# Patient Record
Sex: Male | Born: 1940 | Race: Black or African American | Hispanic: No | Marital: Married | Smoking: Former smoker
Health system: Southern US, Community
[De-identification: ages and names within clinical notes are randomized; demographics above are authoritative.]

## PROBLEM LIST (undated history)

## (undated) DIAGNOSIS — H269 Unspecified cataract: Secondary | ICD-10-CM

## (undated) DIAGNOSIS — D649 Anemia, unspecified: Secondary | ICD-10-CM

## (undated) DIAGNOSIS — R569 Unspecified convulsions: Secondary | ICD-10-CM

## (undated) DIAGNOSIS — D125 Benign neoplasm of sigmoid colon: Secondary | ICD-10-CM

## (undated) DIAGNOSIS — T7840XA Allergy, unspecified, initial encounter: Secondary | ICD-10-CM

## (undated) DIAGNOSIS — M1990 Unspecified osteoarthritis, unspecified site: Secondary | ICD-10-CM

## (undated) DIAGNOSIS — I10 Essential (primary) hypertension: Secondary | ICD-10-CM

## (undated) HISTORY — DX: Allergy, unspecified, initial encounter: T78.40XA

## (undated) HISTORY — PX: EYE SURGERY: SHX253

## (undated) HISTORY — DX: Unspecified convulsions: R56.9

## (undated) HISTORY — PX: GLAUCOMA REPAIR: SHX214

## (undated) HISTORY — DX: Benign neoplasm of sigmoid colon: D12.5

## (undated) HISTORY — DX: Unspecified cataract: H26.9

## (undated) HISTORY — DX: Anemia, unspecified: D64.9

## (undated) HISTORY — PX: CATARACT EXTRACTION, BILATERAL: SHX1313

## (undated) HISTORY — DX: Essential (primary) hypertension: I10

---

## 1997-08-07 ENCOUNTER — Ambulatory Visit (HOSPITAL_COMMUNITY): Admission: RE | Admit: 1997-08-07 | Discharge: 1997-08-07 | Payer: Self-pay | Admitting: *Deleted

## 2000-04-14 ENCOUNTER — Emergency Department (HOSPITAL_COMMUNITY): Admission: EM | Admit: 2000-04-14 | Discharge: 2000-04-14 | Payer: Self-pay | Admitting: Emergency Medicine

## 2003-07-06 ENCOUNTER — Emergency Department (HOSPITAL_COMMUNITY): Admission: EM | Admit: 2003-07-06 | Discharge: 2003-07-06 | Payer: Self-pay | Admitting: Family Medicine

## 2005-09-22 ENCOUNTER — Emergency Department (HOSPITAL_COMMUNITY): Admission: EM | Admit: 2005-09-22 | Discharge: 2005-09-22 | Payer: Self-pay | Admitting: Emergency Medicine

## 2006-05-03 ENCOUNTER — Emergency Department (HOSPITAL_COMMUNITY): Admission: EM | Admit: 2006-05-03 | Discharge: 2006-05-03 | Payer: Self-pay | Admitting: Emergency Medicine

## 2006-07-10 ENCOUNTER — Ambulatory Visit: Payer: Self-pay | Admitting: Gastroenterology

## 2006-07-24 ENCOUNTER — Encounter: Payer: Self-pay | Admitting: Gastroenterology

## 2006-07-24 ENCOUNTER — Ambulatory Visit: Payer: Self-pay | Admitting: Gastroenterology

## 2007-08-02 ENCOUNTER — Emergency Department (HOSPITAL_COMMUNITY): Admission: EM | Admit: 2007-08-02 | Discharge: 2007-08-02 | Payer: Self-pay | Admitting: Emergency Medicine

## 2009-01-08 ENCOUNTER — Encounter: Admission: RE | Admit: 2009-01-08 | Discharge: 2009-01-08 | Payer: Self-pay | Admitting: Internal Medicine

## 2010-01-24 ENCOUNTER — Observation Stay (HOSPITAL_COMMUNITY): Admission: EM | Admit: 2010-01-24 | Discharge: 2009-05-06 | Payer: Self-pay | Admitting: Emergency Medicine

## 2010-05-13 LAB — URINALYSIS, ROUTINE W REFLEX MICROSCOPIC
Bilirubin Urine: NEGATIVE
Glucose, UA: NEGATIVE mg/dL
Hgb urine dipstick: NEGATIVE
Ketones, ur: NEGATIVE mg/dL
Nitrite: NEGATIVE
Protein, ur: NEGATIVE mg/dL
Specific Gravity, Urine: 1.021 (ref 1.005–1.030)
Urobilinogen, UA: 0.2 mg/dL (ref 0.0–1.0)
pH: 5 (ref 5.0–8.0)

## 2010-05-13 LAB — COMPREHENSIVE METABOLIC PANEL
ALT: 12 U/L (ref 0–53)
AST: 19 U/L (ref 0–37)
Albumin: 2.9 g/dL — ABNORMAL LOW (ref 3.5–5.2)
Alkaline Phosphatase: 32 U/L — ABNORMAL LOW (ref 39–117)
BUN: 15 mg/dL (ref 6–23)
CO2: 27 mEq/L (ref 19–32)
Calcium: 8.4 mg/dL (ref 8.4–10.5)
Chloride: 104 mEq/L (ref 96–112)
Creatinine, Ser: 1.24 mg/dL (ref 0.4–1.5)
GFR calc Af Amer: >60 mL/min (ref >60)
GFR calc non Af Amer: 58 mL/min — ABNORMAL LOW (ref >60)
Glucose, Bld: 102 mg/dL — ABNORMAL HIGH (ref 70–99)
Potassium: 3.9 mEq/L (ref 3.5–5.1)
Sodium: 137 mEq/L (ref 135–145)
Total Bilirubin: 0.5 mg/dL (ref 0.3–1.2)
Total Protein: 6.5 g/dL (ref 6.0–8.3)

## 2010-05-13 LAB — BASIC METABOLIC PANEL
BUN: 22 mg/dL (ref 6–23)
CO2: 27 mEq/L (ref 19–32)
Calcium: 8.8 mg/dL (ref 8.4–10.5)
Chloride: 99 mEq/L (ref 96–112)
Creatinine, Ser: 1.4 mg/dL (ref 0.4–1.5)
GFR calc Af Amer: >60 mL/min (ref >60)
GFR calc non Af Amer: 51 mL/min — ABNORMAL LOW (ref >60)
Glucose, Bld: 96 mg/dL (ref 70–99)
Potassium: 3.4 mEq/L — ABNORMAL LOW (ref 3.5–5.1)
Sodium: 133 mEq/L — ABNORMAL LOW (ref 135–145)

## 2010-05-13 LAB — CBC
HCT: 35.1 % — ABNORMAL LOW (ref 39.0–52.0)
HCT: 35.2 % — ABNORMAL LOW (ref 39.0–52.0)
Hemoglobin: 11.3 g/dL — ABNORMAL LOW (ref 13.0–17.0)
Hemoglobin: 11.4 g/dL — ABNORMAL LOW (ref 13.0–17.0)
MCHC: 32.1 g/dL (ref 30.0–36.0)
MCHC: 32.4 g/dL (ref 30.0–36.0)
MCV: 83.1 fL (ref 78.0–100.0)
MCV: 83.5 fL (ref 78.0–100.0)
Platelets: 143 10*3/uL — ABNORMAL LOW (ref 150–400)
Platelets: 157 10*3/uL (ref 150–400)
RBC: 4.21 MIL/uL — ABNORMAL LOW (ref 4.22–5.81)
RBC: 4.23 MIL/uL (ref 4.22–5.81)
RDW: 14.4 % (ref 11.5–15.5)
RDW: 14.8 % (ref 11.5–15.5)
WBC: 11.7 10*3/uL — ABNORMAL HIGH (ref 4.0–10.5)
WBC: 6.1 10*3/uL (ref 4.0–10.5)

## 2010-05-13 LAB — URINE CULTURE: Colony Count: >=100000

## 2010-05-13 LAB — DIFFERENTIAL
Basophils Absolute: 0 10*3/uL (ref 0.0–0.1)
Basophils Relative: 0 % (ref 0–1)
Eosinophils Absolute: 0 10*3/uL (ref 0.0–0.7)
Eosinophils Relative: 0 % (ref 0–5)
Lymphocytes Relative: 4 % — ABNORMAL LOW (ref 12–46)
Lymphs Abs: 0.5 10*3/uL — ABNORMAL LOW (ref 0.7–4.0)
Monocytes Absolute: 1.1 10*3/uL — ABNORMAL HIGH (ref 0.1–1.0)
Monocytes Relative: 10 % (ref 3–12)
Neutro Abs: 10 10*3/uL — ABNORMAL HIGH (ref 1.7–7.7)
Neutrophils Relative %: 86 % — ABNORMAL HIGH (ref 43–77)

## 2010-05-13 LAB — URINE MICROSCOPIC-ADD ON

## 2010-05-13 LAB — IRON AND TIBC
Iron: 18 ug/dL — ABNORMAL LOW (ref 42–135)
Saturation Ratios: 9 % — ABNORMAL LOW (ref 20–55)
TIBC: 190 ug/dL — ABNORMAL LOW (ref 215–435)
UIBC: 172 ug/dL

## 2010-05-13 LAB — FERRITIN: Ferritin: 180 ng/mL (ref 22–322)

## 2010-05-13 LAB — VITAMIN B12: Vitamin B-12: 332 pg/mL (ref 211–911)

## 2010-05-13 LAB — RETICULOCYTES
RBC.: 4.34 MIL/uL (ref 4.22–5.81)
Retic Count, Absolute: 56.4 10*3/uL (ref 19.0–186.0)
Retic Ct Pct: 1.3 % (ref 0.4–3.1)

## 2010-05-13 LAB — TSH: TSH: 1.446 u[IU]/mL (ref 0.350–4.500)

## 2010-05-13 LAB — FOLATE: Folate: 12.5 ng/mL

## 2011-09-23 ENCOUNTER — Ambulatory Visit: Payer: Self-pay | Admitting: Internal Medicine

## 2011-10-15 ENCOUNTER — Encounter: Payer: Self-pay | Admitting: Gastroenterology

## 2011-10-22 ENCOUNTER — Ambulatory Visit (AMBULATORY_SURGERY_CENTER): Payer: No Typology Code available for payment source | Admitting: *Deleted

## 2011-10-22 VITALS — Ht 67.5 in | Wt 157.5 lb

## 2011-10-22 DIAGNOSIS — Z86010 Personal history of colonic polyps: Secondary | ICD-10-CM

## 2011-10-22 DIAGNOSIS — Z1211 Encounter for screening for malignant neoplasm of colon: Secondary | ICD-10-CM

## 2011-10-22 MED ORDER — MOVIPREP 100 G PO SOLR: ORAL | Status: DC

## 2011-11-05 ENCOUNTER — Ambulatory Visit (AMBULATORY_SURGERY_CENTER): Payer: No Typology Code available for payment source | Admitting: Gastroenterology

## 2011-11-05 ENCOUNTER — Encounter: Payer: Self-pay | Admitting: Gastroenterology

## 2011-11-05 VITALS — BP 118/78 | HR 54 | Temp 96.8°F | Resp 18 | Ht 67.5 in | Wt 157.0 lb

## 2011-11-05 DIAGNOSIS — Z1211 Encounter for screening for malignant neoplasm of colon: Secondary | ICD-10-CM

## 2011-11-05 DIAGNOSIS — Z86010 Personal history of colonic polyps: Secondary | ICD-10-CM

## 2011-11-05 DIAGNOSIS — D126 Benign neoplasm of colon, unspecified: Secondary | ICD-10-CM

## 2011-11-05 DIAGNOSIS — K635 Polyp of colon: Secondary | ICD-10-CM

## 2011-11-05 HISTORY — DX: Polyp of colon: K63.5

## 2011-11-05 MED ORDER — SODIUM CHLORIDE 0.9 % IV SOLN: 500.0000 mL | INTRAVENOUS | Status: DC

## 2011-11-05 NOTE — Op Note (Signed)
[facility] [address] [place], [address]   COLONOSCOPY PROCEDURE REPORT  PATIENT: Evan, Escobar  MR#: [id number] BIRTHDATE: Apr 04, 1940 , 71  yrs. old GENDER: Male ENDOSCOPIST: Mardella Layman, MD, Clementeen Graham REFERRED BY:  Della Goo, M.D. PROCEDURE DATE:  11/05/2011 PROCEDURE:   Colonoscopy with snare polypectomy ASA CLASS:   Class II INDICATIONS:patient's personal history of adenomatous colon polyps.  MEDICATIONS: propofol (Diprivan) 100mg  IV  DESCRIPTION OF PROCEDURE:   After the risks and benefits and of the procedure were explained, informed consent was obtained.  A digital rectal exam revealed no abnormalities of the rectum.    The LB [id number]  endoscope was introduced through the anus and advanced to the cecum, which was identified by both the appendix and ileocecal valve .  The quality of the prep was excellent, using MoviPrep .  The instrument was then slowly withdrawn as the colon was fully examined.     COLON FINDINGS: A flat polyp ranging between 3-23mm in size was found in the sigmoid colon.  A polypectomy was performed using snare cautery.  The resection was complete and the polyp tissue was completely retrieved.   The colon mucosa was otherwise normal. Retroflexed views revealed no abnormalities.     The scope was then withdrawn from the patient and the procedure completed.  COMPLICATIONS: There were no complications. ENDOSCOPIC IMPRESSION: 1.   Flat polyp ranging between 3-23mm in size was found in the sigmoid colon; polypectomy was performed using snare cautery 2.   The colon mucosa was otherwise normal  RECOMMENDATIONS: 1.  Repeat colonoscopy in 5 years if polyp adenomatous; otherwise 10 years 2.  continue current medications   REPEAT EXAM:  cc:  _______________________________ eSignedMardella Layman, MD, [facility] 11/05/2011 10:53 AM

## 2011-11-05 NOTE — Progress Notes (Signed)
Pt was given a urinal in the recovery room at his request. Mae  No complaints noted in the recovery room. Maw   Patient did not have preoperative order for IV antibiotic SSI prophylaxis. [phone]) Patient did not experience any of the following events: a burn prior to discharge; a fall within the facility; wrong site/side/patient/procedure/implant event; or a hospital transfer or hospital admission upon discharge from the facility. [phone])

## 2011-11-05 NOTE — Patient Instructions (Addendum)
Handout was given to your care partner on polyps.  You may resume your current medications today.  Please call if any questions or concerns.    YOU HAD AN ENDOSCOPIC PROCEDURE TODAY AT THE [facility]: Refer to the procedure report that was given to you for any specific questions about what was found during the examination.  If the procedure report does not answer your questions, please call your gastroenterologist to clarify.  If you requested that your care partner not be given the details of your procedure findings, then the procedure report has been included in a sealed envelope for you to review at your convenience later.  YOU SHOULD EXPECT: Some feelings of bloating in the abdomen. Passage of more gas than usual.  Walking can help get rid of the air that was put into your GI tract during the procedure and reduce the bloating. If you had a lower endoscopy (such as a colonoscopy or flexible sigmoidoscopy) you may notice spotting of blood in your stool or on the toilet paper. If you underwent a bowel prep for your procedure, then you may not have a normal bowel movement for a few days.  DIET: Your first meal following the procedure should be a light meal and then it is ok to progress to your normal diet.  A half-sandwich or bowl of soup is an example of a good first meal.  Heavy or fried foods are harder to digest and may make you feel nauseous or bloated.  Likewise meals heavy in dairy and vegetables can cause extra gas to form and this can also increase the bloating.  Drink plenty of fluids but you should avoid alcoholic beverages for 24 hours.  ACTIVITY: Your care partner should take you home directly after the procedure.  You should plan to take it easy, moving slowly for the rest of the day.  You can resume normal activity the day after the procedure however you should NOT DRIVE or use heavy machinery for 24 hours (because of the sedation medicines used during the test).    SYMPTOMS  TO REPORT IMMEDIATELY: A gastroenterologist can be reached at any hour.  During normal business hours, 8:30 AM to 5:00 PM Monday through Friday, call [phone].  After hours and on weekends, please call the GI answering service at [phone] who will take a message and have the physician on call contact you.   Following lower endoscopy (colonoscopy or flexible sigmoidoscopy):  Excessive amounts of blood in the stool  Significant tenderness or worsening of abdominal pains  Swelling of the abdomen that is new, acute  Fever of 100F or higher    FOLLOW UP: If any biopsies were taken you will be contacted by phone or by letter within the next 1-3 weeks.  Call your gastroenterologist if you have not heard about the biopsies in 3 weeks.  Our staff will call the home number listed on your records the next business day following your procedure to check on you and address any questions or concerns that you may have at that time regarding the information given to you following your procedure. This is a courtesy call and so if there is no answer at the home number and we have not heard from you through the emergency physician on call, we will assume that you have returned to your regular daily activities without incident.  SIGNATURES/CONFIDENTIALITY: You and/or your care partner have signed paperwork which will be entered into your electronic medical record.    These signatures attest to the fact that that the information above on your After Visit Summary has been reviewed and is understood.  Full responsibility of the confidentiality of this discharge information lies with you and/or your care-partner. 

## 2011-11-06 ENCOUNTER — Telehealth: Payer: Self-pay

## 2011-11-06 NOTE — Telephone Encounter (Signed)
No answer

## 2011-11-11 ENCOUNTER — Encounter: Payer: Self-pay | Admitting: Gastroenterology

## 2012-09-07 ENCOUNTER — Emergency Department (HOSPITAL_COMMUNITY)
Admission: EM | Admit: 2012-09-07 | Discharge: 2012-09-08 | Disposition: A | Discharge status: Home / Self Care | Payer: PRIVATE HEALTH INSURANCE | Attending: Emergency Medicine | Admitting: Emergency Medicine

## 2012-09-07 DIAGNOSIS — Z79899 Other long term (current) drug therapy: Secondary | ICD-10-CM | POA: Insufficient documentation

## 2012-09-07 DIAGNOSIS — Z87891 Personal history of nicotine dependence: Secondary | ICD-10-CM | POA: Insufficient documentation

## 2012-09-07 DIAGNOSIS — Z7982 Long term (current) use of aspirin: Secondary | ICD-10-CM | POA: Insufficient documentation

## 2012-09-07 DIAGNOSIS — D649 Anemia, unspecified: Secondary | ICD-10-CM | POA: Insufficient documentation

## 2012-09-07 DIAGNOSIS — I10 Essential (primary) hypertension: Secondary | ICD-10-CM | POA: Insufficient documentation

## 2012-09-07 DIAGNOSIS — R569 Unspecified convulsions: Secondary | ICD-10-CM | POA: Insufficient documentation

## 2012-09-07 DIAGNOSIS — Z8669 Personal history of other diseases of the nervous system and sense organs: Secondary | ICD-10-CM | POA: Insufficient documentation

## 2012-09-08 ENCOUNTER — Emergency Department (HOSPITAL_COMMUNITY): Payer: PRIVATE HEALTH INSURANCE

## 2012-09-08 ENCOUNTER — Encounter (HOSPITAL_COMMUNITY): Payer: Self-pay | Admitting: Emergency Medicine

## 2012-09-08 LAB — URINALYSIS, ROUTINE W REFLEX MICROSCOPIC
Bilirubin Urine: NEGATIVE
Glucose, UA: NEGATIVE mg/dL
Hgb urine dipstick: NEGATIVE
Ketones, ur: NEGATIVE mg/dL
Nitrite: NEGATIVE
Protein, ur: NEGATIVE mg/dL
Specific Gravity, Urine: 1.011 (ref 1.005–1.030)
Urobilinogen, UA: 0.2 mg/dL (ref 0.0–1.0)
pH: 6.5 (ref 5.0–8.0)

## 2012-09-08 LAB — COMPREHENSIVE METABOLIC PANEL WITH GFR
ALT: 9 U/L (ref 0–53)
AST: 16 U/L (ref 0–37)
Albumin: 3.3 g/dL — ABNORMAL LOW (ref 3.5–5.2)
Alkaline Phosphatase: 32 U/L — ABNORMAL LOW (ref 39–117)
BUN: 14 mg/dL (ref 6–23)
CO2: 25 meq/L (ref 19–32)
Calcium: 8.9 mg/dL (ref 8.4–10.5)
Chloride: 101 meq/L (ref 96–112)
Creatinine, Ser: 1.24 mg/dL (ref 0.50–1.35)
GFR calc Af Amer: 66 mL/min — ABNORMAL LOW
GFR calc non Af Amer: 57 mL/min — ABNORMAL LOW
Glucose, Bld: 132 mg/dL — ABNORMAL HIGH (ref 70–99)
Potassium: 3.8 meq/L (ref 3.5–5.1)
Sodium: 137 meq/L (ref 135–145)
Total Bilirubin: 0.2 mg/dL — ABNORMAL LOW (ref 0.3–1.2)
Total Protein: 6.8 g/dL (ref 6.0–8.3)

## 2012-09-08 LAB — CBC WITH DIFFERENTIAL/PLATELET
Basophils Absolute: 0 10*3/uL (ref 0.0–0.1)
Basophils Relative: 0 % (ref 0–1)
Eosinophils Absolute: 0.2 10*3/uL (ref 0.0–0.7)
Eosinophils Relative: 3 % (ref 0–5)
HCT: 33.2 % — ABNORMAL LOW (ref 39.0–52.0)
Hemoglobin: 11.1 g/dL — ABNORMAL LOW (ref 13.0–17.0)
Lymphocytes Relative: 30 % (ref 12–46)
Lymphs Abs: 1.7 10*3/uL (ref 0.7–4.0)
MCH: 27.3 pg (ref 26.0–34.0)
MCHC: 33.4 g/dL (ref 30.0–36.0)
MCV: 81.8 fL (ref 78.0–100.0)
Monocytes Absolute: 0.7 10*3/uL (ref 0.1–1.0)
Monocytes Relative: 12 % (ref 3–12)
Neutro Abs: 3.2 10*3/uL (ref 1.7–7.7)
Neutrophils Relative %: 56 % (ref 43–77)
Platelets: 157 10*3/uL (ref 150–400)
RBC: 4.06 MIL/uL — ABNORMAL LOW (ref 4.22–5.81)
RDW: 13.5 % (ref 11.5–15.5)
WBC: 5.8 10*3/uL (ref 4.0–10.5)

## 2012-09-08 LAB — URINE MICROSCOPIC-ADD ON

## 2012-09-08 LAB — CG4 I-STAT (LACTIC ACID): Lactic Acid, Venous: 4.82 mmol/L — ABNORMAL HIGH (ref 0.5–2.2)

## 2012-09-08 LAB — CK: Total CK: 100 U/L (ref 7–232)

## 2012-09-08 NOTE — ED Notes (Signed)
[facility] presents with a 72 yo male from home with seizure like activity reported by wife.  Wife woke up to husband twitching and drool with seizure like movements in bed.  Wife called [facility].  No prior hx of seizure. Pt being treated currently for kidney infections
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
## 2012-09-08 NOTE — ED Notes (Signed)
I stat lactic acid results given to Dr. Glick by B. Vernis Eid, EMT 

## 2012-09-10 LAB — URINE CULTURE: Colony Count: 75000

## 2012-09-11 ENCOUNTER — Telehealth (HOSPITAL_COMMUNITY): Payer: Self-pay | Admitting: Emergency Medicine

## 2012-09-11 NOTE — Progress Notes (Signed)
ED Antimicrobial Stewardship Positive Culture Follow Up   Elbie Statzer is an 72 y.o. male who presented to [facility] on 09/07/2012 with a chief complaint of  Chief Complaint  Patient presents with  . Seizures    Recent Results (from the past 720 hour(s))  URINE CULTURE     Status: None   Collection Time    09/08/12 12:30 AM      Result Value Range Status   Specimen Description URINE, CLEAN CATCH   Final   Special Requests NONE   Final   Culture  Setup Time 09/08/2012 01:40   Final   Colony Count 75,000 COLONIES/ML   Final   Culture ESCHERICHIA COLI   Final   Report Status 09/10/2012 FINAL   Final   Organism ID, Bacteria ESCHERICHIA COLI   Final    [x]  Treated with ciprofloxacin, organism resistant to prescribed antimicrobial []  Patient discharged originally without antimicrobial agent and treatment is now indicated  New antibiotic prescription: Keflex 500mg  BID x 7 days  ED Provider: R[facility]   Mickeal Skinner 09/11/2012, 4:47 PM Infectious Diseases Pharmacist Phone# [phone]

## 2012-09-11 NOTE — ED Notes (Signed)
Post ED Visit - Positive Culture Follow-up: Successful Patient Follow-Up  Culture assessed and recommendations reviewed by: []  Wes Dulaney, Pharm.D., BCPS [x]  Celedonio Miyamoto, Pharm.D., BCPS []  Georgina Pillion, Pharm.D., BCPS []  [place], [address].D., BCPS, AAHIVP []  Estella Husk, Pharm.D., BCPS, AAHIVP  Positive urine culture  []  Patient discharged without antimicrobial prescription and treatment is now indicated [x]  Organism is resistant to prescribed ED discharge antimicrobial []  Patient with positive blood cultures  Changes discussed with ED provider: Roxy Horseman PA-C New antibiotic prescription: Cephalexin 500 mg BID x 7 days    Evan Escobar 09/11/2012, 5:15 PM

## 2012-09-12 ENCOUNTER — Telehealth (HOSPITAL_COMMUNITY): Payer: Self-pay | Admitting: Emergency Medicine

## 2012-09-23 ENCOUNTER — Telehealth: Payer: Self-pay | Admitting: Gastroenterology

## 2012-09-24 ENCOUNTER — Encounter: Payer: Self-pay | Admitting: *Deleted

## 2012-09-24 ENCOUNTER — Ambulatory Visit (INDEPENDENT_AMBULATORY_CARE_PROVIDER_SITE_OTHER): Payer: No Typology Code available for payment source | Admitting: Nurse Practitioner

## 2012-09-24 ENCOUNTER — Other Ambulatory Visit (INDEPENDENT_AMBULATORY_CARE_PROVIDER_SITE_OTHER): Payer: No Typology Code available for payment source

## 2012-09-24 VITALS — BP 130/70 | HR 70 | Ht 66.25 in | Wt 154.4 lb

## 2012-09-24 DIAGNOSIS — D649 Anemia, unspecified: Secondary | ICD-10-CM

## 2012-09-24 LAB — FERRITIN: Ferritin: 81.3 ng/mL (ref 22.0–322.0)

## 2012-09-24 LAB — IBC PANEL
Iron: 64 ug/dL (ref 42–165)
Saturation Ratios: 24.3 % (ref 20.0–50.0)
Transferrin: 187.8 mg/dL — ABNORMAL LOW (ref 212.0–360.0)

## 2012-09-24 NOTE — Telephone Encounter (Signed)
Pt reports Dr Lovell Sheehan want him seen for anemia; he ofc is closed today. I have no record of latest labs, but July labs show HGB consistently around 11. Pt will see Willette Cluster, NP today.

## 2012-09-24 NOTE — Patient Instructions (Addendum)
Please go to the basement level to have your labs drawn.  We will call you with the results and discuss possible further treatment.

## 2012-09-24 NOTE — Progress Notes (Signed)
HPI :  Patient is a 72 year old male known to Dr. Jarold Motto. He has history of adenomatous colon polyps. Last surveillance colonoscopy was September 2013. The prep was excellent, extent of the exam was to the cecum. A flat sigmoid polyp was removed and pathology compatible with adenoma.  Patient is referred for evaluation of anemia. Patient had a seizure and was evaluated in emergency department late July. Labs at that time revealed a hemoglobin of 11.1. Patient's hemoglobin has ranged between 11.1 and 11.4 since March 2011 so hgb stable. No overt bleeding. No significant weight loss. No bowel changes. He takes a baby aspirin, no other NSAIDs.  Past Medical History  Diagnosis Date  . Hypertension   . Glaucoma   . Polyp, sigmoid colon 11/05/2011    Family History  Problem Relation Age of Onset  . Colon cancer Neg Hx    History  Substance Use Topics  . Smoking status: Former Smoker    Quit date: 02/18/2007  . Smokeless tobacco: Never Used  . Alcohol Use: No     Comment: quit in 2009   Current Outpatient Prescriptions  Medication Sig Dispense Refill  . aspirin 81 MG tablet Take 81 mg by mouth daily.      Marland Kitchen lisinopril (PRINIVIL,ZESTRIL) 20 MG tablet Take 1 tablet by mouth Daily.      . metoprolol succinate (TOPROL-XL) 25 MG 24 hr tablet Take 1 tablet by mouth At bedtime.      . Multiple Vitamin (MULTIVITAMIN) tablet Take 1 tablet by mouth daily.      . tamsulosin (FLOMAX) 0.4 MG CAPS Take 0.4 mg by mouth daily.       No current facility-administered medications for this visit.   No Known Allergies  Physical Exam: BP 130/70  Pulse 70  Ht 5' 6.25" (1.683 m)  Wt 154 lb 6.4 oz (70.035 kg)  BMI 24.73 kg/m2 Constitutional: Pleasant,well-developed, black male in no acute distress. HEENT: Normocephalic and atraumatic. Conjunctivae are normal. No scleral icterus. Neck supple.  Cardiovascular: Normal rate, regular rhythm.  Pulmonary/chest: Effort normal and breath sounds normal. No  wheezing, rales or rhonchi. Abdominal: Soft, nondistended, nontender. Bowel sounds active throughout. There are no masses palpable. No hepatomegaly. Rectal: light brown, heme negative stool on exam Extremities: no edema Lymphadenopathy: No cervical adenopathy noted. Neurological: Alert and oriented to person place and time. Skin: Skin is warm and dry. No rashes noted. Psychiatric: Normal mood and affect. Behavior is normal.  ASSESSMENT AND PLAN:  72 year old male here for evaluation of anemia. Unfortunately I do not have records from PCP but recent emergency department visit labs (visit for seizure) revealed hemoglobin of 11.1. His hemoglobin has ranged from 11.1-11.4 since March 2011. By records available to me, patient's hgb is stable. He hasn't had any overt bleeding. Patient is heme-negative. No GI complaints. Will check iron studies today. Will await records from PCP and contact patient should there be any further recommendations regarding anemia workup. Marland Kitchen

## 2012-09-25 DIAGNOSIS — D649 Anemia, unspecified: Secondary | ICD-10-CM | POA: Insufficient documentation

## 2012-09-25 HISTORY — DX: Anemia, unspecified: D64.9

## 2012-10-12 ENCOUNTER — Encounter: Payer: Self-pay | Admitting: Neurology

## 2012-10-13 ENCOUNTER — Ambulatory Visit (INDEPENDENT_AMBULATORY_CARE_PROVIDER_SITE_OTHER): Payer: No Typology Code available for payment source | Admitting: Neurology

## 2012-10-13 ENCOUNTER — Encounter: Payer: Self-pay | Admitting: Neurology

## 2012-10-13 VITALS — BP 131/71 | HR 73 | Ht 66.0 in | Wt 155.0 lb

## 2012-10-13 DIAGNOSIS — R569 Unspecified convulsions: Secondary | ICD-10-CM

## 2012-10-13 MED ORDER — LEVETIRACETAM 500 MG PO TABS: 500.0000 mg | ORAL_TABLET | Freq: Two times a day (BID) | ORAL | Status: DC

## 2012-10-13 NOTE — Patient Instructions (Addendum)
Overall you are doing fairly well but I do want to suggest a few things today:   As far as your medications are concerned, I would like to suggest starting a medication called Keppra 500mg . Start by taking 1 tablet twice a day.   As far as diagnostic testing: I would like to order a MRI of the brain and a EEG  I would like to see you back in 4 to 6 months, sooner if we need to. Please call us with any interim questions, concerns, problems, updates or refill requests.   No driving for 6 months. Please follow up with the DMV  Please also call us for any test results so we can go over those with you on the phone.  My clinical assistant and will answer any of your questions and relay your messages to me and also relay most of my messages to you.   Our phone number is [phone]. We also have an after hours call service for urgent matters and there is a physician on-call for urgent questions. For any emergencies you know to call 911 or go to the nearest emergency room

## 2012-10-13 NOTE — Progress Notes (Addendum)
[facility]  Provider:  Dr Hosie Poisson Referring Provider: Ron Parker, MD Primary Care Physician:  Ron Parker, MD  CC:  Seizure   HPI:  Evan Escobar is a 72 y.o. male here as a referral from Dr. Lovell Sheehan for question of possible seizure  Are one month ago he remembers being in bed the next thing he remembers is waking up in the ER. He has no other recollection of the event. Reports the day of the normal day. Denies any aura or warning prior to the event. Per his wife who presents with a history he was having generalized tonic-clonic movements of all extremities, eyes rolled back, foaming at the mouth with a groaning moaning sound. She feels it lasted around 5-10 minutes of shaking, he was confused after for around one hour before returning to baseline. Denies any tongue biting wife notes some urinary incontinence. Has had no prior episodes similar to this. Both patient and his wife to describe a few prior episodes of the past year where he would get worse sensation in his left arm and then would have staring episodes and zone out. There is episodes he would not talk or of nonsensical speech. His last a few minutes and then he returned to baseline. Otherwise unremarkable past medical history. Denies any head,, history of cancer. No prior history of seizure no family history of seizure no febrile seizures. Saw a cardiologist for workup and told everything was fine from a cardiac standpoint.  Quit smoking years ago, was a light smoker. History of EtOH use but doesn't drink currently.  Head CT from 08/2012 images reviewed and found to be unremarkable.  Review of Systems: Out of a complete 14 system review, the patient complains of only the following symptoms, and all other reviewed systems are negative.  other for eye pain and none of sleep  History   Social History  . Marital Status: Married    Spouse Name: Kara Mead    Number of Children: 1  . Years of Education: 11    Occupational History  . retired    Social History Main Topics  . Smoking status: Former Smoker    Types: Cigarettes    Quit date: 02/18/2007  . Smokeless tobacco: Never Used  . Alcohol Use: No     Comment: quit in 2009  . Drug Use: No  . Sexual Activity: Not on file   Other Topics Concern  . Not on file   Social History Narrative   Patient lives at home with his wife Kara Mead). Patient is retired. Patient has 11 th grade education.    Caffeine- one cup of coffee daily and one soda.   Right handed.    Family History  Problem Relation Age of Onset  . Colon cancer Neg Hx     Past Medical History  Diagnosis Date  . Hypertension   . Glaucoma   . Polyp, sigmoid colon 11/05/2011  . Seizure     Past Surgical History  Procedure Laterality Date  . Glaucoma repair  10 yrs ago  . Cataract extraction, bilateral      Current Outpatient Prescriptions  Medication Sig Dispense Refill  . aspirin 81 MG tablet Take 81 mg by mouth daily.      Marland Kitchen lisinopril (PRINIVIL,ZESTRIL) 20 MG tablet Take 1 tablet by mouth Daily.      . metoprolol succinate (TOPROL-XL) 25 MG 24 hr tablet Take 1 tablet by mouth At bedtime.      . Multiple Vitamin (  MULTIVITAMIN) tablet Take 1 tablet by mouth daily.      . tamsulosin (FLOMAX) 0.4 MG CAPS Take 0.4 mg by mouth daily.       No current facility-administered medications for this visit.    Allergies as of 10/13/2012  . (No Known Allergies)    Vitals: BP 131/71  Pulse 73  Ht 5\' 6"  (1.676 m)  Wt 155 lb (70.308 kg)  BMI 25.03 kg/m2 Last Weight:  Wt Readings from Last 1 Encounters:  10/13/12 155 lb (70.308 kg)   Last Height:   Ht Readings from Last 1 Encounters:  10/13/12 5\' 6"  (1.676 m)     Physical exam: Exam: Gen: NAD, conversant Eyes: anicteric sclerae, moist conjunctivae HENT: Atraumatic Neck: Trachea midline; supple,  Lungs: CTA, no wheezing, rales, rhonic                          CV: RRR, no MRG Abdomen: Soft, non-tender;   Extremities: No peripheral edema  Skin: Normal temperature, no rash,  Psych: Appropriate affect, pleasant  Neuro: MS: AA&Ox3, appropriately interactive, normal affect   Speech: fluent w/o paraphasic error  Memory: good recent and remote recall  CN: PERRL, EOMI no nystagmus, no ptosis, sensation intact to LT V1-V3 bilat, face symmetric, no weakness, hearing grossly intact, palate elevates symmetrically, shoulder shrug 5/5 bilat,  tongue protrudes midline, no fasiculations noted.  Motor: normal bulk and tone Strength: 5/5  In all extremities  Coord: rapid alternating and point-to-point (FNF, HTS) movements intact.  Reflexes: symmetrical, bilat downgoing toes  Sens: LT intact in all extremities  Gait: posture, stance, stride and arm-swing normal. Tandem gait intact. Able to walk on heels and toes. Romberg absent.   Assessment:  After physical and neurologic examination, review of laboratory studies, imaging, neurophysiology testing and pre-existing records, assessment will be reviewed on the problem list.  Plan:  Treatment plan and additional workup will be reviewed under Problem List.  Mr. doty is a pleasant 72 year old gentleman who presents for initial evaluation of possible seizure. Episode described appears consistent with a generalized tonic-clonic seizure. He also notes some episodes that appear concerning for partial complex seizure. Denies any history of head trauma, any cancer history. His physical exam is unremarkable and neurological exam is nonfocal. Says seizures and a 72 year old raises concern for possible underlying structural lesion. Therefore will plan for MRI of the brain with and without contrast, and EEG. Based on what sounds like multiple partial seizures and one generalized tonic-clonic seizure will start patient on medication. Discussed different options with patient and wife and we'll start Keppra 500 mg twice a day. Can titrate up in the future as  needed.  1) Seizures -MRI of the brain with and without contrast -EEG -Start Keppra 500 mg twice a day -Patient counseled on not driving for minimum of 6 months, avoiding situations where he could be a danger such as swimming alone. -Followup in 4-6 months or earlier as needed  I have read the note, and I agree with the clinical assessment and plan.  Lesly Dukes

## 2012-10-25 ENCOUNTER — Ambulatory Visit
Admission: RE | Admit: 2012-10-25 | Discharge: 2012-10-25 | Disposition: A | Discharge status: Home / Self Care | Payer: PRIVATE HEALTH INSURANCE | Source: Ambulatory Visit | Attending: Neurology | Admitting: Neurology

## 2012-10-25 DIAGNOSIS — R569 Unspecified convulsions: Secondary | ICD-10-CM

## 2012-10-25 MED ORDER — GADOBENATE DIMEGLUMINE 529 MG/ML IV SOLN
14.0000 mL | Freq: Once | INTRAVENOUS | Status: AC | PRN
  Administered 2012-10-25: 14 mL via INTRAVENOUS

## 2012-10-27 ENCOUNTER — Ambulatory Visit (INDEPENDENT_AMBULATORY_CARE_PROVIDER_SITE_OTHER): Payer: No Typology Code available for payment source | Admitting: Radiology

## 2012-10-27 DIAGNOSIS — R569 Unspecified convulsions: Secondary | ICD-10-CM

## 2012-11-04 NOTE — Procedures (Signed)
   [facility]  EEG (ELECTROENCEPHALOGRAM) REPORT   STUDY DATE: 10/27/12 PATIENT NAME: Evan Escobar DOB: [birth year] MRN: [id number]  ORDERING CLINICIAN: Elspeth Cho, DO   TECHNOLOGIST: Kaylyn Lim TECHNIQUE: Electroencephalogram was recorded utilizing standard 10-20 system of lead placement and reformatted into average and bipolar montages.  RECORDING TIME: 25 minutes ACTIVATION: photic stimulation  CLINICAL INFORMATION: 72 year old male with episodes of seizure.  FINDINGS: Intermixed muscle artifact noted in this study. Background rhythms of 8-9 hertz and 15-20 microvolts. No focal, lateralizing, epileptiform activity or seizures are seen. Patient recorded in the awake and drowsy states.   IMPRESSION:  Normal EEG in the awake and drowsy states.    INTERPRETING PHYSICIAN:  Suanne Marker, MD Certified in Neurology, Neurophysiology and Neuroimaging  [facility] [address] [place], [place] [address] [phone]

## 2013-02-18 ENCOUNTER — Other Ambulatory Visit: Payer: Self-pay | Admitting: Neurology

## 2013-03-16 ENCOUNTER — Ambulatory Visit: Payer: No Typology Code available for payment source | Admitting: Neurology

## 2013-03-17 ENCOUNTER — Ambulatory Visit (INDEPENDENT_AMBULATORY_CARE_PROVIDER_SITE_OTHER): Payer: Medicare HMO | Admitting: Neurology

## 2013-03-17 ENCOUNTER — Encounter: Payer: Self-pay | Admitting: Neurology

## 2013-03-17 ENCOUNTER — Encounter (INDEPENDENT_AMBULATORY_CARE_PROVIDER_SITE_OTHER): Payer: Self-pay

## 2013-03-17 VITALS — BP 136/75 | HR 71 | Ht 67.5 in | Wt 160.0 lb

## 2013-03-17 DIAGNOSIS — R569 Unspecified convulsions: Secondary | ICD-10-CM

## 2013-03-17 HISTORY — DX: Unspecified convulsions: R56.9

## 2013-03-17 MED ORDER — LEVETIRACETAM 500 MG PO TABS: 500.0000 mg | ORAL_TABLET | Freq: Two times a day (BID) | ORAL | Status: DC

## 2013-03-17 NOTE — Patient Instructions (Addendum)
Overall you are doing fairly well but I do want to suggest a few things today:   Remember to drink plenty of fluid, eat healthy meals and do not skip any meals. Try to eat protein with a every meal and eat a healthy snack such as fruit or nuts in between meals. Try to keep a regular sleep-wake schedule and try to exercise daily, particularly in the form of walking, 20-30 minutes a day, if you can.   As far as your medications are concerned, I would like to suggest you continue on the keppra 500mg  twice a day.   I would like to see you back in 6 months, sooner if we need to. Please call us with any interim questions, concerns, problems, updates or refill requests.   My clinical assistant and will answer any of your questions and relay your messages to me and also relay most of my messages to you.   Our phone number is [phone]. We also have an after hours call service for urgent matters and there is a physician on-call for urgent questions. For any emergencies you know to call 911 or go to the nearest emergency room

## 2013-03-17 NOTE — Progress Notes (Signed)
[facility]  Provider:  Dr Janann Colonel Referring Provider: Theressa Millard, MD Primary Care Physician:  Theressa Millard, MD  CC:  Seizure   HPI:  Drevion Pinales is a 73 y.o. male here as a follow up from Dr. Arnoldo Morale for question of possible seizure. Based on history and concern for seizure he was started on Keppra 500mg  BID at his last visit.Marland Kitchen Has been tolerating the keppra well, no adverse effects. Patient denies any further seizure events. Per patient, wife has not noted any staring off episodes. No gait instability, no dizziness. He notes that he walks/exercises on a regular basis. He has been driving. No other acute concerns at this point.    Initial visit 09/2012: Are one month ago he remembers being in bed the next thing he remembers is waking up in the ER. He has no other recollection of the event. Reports the day of the normal day. Denies any aura or warning prior to the event. Per his wife who presents with a history he was having generalized tonic-clonic movements of all extremities, eyes rolled back, foaming at the mouth with a groaning moaning sound. She feels it lasted around 5-10 minutes of shaking, he was confused after for around one hour before returning to baseline. Denies any tongue biting wife notes some urinary incontinence. Has had no prior episodes similar to this. Both patient and his wife to describe a few prior episodes of the past year where he would get worse sensation in his left arm and then would have staring episodes and zone out. There is episodes he would not talk or of nonsensical speech. His last a few minutes and then he returned to baseline. Otherwise unremarkable past medical history. Denies any head, history of cancer. No prior history of seizure no family history of seizure no febrile seizures. Saw a cardiologist for workup and told everything was fine from a cardiac standpoint.  Quit smoking years ago, was a light smoker. History of EtOH use  but doesn't drink currently.  Head CT from 08/2012 images reviewed and found to be unremarkable.  Review of Systems: Out of a complete 14 system review, the patient complains of only the following symptoms, and all other reviewed systems are negative.  Positive for light sensitivity  History   Social History  . Marital Status: Married    Spouse Name: Terrence Dupont    Number of Children: 1  . Years of Education: 11   Occupational History  . retired    Social History Main Topics  . Smoking status: Former Smoker -- 1.00 packs/day    Types: Cigarettes    Quit date: 02/18/2007  . Smokeless tobacco: Never Used  . Alcohol Use: No     Comment: quit in 2009  . Drug Use: No  . Sexual Activity: Not on file   Other Topics Concern  . Not on file   Social History Narrative   Patient lives at home with his wife Terrence Dupont). Patient is retired. Patient has 11 th grade education.    Caffeine- one cup of coffee daily and one soda.   Right handed.    Family History  Problem Relation Age of Onset  . Colon cancer Neg Hx   . Stroke Mother   . Stroke Father   . High blood pressure Mother   . High blood pressure Father     Past Medical History  Diagnosis Date  . Hypertension   . Glaucoma   . Polyp, sigmoid colon 11/05/2011  .  Seizure     Past Surgical History  Procedure Laterality Date  . Glaucoma repair  10 yrs ago  . Cataract extraction, bilateral      Current Outpatient Prescriptions  Medication Sig Dispense Refill  . aspirin 81 MG tablet Take 81 mg by mouth daily.      . ciprofloxacin (CIPRO) 500 MG tablet Take 500 mg by mouth daily.      Marland Kitchen latanoprost (XALATAN) 0.005 % ophthalmic solution       . levETIRAcetam (KEPPRA) 500 MG tablet Take 1 tablet (500 mg total) by mouth 2 (two) times daily.  60 tablet  3  . lisinopril (PRINIVIL,ZESTRIL) 20 MG tablet Take 1 tablet by mouth Daily.      . metoprolol succinate (TOPROL-XL) 25 MG 24 hr tablet Take 1 tablet by mouth At bedtime.      .  Multiple Vitamin (MULTIVITAMIN) tablet Take 1 tablet by mouth daily.      . tamsulosin (FLOMAX) 0.4 MG CAPS Take 0.4 mg by mouth daily.       No current facility-administered medications for this visit.    Allergies as of 03/17/2013  . (No Known Allergies)    Vitals: BP 136/75  Pulse 71  Ht 5' 7.5" (1.715 m)  Wt 160 lb (72.576 kg)  BMI 24.68 kg/m2 Last Weight:  Wt Readings from Last 1 Encounters:  03/17/13 160 lb (72.576 kg)   Last Height:   Ht Readings from Last 1 Encounters:  03/17/13 5' 7.5" (1.715 m)     Physical exam: Exam: Gen: NAD, conversant Eyes: anicteric sclerae, moist conjunctivae HENT: Atraumatic Neck: Trachea midline; supple,  Lungs: CTA, no wheezing, rales, rhonic                          CV: RRR, no MRG Abdomen: Soft, non-tender;  Extremities: No peripheral edema  Skin: Normal temperature, no rash,  Psych: Appropriate affect, pleasant  Neuro: MS: AA&Ox3, appropriately interactive, normal affect   Speech: fluent w/o paraphasic error  Memory: good recent and remote recall  CN: PERRL, EOMI no nystagmus, no ptosis, sensation intact to LT V1-V3 bilat, face symmetric, no weakness, hearing grossly intact, palate elevates symmetrically, shoulder shrug 5/5 bilat,  tongue protrudes midline, no fasiculations noted.  Motor: normal bulk and tone Strength: 5/5  In all extremities  Coord: rapid alternating and point-to-point (FNF, HTS) movements intact.  Reflexes: symmetrical, bilat downgoing toes  Sens: LT intact in all extremities  Gait: posture, stance, stride and arm-swing normal. Tandem gait intact. Able to walk on heels and toes. Romberg absent.   Assessment:  After physical and neurologic examination, review of laboratory studies, imaging, neurophysiology testing and pre-existing records, assessment will be reviewed on the problem list.  Plan:  Treatment plan and additional workup will be reviewed under Problem List.  1)Seizures  Mr.  Nocera is a pleasant 73 year old gentleman who presents for follow up evaluation of possible seizure disorder. Episode described appears consistent with a generalized tonic-clonic seizure. He also notes some episodes that appear concerning for partial complex seizure. At last visit was started on Keppra 500mg  BID. Tolerating medication well, no further seizure episodes noted. Will continue on keppra 500mg  BID at this point, follow up in 6 months. If he continues to be seizure free at that point will consider trying to taper off Keppra. Patient counseled to avoid driving for minimum of 6 months (it has been 5 months since last event).  Khani Paino, JUSTIN

## 2013-05-06 ENCOUNTER — Encounter: Payer: Self-pay | Admitting: Physician Assistant

## 2013-05-06 ENCOUNTER — Ambulatory Visit (INDEPENDENT_AMBULATORY_CARE_PROVIDER_SITE_OTHER): Payer: Managed Care, Other (non HMO) | Admitting: Physician Assistant

## 2013-05-06 VITALS — BP 122/82 | HR 56 | Temp 97.4°F | Ht 67.0 in | Wt 158.8 lb

## 2013-05-06 DIAGNOSIS — I10 Essential (primary) hypertension: Secondary | ICD-10-CM

## 2013-05-06 DIAGNOSIS — H6120 Impacted cerumen, unspecified ear: Secondary | ICD-10-CM

## 2013-05-06 DIAGNOSIS — Z0000 Encounter for general adult medical examination without abnormal findings: Secondary | ICD-10-CM

## 2013-05-06 DIAGNOSIS — R569 Unspecified convulsions: Secondary | ICD-10-CM

## 2013-05-06 DIAGNOSIS — H409 Unspecified glaucoma: Secondary | ICD-10-CM

## 2013-05-06 NOTE — Progress Notes (Signed)
Patient ID: Evan Escobar is a 73 y.o. male DOB: [phone] MRN: [id number]     HPI:  Patient is a 73 year old male who presents to the office to establish care. He is accompanied by his wife. Hs not been seen by a PCP in 6 months. Reports a health history of HTN treated with Lisinopril and Metoprolol. Has a history of recent seizure and follows with Neurology, Dr. Janann Colonel. Last saw neuro in 1/15 is to return in 6 months. Seizures controlled with Levetiracetam. Was treated for BPH and placed on Flomax 0.4 mg once daily. Also treated for Glaucoma with Xalatan. Has no other complaints. Denies chest pain/palpitations, SOB, cough, N/V/F, change in bowel/bladder habits, nocturia, difficulty urinating, hematuria, lightheaded dizzy or weakness.    Influenza: 10/14 Pneumonia: 4097 Tetanus: uncertain Eye Dr. 3/53 Dentist: dentures Colonoscopy: 9/13  ROS: As stated in HPI. All other systems negative  Past Medical History  Diagnosis Date  . Hypertension   . Glaucoma   . Polyp, sigmoid colon 11/05/2011  . Seizure   . Normocytic anemia 09/25/2012  . Seizures 03/17/2013   Family History  Problem Relation Age of Onset  . Colon cancer Neg Hx   . Stroke Mother   . Stroke Father   . High blood pressure Mother   . High blood pressure Father    History   Social History  . Marital Status: Married    Spouse Name: Terrence Dupont    Number of Children: 1  . Years of Education: 11   Occupational History  . retired    Social History Main Topics  . Smoking status: Former Smoker -- 1.00 packs/day    Types: Cigarettes    Quit date: 02/18/2007  . Smokeless tobacco: Never Used  . Alcohol Use: No     Comment: quit in 2009  . Drug Use: No  . Sexual Activity: None   Other Topics Concern  . None   Social History Narrative   Patient lives at home with his wife Terrence Dupont). Patient is retired. Patient has 11 th grade education.    Caffeine- one cup of coffee daily and one soda.   Right handed.   Past  Surgical History  Procedure Laterality Date  . Glaucoma repair  10 yrs ago  . Cataract extraction, bilateral     Current Outpatient Prescriptions on File Prior to Visit  Medication Sig Dispense Refill  . aspirin 81 MG tablet Take 81 mg by mouth daily.      Marland Kitchen latanoprost (XALATAN) 0.005 % ophthalmic solution       . levETIRAcetam (KEPPRA) 500 MG tablet Take 1 tablet (500 mg total) by mouth 2 (two) times daily.  60 tablet  3  . lisinopril (PRINIVIL,ZESTRIL) 20 MG tablet Take 1 tablet by mouth Daily.      . metoprolol succinate (TOPROL-XL) 25 MG 24 hr tablet Take 1 tablet by mouth At bedtime.      . Multiple Vitamin (MULTIVITAMIN) tablet Take 1 tablet by mouth daily.      . tamsulosin (FLOMAX) 0.4 MG CAPS Take 0.4 mg by mouth daily.       No current facility-administered medications on file prior to visit.   No Known Allergies  PE:  Filed Vitals:   05/06/13 1029  BP: 122/82  Pulse: 56  Temp: 97.4 F (36.3 C)    CONSTITUTIONAL: Well developed, well nourished, pleasant, appears stated age, in NAD HEENT: normocephalic, atraumatic, cerumen present in bilateral canals, removal attempted with loop, right  ear successful, left ear requires lavage. S/p cerumen removal bilateral ext/int canals normal. Bilateral TM's without injections, bulging, erythema. Nose normal, uvula midline, oropharynx clear and moist. EYES: PERRLA, bilateral EOM and conjunctiva normal NECK: FROM, supple, without thyromegaly or mass, no carotid bruits CARDIO: RRR, normal S1 and S2, distal pulses intact. PULM/CHEST CTA bilateral, no wheezes, rales or rhonchi. Non tender. ABD: appearance normal, soft, nontender. Normal bowel sounds x 4 quadrants, no HSM GU: Prostate symmetrical, mildly enlarged, non tender, without palpable nodules. Exam chaperoned by Tomma Lightning, CMA. Guaiac negative. MUSC: FROM U/LE bilateral LYMPH: no cervical, supraclavicular adenopathy NEURO: alert and oriented x 3, no cranial nerve deficit, motor  strength and coordination NL. DTR's intact. Negative romberg. Gait normal. SKIN: warm, dry, no rash or lesions noted. PSYCH: Mood and affect normal, speech normal.   Lab Results  Component Value Date   WBC 5.8 09/08/2012   HGB 11.1* 09/08/2012   HCT 33.2* 09/08/2012   PLT 157 09/08/2012   GLUCOSE 132* 09/08/2012   ALT 9 09/08/2012   AST 16 09/08/2012   NA 137 09/08/2012   K 3.8 09/08/2012   CL 101 09/08/2012   CREATININE 1.24 09/08/2012   BUN 14 09/08/2012   CO2 25 09/08/2012   TSH  Value: 1.446 (NOTE)  Please note change in reference ranges for ages 69W to 57Y. Test methodology is 3rd generation TSH 05/06/2009   Reviewed with patient no risk of falls, is able to ambulate independently, able to dress and bath self without assistance. There are no weapons in the house. Does not eat out often, has good eating habits, wife does all the cooking.   ASSESSMENT and PLAN   CPX/v70.0 - Patient has been counseled on age-appropriate routine health concerns for screening and prevention. These are reviewed and up-to-date. Immunizations are up-to-date or declined. Labs ordered and will be reviewed.  HTN Treated with Lisinopril and Metoprolol.   Seizure, history of Keep appointments with Neurology, Dr. Janann Colonel. Seizures controlled with Levetiracetam.  BPH treated Flomax 0.4 mg once daily.   Glaucoma treated with Xalatan.

## 2013-05-06 NOTE — Progress Notes (Signed)
Pre visit review using our clinic review tool, if applicable. No additional management support is needed unless otherwise documented below in the visit note. 

## 2013-05-06 NOTE — Patient Instructions (Signed)
It was great meeting you today Evan Escobar!   Labs have been ordered for you, when you report to lab please be fasting.    Hypertension Hypertension is another name for high blood pressure. High blood pressure may mean that your heart needs to work harder to pump blood. Blood pressure consists of two numbers, which includes a higher number over a lower number (example: 110/72). HOME CARE   Make lifestyle changes as told by your doctor. This may include weight loss and exercise.  Take your blood pressure medicine every day.  Limit how much salt you use.  Stop smoking if you smoke.  Do not use drugs.  Talk to your doctor if you are using decongestants or birth control pills. These medicines might make blood pressure higher.  Females should not drink more than 1 alcoholic drink per day. Males should not drink more than 2 alcoholic drinks per day.  See your doctor as told. GET HELP RIGHT AWAY IF:   You have a blood pressure reading with a top number of 180 or higher.  You get a very bad headache.  You get blurred or changing vision.  You feel confused.  You feel weak, numb, or faint.  You get chest or belly (abdominal) pain.  You throw up (vomit).  You cannot breathe very well. MAKE SURE YOU:   Understand these instructions.  Will watch your condition.  Will get help right away if you are not doing well or get worse. Document Released: 07/23/2007 Document Revised: 04/28/2011 Document Reviewed: 07/23/2007 [facility] Patient Information 2014 [place], [place].     Health Maintenance, Males A healthy lifestyle and preventative care can promote health and wellness.  Maintain regular health, dental, and eye exams.  Eat a healthy diet. Foods like vegetables, fruits, whole grains, low-fat dairy products, and lean protein foods contain the nutrients you need and are low in calories. Decrease your intake of foods high in solid fats, added sugars, and salt. Get information  about a proper diet from your health care provider, if necessary.  Regular physical exercise is one of the most important things you can do for your health. Most adults should get at least 150 minutes of moderate-intensity exercise (any activity that increases your heart rate and causes you to sweat) each week. In addition, most adults need muscle-strengthening exercises on 2 or more days a week.   Maintain a healthy weight. The body mass index (BMI) is a screening tool to identify possible weight problems. It provides an estimate of body fat based on height and weight. Your health care provider can find your BMI and can help you achieve or maintain a healthy weight. For males 20 years and older:  A BMI below 18.5 is considered underweight.  A BMI of 18.5 to 24.9 is normal.  A BMI of 25 to 29.9 is considered overweight.  A BMI of 30 and above is considered obese.  Maintain normal blood lipids and cholesterol by exercising and minimizing your intake of saturated fat. Eat a balanced diet with plenty of fruits and vegetables. Blood tests for lipids and cholesterol should begin at age 30 and be repeated every 5 years. If your lipid or cholesterol levels are high, you are over 50, or you are at high risk for heart disease, you may need your cholesterol levels checked more frequently.Ongoing high lipid and cholesterol levels should be treated with medicines, if diet and exercise are not working.  If you smoke, find out from your health  care provider how to quit. If you do not use tobacco, do not start.  Lung cancer screening is recommended for adults aged 28 80 years who are at high risk for developing lung cancer because of a history of smoking. A yearly low-dose CT scan of the lungs is recommended for people who have at least a 30-pack-year history of smoking and are a current smoker or have quit within the past 15 years. A pack year of smoking is smoking an average of 1 pack of cigarettes a day for  1 year (for example, a 30-pack-year history of smoking could mean smoking 1 pack a day for 30 years or 2 packs a day for 15 years). Yearly screening should continue until the smoker has stopped smoking for at least 15 years. Yearly screening should be stopped for people who develop a health problem that would prevent them from having lung cancer treatment.  If you choose to drink alcohol, do not have more than 2 drinks per day. One drink is considered to be 12 oz (360 mL) of beer, 5 oz (150 mL) of wine, or 1.5 oz (45 mL) of liquor.  Avoid use of street drugs. Do not share needles with anyone. Ask for help if you need support or instructions about stopping the use of drugs.  High blood pressure causes heart disease and increases the risk of stroke. Blood pressure should be checked at least every 1 2 years. Ongoing high blood pressure should be treated with medicines if weight loss and exercise are not effective.  If you are 63 72 years old, ask your health care provider if you should take aspirin to prevent heart disease.  Diabetes screening involves taking a blood sample to check your fasting blood sugar level. This should be done once every 3 years after age 20, if you are at a normal weight and without risk factors for diabetes. Testing should be considered at a younger age or be carried out more frequently if you are overweight and have at least 1 risk factor for diabetes.  Colorectal cancer can be detected and often prevented. Most routine colorectal cancer screening begins at the age of 33 and continues through age 44. However, your health care provider may recommend screening at an earlier age if you have risk factors for colon cancer. On a yearly basis, your health care provider may provide home test kits to check for hidden blood in the stool. A small camera at the end of a tube may be used to directly examine the colon (sigmoidoscopy or colonoscopy) to detect the earliest forms of colorectal  cancer. Talk to your health care provider about this at age 72, when routine screening begins. A direct exam of the colon should be repeated every 5 10 years through age 99, unless early forms of pre-cancerous polyps or small growths are found.  People who are at an increased risk for hepatitis B should be screened for this virus. You are considered at high risk for hepatitis B if:  You were born in a country where hepatitis B occurs often. Talk with your health care provider about which countries are considered high-risk.  Your parents were born in a high-risk country and you have not received a shot to protect against hepatitis B (hepatitis B vaccine).  You have HIV or AIDS.  You use needles to inject street drugs.  You live with, or have sex with, someone who has hepatitis B.  You are a man who has sex  with other men (MSM).  You get hemodialysis treatment.  You take certain medicines for conditions like cancer, organ transplantation, and autoimmune conditions.  Hepatitis C blood testing is recommended for all people born from [birth year] through 1964 and any individual with known risk factors for hepatitis C.  Healthy men should no longer receive prostate-specific antigen (PSA) blood tests as part of routine cancer screening. Talk to your health care provider about prostate cancer screening.  Testicular cancer screening is not recommended for adolescents or adult males who have no symptoms. Screening includes self-exam, a health care provider exam, and other screening tests. Consult with your health care provider about any symptoms you have or any concerns you have about testicular cancer.  Practice safe sex. Use condoms and avoid high-risk sexual practices to reduce the spread of sexually transmitted infections (STIs).  Use sunscreen. Apply sunscreen liberally and repeatedly throughout the day. You should seek shade when your shadow is shorter than you. Protect yourself by wearing long  sleeves, pants, a wide-brimmed hat, and sunglasses year round, whenever you are outdoors.  Tell your health care provider of new moles or changes in moles, especially if there is a change in shape or color. Also tell your provider if a mole is larger than the size of a pencil eraser.  A one-time screening for abdominal aortic aneurysm (AAA) and surgical repair of large AAAs by ultrasound is recommended for men aged 51 75 years who are current or former smokers.  Stay current with your vaccines (immunizations). Document Released: 08/02/2007 Document Revised: 11/24/2012 Document Reviewed: 07/01/2010 [facility] Patient Information 2014 [place], [place].

## 2013-05-07 DIAGNOSIS — H409 Unspecified glaucoma: Secondary | ICD-10-CM | POA: Insufficient documentation

## 2013-05-07 DIAGNOSIS — I10 Essential (primary) hypertension: Secondary | ICD-10-CM | POA: Insufficient documentation

## 2013-05-07 NOTE — Assessment & Plan Note (Signed)
treated with Xalatan.

## 2013-05-07 NOTE — Assessment & Plan Note (Addendum)
Keep appointments with Neurology, Dr. Janann Colonel. Seizures controlled with Levetiracetam 500 mg twice daily

## 2013-05-07 NOTE — Assessment & Plan Note (Addendum)
Treated with Lisinopril 20 mg once daily Metoprolol 25 mg once daily  BP Readings from Last 3 Encounters:  05/06/13 122/82  03/17/13 136/75  10/13/12 131/71

## 2013-05-13 ENCOUNTER — Other Ambulatory Visit (INDEPENDENT_AMBULATORY_CARE_PROVIDER_SITE_OTHER): Payer: Managed Care, Other (non HMO)

## 2013-05-13 DIAGNOSIS — H6120 Impacted cerumen, unspecified ear: Secondary | ICD-10-CM

## 2013-05-13 DIAGNOSIS — Z0000 Encounter for general adult medical examination without abnormal findings: Secondary | ICD-10-CM

## 2013-05-13 DIAGNOSIS — I10 Essential (primary) hypertension: Secondary | ICD-10-CM

## 2013-05-13 DIAGNOSIS — H409 Unspecified glaucoma: Secondary | ICD-10-CM

## 2013-05-13 DIAGNOSIS — R569 Unspecified convulsions: Secondary | ICD-10-CM

## 2013-05-13 DIAGNOSIS — N400 Enlarged prostate without lower urinary tract symptoms: Secondary | ICD-10-CM

## 2013-05-13 LAB — BASIC METABOLIC PANEL
BUN: 17 mg/dL (ref 6–23)
CO2: 29 mEq/L (ref 19–32)
Calcium: 9.5 mg/dL (ref 8.4–10.5)
Chloride: 104 mEq/L (ref 96–112)
Creatinine, Ser: 1.2 mg/dL (ref 0.4–1.5)
GFR: 75.14 mL/min (ref >60.00)
Glucose, Bld: 86 mg/dL (ref 70–99)
Potassium: 4.4 mEq/L (ref 3.5–5.1)
Sodium: 140 mEq/L (ref 135–145)

## 2013-05-13 LAB — CBC WITH DIFFERENTIAL/PLATELET
Basophils Absolute: 0 10*3/uL (ref 0.0–0.1)
Basophils Relative: 0.5 % (ref 0.0–3.0)
Eosinophils Absolute: 0.1 10*3/uL (ref 0.0–0.7)
Eosinophils Relative: 2.9 % (ref 0.0–5.0)
HCT: 38.6 % — ABNORMAL LOW (ref 39.0–52.0)
Hemoglobin: 12.8 g/dL — ABNORMAL LOW (ref 13.0–17.0)
Lymphocytes Relative: 34.4 % (ref 12.0–46.0)
Lymphs Abs: 1.5 10*3/uL (ref 0.7–4.0)
MCHC: 33.1 g/dL (ref 30.0–36.0)
MCV: 83.3 fl (ref 78.0–100.0)
Monocytes Absolute: 0.5 10*3/uL (ref 0.1–1.0)
Monocytes Relative: 12.2 % — ABNORMAL HIGH (ref 3.0–12.0)
Neutro Abs: 2.2 10*3/uL (ref 1.4–7.7)
Neutrophils Relative %: 50 % (ref 43.0–77.0)
Platelets: 197 10*3/uL (ref 150.0–400.0)
RBC: 4.63 Mil/uL (ref 4.22–5.81)
RDW: 14.1 % (ref 11.5–14.6)
WBC: 4.4 10*3/uL — ABNORMAL LOW (ref 4.5–10.5)

## 2013-05-13 LAB — LIPID PANEL
Cholesterol: 190 mg/dL (ref 0–200)
HDL: 58.7 mg/dL (ref >39.00)
LDL Cholesterol: 124 mg/dL — ABNORMAL HIGH (ref 0–99)
Total CHOL/HDL Ratio: 3
Triglycerides: 38 mg/dL (ref 0.0–149.0)
VLDL: 7.6 mg/dL (ref 0.0–40.0)

## 2013-05-13 LAB — HEPATIC FUNCTION PANEL
ALT: 12 U/L (ref 0–53)
AST: 19 U/L (ref 0–37)
Albumin: 4 g/dL (ref 3.5–5.2)
Alkaline Phosphatase: 32 U/L — ABNORMAL LOW (ref 39–117)
Bilirubin, Direct: 0.1 mg/dL (ref 0.0–0.3)
Total Bilirubin: 0.7 mg/dL (ref 0.3–1.2)
Total Protein: 7.1 g/dL (ref 6.0–8.3)

## 2013-05-13 LAB — URINALYSIS, ROUTINE W REFLEX MICROSCOPIC
Bilirubin Urine: NEGATIVE
Hgb urine dipstick: NEGATIVE
Ketones, ur: NEGATIVE
Nitrite: POSITIVE — AB
Specific Gravity, Urine: 1.015 (ref 1.000–1.030)
Total Protein, Urine: NEGATIVE
Urine Glucose: NEGATIVE
Urobilinogen, UA: 0.2 (ref 0.0–1.0)
pH: 6 (ref 5.0–8.0)

## 2013-05-13 LAB — PSA: PSA: 2.86 ng/mL (ref 0.10–4.00)

## 2013-05-13 LAB — TSH: TSH: 1.31 u[IU]/mL (ref 0.35–5.50)

## 2013-06-11 ENCOUNTER — Emergency Department (INDEPENDENT_AMBULATORY_CARE_PROVIDER_SITE_OTHER)
Admission: EM | Admit: 2013-06-11 | Discharge: 2013-06-11 | Disposition: A | Discharge status: Home / Self Care | Payer: Managed Care, Other (non HMO) | Source: Home / Self Care | Attending: Family Medicine | Admitting: Family Medicine

## 2013-06-11 ENCOUNTER — Encounter (HOSPITAL_COMMUNITY): Payer: Self-pay | Admitting: Emergency Medicine

## 2013-06-11 ENCOUNTER — Emergency Department (INDEPENDENT_AMBULATORY_CARE_PROVIDER_SITE_OTHER): Payer: Managed Care, Other (non HMO)

## 2013-06-11 DIAGNOSIS — S161XXA Strain of muscle, fascia and tendon at neck level, initial encounter: Secondary | ICD-10-CM

## 2013-06-11 DIAGNOSIS — S139XXA Sprain of joints and ligaments of unspecified parts of neck, initial encounter: Secondary | ICD-10-CM

## 2013-06-11 MED ORDER — DICLOFENAC POTASSIUM 50 MG PO TABS: 50.0000 mg | ORAL_TABLET | Freq: Three times a day (TID) | ORAL | Status: DC

## 2013-06-11 MED ORDER — CYCLOBENZAPRINE HCL 5 MG PO TABS: 5.0000 mg | ORAL_TABLET | Freq: Three times a day (TID) | ORAL | Status: DC | PRN

## 2013-06-11 NOTE — Discharge Instructions (Signed)
Use heat, collar and medicine as needed, see your doctor if further problems

## 2013-06-11 NOTE — ED Provider Notes (Signed)
CSN: [id number]     Arrival date & time 06/11/13  1003 History   First MD Initiated Contact with Patient 06/11/13 1103     Chief Complaint  Patient presents with  . Neck Pain   (Consider location/radiation/quality/duration/timing/severity/associated sxs/prior Treatment) Patient is a 73 y.o. male presenting with neck pain. The history is provided by the patient.  Neck Pain Pain location:  L side Quality:  Stabbing and stiffness Pain radiates to:  Does not radiate Pain severity:  Moderate Onset quality:  Sudden Duration:  3 days Progression:  Unchanged Chronicity:  New Context: not recent injury   Context comment:  Thinks related to working on car, sx next am., no neuro sx.   Past Medical History  Diagnosis Date  . Hypertension   . Glaucoma   . Polyp, sigmoid colon 11/05/2011  . Seizure   . Normocytic anemia 09/25/2012  . Seizures 03/17/2013   Past Surgical History  Procedure Laterality Date  . Glaucoma repair  10 yrs ago  . Cataract extraction, bilateral     Family History  Problem Relation Age of Onset  . Colon cancer Neg Hx   . Stroke Mother   . Stroke Father   . High blood pressure Mother   . High blood pressure Father    History  Substance Use Topics  . Smoking status: Former Smoker -- 1.00 packs/day    Types: Cigarettes    Quit date: 02/18/2007  . Smokeless tobacco: Never Used  . Alcohol Use: No     Comment: quit in 2009    Review of Systems  Constitutional: Negative.   Musculoskeletal: Positive for neck pain and neck stiffness.  Skin: Negative.     Allergies  Review of patient's allergies indicates no known allergies.  Home Medications   Prior to Admission medications   Medication Sig Start Date End Date Taking? Authorizing Provider  aspirin 81 MG tablet Take 81 mg by mouth daily.    Historical Provider, MD  cycloSPORINE (RESTASIS) 0.05 % ophthalmic emulsion Place 1 drop into both eyes 2 (two) times daily.    Historical Provider, MD  latanoprost  (XALATAN) 0.005 % ophthalmic solution  09/20/12   Historical Provider, MD  levETIRAcetam (KEPPRA) 500 MG tablet Take 1 tablet (500 mg total) by mouth 2 (two) times daily. 03/17/13   Hulen Luster, DO  lisinopril (PRINIVIL,ZESTRIL) 20 MG tablet Take 1 tablet by mouth Daily. 10/07/11   Historical Provider, MD  metoprolol succinate (TOPROL-XL) 25 MG 24 hr tablet Take 1 tablet by mouth At bedtime. 10/17/11   Historical Provider, MD  Multiple Vitamin (MULTIVITAMIN) tablet Take 1 tablet by mouth daily.    Historical Provider, MD  tamsulosin (FLOMAX) 0.4 MG CAPS Take 0.4 mg by mouth daily.    Historical Provider, MD   BP 163/79  Pulse 67  Temp(Src) 99.1 F (37.3 C) (Oral)  Resp 16  SpO2 98% Physical Exam  Nursing note and vitals reviewed. Constitutional: He is oriented to person, place, and time. He appears well-developed and well-nourished.  Neck: Muscular tenderness present.    Musculoskeletal: Normal range of motion.  Lymphadenopathy:    He has no cervical adenopathy.  Neurological: He is alert and oriented to person, place, and time.  Skin: Skin is warm and dry.    ED Course  Procedures (including critical care time) Labs Review Labs Reviewed - No data to display  Imaging Review Dg Cervical Spine Complete  06/11/2013   CLINICAL DATA:  Three days of left neck  pain after working on call are  EXAM: CERVICAL SPINE  4+ VIEWS  COMPARISON:  Prior brain MRI 10/25/2012  FINDINGS: No acute fracture, malalignment or prevertebral soft tissue swelling. Lower cervical spondylosis is present. 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 There is mild anterolisthesis of C5 on C6. No disruption of the spinal laminar line. At least moderate left foraminal stenosis at C6-C7. Visualized upper lungs are unremarkable.  IMPRESSION: 1. No acute fracture, malalignment or prevertebral soft tissue swelling. 2. 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   Electronically Signed   By: Jacqulynn Cadet M.D.   On: 06/11/2013 11:56   X-rays reviewed and report per radiologist.   MDM   1. Cervical myofascial strain        Billy Fischer, MD 06/11/13 [phone]

## 2013-06-11 NOTE — ED Notes (Signed)
Pt c/o neck pain/stifness onset 3 days Denies inj/trauma Recalls that he possibly strained his neck while working on car Pain increases when turning to the left Alert w/no signs of acute distress.

## 2013-08-22 ENCOUNTER — Telehealth: Payer: Self-pay | Admitting: Neurology

## 2013-08-22 MED ORDER — LEVETIRACETAM 500 MG PO TABS: 500.0000 mg | ORAL_TABLET | Freq: Two times a day (BID) | ORAL | Status: DC

## 2013-08-22 NOTE — Telephone Encounter (Signed)
Patient requesting refill of his seizure medication - he only has 2 pills left and needs enough to get him to his next visit. Pharmacy is [facility] [address] Please call to advise.

## 2013-08-22 NOTE — Telephone Encounter (Signed)
Rx has been sent.  Called back, got no answer.

## 2013-09-05 ENCOUNTER — Encounter: Payer: Self-pay | Admitting: Internal Medicine

## 2013-09-05 ENCOUNTER — Ambulatory Visit (INDEPENDENT_AMBULATORY_CARE_PROVIDER_SITE_OTHER): Payer: Managed Care, Other (non HMO) | Admitting: Internal Medicine

## 2013-09-05 VITALS — BP 150/80 | HR 69 | Temp 98.2°F | Wt 152.0 lb

## 2013-09-05 DIAGNOSIS — I10 Essential (primary) hypertension: Secondary | ICD-10-CM

## 2013-09-05 MED ORDER — METOPROLOL SUCCINATE ER 25 MG PO TB24: 25.0000 mg | ORAL_TABLET | Freq: Every day | ORAL | Status: DC

## 2013-09-05 MED ORDER — LISINOPRIL 20 MG PO TABS: 20.0000 mg | ORAL_TABLET | Freq: Every day | ORAL | Status: DC

## 2013-09-05 NOTE — Patient Instructions (Signed)

## 2013-09-05 NOTE — Progress Notes (Signed)
Pre visit review using our clinic review tool, if applicable. No additional management support is needed unless otherwise documented below in the visit note. 

## 2013-09-05 NOTE — Progress Notes (Signed)
   Subjective:    Patient ID: Evan Escobar, male    DOB: [birth year], 73 y.o.   MRN: [id number]  HPI Blood pressure range / average : 119/70-130/80@ pharmacy    Compliant with anti hypertemsive medication. No lightheadedness or other adverse medication effect described.  A heart healthy /low salt diet is not followed. Exercise encompasses 60 minutes 2  times per week as  Senior exercise without symptoms.      Review of Systems  Significant headaches, epistaxis, chest pain, palpitations, exertional dyspnea, claudication, paroxysmal nocturnal dyspnea, or edema absent.       Objective:   Physical Exam    Significant or distinguishing  findings on physical exam are documented first.  Below that are other systems examined & findings.  Thin but healthy appearing. Dense arcus senilis. The second heart sound is increased in intensity. Breath sounds are decreased without increased work of breathing.    No carotid bruits are present.No neck pain distention present at 10 - 15 degrees. Thyroid normal to palpation  Heart rhythm and rate are normal with no gallop or murmur  There is no evidence of aortic aneurysm or renal artery bruits  Abdomen soft with no organomegaly or masses. No HJR  No clubbing, cyanosis or edema present.  Pedal pulses are intact   No ischemic skin changes are present . Fingernails healthy   Alert and oriented. Strength, tone, DTRs reflexes normal          Assessment & Plan:  #1 HTN; pharmacy records indicate adequate control. BMET current No med change

## 2013-09-14 ENCOUNTER — Ambulatory Visit (INDEPENDENT_AMBULATORY_CARE_PROVIDER_SITE_OTHER): Payer: Medicare HMO | Admitting: Neurology

## 2013-09-14 ENCOUNTER — Encounter: Payer: Self-pay | Admitting: Neurology

## 2013-09-14 VITALS — BP 129/73 | HR 73 | Ht 67.0 in | Wt 150.0 lb

## 2013-09-14 DIAGNOSIS — R569 Unspecified convulsions: Secondary | ICD-10-CM

## 2013-09-14 MED ORDER — LEVETIRACETAM 500 MG PO TABS: 500.0000 mg | ORAL_TABLET | Freq: Two times a day (BID) | ORAL | Status: DC

## 2013-09-14 NOTE — Patient Instructions (Signed)
Overall you are doing fairly well but I do want to suggest a few things today:   Remember to drink plenty of fluid, eat healthy meals and do not skip any meals. Try to eat protein with a every meal and eat a healthy snack such as fruit or nuts in between meals. Try to keep a regular sleep-wake schedule and try to exercise daily, particularly in the form of walking, 20-30 minutes a day, if you can.   As far as your medications are concerned, I would like to suggest the following: 1)Please continue taking Keppra 500mg  twice a day  I would like to see you back in 6 months, sooner if we need to. Please call us with any interim questions, concerns, problems, updates or refill requests.   My clinical assistant and will answer any of your questions and relay your messages to me and also relay most of my messages to you.   Our phone number is [phone]. We also have an after hours call service for urgent matters and there is a physician on-call for urgent questions. For any emergencies you know to call 911 or go to the nearest emergency room

## 2013-09-14 NOTE — Progress Notes (Signed)
[facility]  Provider:  Dr Janann Colonel Referring Provider: Theressa Millard, MD Primary Care Physician:  Stacy Gardner, PA-C  CC:  Seizure   HPI:  Evan Escobar is a 73 y.o. male here as a follow up from Dr. Arnoldo Morale for question of possible seizure. Last visit was 02/2013 at which time he was doing well overall. He is currently taking Keppra 500mg  twice a day and tolerating it well. No further seizure episodes, no LOC. No aura or abnormal sensations. No dizziness, no light headed, no confusion, no gait instability. Currently driving, no issues with this.   Initial visit 09/2012: Are one month ago he remembers being in bed the next thing he remembers is waking up in the ER. He has no other recollection of the event. Reports the day of the normal day. Denies any aura or warning prior to the event. Per his wife who presents with a history he was having generalized tonic-clonic movements of all extremities, eyes rolled back, foaming at the mouth with a groaning moaning sound. She feels it lasted around 5-10 minutes of shaking, he was confused after for around one hour before returning to baseline. Denies any tongue biting wife notes some urinary incontinence. Has had no prior episodes similar to this. Both patient and his wife to describe a few prior episodes of the past year where he would get worse sensation in his left arm and then would have staring episodes and zone out. There is episodes he would not talk or of nonsensical speech. His last a few minutes and then he returned to baseline. Otherwise unremarkable past medical history. Denies any head, history of cancer. No prior history of seizure no family history of seizure no febrile seizures. Saw a cardiologist for workup and told everything was fine from a cardiac standpoint.  Quit smoking years ago, was a light smoker. History of EtOH use but doesn't drink currently.  Head CT from 08/2012 images reviewed and found to be  unremarkable.  Review of Systems: Out of a complete 14 system review, the patient complains of only the following symptoms, and all other reviewed systems are negative.  Positive for light sensitivity  History   Social History  . Marital Status: Married    Spouse Name: Terrence Dupont    Number of Children: 1  . Years of Education: 11   Occupational History  . retired    Social History Main Topics  . Smoking status: Former Smoker -- 1.00 packs/day    Types: Cigarettes    Quit date: 02/18/2007  . Smokeless tobacco: Never Used  . Alcohol Use: No     Comment: quit in 2009  . Drug Use: No  . Sexual Activity: Not on file   Other Topics Concern  . Not on file   Social History Narrative   Patient lives at home with his wife Terrence Dupont). Patient is retired. Patient has 11 th grade education.    Caffeine- one cup of coffee daily and one soda.   Right handed.    Family History  Problem Relation Age of Onset  . Colon cancer Neg Hx   . Stroke Mother   . Stroke Father   . High blood pressure Mother   . High blood pressure Father     Past Medical History  Diagnosis Date  . Hypertension   . Glaucoma   . Polyp, sigmoid colon 11/05/2011  . Seizure   . Normocytic anemia 09/25/2012  . Seizures 03/17/2013    Past Surgical  History  Procedure Laterality Date  . Glaucoma repair  10 yrs ago  . Cataract extraction, bilateral      Current Outpatient Prescriptions  Medication Sig Dispense Refill  . aspirin 81 MG tablet Take 81 mg by mouth daily.      . cyclobenzaprine (FLEXERIL) 5 MG tablet Take 1 tablet (5 mg total) by mouth 3 (three) times daily as needed for muscle spasms.  30 tablet  0  . cycloSPORINE (RESTASIS) 0.05 % ophthalmic emulsion Place 1 drop into both eyes 2 (two) times daily.      . diclofenac (CATAFLAM) 50 MG tablet Take 1 tablet (50 mg total) by mouth 3 (three) times daily. For neck pain  15 tablet  1  . latanoprost (XALATAN) 0.005 % ophthalmic solution       . levETIRAcetam  (KEPPRA) 500 MG tablet Take 1 tablet (500 mg total) by mouth 2 (two) times daily.  60 tablet  0  . lisinopril (PRINIVIL,ZESTRIL) 20 MG tablet Take 1 tablet (20 mg total) by mouth daily.  30 tablet  5  . metoprolol succinate (TOPROL-XL) 25 MG 24 hr tablet Take 1 tablet (25 mg total) by mouth daily.  30 tablet  5  . Multiple Vitamin (MULTIVITAMIN) tablet Take 1 tablet by mouth daily.      . tamsulosin (FLOMAX) 0.4 MG CAPS Take 0.4 mg by mouth daily.       No current facility-administered medications for this visit.    Allergies as of 09/14/2013  . (No Known Allergies)    Vitals: BP 129/73  Pulse 73  Ht 5\' 7"  (1.702 m)  Wt 150 lb (68.04 kg)  BMI 23.49 kg/m2 Last Weight:  Wt Readings from Last 1 Encounters:  09/14/13 150 lb (68.04 kg)   Last Height:   Ht Readings from Last 1 Encounters:  09/14/13 5\' 7"  (1.702 m)     Physical exam: Exam: Gen: NAD, conversant Eyes: anicteric sclerae, moist conjunctivae HENT: Atraumatic Neck: Trachea midline; supple,  Lungs: CTA, no wheezing, rales, rhonic                          CV: RRR, no MRG Abdomen: Soft, non-tender;  Extremities: No peripheral edema  Skin: Normal temperature, no rash,  Psych: Appropriate affect, pleasant  Neuro: MS: AA&Ox3, appropriately interactive, normal affect   Speech: fluent w/o paraphasic error  Memory: good recent and remote recall  CN: PERRL, EOMI no nystagmus, no ptosis, sensation intact to LT V1-V3 bilat, face symmetric, no weakness, hearing grossly intact, palate elevates symmetrically, shoulder shrug 5/5 bilat,  tongue protrudes midline, no fasiculations noted.  Motor: normal bulk and tone Strength: 5/5  In all extremities  Coord: rapid alternating and point-to-point (FNF, HTS) movements intact.  Reflexes: symmetrical, bilat downgoing toes  Sens: LT intact in all extremities  Gait: posture, stance, stride and arm-swing normal. Tandem gait intact. Able to walk on heels and toes. Romberg  absent.   Assessment:  After physical and neurologic examination, review of laboratory studies, imaging, neurophysiology testing and pre-existing records, assessment will be reviewed on the problem list.  Plan:  Treatment plan and additional workup will be reviewed under Problem List.  1)Seizures  Evan Escobar is a pleasant 73 year old gentleman who presents for follow up evaluation of possible seizure disorder. Episode described appears consistent with a generalized tonic-clonic seizure. He also notes some episodes that appear concerning for partial complex seizure. Currently taking Keppra 500mg  twice a day, tolerating it  well. No adverse effects. No further seizure episodes. Will continue on Keppra for now. Follow up with NP Cecille Rubin in 6 months.    Maansi Wike, JUSTIN

## 2013-11-07 ENCOUNTER — Telehealth: Payer: Self-pay | Admitting: Neurology

## 2013-11-07 ENCOUNTER — Ambulatory Visit: Payer: Managed Care, Other (non HMO) | Admitting: Physician Assistant

## 2013-11-08 ENCOUNTER — Other Ambulatory Visit: Payer: Self-pay | Admitting: Neurology

## 2013-11-08 MED ORDER — LEVETIRACETAM 500 MG PO TABS: 500.0000 mg | ORAL_TABLET | Freq: Two times a day (BID) | ORAL | Status: DC

## 2013-11-08 NOTE — Telephone Encounter (Signed)
Pt's Rx was sent to [facility], confirmation received.

## 2013-11-14 ENCOUNTER — Encounter: Payer: Self-pay | Admitting: Internal Medicine

## 2013-11-14 ENCOUNTER — Ambulatory Visit (INDEPENDENT_AMBULATORY_CARE_PROVIDER_SITE_OTHER): Payer: Managed Care, Other (non HMO) | Admitting: Internal Medicine

## 2013-11-14 VITALS — BP 152/64 | HR 57 | Temp 97.4°F | Resp 18 | Ht 67.0 in | Wt 152.4 lb

## 2013-11-14 DIAGNOSIS — I10 Essential (primary) hypertension: Secondary | ICD-10-CM

## 2013-11-14 DIAGNOSIS — H409 Unspecified glaucoma: Secondary | ICD-10-CM

## 2013-11-14 DIAGNOSIS — D649 Anemia, unspecified: Secondary | ICD-10-CM

## 2013-11-14 DIAGNOSIS — Z23 Encounter for immunization: Secondary | ICD-10-CM

## 2013-11-14 DIAGNOSIS — R569 Unspecified convulsions: Secondary | ICD-10-CM

## 2013-11-14 MED ORDER — DOXAZOSIN MESYLATE 4 MG PO TABS: 4.0000 mg | ORAL_TABLET | Freq: Every day | ORAL | Status: DC

## 2013-11-14 NOTE — Assessment & Plan Note (Addendum)
Most recent hemoglobin is 12.8. He denies any blood in his stools. Had colonoscopy in 2013 and is due for repeat in 2018.

## 2013-11-14 NOTE — Assessment & Plan Note (Signed)
BP slightly elevated at today's visit. We'll stop Flomax, will start Cardura 4 mg nightly. Continue lisinopril 20 mg daily, continue metoprolol-xl 25 mg daily. If BP elevated at next visit we'll make change medical regimen.

## 2013-11-14 NOTE — Patient Instructions (Signed)
We will have you stop taking tamsulosin (also called flomax) as it is not helping with you urinating too much. We will try a different medicine called doxazosin (also called cardura). Take 1 pill a day to help decrease how often you are having to urinate. The other thing you can do is to stop drinking liquids after about 7PM at night.   We will see you back in about 6 months to check on your blood pressure.   Work on trying to do some exercise about 4 times per week to help with your blood pressure.  Exercise to Stay Healthy Exercise helps you become and stay healthy. EXERCISE IDEAS AND TIPS Choose exercises that:  You enjoy.  Fit into your day. You do not need to exercise really hard to be healthy. You can do exercises at a slow or medium level and stay healthy. You can:  Stretch before and after working out.  Try yoga, Pilates, or tai chi.  Lift weights.  Walk fast, swim, jog, run, climb stairs, bicycle, dance, or rollerskate.  Take aerobic classes. Exercises that burn about 150 calories:  Running 1  miles in 15 minutes.  Playing volleyball for 45 to 60 minutes.  Washing and waxing a car for 45 to 60 minutes.  Playing touch football for 45 minutes.  Walking 1  miles in 35 minutes.  Pushing a stroller 1  miles in 30 minutes.  Playing basketball for 30 minutes.  Raking leaves for 30 minutes.  Bicycling 5 miles in 30 minutes.  Walking 2 miles in 30 minutes.  Dancing for 30 minutes.  Shoveling snow for 15 minutes.  Swimming laps for 20 minutes.  Walking up stairs for 15 minutes.  Bicycling 4 miles in 15 minutes.  Gardening for 30 to 45 minutes.  Jumping rope for 15 minutes.  Washing windows or floors for 45 to 60 minutes. Document Released: 03/08/2010 Document Revised: 04/28/2011 Document Reviewed: 03/08/2010 [facility] Patient Information 2015 [place], [place]. This information is not intended to replace advice given to you by your health care provider.  Make sure you discuss any questions you have with your health care provider.

## 2013-11-14 NOTE — Assessment & Plan Note (Signed)
He is currently well controlled, however his new eye doctor (his old eye doctor retired) is not covered by his insurance so he needs to find a new provider and is currently scheduled to see someone.

## 2013-11-14 NOTE — Progress Notes (Signed)
Pre visit review using our clinic review tool, if applicable. No additional management support is needed unless otherwise documented below in the visit note. 

## 2013-11-14 NOTE — Assessment & Plan Note (Signed)
History of 1 seizure, now on Keppra 500 mg twice a day. He does followup with neurology and we'll see them again in February. Hopefully if he continues to be seizure free he may be able to stop Keppra. Will look into the records to see what the circumstances were surrounding his seizure.

## 2013-11-14 NOTE — Progress Notes (Signed)
   Subjective:    Patient ID: Evan Escobar, male    DOB: 25-Sep-1940, 73 y.o.   MRN: [id number]  HPI The patient is a 73 year old man who comes in today to establish care. He has past medical history of glaucoma, hypertension, anemia, seizures. He states that he did have one seizure back earlier this year and was told by his neurologist that he could come off seizure medication if he was seizure free for another 6 months. He does followup with them in February. He has been taking his blood pressure medication without any problems. He does continue to see his eye doctor and his glaucoma, is well-controlled at this time. He has been having some constipation off and on. He talked to pharmacist about it and is now taking a stool softener over-the-counter. This has helped him to be regular and not have hard stools. He denies seeing any blood in any of his stools. He denies chest pain, shortness breath, abdominal pain, nausea, vomiting, diarrhea. He does need hearing aids however he is unable to afford them at this time. His vision is fairly decent at this time. He does have BPH and feels that his current Flomax is not helping him much. He would like to know if there is any way we could change this.  Review of Systems  Constitutional: Negative for fever, activity change, appetite change and fatigue.  HENT: Positive for hearing loss.   Eyes: Negative.   Respiratory: Negative for cough, chest tightness, shortness of breath and wheezing.   Cardiovascular: Negative for chest pain, palpitations and leg swelling.  Gastrointestinal: Positive for constipation. Negative for nausea, vomiting, abdominal pain, diarrhea, blood in stool and abdominal distention.  Endocrine: Negative.   Genitourinary: Positive for frequency and enuresis. Negative for dysuria, urgency, flank pain and difficulty urinating.  Musculoskeletal: Negative for arthralgias, back pain and myalgias.  Skin: Negative.   Allergic/Immunologic:  Negative.   Neurological: Negative for dizziness, seizures, syncope, weakness, light-headedness and headaches.      Objective:   Physical Exam  Constitutional: He is oriented to person, place, and time. He appears well-developed and well-nourished. No distress.  HENT:  Head: Normocephalic and atraumatic.  Right Ear: External ear normal.  Left Ear: External ear normal.  Minimal wax in the left ear however ear canal is nonobstructed.  Eyes: EOM are normal.  Neck: Normal range of motion.  Cardiovascular: Normal rate and regular rhythm.   Pulmonary/Chest: Effort normal and breath sounds normal. No respiratory distress. He has no wheezes. He has no rales.  Abdominal: Soft. Bowel sounds are normal. He exhibits no distension. There is no tenderness. There is no rebound.  Neurological: He is alert and oriented to person, place, and time. Coordination normal.  Skin: Skin is warm and dry.   Filed Vitals:   11/14/13 0948  BP: 152/64  Pulse: 57  Temp: 97.4 F (36.3 C)  TempSrc: Oral  Resp: 18  Height: 5\' 7"  (1.702 m)  Weight: 152 lb 6.4 oz (69.128 kg)  SpO2: 98%      Assessment & Plan:  Flu shot given today.

## 2014-02-08 ENCOUNTER — Encounter (HOSPITAL_COMMUNITY): Payer: Self-pay

## 2014-02-08 ENCOUNTER — Emergency Department (HOSPITAL_COMMUNITY)
Admission: EM | Admit: 2014-02-08 | Discharge: 2014-02-08 | Disposition: A | Discharge status: Home / Self Care | Payer: No Typology Code available for payment source | Attending: Emergency Medicine | Admitting: Emergency Medicine

## 2014-02-08 DIAGNOSIS — Z87891 Personal history of nicotine dependence: Secondary | ICD-10-CM | POA: Insufficient documentation

## 2014-02-08 DIAGNOSIS — Y9389 Activity, other specified: Secondary | ICD-10-CM | POA: Diagnosis not present

## 2014-02-08 DIAGNOSIS — D649 Anemia, unspecified: Secondary | ICD-10-CM | POA: Diagnosis not present

## 2014-02-08 DIAGNOSIS — I10 Essential (primary) hypertension: Secondary | ICD-10-CM | POA: Diagnosis not present

## 2014-02-08 DIAGNOSIS — R42 Dizziness and giddiness: Secondary | ICD-10-CM | POA: Insufficient documentation

## 2014-02-08 DIAGNOSIS — Y92410 Unspecified street and highway as the place of occurrence of the external cause: Secondary | ICD-10-CM | POA: Diagnosis not present

## 2014-02-08 DIAGNOSIS — Y998 Other external cause status: Secondary | ICD-10-CM | POA: Diagnosis not present

## 2014-02-08 DIAGNOSIS — Z79899 Other long term (current) drug therapy: Secondary | ICD-10-CM | POA: Diagnosis not present

## 2014-02-08 DIAGNOSIS — G40909 Epilepsy, unspecified, not intractable, without status epilepticus: Secondary | ICD-10-CM | POA: Diagnosis not present

## 2014-02-08 DIAGNOSIS — Z86010 Personal history of colonic polyps: Secondary | ICD-10-CM | POA: Insufficient documentation

## 2014-02-08 DIAGNOSIS — S199XXA Unspecified injury of neck, initial encounter: Secondary | ICD-10-CM | POA: Insufficient documentation

## 2014-02-08 DIAGNOSIS — Z7982 Long term (current) use of aspirin: Secondary | ICD-10-CM | POA: Insufficient documentation

## 2014-02-08 LAB — CBC WITH DIFFERENTIAL/PLATELET
Basophils Absolute: 0 10*3/uL (ref 0.0–0.1)
Basophils Relative: 1 % (ref 0–1)
Eosinophils Absolute: 0.2 10*3/uL (ref 0.0–0.7)
Eosinophils Relative: 4 % (ref 0–5)
HCT: 35.8 % — ABNORMAL LOW (ref 39.0–52.0)
Hemoglobin: 11.6 g/dL — ABNORMAL LOW (ref 13.0–17.0)
Lymphocytes Relative: 28 % (ref 12–46)
Lymphs Abs: 1.2 10*3/uL (ref 0.7–4.0)
MCH: 26.7 pg (ref 26.0–34.0)
MCHC: 32.4 g/dL (ref 30.0–36.0)
MCV: 82.5 fL (ref 78.0–100.0)
Monocytes Absolute: 0.5 10*3/uL (ref 0.1–1.0)
Monocytes Relative: 11 % (ref 3–12)
Neutro Abs: 2.5 10*3/uL (ref 1.7–7.7)
Neutrophils Relative %: 56 % (ref 43–77)
Platelets: 165 10*3/uL (ref 150–400)
RBC: 4.34 MIL/uL (ref 4.22–5.81)
RDW: 13.8 % (ref 11.5–15.5)
WBC: 4.3 10*3/uL (ref 4.0–10.5)

## 2014-02-08 LAB — COMPREHENSIVE METABOLIC PANEL
ALT: 11 U/L (ref 0–53)
AST: 22 U/L (ref 0–37)
Albumin: 3.6 g/dL (ref 3.5–5.2)
Alkaline Phosphatase: 36 U/L — ABNORMAL LOW (ref 39–117)
Anion gap: 6 (ref 5–15)
BUN: 13 mg/dL (ref 6–23)
CO2: 28 mmol/L (ref 19–32)
Calcium: 9.1 mg/dL (ref 8.4–10.5)
Chloride: 105 mEq/L (ref 96–112)
Creatinine, Ser: 1.4 mg/dL — ABNORMAL HIGH (ref 0.50–1.35)
GFR calc Af Amer: 56 mL/min — ABNORMAL LOW (ref >90)
GFR calc non Af Amer: 49 mL/min — ABNORMAL LOW (ref >90)
Glucose, Bld: 120 mg/dL — ABNORMAL HIGH (ref 70–99)
Potassium: 3.5 mmol/L (ref 3.5–5.1)
Sodium: 139 mmol/L (ref 135–145)
Total Bilirubin: 0.2 mg/dL — ABNORMAL LOW (ref 0.3–1.2)
Total Protein: 6.8 g/dL (ref 6.0–8.3)
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
## 2014-02-08 NOTE — Discharge Instructions (Signed)
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
## 2014-02-14 ENCOUNTER — Encounter: Payer: Self-pay | Admitting: Internal Medicine

## 2014-02-14 ENCOUNTER — Ambulatory Visit (INDEPENDENT_AMBULATORY_CARE_PROVIDER_SITE_OTHER): Payer: Managed Care, Other (non HMO) | Admitting: Internal Medicine

## 2014-02-14 ENCOUNTER — Other Ambulatory Visit (INDEPENDENT_AMBULATORY_CARE_PROVIDER_SITE_OTHER): Payer: Managed Care, Other (non HMO)

## 2014-02-14 VITALS — BP 150/74 | HR 61 | Resp 12 | Ht 67.0 in | Wt 156.6 lb

## 2014-02-14 DIAGNOSIS — I10 Essential (primary) hypertension: Secondary | ICD-10-CM

## 2014-02-14 DIAGNOSIS — M25579 Pain in unspecified ankle and joints of unspecified foot: Secondary | ICD-10-CM | POA: Insufficient documentation

## 2014-02-14 LAB — BASIC METABOLIC PANEL
BUN: 17 mg/dL (ref 6–23)
CO2: 30 mEq/L (ref 19–32)
Calcium: 9.1 mg/dL (ref 8.4–10.5)
Chloride: 104 mEq/L (ref 96–112)
Creatinine, Ser: 1.2 mg/dL (ref 0.4–1.5)
GFR: 74.98 mL/min (ref >60.00)
Glucose, Bld: 91 mg/dL (ref 70–99)
Potassium: 4.2 mEq/L (ref 3.5–5.1)
Sodium: 140 mEq/L (ref 135–145)

## 2014-02-14 NOTE — Assessment & Plan Note (Signed)
Slight difference in kidney function at recent ER visit and will recent. BP mildly elevated today but acceptable. Given ACE-I therapy will chcek BMP today.

## 2014-02-14 NOTE — Assessment & Plan Note (Signed)
Will refer to podiatry for removal of ingrown toenail.

## 2014-02-14 NOTE — Patient Instructions (Signed)
We will check your blood work for your kidneys to make sure they are back to your normal.   We will send you to the foot doctor. If you have any pain in your neck you can use heating pads on it.

## 2014-02-14 NOTE — Progress Notes (Signed)
Pre visit review using our clinic review tool, if applicable. No additional management support is needed unless otherwise documented below in the visit note. 
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
## 2014-03-13 ENCOUNTER — Encounter: Payer: Self-pay | Admitting: Podiatry

## 2014-03-13 ENCOUNTER — Ambulatory Visit (INDEPENDENT_AMBULATORY_CARE_PROVIDER_SITE_OTHER): Payer: Medicare Other | Admitting: Podiatry

## 2014-03-13 DIAGNOSIS — B351 Tinea unguium: Secondary | ICD-10-CM

## 2014-03-13 DIAGNOSIS — M79675 Pain in left toe(s): Secondary | ICD-10-CM

## 2014-03-13 DIAGNOSIS — M79674 Pain in right toe(s): Secondary | ICD-10-CM

## 2014-03-13 DIAGNOSIS — M79673 Pain in unspecified foot: Secondary | ICD-10-CM

## 2014-03-15 NOTE — Progress Notes (Signed)
Subjective:     Patient ID: Evan Escobar, male   DOB: 1940-03-11, 74 y.o.   MRN: [id number]  HPI patient is noted to have thick yellow nailbeds 1-5 both feet that he's been having trouble cutting and they're becoming increasingly tender with shoe gear   Review of Systems     Objective:   Physical Exam Neurovascular status unchanged with thick yellow brittle nailbeds 1-5 both feet that I noted to be dystrophic and painful when pressed from a dorsal direction with incurvated nail corners    Assessment:     Mycotic nail infection with pain 1-5 both feet    Plan:     Debride painful nailbeds 1-5 both feet with no iatrogenic bleeding and discussed continued conservative care

## 2014-03-21 ENCOUNTER — Other Ambulatory Visit: Payer: Self-pay | Admitting: Internal Medicine

## 2014-03-21 ENCOUNTER — Ambulatory Visit: Payer: Medicare HMO | Admitting: Nurse Practitioner

## 2014-03-25 ENCOUNTER — Other Ambulatory Visit: Payer: Self-pay | Admitting: Neurology

## 2014-04-21 ENCOUNTER — Other Ambulatory Visit: Payer: Self-pay | Admitting: Internal Medicine

## 2014-04-22 ENCOUNTER — Other Ambulatory Visit: Payer: Self-pay | Admitting: Internal Medicine

## 2014-04-26 ENCOUNTER — Ambulatory Visit (INDEPENDENT_AMBULATORY_CARE_PROVIDER_SITE_OTHER): Payer: Medicare Other | Admitting: Internal Medicine

## 2014-04-26 ENCOUNTER — Encounter: Payer: Self-pay | Admitting: Internal Medicine

## 2014-04-26 VITALS — BP 130/86 | HR 67 | Temp 98.3°F | Wt 159.0 lb

## 2014-04-26 DIAGNOSIS — K5900 Constipation, unspecified: Secondary | ICD-10-CM

## 2014-04-26 NOTE — Progress Notes (Signed)
   Subjective:    Patient ID: Evan Escobar, male    DOB: [birth year], 74 y.o.   MRN: [id number]  HPI The patient is a 74 YO man who is coming in today to ask some questions about his lab results for his high blood pressure. He is also having some intermittent constipation. This is a new problem he has had off and on for years. He has been using milk of magnesia when he needs it. He does not eat much fiber in his diet and has never tried it over the counter. Denies any blood in his stool and recently had a home screening which was negative for blood. Due for next colonoscopy in 2018 (last in 2013).  Review of Systems  Constitutional: Negative for fever, chills, activity change, appetite change and fatigue.  Respiratory: Negative for cough, chest tightness, shortness of breath and wheezing.   Cardiovascular: Negative for chest pain, palpitations and leg swelling.  Gastrointestinal: Positive for constipation. Negative for abdominal pain, diarrhea, blood in stool and abdominal distention.  Musculoskeletal: Positive for arthralgias. Negative for myalgias, back pain and neck pain.  Skin: Negative.   Neurological: Negative.       Objective:   Physical Exam  Constitutional: He is oriented to person, place, and time. He appears well-developed and well-nourished. No distress.  HENT:  Head: Normocephalic and atraumatic.  Eyes: EOM are normal.  Neck: Normal range of motion. Neck supple.  Cardiovascular: Normal rate and regular rhythm.   Pulmonary/Chest: Effort normal and breath sounds normal.  Abdominal: Soft.  Musculoskeletal: He exhibits no edema or tenderness.  Lymphadenopathy:    He has no cervical adenopathy.  Neurological: He is alert and oriented to person, place, and time. No cranial nerve deficit. Coordination normal.   Filed Vitals:   04/26/14 0758  BP: 130/86  Pulse: 67  Temp: 98.3 F (36.8 C)  TempSrc: Oral  Weight: 159 lb (72.122 kg)  SpO2: 96%      Assessment & Plan:

## 2014-04-26 NOTE — Progress Notes (Signed)
Pre visit review using our clinic review tool, if applicable. No additional management support is needed unless otherwise documented below in the visit note. 

## 2014-04-26 NOTE — Assessment & Plan Note (Signed)
New problem today, recommended he add fiber to his diet first. Then try OTC fiber. Then miralax if still having problems.

## 2014-04-26 NOTE — Patient Instructions (Signed)
Your lab results are normal and you are not due for another colon cancer screening until 2018.  I would recommend adding some fiber to your diet with the foods you eat or with some fiber over the counter such as benefiber or metamucil (or the generic of these).   If you are still having problems with constipation you can try miralax (also over the counter, ask the pharmacist if you have trouble finding it) which helps to move the bowels faster.   High-Fiber Diet Fiber is found in fruits, vegetables, and grains. A high-fiber diet encourages the addition of more whole grains, legumes, fruits, and vegetables in your diet. The recommended amount of fiber for adult males is 38 g per day. For adult females, it is 25 g per day. Pregnant and lactating women should get 28 g of fiber per day. If you have a digestive or bowel problem, ask your caregiver for advice before adding high-fiber foods to your diet. Eat a variety of high-fiber foods instead of only a select few type of foods.  PURPOSE  To increase stool bulk.  To make bowel movements more regular to prevent constipation.  To lower cholesterol.  To prevent overeating. WHEN IS THIS DIET USED?  It may be used if you have constipation and hemorrhoids.  It may be used if you have uncomplicated diverticulosis (intestine condition) and irritable bowel syndrome.  It may be used if you need help with weight management.  It may be used if you want to add it to your diet as a protective measure against atherosclerosis, diabetes, and cancer. SOURCES OF FIBER  Whole-grain breads and cereals.  Fruits, such as apples, oranges, bananas, berries, prunes, and pears.  Vegetables, such as green peas, carrots, sweet potatoes, beets, broccoli, cabbage, spinach, and artichokes.  Legumes, such split peas, soy, lentils.  Almonds. FIBER CONTENT IN FOODS Starches and Grains / Dietary Fiber (g)  Cheerios, 1 cup / 3 g  Corn Flakes cereal, 1 cup / 0.7  g  Rice crispy treat cereal, 1 cup / 0.3 g  Instant oatmeal (cooked),  cup / 2 g  Frosted wheat cereal, 1 cup / 5.1 g  Brown, long-grain rice (cooked), 1 cup / 3.5 g  White, long-grain rice (cooked), 1 cup / 0.6 g  Enriched macaroni (cooked), 1 cup / 2.5 g Legumes / Dietary Fiber (g)  Baked beans (canned, plain, or vegetarian),  cup / 5.2 g  Kidney beans (canned),  cup / 6.8 g  Pinto beans (cooked),  cup / 5.5 g Breads and Crackers / Dietary Fiber (g)  Plain or honey graham crackers, 2 squares / 0.7 g  Saltine crackers, 3 squares / 0.3 g  Plain, salted pretzels, 10 pieces / 1.8 g  Whole-wheat bread, 1 slice / 1.9 g  White bread, 1 slice / 0.7 g  Raisin bread, 1 slice / 1.2 g  Plain bagel, 3 oz / 2 g  Flour tortilla, 1 oz / 0.9 g  Corn tortilla, 1 small / 1.5 g  Hamburger or hotdog bun, 1 small / 0.9 g Fruits / Dietary Fiber (g)  Apple with skin, 1 medium / 4.4 g  Sweetened applesauce,  cup / 1.5 g  Banana,  medium / 1.5 g  Grapes, 10 grapes / 0.4 g  Orange, 1 small / 2.3 g  Raisin, 1.5 oz / 1.6 g  Melon, 1 cup / 1.4 g Vegetables / Dietary Fiber (g)  Green beans (canned),  cup / 1.3 g  Carrots (cooked),  cup / 2.3 g  Broccoli (cooked),  cup / 2.8 g  Peas (cooked),  cup / 4.4 g  Mashed potatoes,  cup / 1.6 g  Lettuce, 1 cup / 0.5 g  Corn (canned),  cup / 1.6 g  Tomato,  cup / 1.1 g Document Released: 02/03/2005 Document Revised: 08/05/2011 Document Reviewed: 05/08/2011 [employer] Patient Information 2015 [place], [place]. This information is not intended to replace advice given to you by your health care provider. Make sure you discuss any questions you have with your health care provider.

## 2014-05-15 ENCOUNTER — Ambulatory Visit: Payer: Managed Care, Other (non HMO) | Admitting: Internal Medicine

## 2014-05-23 ENCOUNTER — Other Ambulatory Visit: Payer: Self-pay | Admitting: Internal Medicine

## 2014-06-06 ENCOUNTER — Ambulatory Visit (INDEPENDENT_AMBULATORY_CARE_PROVIDER_SITE_OTHER): Payer: Medicare Other | Admitting: Nurse Practitioner

## 2014-06-06 ENCOUNTER — Encounter: Payer: Self-pay | Admitting: Nurse Practitioner

## 2014-06-06 VITALS — BP 150/61 | HR 61 | Ht 67.0 in | Wt 158.2 lb

## 2014-06-06 DIAGNOSIS — R569 Unspecified convulsions: Secondary | ICD-10-CM

## 2014-06-06 MED ORDER — LEVETIRACETAM 500 MG PO TABS: 500.0000 mg | ORAL_TABLET | Freq: Two times a day (BID) | ORAL | Status: DC

## 2014-06-06 NOTE — Patient Instructions (Signed)
Continue Keppra at the current dose will refill Do not miss doses of medication Given information on seizure Please remember, common seizure triggers are: Sleep deprivation, dehydration, overheating, stress, hypoglycemia or skipping meals, certain medications or excessive alcohol use, especially stopping alcohol abruptly if you have had heavy alcohol use before (aka alcohol withdrawal seizure). If you have a prolonged seizure over 2-5 minutes or back to back seizures, call or have someone call 911 or take you to the nearest emergency room. You cannot drive a car or operate any other machinery or vehicle within 6 months of a seizure. Please do not swim alone or take a tub bath for safety. Do not cook with large quantities of boiling water or oil for safety. Take your medicine for seizure prevention regularly and do not skip doses or stop medication abruptly and tone are told to do so by your healthcare provider. Try to get a refill on your antiepileptic medication ahead of time, so you are not at risk of running out. If you run out of the seizure medication and do not have a refill at hand she may run into medication withdrawal seizures. Avoid taking Wellbutrin, narcotic pain medications and tramadol, as they can lower seizure threshold.  Call for any seizure activity Follow-up yearly and when necessary Will assign to Dr. Krista Blue

## 2014-06-06 NOTE — Progress Notes (Signed)
[facility]  PATIENT: Evan Escobar DOB: 06/05/1940   REASON FOR VISIT: Follow-up for generalized seizure disorder  HISTORY FROM: Patient    HISTORY: Evan Escobar is a 74 y.o. male here as a follow up. He was last seen in this office 09/14/2013 by Dr. Janann Colonel who is no longer with the practice. He has a history of generalized seizure event without warning which occurred in July 2014. He is currently taking Keppra 500mg  twice a day and tolerating it well. He does claim to missing doses at times. He denies further seizure episodes  no LOC. No aura or abnormal sensations. No dizziness, no light headed, no confusion, no gait instability. Currently driving, no issues with this. He is sleeping well at night. He has questions about seizure triggers. He returns for reevaluation. He needs refills  Initial visit 09/2012: Are one month ago he remembers being in bed the next thing he remembers is waking up in the ER. He has no other recollection of the event. Reports the day of the normal day. Denies any aura or warning prior to the event. Per his wife who presents with a history he was having generalized tonic-clonic movements of all extremities, eyes rolled back, foaming at the mouth with a groaning moaning sound. She feels it lasted around 5-10 minutes of shaking, he was confused after for around one hour before returning to baseline. Denies any tongue biting wife notes some urinary incontinence. Has had no prior episodes similar to this. Both patient and his wife to describe a few prior episodes of the past year where he would get worse sensation in his left arm and then would have staring episodes and zone out. There is episodes he would not talk or of nonsensical speech. His last a few minutes and then he returned to baseline. Otherwise unremarkable past medical history. Denies any head, history of cancer. No prior history of seizure no family history of seizure no febrile seizures. Saw  a cardiologist for workup and told everything was fine from a cardiac standpoint. Quit smoking years ago, was a light smoker. History of EtOH use but doesn't drink currently. Head CT from 08/2012 images reviewed and found to be unremarkable.   REVIEW OF SYSTEMS: Full 14 system review of systems performed and notable only for those listed, all others are neg:  Constitutional: neg  Cardiovascular: neg Ear/Nose/Throat: Allergies  Skin: neg Eyes: neg Respiratory: neg Gastroitestinal: neg  Hematology/Lymphatic: neg  Endocrine: neg Musculoskeletal:neg Allergy/Immunology: neg Neurological: neg Psychiatric: neg Sleep : neg   ALLERGIES: No Known Allergies  HOME MEDICATIONS: Outpatient Prescriptions Prior to Visit  Medication Sig Dispense Refill  . aspirin 81 MG tablet Take 81 mg by mouth daily.    . cycloSPORINE (RESTASIS) 0.05 % ophthalmic emulsion Place 1 drop into both eyes 2 (two) times daily.    Marland Kitchen doxazosin (CARDURA) 4 MG tablet TAKE 1 TABLET BY MOUTH DAILY 30 tablet 0  . latanoprost (XALATAN) 0.005 % ophthalmic solution     . levETIRAcetam (KEPPRA) 500 MG tablet TAKE 1 TABLET BY MOUTH TWICE DAILY 60 tablet 1  . lisinopril (PRINIVIL,ZESTRIL) 20 MG tablet TAKE 1 TABLET EVERY DAY 30 tablet 0  . metoprolol succinate (TOPROL-XL) 25 MG 24 hr tablet TAKE 1 TABLET BY MOUTH EVERY DAY 30 tablet 0  . Multiple Vitamin (MULTIVITAMIN) tablet Take 1 tablet by mouth daily.     No facility-administered medications prior to visit.    PAST MEDICAL HISTORY: Past Medical History  Diagnosis Date  .  Hypertension   . Glaucoma   . Polyp, sigmoid colon 11/05/2011  . Seizure   . Normocytic anemia 09/25/2012  . Seizures 03/17/2013    PAST SURGICAL HISTORY: Past Surgical History  Procedure Laterality Date  . Glaucoma repair  10 yrs ago  . Cataract extraction, bilateral      FAMILY HISTORY: Family History  Problem Relation Age of Onset  . Colon cancer Neg Hx   . Stroke Mother   . Stroke  Father   . High blood pressure Mother   . High blood pressure Father     SOCIAL HISTORY: History   Social History  . Marital Status: Married    Spouse Name: Terrence Dupont  . Number of Children: 1  . Years of Education: 11   Occupational History  . retired    Social History Main Topics  . Smoking status: Former Smoker -- 1.00 packs/day    Types: Cigarettes    Quit date: 02/18/2007  . Smokeless tobacco: Never Used  . Alcohol Use: No     Comment: quit in 2009  . Drug Use: No  . Sexual Activity: Not on file   Other Topics Concern  . Not on file   Social History Narrative   Patient lives at home with his wife Terrence Dupont). Patient is retired. Patient has 11 th grade education.    Caffeine- one cup of coffee daily and one soda.   Right handed.     PHYSICAL EXAM  Filed Vitals:   06/06/14 0815  BP: 150/61  Pulse: 61  Height: 5\' 7"  (1.702 m)  Weight: 158 lb 3.2 oz (71.759 kg)   Body mass index is 24.77 kg/(m^2).  Generalized: Well developed, in no acute distress  Head: normocephalic and atraumatic,. Oropharynx benign  Neck: Supple, no carotid bruits  Cardiac: Regular rate rhythm, no murmur  Musculoskeletal: No deformity   Neurological examination   Mentation: Alert oriented to time, place, history taking. Attention span and concentration appropriate. Recent and remote memory intact.  Follows all commands speech and language fluent.   Cranial nerve II-XII: Pupils were equal round reactive to light extraocular movements were full, visual field were full on confrontational test. Facial sensation and strength were normal. hearing was intact to finger rubbing bilaterally. Uvula tongue midline. head turning and shoulder shrug were normal and symmetric.Tongue protrusion into cheek strength was normal. Motor: normal bulk and tone, full strength in the BUE, BLE, fine finger movements normal, no pronator drift. No focal weakness Sensory: normal and symmetric to light touch, pinprick, and   Vibration, proprioception  Coordination: finger-nose-finger, heel-to-shin bilaterally, no dysmetria Reflexes: Brachioradialis 2/2, biceps 2/2, triceps 2/2, patellar 2/2, Achilles 2/2, plantar responses were flexor bilaterally. Gait and Station: Rising up from seated position without assistance, normal stance,  moderate stride, good arm swing, smooth turning, able to perform tiptoe, and heel walking without difficulty. Tandem gait is steady. No assistive device  DIAGNOSTIC DATA (LABS, IMAGING, TESTING) - I reviewed patient records, labs, notes, testing and imaging myself where available.  Lab Results  Component Value Date   WBC 4.3 02/08/2014   HGB 11.6* 02/08/2014   HCT 35.8* 02/08/2014   MCV 82.5 02/08/2014   PLT 165 02/08/2014      Component Value Date/Time   NA 140 02/14/2014 0842   K 4.2 02/14/2014 0842   CL 104 02/14/2014 0842   CO2 30 02/14/2014 0842   GLUCOSE 91 02/14/2014 0842   BUN 17 02/14/2014 0842   CREATININE 1.2 02/14/2014 5465  CALCIUM 9.1 02/14/2014 0842   PROT 6.8 02/08/2014 2125   ALBUMIN 3.6 02/08/2014 2125   AST 22 02/08/2014 2125   ALT 11 02/08/2014 2125   ALKPHOS 36* 02/08/2014 2125   BILITOT 0.2* 02/08/2014 2125   GFRNONAA 9* 02/08/2014 2125   GFRAA 75* 02/08/2014 2125       ASSESSMENT AND PLAN  74 y.o. year old male  has a past medical history of Hypertension; generalized seizure; Normocytic anemia (09/25/2012); here to follow-up. He has knowledge deficit regarding seizure triggers  Continue Keppra at the current dose will refill Do not miss doses of medication take every 12 hours Given information on seizure precautions at home Please remember, common seizure triggers are: Sleep deprivation, dehydration, overheating, stress, hypoglycemia or skipping meals, certain medications or excessive alcohol use, especially stopping alcohol abruptly if you have had heavy alcohol use before (aka alcohol withdrawal seizure). If you have a prolonged seizure  over 2-5 minutes or back to back seizures, call or have someone call 911 or take you to the nearest emergency room. You cannot drive a car or operate any other machinery or vehicle within 6 months of a seizure. Please do not swim alone or take a tub bath for safety. Do not cook with large quantities of boiling water or oil for safety. Take your medicine for seizure prevention regularly and do not skip doses or stop medication abruptly and tone are told to do so by your healthcare provider. Try to get a refill on your antiepileptic medication ahead of time, so you are not at risk of running out. If you run out of the seizure medication and do not have a refill at hand she may run into medication withdrawal seizures. Avoid taking Wellbutrin, narcotic pain medications and tramadol, as they can lower seizure threshold.  Call for any seizure activity Follow-up yearly and when necessary Will assign to Dr. Luan Pulling, [facility], [facility], APRN  [facility] [address], [place], [place] [address] [phone]

## 2014-06-08 NOTE — Progress Notes (Signed)
I have reviewed and agreed above plan. 

## 2014-06-12 ENCOUNTER — Ambulatory Visit: Payer: Medicare Other

## 2014-06-13 ENCOUNTER — Ambulatory Visit (INDEPENDENT_AMBULATORY_CARE_PROVIDER_SITE_OTHER): Payer: Medicare Other

## 2014-06-13 DIAGNOSIS — M79673 Pain in unspecified foot: Secondary | ICD-10-CM

## 2014-06-13 DIAGNOSIS — B351 Tinea unguium: Secondary | ICD-10-CM

## 2014-06-16 NOTE — Progress Notes (Signed)
Subjective:     Patient ID: Evan Escobar, male   DOB: 1941-01-18, 74 y.o.   MRN: [id number]  HPI patient is noted to have thick yellow nailbeds 1-5 both feet that he's been having trouble cutting and they're becoming increasingly tender with shoe gear   Review of Systems     Objective:   Physical Exam Neurovascular status unchanged with thick yellow brittle nailbeds 1-5 both feet that I noted to be dystrophic and painful when pressed from a dorsal direction with incurvated nail corners    Assessment:     Mycotic nail infection with pain 1-5 both feet    Plan:     Debride painful nailbeds 1-5 both feet with no iatrogenic bleeding and discussed continued conservative care

## 2014-06-23 ENCOUNTER — Other Ambulatory Visit: Payer: Self-pay | Admitting: Internal Medicine

## 2014-06-25 ENCOUNTER — Other Ambulatory Visit: Payer: Self-pay | Admitting: Internal Medicine

## 2014-07-20 DIAGNOSIS — H34833 Tributary (branch) retinal vein occlusion, bilateral: Secondary | ICD-10-CM | POA: Diagnosis not present

## 2014-07-20 DIAGNOSIS — H16223 Keratoconjunctivitis sicca, not specified as Sjogren's, bilateral: Secondary | ICD-10-CM | POA: Diagnosis not present

## 2014-07-20 DIAGNOSIS — H35033 Hypertensive retinopathy, bilateral: Secondary | ICD-10-CM | POA: Diagnosis not present

## 2014-07-22 ENCOUNTER — Other Ambulatory Visit: Payer: Self-pay | Admitting: Internal Medicine

## 2014-07-27 ENCOUNTER — Other Ambulatory Visit: Payer: Self-pay | Admitting: Internal Medicine

## 2014-07-27 ENCOUNTER — Encounter: Payer: Self-pay | Admitting: Gastroenterology

## 2014-08-11 ENCOUNTER — Telehealth: Payer: Self-pay

## 2014-08-11 NOTE — Telephone Encounter (Signed)
Call to introduce AWV and the patient agreed. LIkes 8am apt, scheduled for Thursday June 30th at 8am.

## 2014-08-17 ENCOUNTER — Ambulatory Visit (INDEPENDENT_AMBULATORY_CARE_PROVIDER_SITE_OTHER): Payer: Medicare Other

## 2014-08-17 VITALS — BP 146/80 | HR 62 | Ht 67.0 in | Wt 152.5 lb

## 2014-08-17 DIAGNOSIS — Z23 Encounter for immunization: Secondary | ICD-10-CM

## 2014-08-17 DIAGNOSIS — Z0000 Encounter for general adult medical examination without abnormal findings: Secondary | ICD-10-CM | POA: Diagnosis not present

## 2014-08-17 NOTE — Patient Instructions (Addendum)
Evan Escobar , Thank you for taking time to come for your Medicare Wellness Visit. I appreciate your ongoing commitment to your health goals. Please review the following plan we discussed and let me know if I can assist you in the future.   Will take Tetanus today (TDAP) Will review Advanced Directives information Will check with insurer regarding assistance for hearing loss for hearing check  Can't afford hearing aids or audiology visit at the present time; Given resources to see if he can get assistance [facility] [address] [place], [place] [address] Phone: [phone] / [phone] Video Phone: [phone] TTY: [phone] /  Fax: [phone]: [place], [place], [place], [place], [place], [place], [place], [place], Adline Potter and [facility] Staff  These are the goals we discussed: Goals    . maintain your health     Will continue walking at mall x 2 days a week x 60 minutes; Enjoys working on cars; Plays the guitar for relaxation       This is a list of the screening recommended for you and due dates:  Health Maintenance  Topic Date Due  . Tetanus Vaccine  07/25/1960  . Shingles Vaccine  07/25/2001  . Pneumonia vaccines (1 of 2 - PCV13) 07/26/2006  . Flu Shot  09/18/2014  . Colon Cancer Screening  11/04/2016     Fall Prevention and Home Safety Falls cause injuries and can affect all age groups. It is possible to prevent falls.  HOW TO PREVENT FALLS  Wear shoes with rubber soles that do not have an opening for your toes.  Keep the inside and outside of your house well lit.  Use night lights throughout your home.  Remove clutter from floors.  Clean up floor spills.  Remove throw rugs or fasten them to the floor with carpet tape.  Do not place electrical cords across pathways.  Put grab bars by your tub, shower, and toilet. Do not use towel bars as grab bars.  Put handrails on  both sides of the stairway. Fix loose handrails.  Do not climb on stools or stepladders, if possible.  Do not wax your floors.  Repair uneven or unsafe sidewalks, walkways, or stairs.  Keep items you use a lot within reach.  Be aware of pets.  Keep emergency numbers next to the telephone.  Put smoke detectors in your home and near bedrooms. Ask your doctor what other things you can do to prevent falls. Document Released: 11/30/2008 Document Revised: 08/05/2011 Document Reviewed: 05/06/2011 [facility] Patient Information 2015 [place], [place]. This information is not intended to replace advice given to you by your health care provider. Make sure you discuss any questions you have with your health care provider.  Glaucoma Glaucoma happens when the fluid pressure in the eyeball is too high. The pressure cannot stay high for too long, or the eyeball may become damaged. Signs of glaucoma include:  Having a hard time seeing in a dark room after being in a bright one.  Having trouble seeing things out to the sides of you.  Blurry sight.  Seeing bright white lights or colors in front of your eyes.  Headaches.  Feeling sick to your stomach (nauseous) or throwing up (vomiting).  Sudden vision loss. Glaucoma testing is an important part of taking care of your eyesight. HOME CARE  Always use your eyedrops or pills as told by your doctor. Do not run out.  Do not go  away from home without your eyedrops or pills.  Keep your appointments.  Always tell a new doctor that you have glaucoma and how long you have had it. Tell the doctor about the eyedrops and pills you take.  Do not use eyedrops or pills that have not been prescribed by your doctor. GET HELP RIGHT AWAY IF:  You develop severe pain in the affected eye.  You develop vision problems.  You develop a bad headache in the area around the eye.  You feel sick to your stomach or throw up.  You start to have problems with the  other eye. MAKE SURE YOU:   Understand these instructions.  Will watch your condition.  Will get help right away if you are not doing well or get worse. Document Released: 11/13/2007 Document Revised: 04/28/2011 Document Reviewed: 11/13/2007 [facility] Patient Information 2015 [place], [place]. This information is not intended to replace advice given to you by your health care provider. Make sure you discuss any questions you have with your health care provider.  Health Maintenance A healthy lifestyle and preventative care can promote health and wellness.  Maintain regular health, dental, and eye exams.  Eat a healthy diet. Foods like vegetables, fruits, whole grains, low-fat dairy products, and lean protein foods contain the nutrients you need and are low in calories. Decrease your intake of foods high in solid fats, added sugars, and salt. Get information about a proper diet from your health care provider, if necessary.  Regular physical exercise is one of the most important things you can do for your health. Most adults should get at least 150 minutes of moderate-intensity exercise (any activity that increases your heart rate and causes you to sweat) each week. In addition, most adults need muscle-strengthening exercises on 2 or more days a week.   Maintain a healthy weight. The body mass index (BMI) is a screening tool to identify possible weight problems. It provides an estimate of body fat based on height and weight. Your health care provider can find your BMI and can help you achieve or maintain a healthy weight. For males 20 years and older:  A BMI below 18.5 is considered underweight.  A BMI of 18.5 to 24.9 is normal.  A BMI of 25 to 29.9 is considered overweight.  A BMI of 30 and above is considered obese.  Maintain normal blood lipids and cholesterol by exercising and minimizing your intake of saturated fat. Eat a balanced diet with plenty of fruits and vegetables. Blood tests  for lipids and cholesterol should begin at age 24 and be repeated every 5 years. If your lipid or cholesterol levels are high, you are over age 70, or you are at high risk for heart disease, you may need your cholesterol levels checked more frequently.Ongoing high lipid and cholesterol levels should be treated with medicines if diet and exercise are not working.  If you smoke, find out from your health care provider how to quit. If you do not use tobacco, do not start.  Lung cancer screening is recommended for adults aged 84-80 years who are at high risk for developing lung cancer because of a history of smoking. A yearly low-dose CT scan of the lungs is recommended for people who have at least a 30-pack-year history of smoking and are current smokers or have quit within the past 15 years. A pack year of smoking is smoking an average of 1 pack of cigarettes a day for 1 year (for example, a 30-pack-year  history of smoking could mean smoking 1 pack a day for 30 years or 2 packs a day for 15 years). Yearly screening should continue until the smoker has stopped smoking for at least 15 years. Yearly screening should be stopped for people who develop a health problem that would prevent them from having lung cancer treatment.  If you choose to drink alcohol, do not have more than 2 drinks per day. One drink is considered to be 12 oz (360 mL) of beer, 5 oz (150 mL) of wine, or 1.5 oz (45 mL) of liquor.  Avoid the use of street drugs. Do not share needles with anyone. Ask for help if you need support or instructions about stopping the use of drugs.  High blood pressure causes heart disease and increases the risk of stroke. Blood pressure should be checked at least every 1-2 years. Ongoing high blood pressure should be treated with medicines if weight loss and exercise are not effective.  If you are 69-24 years old, ask your health care provider if you should take aspirin to prevent heart disease.  Diabetes  screening involves taking a blood sample to check your fasting blood sugar level. This should be done once every 3 years after age 12 if you are at a normal weight and without risk factors for diabetes. Testing should be considered at a younger age or be carried out more frequently if you are overweight and have at least 1 risk factor for diabetes.  Colorectal cancer can be detected and often prevented. Most routine colorectal cancer screening begins at the age of 53 and continues through age 78. However, your health care provider may recommend screening at an earlier age if you have risk factors for colon cancer. On a yearly basis, your health care provider may provide home test kits to check for hidden blood in the stool. A small camera at the end of a tube may be used to directly examine the colon (sigmoidoscopy or colonoscopy) to detect the earliest forms of colorectal cancer. Talk to your health care provider about this at age 54 when routine screening begins. A direct exam of the colon should be repeated every 5-10 years through age 54, unless early forms of precancerous polyps or small growths are found.  People who are at an increased risk for hepatitis B should be screened for this virus. You are considered at high risk for hepatitis B if:  You were born in a country where hepatitis B occurs often. Talk with your health care provider about which countries are considered high risk.  Your parents were born in a high-risk country and you have not received a shot to protect against hepatitis B (hepatitis B vaccine).  You have HIV or AIDS.  You use needles to inject street drugs.  You live with, or have sex with, someone who has hepatitis B.  You are a man who has sex with other men (MSM).  You get hemodialysis treatment.  You take certain medicines for conditions like cancer, organ transplantation, and autoimmune conditions.  Hepatitis C blood testing is recommended for all people born  from [birth year] through 1964 and any individual with known risk factors for hepatitis C.  Healthy men should no longer receive prostate-specific antigen (PSA) blood tests as part of routine cancer screening. Talk to your health care provider about prostate cancer screening.  Testicular cancer screening is not recommended for adolescents or adult males who have no symptoms. Screening includes self-exam, a health care provider  exam, and other screening tests. Consult with your health care provider about any symptoms you have or any concerns you have about testicular cancer.  Practice safe sex. Use condoms and avoid high-risk sexual practices to reduce the spread of sexually transmitted infections (STIs).  You should be screened for STIs, including gonorrhea and chlamydia if:  You are sexually active and are younger than 24 years.  You are older than 24 years, and your health care provider tells you that you are at risk for this type of infection.  Your sexual activity has changed since you were last screened, and you are at an increased risk for chlamydia or gonorrhea. Ask your health care provider if you are at risk.  If you are at risk of being infected with HIV, it is recommended that you take a prescription medicine daily to prevent HIV infection. This is called pre-exposure prophylaxis (PrEP). You are considered at risk if:  You are a man who has sex with other men (MSM).  You are a heterosexual man who is sexually active with multiple partners.  You take drugs by injection.  You are sexually active with a partner who has HIV.  Talk with your health care provider about whether you are at high risk of being infected with HIV. If you choose to begin PrEP, you should first be tested for HIV. You should then be tested every 3 months for as long as you are taking PrEP.  Use sunscreen. Apply sunscreen liberally and repeatedly throughout the day. You should seek shade when your shadow is shorter  than you. Protect yourself by wearing long sleeves, pants, a wide-brimmed hat, and sunglasses year round whenever you are outdoors.  Tell your health care provider of new moles or changes in moles, especially if there is a change in shape or color. Also, tell your health care provider if a mole is larger than the size of a pencil eraser.  A one-time screening for abdominal aortic aneurysm (AAA) and surgical repair of large AAAs by ultrasound is recommended for men aged 42-75 years who are current or former smokers.  Stay current with your vaccines (immunizations). Document Released: 08/02/2007 Document Revised: 02/08/2013 Document Reviewed: 07/01/2010 [facility] Patient Information 2015 [place], [place]. This information is not intended to replace advice given to you by your health care provider. Make sure you discuss any questions you have with your health care provider.  Hearing Loss A hearing loss is sometimes called deafness. Hearing loss may be partial or total. CAUSES Hearing loss may be caused by:  Wax in the ear canal.  Infection of the ear canal.  Infection of the middle ear.  Trauma to the ear or surrounding area.  Fluid in the middle ear.  A hole in the eardrum (perforated eardrum).  Exposure to loud sounds or music.  Problems with the hearing nerve.  Certain medications. Hearing loss without wax, infection, or a history of injury may mean that the nerve is involved. Hearing loss with severe dizziness, nausea and vomiting or ringing in the ear may suggest a hearing nerve irritation or problems in the middle or inner ear. If hearing loss is untreated, there is a greater likelihood for residual or permanent hearing loss. DIAGNOSIS A hearing test (audiometry) assesses hearing loss. The audiometry test needs to be performed by a hearing specialist (audiologist). TREATMENT Treatment for recent onset of hearing loss may include:  Ear wax removal.  Medications that kill germs  (antibiotics).  Cortisone medications.  Prompt follow  up with the appropriate specialist. Return of hearing depends on the cause of your hearing loss, so proper medical follow-up is important. Some hearing loss may not be reversible, and a caregiver should discuss care and treatment options with you. SEEK MEDICAL CARE IF:   You have a severe headache, dizziness, or changes in vision.  You have new or increased weakness.  You develop repeated vomiting or other serious medical problems.  You have a fever. Document Released: 02/03/2005 Document Revised: 04/28/2011 Document Reviewed: 05/31/2009 [facility] Patient Information 2015 [place], [place]. This information is not intended to replace advice given to you by your health care provider. Make sure you discuss any questions you have with your health care provider.

## 2014-08-17 NOTE — Progress Notes (Signed)
Subjective:   Evan Escobar is a 74 y.o. male who presents for Medicare Annual/Subsequent preventive examination.  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;  Hx of seizure July 2014; on Keppra; no more seizures since that time per the patient He does drive;  Health  Lipids: Chol 190; Trig 38; HDL 58; LDL 124; ratio 3 Glucose 120; hospital encounter BMI: normal;  Diet; meals meat and vegetables; have breakfast;  Exercise; Go walk at mall; [place] with wife; walks an hour   Family Hx; sister and brother w diabetes; Mother had stroke; Father stroke;  Both had HTN Social history: lives with wife Has one child One level; Educated on making home safe as they age in place Would like copy of AD    Personalized education given regarding risk Patient wants to maintain; no stress; Tries not to worry about "nothing"  Psychosocial support; safe community; firearm safety; smoke alarms;   Discussed Goal to improve health based on risk  Gave information on safety to take home;   Current Care Team reviewed and updated  Dr. Hassell Done Dr. weaver       Objective:    Vitals: BP 146/80 mmHg  Pulse 62  Ht 5\' 7"  (1.702 m)  Wt 152 lb 8 oz (69.174 kg)  BMI 23.88 kg/m2  SpO2 97%  Tobacco History  Smoking status  . Former Smoker -- 1.00 packs/day  . Types: Cigarettes  . Quit date: 02/18/2007  Smokeless tobacco  . Never Used    Comment: not sure but a while     Counseling given: Not Answered   Past Medical History  Diagnosis Date  . Hypertension   . Glaucoma   . Polyp, sigmoid colon 11/05/2011  . Seizure   . Normocytic anemia 09/25/2012  . Seizures 03/17/2013   Past Surgical History  Procedure Laterality Date  . Glaucoma repair  10 yrs ago  . Cataract extraction, bilateral     Family History  Problem Relation Age of Onset  . Colon  cancer Neg Hx   . Stroke Mother   . Stroke Father   . High blood pressure Mother   . High blood pressure Father    History  Sexual Activity  . Sexual Activity: Not on file    Outpatient Encounter Prescriptions as of 08/17/2014  Medication Sig  . aspirin 81 MG tablet Take 81 mg by mouth daily.  . cycloSPORINE (RESTASIS) 0.05 % ophthalmic emulsion Place 1 drop into both eyes 2 (two) times daily.  Marland Kitchen doxazosin (CARDURA) 4 MG tablet TAKE 1 TABLET BY MOUTH DAILY  . latanoprost (XALATAN) 0.005 % ophthalmic solution   . levETIRAcetam (KEPPRA) 500 MG tablet Take 1 tablet (500 mg total) by mouth 2 (two) times daily.  Marland Kitchen lisinopril (PRINIVIL,ZESTRIL) 20 MG tablet TAKE 1 TABLET BY MOUTH EVERY DAY  . metoprolol succinate (TOPROL-XL) 25 MG 24 hr tablet TAKE 1 TABLET BY MOUTH EVERY DAY  . Multiple Vitamin (MULTIVITAMIN) tablet Take 1 tablet by mouth daily.  Marland Kitchen doxazosin (CARDURA) 4 MG tablet TAKE 1 TABLET BY MOUTH DAILY   No facility-administered encounter medications on file as of 08/17/2014.    Activities of Daily Living In your present state of health, do you have any difficulty performing the following activities: 08/17/2014  Hearing? N  Vision? N  Difficulty concentrating or making decisions? N  Walking  or climbing stairs? N  Dressing or bathing? N  Doing errands, shopping? N  Preparing Food and eating ? N  Using the Toilet? N  In the past six months, have you accidently leaked urine? N  Do you have problems with loss of bowel control? N  Managing your Medications? N  Managing your Finances? N  Housekeeping or managing your Housekeeping? N    Patient Care Team: Olga Millers, MD as PCP - General (Internal Medicine)   Assessment:    Objective:   The goal of the wellness visit is to assist the patient how to close the gaps in care and create a preventative care plan for the patient.  Personalized Education was given regarding:   Pt determined a personalized goal; see patient  goals; to continue to walk and enjoy his hobbies  Assessment included: Taking meds without issues; no barriers identified Labs were and fup visit noted with MD if labs are due to be re-drawn. Stress: Recommendations for managing stress if assessed as a factor;  No Risk for hepatitis or high risk social behavior identified via hepatitis screen Educated on shingles and follow up with insurance company for co-pays or charges applied to Part D benefit. Educated on Vaccines; will take tdap today; Will not give pneumonia as no information on which one the patient had x 74 yo  Safety issues reviewed; Cognition assessed by AD8; Score 0 MMSE deferred as the patient stated they had no memory issues; No identified risk were noted; The patient was oriented x 3; appropriate in dress and manner and no objective failures at ADL's or IADL's.  Bowel and bladder issues assessed  Screenings overdue Ophthalmology exam; [facility]; Dr. Kathlen Mody; seen them x 2 months ago; Eyes checked q 4 months Systane for dry eyes/ has laser exam to treat glaucoma (left) and then right.  Using the drops now/ Vison is better;  Immunizations; Pneumonia was taken at [facility] aid x 3 to 74 yo; Shingles/not taken/ does not think he had chicken pox; does not remember; Tetanus due; Agrees to take tetanus this visit Colonoscopy; 2013; 2018 unless otherwise directed by [place]  EKG 02/09/14 Hearing: 1000hz  in right; not determined in left  Dental: no; has upper plate and bottom teeth;  Plan   The patient agrees to:  Advanced directive:  Discuss with Doctor on next fup:  Commit to Goals set;       Exercise Activities and Dietary recommendations    Goals    . maintain your health     Will continue walking at mall x 2 days a week x 60 minutes; Enjoys working on cars; Plays the guitar for relaxation      Fall Risk Fall Risk  08/17/2014  Falls in the past year? No   Depression Screen PHQ 2/9 Scores 08/17/2014  PHQ  - 2 Score 0    Cognitive Testing MMSE - Mini Mental State Exam 08/17/2014  Not completed: Unable to complete    Immunization History  Administered Date(s) Administered  . Influenza, High Dose Seasonal PF 11/17/2012  . Influenza,inj,Quad PF,36+ Mos 11/14/2013   Screening Tests Health Maintenance  Topic Date Due  . TETANUS/TDAP  07/25/1960  . ZOSTAVAX  07/25/2001  . PNA vac Low Risk Adult (1 of 2 - PCV13) 07/26/2006  . INFLUENZA VACCINE  09/18/2014  . COLONOSCOPY  11/04/2016      Plan:     Will take Tetanus today (TDAP) Will review Advanced Directives information Will check with insurer  regarding assistance for hearing loss for hearing check  Can't afford hearing aids or audiology visit at the present time; Given resources to see if he can get assistance [facility] [place], [place] [address] Phone: [phone] / [phone] Video Phone: [phone] TTY: [phone] /  Fax: [phone]: [place], [place], [place], [place], [place], [place], [place], Adline Potter and [facility] Staff    During the course of the visit the patient was educated and counseled about the following appropriate screening and preventive services:   Vaccines to include Pneumoccal, Influenza, Hepatitis B, Td, Zostavax, HCV/ tdap today  Electrocardiogram/   Cardiovascular Disease  Colorectal cancer screening  Diabetes screening  Prostate Cancer Screening  Glaucoma screening  Nutrition counseling   Smoking cessation counseling;remains smoke free  Patient Instructions (the written plan) was given to the patient.    Wynetta Fines, RN  08/17/2014

## 2014-08-18 NOTE — Progress Notes (Signed)
Medical screening examination/treatment/procedure(s) were performed by non-physician practitioner and as supervising physician I was immediately available for consultation/collaboration. I agree with above. Kollar, Rosario Kushner A, MD   

## 2014-08-20 ENCOUNTER — Other Ambulatory Visit: Payer: Self-pay | Admitting: Internal Medicine

## 2014-08-22 ENCOUNTER — Other Ambulatory Visit: Payer: Self-pay | Admitting: Internal Medicine

## 2014-09-12 ENCOUNTER — Ambulatory Visit: Payer: Medicare Other | Admitting: Podiatry

## 2014-09-21 ENCOUNTER — Other Ambulatory Visit: Payer: Self-pay | Admitting: Internal Medicine

## 2014-09-22 ENCOUNTER — Other Ambulatory Visit: Payer: Self-pay

## 2014-09-22 ENCOUNTER — Other Ambulatory Visit: Payer: Self-pay | Admitting: Internal Medicine

## 2014-09-22 MED ORDER — DOXAZOSIN MESYLATE 4 MG PO TABS: 4.0000 mg | ORAL_TABLET | Freq: Every day | ORAL | Status: DC

## 2014-10-16 ENCOUNTER — Encounter: Payer: Self-pay | Admitting: Internal Medicine

## 2014-10-16 ENCOUNTER — Other Ambulatory Visit (INDEPENDENT_AMBULATORY_CARE_PROVIDER_SITE_OTHER): Payer: Medicare Other

## 2014-10-16 ENCOUNTER — Ambulatory Visit (INDEPENDENT_AMBULATORY_CARE_PROVIDER_SITE_OTHER): Payer: Medicare Other | Admitting: Internal Medicine

## 2014-10-16 VITALS — BP 130/68 | HR 54 | Temp 97.7°F | Resp 14 | Ht 67.0 in | Wt 150.4 lb

## 2014-10-16 DIAGNOSIS — M791 Myalgia, unspecified site: Secondary | ICD-10-CM

## 2014-10-16 DIAGNOSIS — I10 Essential (primary) hypertension: Secondary | ICD-10-CM

## 2014-10-16 DIAGNOSIS — Z0000 Encounter for general adult medical examination without abnormal findings: Secondary | ICD-10-CM

## 2014-10-16 DIAGNOSIS — D649 Anemia, unspecified: Secondary | ICD-10-CM | POA: Diagnosis not present

## 2014-10-16 DIAGNOSIS — K5900 Constipation, unspecified: Secondary | ICD-10-CM

## 2014-10-16 LAB — COMPREHENSIVE METABOLIC PANEL
ALT: 10 U/L (ref 0–53)
AST: 18 U/L (ref 0–37)
Albumin: 3.9 g/dL (ref 3.5–5.2)
Alkaline Phosphatase: 26 U/L — ABNORMAL LOW (ref 39–117)
BUN: 14 mg/dL (ref 6–23)
CO2: 29 mEq/L (ref 19–32)
Calcium: 9.1 mg/dL (ref 8.4–10.5)
Chloride: 102 mEq/L (ref 96–112)
Creatinine, Ser: 1.17 mg/dL (ref 0.40–1.50)
GFR: 78.54 mL/min (ref >60.00)
Glucose, Bld: 102 mg/dL — ABNORMAL HIGH (ref 70–99)
Potassium: 3.8 mEq/L (ref 3.5–5.1)
Sodium: 139 mEq/L (ref 135–145)
Total Bilirubin: 0.7 mg/dL (ref 0.2–1.2)
Total Protein: 7.1 g/dL (ref 6.0–8.3)

## 2014-10-16 LAB — LIPID PANEL
Cholesterol: 160 mg/dL (ref 0–200)
HDL: 55.1 mg/dL (ref >39.00)
LDL Cholesterol: 95 mg/dL (ref 0–99)
NonHDL: 104.99
Total CHOL/HDL Ratio: 3
Triglycerides: 51 mg/dL (ref 0.0–149.0)
VLDL: 10.2 mg/dL (ref 0.0–40.0)

## 2014-10-16 LAB — CBC
HCT: 35.1 % — ABNORMAL LOW (ref 39.0–52.0)
Hemoglobin: 11.5 g/dL — ABNORMAL LOW (ref 13.0–17.0)
MCHC: 32.7 g/dL (ref 30.0–36.0)
MCV: 83.7 fl (ref 78.0–100.0)
Platelets: 146 10*3/uL — ABNORMAL LOW (ref 150.0–400.0)
RBC: 4.2 Mil/uL — ABNORMAL LOW (ref 4.22–5.81)
RDW: 14.1 % (ref 11.5–15.5)
WBC: 4.2 10*3/uL (ref 4.0–10.5)

## 2014-10-16 LAB — MAGNESIUM: Magnesium: 1.8 mg/dL (ref 1.5–2.5)

## 2014-10-16 LAB — CK: Total CK: 142 U/L (ref 7–232)

## 2014-10-16 NOTE — Assessment & Plan Note (Signed)
New problem, check CMP, CK and magnesium. Advised he can use tylenol or heating pads on the area.

## 2014-10-16 NOTE — Assessment & Plan Note (Signed)
Still having the same problem. Talked again to him about fiber and water which he seems to be doing. He is able to use milk of magnesia successfully but does not want to have to take something. Recommended to try ducosate (colace) daily for the problem to avoid having to use milk of magnesia. No blood or worrisome features.

## 2014-10-16 NOTE — Progress Notes (Signed)
   Subjective:    Patient ID: Evan Escobar, male    DOB: February 28, 1940, 74 y.o. y.o.   MRN: [id number]  HPI The patient is a 74 YO man coming in for follow up of his constipation. He has worked on increasing his fiber in his diet and is drinking 5-6 glasses of water per day. He still has intermittent constipation and is using milk of magnesia successfully for the constipation. He does not use it except a couple times per week.  He is also having a new problem which is muscle pain in his shoulder. It has been present for about 2 months, worse in the left. 2/10 pain. He has not really taken anything for it because he does not like to try anything for pain. Has not tried heat or ice. Denies any injury recently or old injury to either shoulder. No new activities that are extra strenuous. No fevers or chills. No weakness in the arms. No numbness or tingling.  Review of Systems  Constitutional: Negative for fever, chills, activity change, appetite change and fatigue.  Respiratory: Negative for cough, chest tightness, shortness of breath and wheezing.   Cardiovascular: Negative for chest pain, palpitations and leg swelling.  Gastrointestinal: Positive for constipation. Negative for abdominal pain, diarrhea, blood in stool and abdominal distention.  Musculoskeletal: Positive for myalgias and arthralgias. Negative for back pain and neck pain.  Skin: Negative.   Neurological: Negative.   Psychiatric/Behavioral: Negative.       Objective:   Physical Exam  Constitutional: He is oriented to person, place, and time. He appears well-developed and well-nourished. No distress.  HENT:  Head: Normocephalic and atraumatic.  Eyes: EOM are normal.  Neck: Normal range of motion. Neck supple.  Cardiovascular: Normal rate and regular rhythm.   Pulmonary/Chest: Effort normal and breath sounds normal.  Abdominal: Soft.  Musculoskeletal: He exhibits no edema.  Tenderness in the biceps region left greater than right. No  tenderness in the shoulder or back or forearm.   Lymphadenopathy:    He has no cervical adenopathy.  Neurological: He is alert and oriented to person, place, and time. No cranial nerve deficit. Coordination normal.   Filed Vitals:   10/16/14 0801  BP: 130/68  Pulse: 54  Temp: 97.7 F (36.5 C)  TempSrc: Oral  Resp: 14  Height: 5\' 7"  (1.702 m)  Weight: 150 lb 6.4 oz (68.221 kg)  SpO2: 97%      Assessment & Plan:

## 2014-10-16 NOTE — Assessment & Plan Note (Signed)
Checking CBC today. No blood in stools or other loss identified today. Not taking multivitamin currently.

## 2014-10-16 NOTE — Progress Notes (Signed)
Pre visit review using our clinic review tool, if applicable. No additional management support is needed unless otherwise documented below in the visit note. 

## 2014-10-16 NOTE — Patient Instructions (Signed)
We are going to check the blood tests today and call you back with the results. We are going to check a couple of muscle tests for the pain in your shoulder.   Good job with eating more fiber, sounds like you are doing well on the amount of water you are drinking.   If you still need help with the bowels a safe medicine you can take to help the stomach move better is called colace (docusate) and is over the counter. You can take it once a day to make it easier to go to the bathroom. It is safe for daily use.   Come back in about 6 months and if you are having any problems please call us sooner.

## 2014-10-16 NOTE — Assessment & Plan Note (Signed)
BP at goal on cardura and lisinopril and metoprolol xl. Checking CMP today and adjust as needed. No side effects from medications.

## 2014-10-21 ENCOUNTER — Other Ambulatory Visit: Payer: Self-pay | Admitting: Internal Medicine

## 2014-10-26 ENCOUNTER — Encounter (HOSPITAL_COMMUNITY): Payer: Self-pay | Admitting: Emergency Medicine

## 2014-10-26 ENCOUNTER — Emergency Department (INDEPENDENT_AMBULATORY_CARE_PROVIDER_SITE_OTHER)
Admission: EM | Admit: 2014-10-26 | Discharge: 2014-10-26 | Disposition: A | Discharge status: Home / Self Care | Payer: Medicare Other | Source: Home / Self Care | Attending: Family Medicine | Admitting: Family Medicine

## 2014-10-26 DIAGNOSIS — J302 Other seasonal allergic rhinitis: Secondary | ICD-10-CM | POA: Diagnosis not present

## 2014-10-26 MED ORDER — IPRATROPIUM BROMIDE 0.06 % NA SOLN: 2.0000 | Freq: Four times a day (QID) | NASAL | Status: DC

## 2014-10-26 NOTE — ED Provider Notes (Signed)
CSN: [id number]     Arrival date & time 10/26/14  1304 History   First MD Initiated Contact with Patient 10/26/14 1324     No chief complaint on file.  (Consider location/radiation/quality/duration/timing/severity/associated sxs/prior Treatment) Patient is a 74 y.o. male presenting with URI.  URI Presenting symptoms: congestion, cough, rhinorrhea and sore throat   Presenting symptoms: no ear pain, no facial pain, no fatigue and no fever   Severity:  Mild Onset quality:  Gradual Duration:  2 days Timing:  Intermittent Progression:  Waxing and waning Chronicity:  New Relieved by:  None tried Worsened by:  Certain positions Ineffective treatments:  None tried Associated symptoms: headaches and sneezing   Associated symptoms: no arthralgias, no myalgias, no neck pain, no sinus pain, no swollen glands and no wheezing   Risk factors: being elderly     Past Medical History  Diagnosis Date  . Hypertension   . Glaucoma   . Polyp, sigmoid colon 11/05/2011  . Seizure   . Normocytic anemia 09/25/2012  . Seizures 03/17/2013   Past Surgical History  Procedure Laterality Date  . Glaucoma repair  10 yrs ago  . Cataract extraction, bilateral     Family History  Problem Relation Age of Onset  . Colon cancer Neg Hx   . Stroke Mother   . Stroke Father   . High blood pressure Mother   . High blood pressure Father    Social History  Substance Use Topics  . Smoking status: Former Smoker -- 1.00 packs/day    Types: Cigarettes    Quit date: 02/18/2007  . Smokeless tobacco: Never Used     Comment: not sure but a while  . Alcohol Use: No     Comment: quit in 2009    Review of Systems  Constitutional: Negative for fever and fatigue.  HENT: Positive for congestion, rhinorrhea, sneezing and sore throat. Negative for ear pain.   Respiratory: Positive for cough. Negative for choking, shortness of breath and wheezing.   Cardiovascular:       Anterior chest pain associated with cough only. No  pain or discomfort today. Cough worse at night, better during the day Nonproductive   Gastrointestinal: Negative.   Musculoskeletal: Negative for myalgias, arthralgias and neck pain.  Neurological: Positive for headaches.  Psychiatric/Behavioral: Negative.   All other systems reviewed and are negative.   Allergies  Review of patient's allergies indicates no known allergies.  Home Medications   Prior to Admission medications   Medication Sig Start Date End Date Taking? Authorizing Provider  aspirin 81 MG tablet Take 81 mg by mouth daily.    Historical Provider, MD  cycloSPORINE (RESTASIS) 0.05 % ophthalmic emulsion Place 1 drop into both eyes 2 (two) times daily.    Historical Provider, MD  doxazosin (CARDURA) 4 MG tablet TAKE 1 TABLET(4 MG) BY MOUTH DAILY 09/22/14   Olga Millers, MD  ipratropium (ATROVENT) 0.06 % nasal spray Place 2 sprays into both nostrils 4 (four) times daily. 10/26/14   Janne Napoleon, NP  latanoprost (XALATAN) 0.005 % ophthalmic solution  09/20/12   Historical Provider, MD  levETIRAcetam (KEPPRA) 500 MG tablet Take 1 tablet (500 mg total) by mouth 2 (two) times daily. 06/06/14   [name], NP  lisinopril (PRINIVIL,ZESTRIL) 20 MG tablet TAKE 1 TABLET BY MOUTH EVERY DAY 07/27/14   Olga Millers, MD  metoprolol succinate (TOPROL-XL) 25 MG 24 hr tablet TAKE 1 TABLET BY MOUTH EVERY DAY 07/24/14   Olga Millers,  MD  metoprolol succinate (TOPROL-XL) 25 MG 24 hr tablet TAKE 1 TABLET BY MOUTH EVERY DAY 10/24/14   Olga Millers, MD  Multiple Vitamin (MULTIVITAMIN) tablet Take 1 tablet by mouth daily.    Historical Provider, MD   Meds Ordered and Administered this Visit  Medications - No data to display  BP 120/71 mmHg  Pulse 66  Temp(Src) 97.7 F (36.5 C) (Oral)  Resp 18  SpO2 98% No data found.   Physical Exam  Constitutional: He is oriented to person, place, and time. He appears well-developed and well-nourished. No distress.  HENT:   Mouth/Throat: No oropharyngeal exudate.  Bilateral TMs are normal Oropharynx with minor erythema much cobblestoning and clear PND.  Eyes: Conjunctivae and EOM are normal.  Neck: Normal range of motion. Neck supple.  Cardiovascular: Normal rate, regular rhythm and normal heart sounds.   Pulmonary/Chest: Effort normal and breath sounds normal. No respiratory distress. He has no wheezes. He has no rales. He exhibits no tenderness.  Musculoskeletal: Normal range of motion. He exhibits no edema.  Lymphadenopathy:    He has no cervical adenopathy.  Neurological: He is alert and oriented to person, place, and time.  Skin: Skin is warm and dry. No rash noted.  Psychiatric: He has a normal mood and affect.  Nursing note and vitals reviewed.   ED Course  Procedures (including critical care time)  Labs Review Labs Reviewed - No data to display  Imaging Review No results found.   Visual Acuity Review  Right Eye Distance:   Left Eye Distance:   Bilateral Distance:    Right Eye Near:   Left Eye Near:    Bilateral Near:         MDM   1. Other seasonal allergic rhinitis    Allergic Rhinitis Use Claritin or Allegra daily his longer having symptoms. Flonase nasal spray daily Use lots of saline nasal spray as needed for congestion Atrovent nasal spray for runny nose and sniffles Drink plenty of fluids stay well-hydrated Tylenol for any discomfort.    Janne Napoleon, NP 10/26/14 [phone]

## 2014-10-26 NOTE — Discharge Instructions (Signed)
Allergic Rhinitis Use Claritin or Allegra daily his longer having symptoms. Flonase nasal spray daily Use lots of saline nasal spray as needed for congestion Atrovent nasal spray for runny nose and sniffles Drink plenty of fluids stay well-hydrated Tylenol for any discomfort. Allergic rhinitis is when the mucous membranes in the nose respond to allergens. Allergens are particles in the air that cause your body to have an allergic reaction. This causes you to release allergic antibodies. Through a chain of events, these eventually cause you to release histamine into the blood stream. Although meant to protect the body, it is this release of histamine that causes your discomfort, such as frequent sneezing, congestion, and an itchy, runny nose.  CAUSES  Seasonal allergic rhinitis (hay fever) is caused by pollen allergens that may come from grasses, trees, and weeds. Year-round allergic rhinitis (perennial allergic rhinitis) is caused by allergens such as house dust mites, pet dander, and mold spores.  SYMPTOMS   Nasal stuffiness (congestion).  Itchy, runny nose with sneezing and tearing of the eyes. DIAGNOSIS  Your health care provider can help you determine the allergen or allergens that trigger your symptoms. If you and your health care provider are unable to determine the allergen, skin or blood testing may be used. TREATMENT  Allergic rhinitis does not have a cure, but it can be controlled by:  Medicines and allergy shots (immunotherapy).  Avoiding the allergen. Hay fever may often be treated with antihistamines in pill or nasal spray forms. Antihistamines block the effects of histamine. There are over-the-counter medicines that may help with nasal congestion and swelling around the eyes. Check with your health care provider before taking or giving this medicine.  If avoiding the allergen or the medicine prescribed do not work, there are many new medicines your health care provider can  prescribe. Stronger medicine may be used if initial measures are ineffective. Desensitizing injections can be used if medicine and avoidance does not work. Desensitization is when a patient is given ongoing shots until the body becomes less sensitive to the allergen. Make sure you follow up with your health care provider if problems continue. HOME CARE INSTRUCTIONS It is not possible to completely avoid allergens, but you can reduce your symptoms by taking steps to limit your exposure to them. It helps to know exactly what you are allergic to so that you can avoid your specific triggers. SEEK MEDICAL CARE IF:   You have a fever.  You develop a cough that does not stop easily (persistent).  You have shortness of breath.  You start wheezing.  Symptoms interfere with normal daily activities. Document Released: 10/29/2000 Document Revised: 02/08/2013 Document Reviewed: 10/11/2012 [facility] Patient Information 2015 [place], [place]. This information is not intended to replace advice given to you by your health care provider. Make sure you discuss any questions you have with your health care provider.

## 2014-10-26 NOTE — ED Notes (Signed)
Patient has cough cold, chest congestion, sinus stuffiness, runny nose.

## 2014-11-21 DIAGNOSIS — H401132 Primary open-angle glaucoma, bilateral, moderate stage: Secondary | ICD-10-CM | POA: Diagnosis not present

## 2014-11-21 DIAGNOSIS — H16223 Keratoconjunctivitis sicca, not specified as Sjogren's, bilateral: Secondary | ICD-10-CM | POA: Diagnosis not present

## 2014-11-21 DIAGNOSIS — H35033 Hypertensive retinopathy, bilateral: Secondary | ICD-10-CM | POA: Diagnosis not present

## 2014-11-23 ENCOUNTER — Other Ambulatory Visit: Payer: Self-pay | Admitting: Internal Medicine

## 2014-12-25 ENCOUNTER — Other Ambulatory Visit: Payer: Self-pay | Admitting: Internal Medicine

## 2015-02-07 ENCOUNTER — Encounter (HOSPITAL_COMMUNITY): Payer: Self-pay | Admitting: Emergency Medicine

## 2015-02-07 ENCOUNTER — Emergency Department (INDEPENDENT_AMBULATORY_CARE_PROVIDER_SITE_OTHER)
Admission: EM | Admit: 2015-02-07 | Discharge: 2015-02-07 | Disposition: A | Discharge status: Home / Self Care | Payer: Medicare Other | Source: Home / Self Care | Attending: Family Medicine | Admitting: Family Medicine

## 2015-02-07 DIAGNOSIS — M545 Low back pain, unspecified: Secondary | ICD-10-CM

## 2015-02-07 DIAGNOSIS — J069 Acute upper respiratory infection, unspecified: Secondary | ICD-10-CM | POA: Diagnosis not present

## 2015-02-07 DIAGNOSIS — J9801 Acute bronchospasm: Secondary | ICD-10-CM

## 2015-02-07 MED ORDER — PREDNISONE 20 MG PO TABS: ORAL_TABLET | ORAL | Status: DC

## 2015-02-07 MED ORDER — ALBUTEROL SULFATE HFA 108 (90 BASE) MCG/ACT IN AERS: 2.0000 | INHALATION_SPRAY | RESPIRATORY_TRACT | Status: DC | PRN

## 2015-02-07 NOTE — ED Provider Notes (Signed)
CSN: [id number]     Arrival date & time 02/07/15  1300 History   First MD Initiated Contact with Patient 02/07/15 1320     Chief Complaint  Patient presents with  . URI  . Back Pain   (Consider location/radiation/quality/duration/timing/severity/associated sxs/prior Treatment) HPI Comments: 74 year old male presents with 2 complaints. First complaint is that of head and chest congestion. He has a cough, runny nose, nasal stuffiness, right earache questionable sore throat sometimes, shortness of breath at night. Denies fever.  Second complaint is that of pain in the right mid to lower back that is worse with movement. This began approximately one month ago. No associated trauma, fall or known injury. The pain comes and goes, waxes and wanes. The pain will radiate across to the left side of the back and down the back of the legs. He states the leg muscles in the back of his legs will get sore. Denies paresthesias or focal weakness.  The history is provided by the patient. No language interpreter was used.    Past Medical History  Diagnosis Date  . Hypertension   . Glaucoma   . Polyp, sigmoid colon 11/05/2011  . Seizure ([place])   . Normocytic anemia 09/25/2012  . Seizures ([place]) 03/17/2013   Past Surgical History  Procedure Laterality Date  . Glaucoma repair  10 yrs ago  . Cataract extraction, bilateral     Family History  Problem Relation Age of Onset  . Colon cancer Neg Hx   . Stroke Mother   . Stroke Father   . High blood pressure Mother   . High blood pressure Father    Social History  Substance Use Topics  . Smoking status: Former Smoker -- 1.00 packs/day    Types: Cigarettes    Quit date: 02/18/2007  . Smokeless tobacco: Never Used     Comment: not sure but a while  . Alcohol Use: No     Comment: quit in 2009    Review of Systems  Constitutional: Positive for activity change. Negative for fever, diaphoresis and fatigue.  HENT: Positive for congestion, rhinorrhea and  sore throat. Negative for ear pain, facial swelling, postnasal drip and trouble swallowing.   Eyes: Negative for pain, discharge and redness.  Respiratory: Positive for cough. Negative for chest tightness and shortness of breath.   Cardiovascular: Negative.   Gastrointestinal: Negative.   Genitourinary: Negative.   Musculoskeletal: Positive for back pain. Negative for joint swelling, neck pain and neck stiffness.  Skin: Negative.   Neurological: Negative.   Psychiatric/Behavioral: Negative.   All other systems reviewed and are negative.   Allergies  Review of patient's allergies indicates no known allergies.  Home Medications   Prior to Admission medications   Medication Sig Start Date End Date Taking? Authorizing Provider  aspirin 81 MG tablet Take 81 mg by mouth daily.   Yes Historical Provider, MD  doxazosin (CARDURA) 4 MG tablet TAKE 1 TABLET BY MOUTH DAILY 12/25/14  Yes [name], MD  lisinopril (PRINIVIL,ZESTRIL) 20 MG tablet TAKE 1 TABLET BY MOUTH EVERY DAY 07/27/14  Yes [name], MD  metoprolol succinate (TOPROL-XL) 25 MG 24 hr tablet TAKE 1 TABLET BY MOUTH EVERY DAY 07/24/14  Yes [name], MD  albuterol (PROVENTIL HFA;VENTOLIN HFA) 108 (90 BASE) MCG/ACT inhaler Inhale 2 puffs into the lungs every 4 (four) hours as needed for wheezing or shortness of breath. 02/07/15   Janne Napoleon, NP  cycloSPORINE (RESTASIS) 0.05 % ophthalmic emulsion Place 1 drop  into both eyes 2 (two) times daily.    Historical Provider, MD  ipratropium (ATROVENT) 0.06 % nasal spray Place 2 sprays into both nostrils 4 (four) times daily. 10/26/14   Janne Napoleon, NP  latanoprost (XALATAN) 0.005 % ophthalmic solution  09/20/12   Historical Provider, MD  levETIRAcetam (KEPPRA) 500 MG tablet Take 1 tablet (500 mg total) by mouth 2 (two) times daily. 06/06/14   [name], NP  metoprolol succinate (TOPROL-XL) 25 MG 24 hr tablet TAKE 1 TABLET BY MOUTH EVERY DAY 11/23/14   [name], MD  Multiple Vitamin (MULTIVITAMIN) tablet Take 1 tablet by mouth daily.    Historical Provider, MD  predniSONE (DELTASONE) 20 MG tablet Take 3 tabs po on first day, 2 tabs second day, 2 tabs third day, 1 tab fourth day, 1 tab 5th day. Take with food. 02/07/15   Janne Napoleon, NP   Meds Ordered and Administered this Visit  Medications - No data to display  BP 167/65 mmHg  Pulse 78  Temp(Src) 98.4 F (36.9 C) (Oral)  Resp 16  SpO2 99% No data found.   Physical Exam  Constitutional: He is oriented to person, place, and time. He appears well-developed and well-nourished. No distress.  HENT:  Mouth/Throat: No oropharyngeal exudate.  Bilateral TMs are normal. Oropharynx with minor erythema and clear PND.  Eyes: Conjunctivae and EOM are normal.  Neck: Normal range of motion. Neck supple.  Cardiovascular: Normal rate, regular rhythm, normal heart sounds and intact distal pulses.   Pulmonary/Chest: Effort normal. No respiratory distress. He has no rales.  Respirations even and nonlabored. Relaxed posturing. Forced expiration and cough reveals diffuse bilateral wheezing, coarseness and prolonged expiratory phase.  Abdominal: Soft.  Musculoskeletal: He exhibits no edema.  Minor tenderness to the right para lumbar and thoracic musculature. Having the patient stand and leaned forward reproduces the pain. When the patient bends his knees and leans forward this also produces pain radiating across the low back and into the posterior thighs. Normal gait. Lower extremity strength is 4 over 5 and symmetric. No cervical, thoracic or lumbar spinal tenderness. No spinal swelling, deformity or discoloration.  Lymphadenopathy:    He has no cervical adenopathy.  Neurological: He is alert and oriented to person, place, and time.  Skin: Skin is warm and dry. No rash noted.  Psychiatric: He has a normal mood and affect.  Nursing note and vitals reviewed.   ED Course  Procedures (including  critical care time)  Labs Review Labs Reviewed - No data to display  Imaging Review No results found.   Visual Acuity Review  Right Eye Distance:   Left Eye Distance:   Bilateral Distance:    Right Eye Near:   Left Eye Near:    Bilateral Near:         MDM   1. URI (upper respiratory infection)   2. Cough due to bronchospasm   3. Right-sided low back pain without sciatica    For drainage use either Allegra or Zyrtec or Claritin for drainage. Use saline nasal spray frequently Drink clear fluids and stay well-hydrated Continue using your Atrovent nasal spray as needed Tylenol for pain. Do not use Aleve or ibuprofen while taking the prednisone. Heat to your back. Limit lifting, bending or pulling. Follow-up with your primary care doctor as soon as possible for for additional evaluation of back pain. For fever, shortness of breath, cough or other problems sick medical attention promptly.    Janne Napoleon, NP  02/07/15 1344 

## 2015-02-07 NOTE — ED Notes (Signed)
C/o cold sx onset 1 week associated w/congestion, runny nose, SOB, cough Denies fevers Also c/o lower back pain onset x1 month... Pain radiates down to bilateral legs Steady gait... A&O x4... No acute distress.

## 2015-02-07 NOTE — Discharge Instructions (Signed)
Bronchospasm, Adult A bronchospasm is when the tubes that carry air in and out of your lungs (airways) spasm or tighten. During a bronchospasm it is hard to breathe. This is because the airways get smaller. A bronchospasm can be triggered by:  Allergies. These may be to animals, pollen, food, or mold.  Infection. This is a common cause of bronchospasm.  Exercise.  Irritants. These include pollution, cigarette smoke, strong odors, aerosol sprays, and paint fumes.  Weather changes.  Stress.  Being emotional. HOME CARE   Always have a plan for getting help. Know when to call your doctor and local emergency services (911 in the [place] Know where you can get emergency care.  Only take medicines as told by your doctor.  If you were prescribed an inhaler or nebulizer machine, ask your doctor how to use it correctly. Always use a spacer with your inhaler if you were given one.  Stay calm during an attack. Try to relax and breathe more slowly.  Control your home environment:  Change your heating and air conditioning filter at least once a month.  Limit your use of fireplaces and wood stoves.  Do not  smoke. Do not  allow smoking in your home.  Avoid perfumes and fragrances.  Get rid of pests (such as roaches and mice) and their droppings.  Throw away plants if you see mold on them.  Keep your house clean and dust free.  Replace carpet with wood, tile, or vinyl flooring. Carpet can trap dander and dust.  Use allergy-proof pillows, mattress covers, and box spring covers.  Wash bed sheets and blankets every week in hot water. Dry them in a dryer.  Use blankets that are made of polyester or cotton.  Wash hands frequently. GET HELP IF:  You have muscle aches.  You have chest pain.  The thick spit you spit or cough up (sputum) changes from clear or white to yellow, green, gray, or bloody.  The thick spit you spit or cough up gets thicker.  There are problems that may be  related to the medicine you are given such as:  A rash.  Itching.  Swelling.  Trouble breathing. GET HELP RIGHT AWAY IF:  You feel you cannot breathe or catch your breath.  You cannot stop coughing.  Your treatment is not helping you breathe better.  You have very bad chest pain. MAKE SURE YOU:   Understand these instructions.  Will watch your condition.  Will get help right away if you are not doing well or get worse.   This information is not intended to replace advice given to you by your health care provider. Make sure you discuss any questions you have with your health care provider.   Document Released: 12/01/2008 Document Revised: 02/24/2014 Document Reviewed: 07/27/2012 Elsevier Interactive Patient Education 2016 [employer].  How to Use an Inhaler Using your inhaler correctly is very important. Good technique will make sure that the medicine reaches your lungs.  HOW TO USE AN INHALER:  Take the cap off the inhaler.  If this is the first time using your inhaler, you need to prime it. Shake the inhaler for 5 seconds. Release four puffs into the air, away from your face. Ask your doctor for help if you have questions.  Shake the inhaler for 5 seconds.  Turn the inhaler so the bottle is above the mouthpiece.  Put your pointer finger on top of the bottle. Your thumb holds the bottom of the inhaler.  Open your mouth.  Either hold the inhaler away from your mouth (the width of 2 fingers) or place your lips tightly around the mouthpiece. Ask your doctor which way to use your inhaler.  Breathe out as much air as possible.  Breathe in and push down on the bottle 1 time to release the medicine. You will feel the medicine go in your mouth and throat.  Continue to take a deep breath in very slowly. Try to fill your lungs.  After you have breathed in completely, hold your breath for 10 seconds. This will help the medicine to settle in your lungs. If you cannot hold  your breath for 10 seconds, hold it for as long as you can before you breathe out.  Breathe out slowly, through pursed lips. Whistling is an example of pursed lips.  If your doctor has told you to take more than 1 puff, wait at least 15-30 seconds between puffs. This will help you get the best results from your medicine. Do not use the inhaler more than your doctor tells you to.  Put the cap back on the inhaler.  Follow the directions from your doctor or from the inhaler package about cleaning the inhaler. If you use more than one inhaler, ask your doctor which inhalers to use and what order to use them in. Ask your doctor to help you figure out when you will need to refill your inhaler.  If you use a steroid inhaler, always rinse your mouth with water after your last puff, gargle and spit out the water. Do not swallow the water. GET HELP IF:  The inhaler medicine only partially helps to stop wheezing or shortness of breath.  You are having trouble using your inhaler.  You have some increase in thick spit (phlegm). GET HELP RIGHT AWAY IF:  The inhaler medicine does not help your wheezing or shortness of breath or you have tightness in your chest.  You have dizziness, headaches, or fast heart rate.  You have chills, fever, or night sweats.  You have a large increase of thick spit, or your thick spit is bloody. MAKE SURE YOU:   Understand these instructions.  Will watch your condition.  Will get help right away if you are not doing well or get worse.   This information is not intended to replace advice given to you by your health care provider. Make sure you discuss any questions you have with your health care provider.   Document Released: 11/13/2007 Document Revised: 11/24/2012 Document Reviewed: 09/02/2012 Elsevier Interactive Patient Education 2016 [employer].  Upper Respiratory Infection, Adult For drainage use either Allegra or Zyrtec or Claritin for drainage. Use  saline nasal spray frequently Drink clear fluids and stay well-hydrated Continue using your Atrovent nasal spray as needed Tylenol for pain. Do not use Aleve or ibuprofen while taking the prednisone. Most upper respiratory infections (URIs) are caused by a virus. A URI affects the nose, throat, and upper air passages. The most common type of URI is often called "the common cold." HOME CARE   Take medicines only as told by your doctor.  Gargle warm saltwater or take cough drops to comfort your throat as told by your doctor.  Use a warm mist humidifier or inhale steam from a shower to increase air moisture. This may make it easier to breathe.  Drink enough fluid to keep your pee (urine) clear or pale yellow.  Eat soups and other clear broths.  Have a healthy diet.  Rest as needed.  Go back to work when your fever is gone or your doctor says it is okay.  You may need to stay home longer to avoid giving your URI to others.  You can also wear a face mask and wash your hands often to prevent spread of the virus.  Use your inhaler more if you have asthma.  Do not use any tobacco products, including cigarettes, chewing tobacco, or electronic cigarettes. If you need help quitting, ask your doctor. GET HELP IF:  You are getting worse, not better.  Your symptoms are not helped by medicine.  You have chills.  You are getting more short of breath.  You have brown or red mucus.  You have yellow or brown discharge from your nose.  You have pain in your face, especially when you bend forward.  You have a fever.  You have puffy (swollen) neck glands.  You have pain while swallowing.  You have white areas in the back of your throat. GET HELP RIGHT AWAY IF:   You have very bad or constant:  Headache.  Ear pain.  Pain in your forehead, behind your eyes, and over your cheekbones (sinus pain).  Chest pain.  You have long-lasting (chronic) lung disease and any of the  following:  Wheezing.  Long-lasting cough.  Coughing up blood.  A change in your usual mucus.  You have a stiff neck.  You have changes in your:  Vision.  Hearing.  Thinking.  Mood. MAKE SURE YOU:   Understand these instructions.  Will watch your condition.  Will get help right away if you are not doing well or get worse.   This information is not intended to replace advice given to you by your health care provider. Make sure you discuss any questions you have with your health care provider.   Document Released: 07/23/2007 Document Revised: 06/20/2014 Document Reviewed: 05/11/2013 Elsevier Interactive Patient Education [employer].

## 2015-02-15 ENCOUNTER — Ambulatory Visit: Payer: Medicare Other | Admitting: Internal Medicine

## 2015-02-26 ENCOUNTER — Encounter: Payer: Self-pay | Admitting: Internal Medicine

## 2015-02-26 ENCOUNTER — Ambulatory Visit (INDEPENDENT_AMBULATORY_CARE_PROVIDER_SITE_OTHER): Payer: Medicare Other | Admitting: Internal Medicine

## 2015-02-26 VITALS — BP 128/70 | HR 56 | Temp 97.7°F | Resp 14 | Ht 67.0 in | Wt 158.0 lb

## 2015-02-26 DIAGNOSIS — J309 Allergic rhinitis, unspecified: Secondary | ICD-10-CM | POA: Insufficient documentation

## 2015-02-26 DIAGNOSIS — J3089 Other allergic rhinitis: Secondary | ICD-10-CM

## 2015-02-26 MED ORDER — FLUTICASONE PROPIONATE 50 MCG/ACT NA SUSP: 2.0000 | Freq: Every day | NASAL | Status: DC

## 2015-02-26 NOTE — Assessment & Plan Note (Signed)
Not taking anything for congestion at the time. Does not need steroids or antibiotics today. Breathing is normal on exam. Rx for flonase given at today's visit and encouraged to use zyrtec as well.

## 2015-02-26 NOTE — Progress Notes (Signed)
   Subjective:    Patient ID: Evan Escobar, male    DOB: [birth year], 75 y.o.   MRN: [id number]  HPI The patient is a 75 YO man coming in for cough and cold symptoms. Seen at an urgent care several weeks ago and they gave him steroids and albuterol inhaler. He is not feeling much better at this time. He is still having nose congestion and drainage. He denies sore throat, fevers or chills. He denies SOB or chest pain. He is having ear congestion and hearing loss.   Review of Systems  Constitutional: Negative for fever, chills, activity change, appetite change, fatigue and unexpected weight change.  HENT: Positive for congestion, ear discharge, ear pain, postnasal drip and rhinorrhea. Negative for dental problem, sinus pressure, sore throat and trouble swallowing.   Eyes: Negative.   Respiratory: Positive for cough. Negative for chest tightness, shortness of breath and wheezing.   Cardiovascular: Negative for chest pain, palpitations and leg swelling.  Gastrointestinal: Negative.   Musculoskeletal: Negative.       Objective:   Physical Exam  Constitutional: He is oriented to person, place, and time. He appears well-developed and well-nourished.  HENT:  Head: Normocephalic and atraumatic.  Right Ear: External ear normal.  Left ear obscured with wax, after disimpaction TM normal bilaterally, oropharynx with mild erythema and clear crusting of the nares.  Eyes: EOM are normal.  Neck: Normal range of motion.  Cardiovascular: Normal rate and regular rhythm.   Pulmonary/Chest: Effort normal and breath sounds normal. No respiratory distress. He has no wheezes. He has no rales.  Abdominal: Soft. He exhibits no distension. There is no tenderness.  Lymphadenopathy:    He has no cervical adenopathy.  Neurological: He is alert and oriented to person, place, and time.  Skin: Skin is warm and dry.   Filed Vitals:   02/26/15 1200  BP: 128/70  Pulse: 56  Temp: 97.7 F (36.5 C)  TempSrc: Oral    Resp: 14  Height: 5\' 7"  (1.702 m)  Weight: 158 lb (71.668 kg)  SpO2: 98%      Assessment & Plan:

## 2015-02-26 NOTE — Progress Notes (Signed)
Pre visit review using our clinic review tool, if applicable. No additional management support is needed unless otherwise documented below in the visit note. 

## 2015-02-26 NOTE — Patient Instructions (Addendum)
We have sent in a nose spray to help with the congestion. Use 2 sprays in each nostril daily for the next 1-2 weeks. This will help dry up the congestion.   Your lungs are clear and you do not need antibiotics today.   You can also use zyrtec over the counter for the next week to help with the congestion. As long as you have nose drainage you will have mild coughing. This helps your lungs to stay clear.   Upper Respiratory Infection, Adult Most upper respiratory infections (URIs) are a viral infection of the air passages leading to the lungs. A URI affects the nose, throat, and upper air passages. The most common type of URI is nasopharyngitis and is typically referred to as "the common cold." URIs run their course and usually go away on their own. Most of the time, a URI does not require medical attention, but sometimes a bacterial infection in the upper airways can follow a viral infection. This is called a secondary infection. Sinus and middle ear infections are common types of secondary upper respiratory infections. Bacterial pneumonia can also complicate a URI. A URI can worsen asthma and chronic obstructive pulmonary disease (COPD). Sometimes, these complications can require emergency medical care and may be life threatening.  CAUSES Almost all URIs are caused by viruses. A virus is a type of germ and can spread from one person to another.  RISKS FACTORS You may be at risk for a URI if:   You smoke.   You have chronic heart or lung disease.  You have a weakened defense (immune) system.   You are very young or very old.   You have nasal allergies or asthma.  You work in crowded or poorly ventilated areas.  You work in health care facilities or schools. SIGNS AND SYMPTOMS  Symptoms typically develop 2-3 days after you come in contact with a cold virus. Most viral URIs last 7-10 days. However, viral URIs from the influenza virus (flu virus) can last 14-18 days and are typically  more severe. Symptoms may include:   Runny or stuffy (congested) nose.   Sneezing.   Cough.   Sore throat.   Headache.   Fatigue.   Fever.   Loss of appetite.   Pain in your forehead, behind your eyes, and over your cheekbones (sinus pain).  Muscle aches.  DIAGNOSIS  Your health care provider may diagnose a URI by:  Physical exam.  Tests to check that your symptoms are not due to another condition such as:  Strep throat.  Sinusitis.  Pneumonia.  Asthma. TREATMENT  A URI goes away on its own with time. It cannot be cured with medicines, but medicines may be prescribed or recommended to relieve symptoms. Medicines may help:  Reduce your fever.  Reduce your cough.  Relieve nasal congestion. HOME CARE INSTRUCTIONS   Take medicines only as directed by your health care provider.   Gargle warm saltwater or take cough drops to comfort your throat as directed by your health care provider.  Use a warm mist humidifier or inhale steam from a shower to increase air moisture. This may make it easier to breathe.  Drink enough fluid to keep your urine clear or pale yellow.   Eat soups and other clear broths and maintain good nutrition.   Rest as needed.   Return to work when your temperature has returned to normal or as your health care provider advises. You may need to stay home longer to  avoid infecting others. You can also use a face mask and careful hand washing to prevent spread of the virus.  Increase the usage of your inhaler if you have asthma.   Do not use any tobacco products, including cigarettes, chewing tobacco, or electronic cigarettes. If you need help quitting, ask your health care provider. PREVENTION  The best way to protect yourself from getting a cold is to practice good hygiene.   Avoid oral or hand contact with people with cold symptoms.   Wash your hands often if contact occurs.  There is no clear evidence that vitamin C,  vitamin E, echinacea, or exercise reduces the chance of developing a cold. However, it is always recommended to get plenty of rest, exercise, and practice good nutrition.  SEEK MEDICAL CARE IF:   You are getting worse rather than better.   Your symptoms are not controlled by medicine.   You have chills.  You have worsening shortness of breath.  You have brown or red mucus.  You have yellow or brown nasal discharge.  You have pain in your face, especially when you bend forward.  You have a fever.  You have swollen neck glands.  You have pain while swallowing.  You have white areas in the back of your throat. SEEK IMMEDIATE MEDICAL CARE IF:   You have severe or persistent:  Headache.  Ear pain.  Sinus pain.  Chest pain.  You have chronic lung disease and any of the following:  Wheezing.  Prolonged cough.  Coughing up blood.  A change in your usual mucus.  You have a stiff neck.  You have changes in your:  Vision.  Hearing.  Thinking.  Mood. MAKE SURE YOU:   Understand these instructions.  Will watch your condition.  Will get help right away if you are not doing well or get worse.   This information is not intended to replace advice given to you by your health care provider. Make sure you discuss any questions you have with your health care provider.   Document Released: 07/30/2000 Document Revised: 06/20/2014 Document Reviewed: 05/11/2013 Elsevier Interactive Patient Education [employer].

## 2015-03-03 ENCOUNTER — Other Ambulatory Visit: Payer: Self-pay | Admitting: Internal Medicine

## 2015-03-27 ENCOUNTER — Other Ambulatory Visit: Payer: Self-pay | Admitting: Internal Medicine

## 2015-03-27 DIAGNOSIS — H348322 Tributary (branch) retinal vein occlusion, left eye, stable: Secondary | ICD-10-CM | POA: Diagnosis not present

## 2015-03-27 DIAGNOSIS — H35033 Hypertensive retinopathy, bilateral: Secondary | ICD-10-CM | POA: Diagnosis not present

## 2015-03-27 DIAGNOSIS — H401132 Primary open-angle glaucoma, bilateral, moderate stage: Secondary | ICD-10-CM | POA: Diagnosis not present

## 2015-03-27 DIAGNOSIS — H26492 Other secondary cataract, left eye: Secondary | ICD-10-CM | POA: Diagnosis not present

## 2015-04-13 ENCOUNTER — Emergency Department (HOSPITAL_COMMUNITY): Payer: No Typology Code available for payment source

## 2015-04-13 ENCOUNTER — Emergency Department (HOSPITAL_COMMUNITY)
Admission: EM | Admit: 2015-04-13 | Discharge: 2015-04-13 | Disposition: A | Discharge status: Home / Self Care | Payer: No Typology Code available for payment source | Attending: Emergency Medicine | Admitting: Emergency Medicine

## 2015-04-13 ENCOUNTER — Encounter (HOSPITAL_COMMUNITY): Payer: Self-pay | Admitting: Emergency Medicine

## 2015-04-13 DIAGNOSIS — Z79899 Other long term (current) drug therapy: Secondary | ICD-10-CM | POA: Insufficient documentation

## 2015-04-13 DIAGNOSIS — S0990XA Unspecified injury of head, initial encounter: Secondary | ICD-10-CM | POA: Insufficient documentation

## 2015-04-13 DIAGNOSIS — Z7951 Long term (current) use of inhaled steroids: Secondary | ICD-10-CM | POA: Diagnosis not present

## 2015-04-13 DIAGNOSIS — Z87891 Personal history of nicotine dependence: Secondary | ICD-10-CM | POA: Diagnosis not present

## 2015-04-13 DIAGNOSIS — H409 Unspecified glaucoma: Secondary | ICD-10-CM | POA: Diagnosis not present

## 2015-04-13 DIAGNOSIS — S29001A Unspecified injury of muscle and tendon of front wall of thorax, initial encounter: Secondary | ICD-10-CM | POA: Diagnosis not present

## 2015-04-13 DIAGNOSIS — M549 Dorsalgia, unspecified: Secondary | ICD-10-CM | POA: Diagnosis not present

## 2015-04-13 DIAGNOSIS — Y998 Other external cause status: Secondary | ICD-10-CM | POA: Insufficient documentation

## 2015-04-13 DIAGNOSIS — R0789 Other chest pain: Secondary | ICD-10-CM | POA: Diagnosis not present

## 2015-04-13 DIAGNOSIS — Z862 Personal history of diseases of the blood and blood-forming organs and certain disorders involving the immune mechanism: Secondary | ICD-10-CM | POA: Insufficient documentation

## 2015-04-13 DIAGNOSIS — S3992XA Unspecified injury of lower back, initial encounter: Secondary | ICD-10-CM | POA: Diagnosis not present

## 2015-04-13 DIAGNOSIS — I10 Essential (primary) hypertension: Secondary | ICD-10-CM | POA: Insufficient documentation

## 2015-04-13 DIAGNOSIS — Z7952 Long term (current) use of systemic steroids: Secondary | ICD-10-CM | POA: Diagnosis not present

## 2015-04-13 DIAGNOSIS — T148 Other injury of unspecified body region: Secondary | ICD-10-CM | POA: Diagnosis not present

## 2015-04-13 DIAGNOSIS — M542 Cervicalgia: Secondary | ICD-10-CM | POA: Diagnosis not present

## 2015-04-13 DIAGNOSIS — Y92410 Unspecified street and highway as the place of occurrence of the external cause: Secondary | ICD-10-CM | POA: Insufficient documentation

## 2015-04-13 DIAGNOSIS — Z86010 Personal history of colonic polyps: Secondary | ICD-10-CM | POA: Insufficient documentation

## 2015-04-13 DIAGNOSIS — S199XXA Unspecified injury of neck, initial encounter: Secondary | ICD-10-CM | POA: Diagnosis not present

## 2015-04-13 DIAGNOSIS — Y9389 Activity, other specified: Secondary | ICD-10-CM | POA: Diagnosis not present

## 2015-04-13 DIAGNOSIS — Z7982 Long term (current) use of aspirin: Secondary | ICD-10-CM | POA: Diagnosis not present

## 2015-04-13 DIAGNOSIS — M545 Low back pain: Secondary | ICD-10-CM | POA: Diagnosis not present

## 2015-04-13 MED ORDER — IBUPROFEN 600 MG PO TABS: 600.0000 mg | ORAL_TABLET | Freq: Four times a day (QID) | ORAL | Status: DC | PRN

## 2015-04-13 NOTE — ED Notes (Signed)
No seatbelt marks noted on patients chest
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
## 2015-04-13 NOTE — ED Notes (Signed)
Patient transported to X-ray 
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
## 2015-04-16 ENCOUNTER — Ambulatory Visit (INDEPENDENT_AMBULATORY_CARE_PROVIDER_SITE_OTHER): Payer: Medicare Other | Admitting: Internal Medicine

## 2015-04-16 ENCOUNTER — Encounter: Payer: Self-pay | Admitting: Internal Medicine

## 2015-04-16 VITALS — BP 140/90 | HR 59 | Temp 98.1°F | Resp 16 | Ht 67.0 in | Wt 159.6 lb

## 2015-04-16 DIAGNOSIS — I10 Essential (primary) hypertension: Secondary | ICD-10-CM

## 2015-04-16 DIAGNOSIS — Z23 Encounter for immunization: Secondary | ICD-10-CM

## 2015-04-16 DIAGNOSIS — M791 Myalgia, unspecified site: Secondary | ICD-10-CM
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
## 2015-04-16 NOTE — Assessment & Plan Note (Signed)
BP at goal on doxazosin and lisinopril and metoprolol. Labs from last visit without indication for change.

## 2015-04-16 NOTE — Patient Instructions (Signed)
You can schedule a wellness visit with Manuela Schwartz, our health coach then come back sometime this summer for a physical with me.   We do not need blood work today, we have given you the pneumonia booster shot today.
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
## 2015-04-16 NOTE — Progress Notes (Signed)
Pre visit review using our clinic review tool, if applicable. No additional management support is needed unless otherwise documented below in the visit note. 

## 2015-04-30 ENCOUNTER — Telehealth: Payer: Self-pay

## 2015-04-30 ENCOUNTER — Ambulatory Visit (INDEPENDENT_AMBULATORY_CARE_PROVIDER_SITE_OTHER): Payer: Medicare Other

## 2015-04-30 VITALS — BP 160/80 | Ht 67.0 in | Wt 160.2 lb

## 2015-04-30 DIAGNOSIS — Z0000 Encounter for general adult medical examination without abnormal findings: Secondary | ICD-10-CM
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
## 2015-04-30 NOTE — Telephone Encounter (Signed)
Dr. Sharlet Salina; this patient stated he was concerned about his left eye; Felt his vision is worse; Dr. Venetia Maxon noted some "darkening" and told the patient to come back in 4 months and may do laser surgery; States he can barely see when he gets up and then vision clears. Wonders why he doesn't go ahead with the laser.  Should I request vision note or I have recommended he call Dr. Venetia Maxon and get an apt for clarification;  Please advise?

## 2015-04-30 NOTE — Patient Instructions (Addendum)
Mr. Evan Escobar , Thank you for taking time to come for your Medicare Wellness Visit. I appreciate your ongoing commitment to your health goals. Please review the following plan we discussed and let me know if I can assist you in the future.  Diabetes and weight loss; [email or web address]  [employer]; [phone] [address] [place]; [phone]  [facility] [place]  No reviews  [facility]  [address]  [phone]  To call Lashe Oliveira at [facility] for issues getting hearing aid; [phone]  Nurse will check on the eye concern with Dr. Sharlet Salina;   [facility] offers free advance directive forms, as well as assistance in completing the forms themselves.  For assistance, contact the Spiritual Care Department at [phone], or the Clinical Social Work Department at [phone].   To check BP and record 2 to 3 times a week     These are the goals we discussed: Goals    . Exercise 150 minutes per week (moderate activity)     Keep walking 2 days; would like for you to walk 3 days. Or try the class with music     . maintain your health     Will continue walking at mall x 2 days a week x 60 minutes; Enjoys working on cars; Plays the guitar for relaxation       This is a list of the screening recommended for you and due dates:  Health Maintenance  Topic Date Due  . Shingles Vaccine  07/25/2001  . Flu Shot  09/18/2015  . Colon Cancer Screening  11/04/2016  . Tetanus Vaccine  08/16/2024  . Pneumonia vaccines  Completed     Fall Prevention in the Home  Falls can cause injuries. They can happen to people of all ages. There are many things you can do to make your home safe and to help prevent falls.  WHAT CAN I DO ON THE OUTSIDE OF MY HOME?  Regularly fix the edges of walkways and driveways and fix any cracks.  Remove anything that might make you trip as you walk  through a door, such as a raised step or threshold.  Trim any bushes or trees on the path to your home.  Use bright outdoor lighting.  Clear any walking paths of anything that might make someone trip, such as rocks or tools.  Regularly check to see if handrails are loose or broken. Make sure that both sides of any steps have handrails.  Any raised decks and porches should have guardrails on the edges.  Have any leaves, snow, or ice cleared regularly.  Use sand or salt on walking paths during winter.  Clean up any spills in your garage right away. This includes oil or grease spills. WHAT CAN I DO IN THE BATHROOM?   Use night lights.  Install grab bars by the toilet and in the tub and shower. Do not use towel bars as grab bars.  Use non-skid mats or decals in the tub or shower.  If you need to sit down in the shower, use a plastic, non-slip stool.  Keep the floor dry. Clean up any water that spills on the floor as soon as it happens.  Remove soap buildup in the tub or shower regularly.  Attach bath mats securely with double-sided non-slip rug tape.  Do not have throw rugs and other things on the floor that can make you trip. WHAT CAN I DO IN THE BEDROOM?  Use  night lights.  Make sure that you have a light by your bed that is easy to reach.  Do not use any sheets or blankets that are too big for your bed. They should not hang down onto the floor.  Have a firm chair that has side arms. You can use this for support while you get dressed.  Do not have throw rugs and other things on the floor that can make you trip. WHAT CAN I DO IN THE KITCHEN?  Clean up any spills right away.  Avoid walking on wet floors.  Keep items that you use a lot in easy-to-reach places.  If you need to reach something above you, use a strong step stool that has a grab bar.  Keep electrical cords out of the way.  Do not use floor polish or wax that makes floors slippery. If you must use wax,  use non-skid floor wax.  Do not have throw rugs and other things on the floor that can make you trip. WHAT CAN I DO WITH MY STAIRS?  Do not leave any items on the stairs.  Make sure that there are handrails on both sides of the stairs and use them. Fix handrails that are broken or loose. Make sure that handrails are as long as the stairways.  Check any carpeting to make sure that it is firmly attached to the stairs. Fix any carpet that is loose or worn.  Avoid having throw rugs at the top or bottom of the stairs. If you do have throw rugs, attach them to the floor with carpet tape.  Make sure that you have a light switch at the top of the stairs and the bottom of the stairs. If you do not have them, ask someone to add them for you. WHAT ELSE CAN I DO TO HELP PREVENT FALLS?  Wear shoes that:  Do not have high heels.  Have rubber bottoms.  Are comfortable and fit you well.  Are closed at the toe. Do not wear sandals.  If you use a stepladder:  Make sure that it is fully opened. Do not climb a closed stepladder.  Make sure that both sides of the stepladder are locked into place.  Ask someone to hold it for you, if possible.  Clearly mark and make sure that you can see:  Any grab bars or handrails.  First and last steps.  Where the edge of each step is.  Use tools that help you move around (mobility aids) if they are needed. These include:  Canes.  Walkers.  Scooters.  Crutches.  Turn on the lights when you go into a dark area. Replace any light bulbs as soon as they burn out.  Set up your furniture so you have a clear path. Avoid moving your furniture around.  If any of your floors are uneven, fix them.  If there are any pets around you, be aware of where they are.  Review your medicines with your doctor. Some medicines can make you feel dizzy. This can increase your chance of falling. Ask your doctor what other things that you can do to help prevent falls.    This information is not intended to replace advice given to you by your health care provider. Make sure you discuss any questions you have with your health care provider.   Document Released: 11/30/2008 Document Revised: 06/20/2014 Document Reviewed: 03/10/2014 Elsevier Interactive Patient Education 2016 [place] Maintenance, Male A healthy lifestyle and preventative care  can promote health and wellness.  Maintain regular health, dental, and eye exams.  Eat a healthy diet. Foods like vegetables, fruits, whole grains, low-fat dairy products, and lean protein foods contain the nutrients you need and are low in calories. Decrease your intake of foods high in solid fats, added sugars, and salt. Get information about a proper diet from your health care provider, if necessary.  Regular physical exercise is one of the most important things you can do for your health. Most adults should get at least 150 minutes of moderate-intensity exercise (any activity that increases your heart rate and causes you to sweat) each week. In addition, most adults need muscle-strengthening exercises on 2 or more days a week.   Maintain a healthy weight. The body mass index (BMI) is a screening tool to identify possible weight problems. It provides an estimate of body fat based on height and weight. Your health care provider can find your BMI and can help you achieve or maintain a healthy weight. For males 20 years and older:  A BMI below 18.5 is considered underweight.  A BMI of 18.5 to 24.9 is normal.  A BMI of 25 to 29.9 is considered overweight.  A BMI of 30 and above is considered obese.  Maintain normal blood lipids and cholesterol by exercising and minimizing your intake of saturated fat. Eat a balanced diet with plenty of fruits and vegetables. Blood tests for lipids and cholesterol should begin at age 54 and be repeated every 5 years. If your lipid or cholesterol levels are high, you are over  age 70, or you are at high risk for heart disease, you may need your cholesterol levels checked more frequently.Ongoing high lipid and cholesterol levels should be treated with medicines if diet and exercise are not working.  If you smoke, find out from your health care provider how to quit. If you do not use tobacco, do not start.  Lung cancer screening is recommended for adults aged 62-80 years who are at high risk for developing lung cancer because of a history of smoking. A yearly low-dose CT scan of the lungs is recommended for people who have at least a 30-pack-year history of smoking and are current smokers or have quit within the past 15 years. A pack year of smoking is smoking an average of 1 pack of cigarettes a day for 1 year (for example, a 30-pack-year history of smoking could mean smoking 1 pack a day for 30 years or 2 packs a day for 15 years). Yearly screening should continue until the smoker has stopped smoking for at least 15 years. Yearly screening should be stopped for people who develop a health problem that would prevent them from having lung cancer treatment.  If you choose to drink alcohol, do not have more than 2 drinks per day. One drink is considered to be 12 oz (360 mL) of beer, 5 oz (150 mL) of wine, or 1.5 oz (45 mL) of liquor.  Avoid the use of street drugs. Do not share needles with anyone. Ask for help if you need support or instructions about stopping the use of drugs.  High blood pressure causes heart disease and increases the risk of stroke. High blood pressure is more likely to develop in:  People who have blood pressure in the end of the normal range (100-139/85-89 mm Hg).  People who are overweight or obese.  People who are African American.  If you are 48-105 years of age, have your  blood pressure checked every 3-5 years. If you are 32 years of age or older, have your blood pressure checked every year. You should have your blood pressure measured twice--once  when you are at a hospital or clinic, and once when you are not at a hospital or clinic. Record the average of the two measurements. To check your blood pressure when you are not at a hospital or clinic, you can use:  An automated blood pressure machine at a pharmacy.  A home blood pressure monitor.  If you are 99-4 years old, ask your health care provider if you should take aspirin to prevent heart disease.  Diabetes screening involves taking a blood sample to check your fasting blood sugar level. This should be done once every 3 years after age 71 if you are at a normal weight and without risk factors for diabetes. Testing should be considered at a younger age or be carried out more frequently if you are overweight and have at least 1 risk factor for diabetes.  Colorectal cancer can be detected and often prevented. Most routine colorectal cancer screening begins at the age of 78 and continues through age 87. However, your health care provider may recommend screening at an earlier age if you have risk factors for colon cancer. On a yearly basis, your health care provider may provide home test kits to check for hidden blood in the stool. A small camera at the end of a tube may be used to directly examine the colon (sigmoidoscopy or colonoscopy) to detect the earliest forms of colorectal cancer. Talk to your health care provider about this at age 41 when routine screening begins. A direct exam of the colon should be repeated every 5-10 years through age 72, unless early forms of precancerous polyps or small growths are found.  People who are at an increased risk for hepatitis B should be screened for this virus. You are considered at high risk for hepatitis B if:  You were born in a country where hepatitis B occurs often. Talk with your health care provider about which countries are considered high risk.  Your parents were born in a high-risk country and you have not received a shot to protect  against hepatitis B (hepatitis B vaccine).  You have HIV or AIDS.  You use needles to inject street drugs.  You live with, or have sex with, someone who has hepatitis B.  You are a man who has sex with other men (MSM).  You get hemodialysis treatment.  You take certain medicines for conditions like cancer, organ transplantation, and autoimmune conditions.  Hepatitis C blood testing is recommended for all people born from [birth year] through 1964 and any individual with known risk factors for hepatitis C.  Healthy men should no longer receive prostate-specific antigen (PSA) blood tests as part of routine cancer screening. Talk to your health care provider about prostate cancer screening.  Testicular cancer screening is not recommended for adolescents or adult males who have no symptoms. Screening includes self-exam, a health care provider exam, and other screening tests. Consult with your health care provider about any symptoms you have or any concerns you have about testicular cancer.  Practice safe sex. Use condoms and avoid high-risk sexual practices to reduce the spread of sexually transmitted infections (STIs).  You should be screened for STIs, including gonorrhea and chlamydia if:  You are sexually active and are younger than 24 years.  You are older than 24 years, and your health care  provider tells you that you are at risk for this type of infection.  Your sexual activity has changed since you were last screened, and you are at an increased risk for chlamydia or gonorrhea. Ask your health care provider if you are at risk.  If you are at risk of being infected with HIV, it is recommended that you take a prescription medicine daily to prevent HIV infection. This is called pre-exposure prophylaxis (PrEP). You are considered at risk if:  You are a man who has sex with other men (MSM).  You are a heterosexual man who is sexually active with multiple partners.  You take drugs by  injection.  You are sexually active with a partner who has HIV.  Talk with your health care provider about whether you are at high risk of being infected with HIV. If you choose to begin PrEP, you should first be tested for HIV. You should then be tested every 3 months for as long as you are taking PrEP.  Use sunscreen. Apply sunscreen liberally and repeatedly throughout the day. You should seek shade when your shadow is shorter than you. Protect yourself by wearing long sleeves, pants, a wide-brimmed hat, and sunglasses year round whenever you are outdoors.  Tell your health care provider of new moles or changes in moles, especially if there is a change in shape or color. Also, tell your health care provider if a mole is larger than the size of a pencil eraser.  A one-time screening for abdominal aortic aneurysm (AAA) and surgical repair of large AAAs by ultrasound is recommended for men aged 74-75 years who are current or former smokers.  Stay current with your vaccines (immunizations).   This information is not intended to replace advice given to you by your health care provider. Make sure you discuss any questions you have with your health care provider.   Document Released: 08/02/2007 Document Revised: 02/24/2014 Document Reviewed: 07/01/2010 Elsevier Interactive Patient Education 2016 [employer].  Hearing Loss Hearing loss is a partial or total loss of the ability to hear. This can be temporary or permanent, and it can happen in one or both ears. Hearing loss may be referred to as deafness. Medical care is necessary to treat hearing loss properly and to prevent the condition from getting worse. Your hearing may partially or completely come back, depending on what caused your hearing loss and how severe it is. In some cases, hearing loss is permanent. CAUSES Common causes of hearing loss include:   Too much wax in the ear canal.   Infection of the ear canal or middle ear.    Fluid in the middle ear.   Injury to the ear or surrounding area.   An object stuck in the ear.   Prolonged exposure to loud sounds, such as music.  Less common causes of hearing loss include:   Tumors in the ear.   Viral or bacterial infections, such as meningitis.   A hole in the eardrum (perforated eardrum).  Problems with the hearing nerve that sends signals between the brain and the ear.  Certain medicines.  SYMPTOMS  Symptoms of this condition may include:  Difficulty telling the difference between sounds.  Difficulty following a conversation when there is background noise.  Lack of response to sounds in your environment. This may be most noticeable when you do not respond to startling sounds.  Needing to turn up the volume on the television, radio, etc.  Ringing in the ears.  Dizziness.  Pain in the ears. DIAGNOSIS This condition is diagnosed based on a physical exam and a hearing test (audiometry). The audiometry test will be performed by a hearing specialist (audiologist). You may also be referred to an ear, nose, and throat (ENT) specialist (otolaryngologist).  TREATMENT Treatment for recent onset of hearing loss may include:   Ear wax removal.   Being prescribed medicines to prevent infection (antibiotics).   Being prescribed medicines to reduce inflammation (corticosteroids).  HOME CARE INSTRUCTIONS  If you were prescribed an antibiotic medicine, take it as told by your health care provider. Do not stop taking the antibiotic even if you start to feel better.  Take over-the-counter and prescription medicines only as told by your health care provider.  Avoid loud noises.   Return to your normal activities as told by your health care provider. Ask your health care provider what activities are safe for you.  Keep all follow-up visits as told by your health care provider. This is important. SEEK MEDICAL CARE IF:   You feel dizzy.    You develop new symptoms.   You vomit or feel nauseous.   You have a fever.  SEEK IMMEDIATE MEDICAL CARE IF:  You develop sudden changes in your vision.   You have severe ear pain.   You have new or increased weakness.  You have a severe headache.   This information is not intended to replace advice given to you by your health care provider. Make sure you discuss any questions you have with your health care provider.   Document Released: 02/03/2005 Document Revised: 10/25/2014 Document Reviewed: 06/21/2014 Elsevier Interactive Patient Education [employer].

## 2015-05-01 NOTE — Progress Notes (Signed)
Medical screening examination/treatment/procedure(s) were performed by non-physician practitioner and as supervising physician I was immediately available for consultation/collaboration. I agree with above. Elizabeth A Crawford, MD 

## 2015-05-01 NOTE — Telephone Encounter (Signed)
If his vision is worsening he should call and get an appointment sooner with Dr. Venetia Maxon.

## 2015-05-03 ENCOUNTER — Telehealth: Payer: Self-pay

## 2015-05-03 NOTE — Telephone Encounter (Signed)
Call to Evan Escobar regarding his concern related to his vision. Dr. Sharlet Salina advised him to [facility] for [facility] to discuss and review his concerns. If he is not satisfied; please call back and let us know. The patient stated he would fup

## 2015-06-07 ENCOUNTER — Telehealth: Payer: Self-pay | Admitting: Internal Medicine

## 2015-06-07 MED ORDER — METOPROLOL SUCCINATE ER 25 MG PO TB24: 25.0000 mg | ORAL_TABLET | Freq: Every day | ORAL | Status: DC

## 2015-06-07 NOTE — Telephone Encounter (Signed)
Left msg on triage stating fax over request last week to have metoprolol refill. Pt is now out of medication. Requesting refill today.Pt also called triage inform him refill has been sent to [facility]...Evan Escobar

## 2015-06-12 ENCOUNTER — Ambulatory Visit (INDEPENDENT_AMBULATORY_CARE_PROVIDER_SITE_OTHER): Payer: Medicare Other | Admitting: Neurology

## 2015-06-12 ENCOUNTER — Encounter: Payer: Self-pay | Admitting: Neurology

## 2015-06-12 VITALS — BP 141/73 | HR 62 | Ht 67.0 in | Wt 154.5 lb

## 2015-06-12 DIAGNOSIS — R569 Unspecified convulsions: Secondary | ICD-10-CM

## 2015-06-12 MED ORDER — LEVETIRACETAM 500 MG PO TABS: 500.0000 mg | ORAL_TABLET | Freq: Two times a day (BID) | ORAL | Status: DC
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
## 2015-06-25 DIAGNOSIS — H348322 Tributary (branch) retinal vein occlusion, left eye, stable: Secondary | ICD-10-CM | POA: Diagnosis not present

## 2015-06-25 DIAGNOSIS — H26492 Other secondary cataract, left eye: Secondary | ICD-10-CM | POA: Diagnosis not present

## 2015-06-25 DIAGNOSIS — H401132 Primary open-angle glaucoma, bilateral, moderate stage: Secondary | ICD-10-CM | POA: Diagnosis not present

## 2015-06-25 DIAGNOSIS — H35033 Hypertensive retinopathy, bilateral: Secondary | ICD-10-CM | POA: Diagnosis not present

## 2015-06-26 ENCOUNTER — Other Ambulatory Visit: Payer: Self-pay | Admitting: Nurse Practitioner

## 2015-07-19 ENCOUNTER — Other Ambulatory Visit: Payer: Self-pay | Admitting: Internal Medicine

## 2015-08-28 ENCOUNTER — Encounter: Payer: Self-pay | Admitting: Internal Medicine

## 2015-08-28 ENCOUNTER — Ambulatory Visit (INDEPENDENT_AMBULATORY_CARE_PROVIDER_SITE_OTHER): Payer: Medicare Other | Admitting: Internal Medicine

## 2015-08-28 ENCOUNTER — Other Ambulatory Visit (INDEPENDENT_AMBULATORY_CARE_PROVIDER_SITE_OTHER): Payer: Medicare Other

## 2015-08-28 VITALS — BP 127/76 | HR 68 | Temp 98.2°F | Resp 12 | Ht 67.0 in | Wt 148.4 lb

## 2015-08-28 DIAGNOSIS — N138 Other obstructive and reflux uropathy: Secondary | ICD-10-CM | POA: Insufficient documentation

## 2015-08-28 DIAGNOSIS — Z0001 Encounter for general adult medical examination with abnormal findings: Secondary | ICD-10-CM | POA: Insufficient documentation

## 2015-08-28 DIAGNOSIS — N400 Enlarged prostate without lower urinary tract symptoms: Secondary | ICD-10-CM | POA: Diagnosis not present

## 2015-08-28 DIAGNOSIS — D649 Anemia, unspecified: Secondary | ICD-10-CM

## 2015-08-28 DIAGNOSIS — Z0000 Encounter for general adult medical examination without abnormal findings: Secondary | ICD-10-CM | POA: Diagnosis not present

## 2015-08-28 DIAGNOSIS — I10 Essential (primary) hypertension: Secondary | ICD-10-CM

## 2015-08-28 LAB — COMPREHENSIVE METABOLIC PANEL
ALT: 8 U/L (ref 0–53)
AST: 17 U/L (ref 0–37)
Albumin: 4.2 g/dL (ref 3.5–5.2)
Alkaline Phosphatase: 30 U/L — ABNORMAL LOW (ref 39–117)
BUN: 19 mg/dL (ref 6–23)
CO2: 28 mEq/L (ref 19–32)
Calcium: 9.6 mg/dL (ref 8.4–10.5)
Chloride: 103 mEq/L (ref 96–112)
Creatinine, Ser: 1.34 mg/dL (ref 0.40–1.50)
GFR: 67 mL/min (ref >60.00)
Glucose, Bld: 97 mg/dL (ref 70–99)
Potassium: 4 mEq/L (ref 3.5–5.1)
Sodium: 138 mEq/L (ref 135–145)
Total Bilirubin: 0.6 mg/dL (ref 0.2–1.2)
Total Protein: 7.4 g/dL (ref 6.0–8.3)

## 2015-08-28 LAB — CBC
HCT: 33.9 % — ABNORMAL LOW (ref 39.0–52.0)
Hemoglobin: 11.3 g/dL — ABNORMAL LOW (ref 13.0–17.0)
MCHC: 33.3 g/dL (ref 30.0–36.0)
MCV: 82.1 fl (ref 78.0–100.0)
Platelets: 157 10*3/uL (ref 150.0–400.0)
RBC: 4.13 Mil/uL — ABNORMAL LOW (ref 4.22–5.81)
RDW: 14.2 % (ref 11.5–15.5)
WBC: 4.2 10*3/uL (ref 4.0–10.5)

## 2015-08-28 LAB — LIPID PANEL
Cholesterol: 187 mg/dL (ref 0–200)
HDL: 54.4 mg/dL (ref >39.00)
LDL Cholesterol: 120 mg/dL — ABNORMAL HIGH (ref 0–99)
NonHDL: 132.96
Total CHOL/HDL Ratio: 3
Triglycerides: 63 mg/dL (ref 0.0–149.0)
VLDL: 12.6 mg/dL (ref 0.0–40.0)

## 2015-08-28 MED ORDER — TAMSULOSIN HCL 0.4 MG PO CAPS: 0.4000 mg | ORAL_CAPSULE | Freq: Every day | ORAL | Status: DC

## 2015-08-28 NOTE — Assessment & Plan Note (Signed)
Rx for flomax to help with night time symptoms for safety.

## 2015-08-28 NOTE — Assessment & Plan Note (Signed)
Checking CBC for stability. No bleeding or dark stools reported.

## 2015-08-28 NOTE — Patient Instructions (Signed)
We will check the blood work today and call you back with the results.   STOP taking metoprolol at night time.   We will send in flomax for the prostate to help with how often you are having to go to the bathroom. Take 1 pill at night time.   Health Maintenance, Male A healthy lifestyle and preventative care can promote health and wellness.  Maintain regular health, dental, and eye exams.  Eat a healthy diet. Foods like vegetables, fruits, whole grains, low-fat dairy products, and lean protein foods contain the nutrients you need and are low in calories. Decrease your intake of foods high in solid fats, added sugars, and salt. Get information about a proper diet from your health care provider, if necessary.  Regular physical exercise is one of the most important things you can do for your health. Most adults should get at least 150 minutes of moderate-intensity exercise (any activity that increases your heart rate and causes you to sweat) each week. In addition, most adults need muscle-strengthening exercises on 2 or more days a week.   Maintain a healthy weight. The body mass index (BMI) is a screening tool to identify possible weight problems. It provides an estimate of body fat based on height and weight. Your health care provider can find your BMI and can help you achieve or maintain a healthy weight. For males 20 years and older:  A BMI below 18.5 is considered underweight.  A BMI of 18.5 to 24.9 is normal.  A BMI of 25 to 29.9 is considered overweight.  A BMI of 30 and above is considered obese.  Maintain normal blood lipids and cholesterol by exercising and minimizing your intake of saturated fat. Eat a balanced diet with plenty of fruits and vegetables. Blood tests for lipids and cholesterol should begin at age 76 and be repeated every 5 years. If your lipid or cholesterol levels are high, you are over age 5, or you are at high risk for heart disease, you may need your cholesterol  levels checked more frequently.Ongoing high lipid and cholesterol levels should be treated with medicines if diet and exercise are not working.  If you smoke, find out from your health care provider how to quit. If you do not use tobacco, do not start.  Lung cancer screening is recommended for adults aged 97-80 years who are at high risk for developing lung cancer because of a history of smoking. A yearly low-dose CT scan of the lungs is recommended for people who have at least a 30-pack-year history of smoking and are current smokers or have quit within the past 15 years. A pack year of smoking is smoking an average of 1 pack of cigarettes a day for 1 year (for example, a 30-pack-year history of smoking could mean smoking 1 pack a day for 30 years or 2 packs a day for 15 years). Yearly screening should continue until the smoker has stopped smoking for at least 15 years. Yearly screening should be stopped for people who develop a health problem that would prevent them from having lung cancer treatment.  If you choose to drink alcohol, do not have more than 2 drinks per day. One drink is considered to be 12 oz (360 mL) of beer, 5 oz (150 mL) of wine, or 1.5 oz (45 mL) of liquor.  Avoid the use of street drugs. Do not share needles with anyone. Ask for help if you need support or instructions about stopping the use of  drugs.  High blood pressure causes heart disease and increases the risk of stroke. High blood pressure is more likely to develop in:  People who have blood pressure in the end of the normal range (100-139/85-89 mm Hg).  People who are overweight or obese.  People who are African American.  If you are 29-20 years of age, have your blood pressure checked every 3-5 years. If you are 80 years of age or older, have your blood pressure checked every year. You should have your blood pressure measured twice--once when you are at a hospital or clinic, and once when you are not at a hospital or  clinic. Record the average of the two measurements. To check your blood pressure when you are not at a hospital or clinic, you can use:  An automated blood pressure machine at a pharmacy.  A home blood pressure monitor.  If you are 29-4 years old, ask your health care provider if you should take aspirin to prevent heart disease.  Diabetes screening involves taking a blood sample to check your fasting blood sugar level. This should be done once every 3 years after age 44 if you are at a normal weight and without risk factors for diabetes. Testing should be considered at a younger age or be carried out more frequently if you are overweight and have at least 1 risk factor for diabetes.  Colorectal cancer can be detected and often prevented. Most routine colorectal cancer screening begins at the age of 60 and continues through age 87. However, your health care provider may recommend screening at an earlier age if you have risk factors for colon cancer. On a yearly basis, your health care provider may provide home test kits to check for hidden blood in the stool. A small camera at the end of a tube may be used to directly examine the colon (sigmoidoscopy or colonoscopy) to detect the earliest forms of colorectal cancer. Talk to your health care provider about this at age 4 when routine screening begins. A direct exam of the colon should be repeated every 5-10 years through age 21, unless early forms of precancerous polyps or small growths are found.  People who are at an increased risk for hepatitis B should be screened for this virus. You are considered at high risk for hepatitis B if:  You were born in a country where hepatitis B occurs often. Talk with your health care provider about which countries are considered high risk.  Your parents were born in a high-risk country and you have not received a shot to protect against hepatitis B (hepatitis B vaccine).  You have HIV or AIDS.  You use needles  to inject street drugs.  You live with, or have sex with, someone who has hepatitis B.  You are a man who has sex with other men (MSM).  You get hemodialysis treatment.  You take certain medicines for conditions like cancer, organ transplantation, and autoimmune conditions.  Hepatitis C blood testing is recommended for all people born from [birth year] through 1965 and any individual with known risk factors for hepatitis C.  Healthy men should no longer receive prostate-specific antigen (PSA) blood tests as part of routine cancer screening. Talk to your health care provider about prostate cancer screening.  Testicular cancer screening is not recommended for adolescents or adult males who have no symptoms. Screening includes self-exam, a health care provider exam, and other screening tests. Consult with your health care provider about any symptoms you have  or any concerns you have about testicular cancer.  Practice safe sex. Use condoms and avoid high-risk sexual practices to reduce the spread of sexually transmitted infections (STIs).  You should be screened for STIs, including gonorrhea and chlamydia if:  You are sexually active and are younger than 24 years.  You are older than 24 years, and your health care provider tells you that you are at risk for this type of infection.  Your sexual activity has changed since you were last screened, and you are at an increased risk for chlamydia or gonorrhea. Ask your health care provider if you are at risk.  If you are at risk of being infected with HIV, it is recommended that you take a prescription medicine daily to prevent HIV infection. This is called pre-exposure prophylaxis (PrEP). You are considered at risk if:  You are a man who has sex with other men (MSM).  You are a heterosexual man who is sexually active with multiple partners.  You take drugs by injection.  You are sexually active with a partner who has HIV.  Talk with your health  care provider about whether you are at high risk of being infected with HIV. If you choose to begin PrEP, you should first be tested for HIV. You should then be tested every 3 months for as long as you are taking PrEP.  Use sunscreen. Apply sunscreen liberally and repeatedly throughout the day. You should seek shade when your shadow is shorter than you. Protect yourself by wearing long sleeves, pants, a wide-brimmed hat, and sunglasses year round whenever you are outdoors.  Tell your health care provider of new moles or changes in moles, especially if there is a change in shape or color. Also, tell your health care provider if a mole is larger than the size of a pencil eraser.  A one-time screening for abdominal aortic aneurysm (AAA) and surgical repair of large AAAs by ultrasound is recommended for men aged 44-75 years who are current or former smokers.  Stay current with your vaccines (immunizations).   This information is not intended to replace advice given to you by your health care provider. Make sure you discuss any questions you have with your health care provider.   Document Released: 08/02/2007 Document Revised: 02/24/2014 Document Reviewed: 07/01/2010 Elsevier Interactive Patient Education [employer].

## 2015-08-28 NOTE — Progress Notes (Signed)
   Subjective:    Patient ID: Evan Escobar, male    DOB: February 22, 1940, 75 y.o.   MRN: [id number]  HPI The patient is a 75 YO man coming in for wellness. No new concerns today but his excessive urination is worsening and he is getting up 4-5 times per night now. This is dangerous for him as he has almost fallen trying to get up at night several times.   PMH, [place], social history reviewed and updated  Review of Systems  Constitutional: Negative for fever, chills, activity change, appetite change, fatigue and unexpected weight change.  HENT: Negative for sinus pressure, sore throat and trouble swallowing.   Eyes: Negative.   Respiratory: Negative for cough, chest tightness, shortness of breath and wheezing.   Cardiovascular: Negative for chest pain, palpitations and leg swelling.  Gastrointestinal: Negative.   Musculoskeletal: Positive for arthralgias. Negative for myalgias, back pain and gait problem.       Mild  Skin: Negative.   Neurological: Negative for dizziness, weakness, light-headedness and headaches.  Psychiatric/Behavioral: Negative.       Objective:   Physical Exam  Constitutional: He is oriented to person, place, and time. He appears well-developed and well-nourished.  HENT:  Head: Normocephalic and atraumatic.  Right Ear: External ear normal.  Left Ear: External ear normal.  Eyes: EOM are normal.  Neck: Normal range of motion.  Cardiovascular: Normal rate and regular rhythm.   Pulmonary/Chest: Effort normal and breath sounds normal. No respiratory distress. He has no wheezes. He has no rales.  Abdominal: Soft. Bowel sounds are normal. He exhibits no distension. There is no tenderness.  Musculoskeletal: He exhibits no edema.  Lymphadenopathy:    He has no cervical adenopathy.  Neurological: He is alert and oriented to person, place, and time.  Skin: Skin is warm and dry.  Psychiatric: He has a normal mood and affect.   Filed Vitals:   08/28/15 1000  BP: 127/76    Pulse: 68  Temp: 98.2 F (36.8 C)  TempSrc: Oral  Resp: 12  Height: 5\' 7"  (1.702 m)  Weight: 148 lb 6.4 oz (67.314 kg)  SpO2: 98%      Assessment & Plan:

## 2015-08-28 NOTE — Assessment & Plan Note (Signed)
BP is running low at home. Stop metoprolol and continue lisinopril. Checking CMP and adjust as needed.

## 2015-08-28 NOTE — Assessment & Plan Note (Signed)
Declines shingles shot today. Colonoscopy up to date. Getting hearing aids this week which will help QOL greatly. Reminded about the need to avoid distracted driving. Meds reviewed and adjusted as described for safety. Is exercising several times weekly. Given screening recommendations.

## 2015-08-28 NOTE — Progress Notes (Signed)
Pre visit review using our clinic review tool, if applicable. No additional management support is needed unless otherwise documented below in the visit note. 

## 2015-09-14 ENCOUNTER — Telehealth: Payer: Self-pay | Admitting: Internal Medicine

## 2015-09-14 NOTE — Telephone Encounter (Signed)
Patient Name: Evan Escobar  DOB: 10-12-1940    Initial Comment Caller states c/o blood pressure 97/53.   Nurse Assessment  Nurse: Mallie Mussel, RN, Alveta Heimlich Date/Time Eilene Ghazi Time): 09/14/2015 2:33:02 PM  Confirm and document reason for call. If symptomatic, describe symptoms. You must click the next button to save text entered. ---Caller states that his blood pressure was 97/53 about an hour ago. Current BP is 116/67. He has had some dizziness today, but not at present. He was taking Lisinopril and Metoprolol. The doctor discontinued the Metoprolol on the 11th. He is still getting lower readings.  Has the patient traveled out of the country within the last 30 days? ---No  Does the patient have any new or worsening symptoms? ---Yes  Will a triage be completed? ---Yes  Related visit to physician within the last 2 weeks? ---No  Does the PT have any chronic conditions? (i.e. diabetes, asthma, etc.) ---Yes  List chronic conditions. ---HTN, Glaucoma  Is this a behavioral health or substance abuse call? ---No     Guidelines    Guideline Title Affirmed Question Affirmed Notes  Low Blood Pressure [id number] Fall in systolic BP > 20 mm Hg from normal AND [2] NOT dizzy, lightheaded, or weak (all triage questions negative)    Final Disposition User   See PCP When Office is Open (within 3 days) Mallie Mussel, RN, Alveta Heimlich    Comments  Appointment scheduled for Monday, 09/17/15 at 1:45jpm with Dr. Pricilla Holm.   Referrals  REFERRED TO PCP OFFICE   Disagree/Comply: Comply

## 2015-09-17 ENCOUNTER — Encounter: Payer: Self-pay | Admitting: Internal Medicine

## 2015-09-17 ENCOUNTER — Ambulatory Visit (INDEPENDENT_AMBULATORY_CARE_PROVIDER_SITE_OTHER): Payer: Medicare Other | Admitting: Internal Medicine

## 2015-09-17 DIAGNOSIS — I10 Essential (primary) hypertension: Secondary | ICD-10-CM

## 2015-09-17 MED ORDER — LISINOPRIL 10 MG PO TABS: 10.0000 mg | ORAL_TABLET | Freq: Every day | ORAL | 3 refills | Status: DC

## 2015-09-17 NOTE — Assessment & Plan Note (Signed)
Will decrease lisinopril to 10 mg daily. He is also on flomax.

## 2015-09-17 NOTE — Patient Instructions (Signed)
We have sent in a prescription for the lisinopril 10 mg pills.   Currently you will keep taking the 20 mg lisinopril pills until they are gone.   Then pick up the 10 mg pills to switch to. These will keep your blood pressure from going down as much.   If you get another episode of the low blood pressure eat some salt and drink some water. Then pick up the 10 mg pills even if you have some of the 20 mg pills left.

## 2015-09-17 NOTE — Progress Notes (Signed)
   Subjective:    Patient ID: Evan Escobar, male    DOB: Feb 14, 1941, 75 y.o.   MRN: [id number]  HPI The patient is a 75 YO man coming in for follow up of his blood pressure. We stopped metoprolol and changed his lisinopril medicine. He has had an episode of low BP with 90/50 BP. He was feeling lightheaded and dizzy and blurry vision. Denies feeling sick that day or any difference in eating or drinking.   Review of Systems  Constitutional: Negative for activity change, appetite change, chills, fatigue, fever and unexpected weight change.  HENT: Negative for sinus pressure, sore throat and trouble swallowing.   Respiratory: Negative for cough, chest tightness, shortness of breath and wheezing.   Cardiovascular: Negative for chest pain, palpitations and leg swelling.  Gastrointestinal: Negative.   Musculoskeletal: Positive for arthralgias. Negative for back pain, gait problem and myalgias.       Mild  Skin: Negative.   Neurological: Positive for light-headedness. Negative for dizziness, weakness and headaches.  Psychiatric/Behavioral: Negative.       Objective:   Physical Exam  Constitutional: He is oriented to person, place, and time. He appears well-developed and well-nourished.  HENT:  Head: Normocephalic and atraumatic.  Right Ear: External ear normal.  Left Ear: External ear normal.  Eyes: EOM are normal.  Neck: Normal range of motion.  Cardiovascular: Normal rate and regular rhythm.   Pulmonary/Chest: Effort normal and breath sounds normal. No respiratory distress. He has no wheezes. He has no rales.  Abdominal: Soft. Bowel sounds are normal. He exhibits no distension. There is no tenderness.  Musculoskeletal: He exhibits no edema.  Neurological: He is alert and oriented to person, place, and time.  Skin: Skin is warm and dry.   Vitals:   09/17/15 1352  BP: 128/70  Pulse: 64  Temp: 97.6 F (36.4 C)  SpO2: 98%  Weight: 146 lb (66.2 kg)      Assessment & Plan:

## 2015-09-17 NOTE — Progress Notes (Signed)
Pre visit review using our clinic review tool, if applicable. No additional management support is needed unless otherwise documented below in the visit note. 

## 2015-09-28 ENCOUNTER — Other Ambulatory Visit: Payer: Self-pay | Admitting: Internal Medicine

## 2015-10-17 ENCOUNTER — Ambulatory Visit (HOSPITAL_COMMUNITY)
Admission: EM | Admit: 2015-10-17 | Discharge: 2015-10-17 | Disposition: A | Discharge status: Home / Self Care | Payer: Medicare Other | Attending: Family Medicine | Admitting: Family Medicine

## 2015-10-17 ENCOUNTER — Encounter (HOSPITAL_COMMUNITY): Payer: Self-pay | Admitting: Emergency Medicine

## 2015-10-17 DIAGNOSIS — J40 Bronchitis, not specified as acute or chronic: Secondary | ICD-10-CM

## 2015-10-17 MED ORDER — BENZONATATE 100 MG PO CAPS: 100.0000 mg | ORAL_CAPSULE | Freq: Three times a day (TID) | ORAL | 0 refills | Status: DC

## 2015-10-17 MED ORDER — AZITHROMYCIN 250 MG PO TABS: 250.0000 mg | ORAL_TABLET | Freq: Every day | ORAL | 0 refills | Status: DC

## 2015-10-17 NOTE — ED Triage Notes (Signed)
Cough, cold , runny, stuffy nose

## 2015-10-17 NOTE — ED Provider Notes (Signed)
CSN: [id number]     Arrival date & time 10/17/15  1053 History   None    Chief Complaint  Patient presents with  . URI   (Consider location/radiation/quality/duration/timing/severity/associated sxs/prior Treatment) Patient c/o cough and uri sx's for over 2 weeks.   The history is provided by the patient.  URI  Presenting symptoms: congestion and cough   Severity:  Moderate Onset quality:  Sudden Duration:  2 weeks Timing:  Constant Progression:  Unchanged Chronicity:  New Relieved by:  Nothing Worsened by:  Nothing Ineffective treatments:  None tried   Past Medical History:  Diagnosis Date  . Glaucoma   . Hypertension   . Normocytic anemia 09/25/2012  . Polyp, sigmoid colon 11/05/2011  . Seizure ([place])   . Seizures ([place]) 03/17/2013   Past Surgical History:  Procedure Laterality Date  . CATARACT EXTRACTION, BILATERAL    . GLAUCOMA REPAIR  10 yrs ago   Family History  Problem Relation Age of Onset  . Stroke Mother   . High blood pressure Mother   . Stroke Father   . High blood pressure Father   . Colon cancer Neg Hx    Social History  Substance Use Topics  . Smoking status: Former Smoker    Packs/day: 1.00    Types: Cigarettes    Quit date: 02/18/2007  . Smokeless tobacco: Never Used     Comment: not sure but a while ago; quit smoking and ETOH  . Alcohol use No     Comment: quit in 2009    Review of Systems  Constitutional: Negative.   HENT: Positive for congestion.   Eyes: Negative.   Respiratory: Positive for cough.   Cardiovascular: Negative.   Gastrointestinal: Negative.   Endocrine: Negative.   Genitourinary: Negative.   Musculoskeletal: Negative.   Skin: Negative.   Allergic/Immunologic: Negative.   Neurological: Negative.   Hematological: Negative.   Psychiatric/Behavioral: Negative.     Allergies  Review of patient's allergies indicates no known allergies.  Home Medications   Prior to Admission medications   Medication Sig Start Date  End Date Taking? Authorizing Provider  aspirin 81 MG tablet Take 81 mg by mouth daily.    Historical Provider, MD  azithromycin (ZITHROMAX) 250 MG tablet Take 1 tablet (250 mg total) by mouth daily. Take first 2 tablets together, then 1 every day until finished. 10/17/15   Lysbeth Penner, FNP  benzonatate (TESSALON) 100 MG capsule Take 1 capsule (100 mg total) by mouth every 8 (eight) hours. 10/17/15   Lysbeth Penner, FNP  calcium-vitamin D (OSCAL WITH D) 500-200 MG-UNIT tablet Take 1 tablet by mouth.    Historical Provider, MD  cycloSPORINE (RESTASIS) 0.05 % ophthalmic emulsion Place 1 drop into both eyes 2 (two) times daily.    Historical Provider, MD  doxazosin (CARDURA) 4 MG tablet TAKE 1 TABLET BY MOUTH DAILY 09/28/15   [name], MD  latanoprost (XALATAN) 0.005 % ophthalmic solution  09/20/12   Historical Provider, MD  levETIRAcetam (KEPPRA) 500 MG tablet Take 1 tablet (500 mg total) by mouth 2 (two) times daily. 06/12/15   Marcial Pacas, MD  lisinopril (PRINIVIL,ZESTRIL) 10 MG tablet Take 1 tablet (10 mg total) by mouth daily. 09/17/15   [name], MD  Multiple Vitamin (MULTIVITAMIN) tablet Take 1 tablet by mouth daily.    Historical Provider, MD  tamsulosin (FLOMAX) 0.4 MG CAPS capsule Take 1 capsule (0.4 mg total) by mouth daily. 08/28/15   [name], MD  Meds Ordered and Administered this Visit  Medications - No data to display  BP 146/76   Pulse 82   Temp 98.5 F (36.9 C) (Oral)   Resp 20   SpO2 100%  No data found.   Physical Exam  Constitutional: He appears well-developed and well-nourished.  HENT:  Head: Normocephalic and atraumatic.  Right Ear: External ear normal.  Left Ear: External ear normal.  Nose: Nose normal.  OPX injected Uvula edematous  Eyes: Conjunctivae and EOM are normal. Pupils are equal, round, and reactive to light.  Neck: Normal range of motion. Neck supple.  Cardiovascular: Normal rate and regular rhythm.    Pulmonary/Chest: Effort normal and breath sounds normal.  Abdominal: Soft. Bowel sounds are normal.  Nursing note and vitals reviewed.   Urgent Care Course   Clinical Course    Procedures (including critical care time)  Labs Review Labs Reviewed - No data to display  Imaging Review No results found.   Visual Acuity Review  Right Eye Distance:   Left Eye Distance:   Bilateral Distance:    Right Eye Near:   Left Eye Near:    Bilateral Near:         MDM   1. Bronchitis    Zpak as directed Tessalon perles 100mg  one po tid prn #21    Lysbeth Penner, FNP 10/17/15 1220

## 2015-10-29 DIAGNOSIS — H35033 Hypertensive retinopathy, bilateral: Secondary | ICD-10-CM | POA: Diagnosis not present

## 2015-10-29 DIAGNOSIS — H348322 Tributary (branch) retinal vein occlusion, left eye, stable: Secondary | ICD-10-CM | POA: Diagnosis not present

## 2015-10-29 DIAGNOSIS — H401132 Primary open-angle glaucoma, bilateral, moderate stage: Secondary | ICD-10-CM | POA: Diagnosis not present

## 2015-10-29 DIAGNOSIS — H26492 Other secondary cataract, left eye: Secondary | ICD-10-CM | POA: Diagnosis not present

## 2015-12-28 ENCOUNTER — Other Ambulatory Visit: Payer: Self-pay | Admitting: Internal Medicine

## 2016-01-24 ENCOUNTER — Ambulatory Visit (INDEPENDENT_AMBULATORY_CARE_PROVIDER_SITE_OTHER): Payer: Medicare Other | Admitting: Podiatry

## 2016-01-24 ENCOUNTER — Encounter: Payer: Self-pay | Admitting: Podiatry

## 2016-01-24 VITALS — BP 163/77 | HR 54 | Resp 16

## 2016-01-24 DIAGNOSIS — M79676 Pain in unspecified toe(s): Secondary | ICD-10-CM | POA: Diagnosis not present

## 2016-01-24 DIAGNOSIS — L600 Ingrowing nail: Secondary | ICD-10-CM

## 2016-01-24 DIAGNOSIS — B351 Tinea unguium: Secondary | ICD-10-CM

## 2016-01-24 NOTE — Patient Instructions (Signed)

## 2016-01-27 NOTE — Progress Notes (Signed)
Subjective:     Patient ID: Evan Escobar, male   DOB: 02-Apr-1940, 75 y.o.   MRN: [id number]  HPI patient presents stating I have had a very painful ingrown toenail my right big toe that I cannot get out myself and my nails are thick and I cannot cut and they become bothersome   Review of Systems     Objective:   Physical Exam Neurovascular status intact muscle strength adequate with patient noted to have incurvated lateral border right hallux that's painful when pressed with distal redness but no active drainage and is noted to have thick nailbeds 1-5 both feet that are brittle and moderately painful when pressed    Assessment:     Ingrown toenail deformity right hallux medial border and mycotic nail infection with pain 1-5 both feet    Plan:     H&P and conditions reviewed. At this point I recommended removal of the nail border and I explained procedure and risk and patient is willing to accept risk of surgery. I infiltrated the right hallux 60 mg Xylocaine Marcaine mixture remove the border exposed matrix and applied phenol 3 applications 30 seconds followed by alcohol lavage and sterile dressing. I then gave instructions on soaks and I also debrided nailbeds 1-5 both feet with no iatrogenic bleeding noted

## 2016-02-13 ENCOUNTER — Encounter (HOSPITAL_COMMUNITY): Payer: Self-pay | Admitting: Emergency Medicine

## 2016-02-13 ENCOUNTER — Ambulatory Visit (HOSPITAL_COMMUNITY)
Admission: EM | Admit: 2016-02-13 | Discharge: 2016-02-13 | Disposition: A | Discharge status: Home / Self Care | Payer: Medicare Other | Attending: Nurse Practitioner | Admitting: Nurse Practitioner

## 2016-02-13 DIAGNOSIS — J0100 Acute maxillary sinusitis, unspecified: Secondary | ICD-10-CM | POA: Diagnosis not present

## 2016-02-13 DIAGNOSIS — H6501 Acute serous otitis media, right ear: Secondary | ICD-10-CM

## 2016-02-13 MED ORDER — AMOXICILLIN-POT CLAVULANATE 875-125 MG PO TABS: 1.0000 | ORAL_TABLET | Freq: Two times a day (BID) | ORAL | 0 refills | Status: AC

## 2016-02-13 MED ORDER — FLUTICASONE PROPIONATE 50 MCG/ACT NA SUSP: 2.0000 | Freq: Every day | NASAL | 1 refills | Status: DC

## 2016-02-13 NOTE — ED Triage Notes (Signed)
Pt reports productive cough, cold symptoms for 1 week. PT also reports right ear pain.

## 2016-02-13 NOTE — ED Provider Notes (Signed)
CSN: [id number]     Arrival date & time 02/13/16  1040 History   None    Chief Complaint  Patient presents with  . URI   (Consider location/radiation/quality/duration/timing/severity/associated sxs/prior Treatment) Patient is a well-appearing 75 year old male, presents today for right ear pain and cold symptoms for over a week. Patient reports his symptoms are not improving. Patient is concern for possible ear infection. Besides the ear pain, other symptoms are nasal congestion, runny nose, sinus pain/pressure, sneezing, sore throat, and coughing. Patient denies fever at home. He also denies hearing loss or tinnitus.       Past Medical History:  Diagnosis Date  . Glaucoma   . Hypertension   . Normocytic anemia 09/25/2012  . Polyp, sigmoid colon 11/05/2011  . Seizure ([place])   . Seizures ([place]) 03/17/2013   Past Surgical History:  Procedure Laterality Date  . CATARACT EXTRACTION, BILATERAL    . GLAUCOMA REPAIR  10 yrs ago   Family History  Problem Relation Age of Onset  . Stroke Mother   . High blood pressure Mother   . Stroke Father   . High blood pressure Father   . Colon cancer Neg Hx    Social History  Substance Use Topics  . Smoking status: Former Smoker    Packs/day: 1.00    Types: Cigarettes    Quit date: 02/18/2007  . Smokeless tobacco: Never Used     Comment: not sure but a while ago; quit smoking and ETOH  . Alcohol use No     Comment: quit in 2009    Review of Systems  Constitutional: Negative for chills and fever.  HENT: Positive for congestion, ear pain, rhinorrhea, sinus pain, sinus pressure, sneezing and sore throat.   Respiratory: Positive for cough. Negative for shortness of breath and wheezing.   Cardiovascular: Positive for chest pain. Negative for palpitations.       Chest pain during coughing  Gastrointestinal: Negative for abdominal pain, nausea and vomiting.  Neurological: Positive for headaches. Negative for dizziness.    Allergies  Patient  has no known allergies.  Home Medications   Prior to Admission medications   Medication Sig Start Date End Date Taking? Authorizing Provider  amoxicillin-clavulanate (AUGMENTIN) 875-125 MG tablet Take 1 tablet by mouth 2 (two) times daily. 02/13/16 02/20/16  Barry Dienes, NP  aspirin 81 MG tablet Take 81 mg by mouth daily.    Historical Provider, MD  azithromycin (ZITHROMAX) 250 MG tablet Take 1 tablet (250 mg total) by mouth daily. Take first 2 tablets together, then 1 every day until finished. 10/17/15   Lysbeth Penner, FNP  benzonatate (TESSALON) 100 MG capsule Take 1 capsule (100 mg total) by mouth every 8 (eight) hours. 10/17/15   Lysbeth Penner, FNP  calcium-vitamin D (OSCAL WITH D) 500-200 MG-UNIT tablet Take 1 tablet by mouth.    Historical Provider, MD  cycloSPORINE (RESTASIS) 0.05 % ophthalmic emulsion Place 1 drop into both eyes 2 (two) times daily.    Historical Provider, MD  doxazosin (CARDURA) 4 MG tablet TAKE 1 TABLET BY MOUTH DAILY 12/28/15   [name], MD  fluticasone [facility]) 50 MCG/ACT nasal spray Place 2 sprays into both nostrils daily. 02/13/16 02/20/16  Barry Dienes, NP  latanoprost (XALATAN) 0.005 % ophthalmic solution  09/20/12   Historical Provider, MD  levETIRAcetam (KEPPRA) 500 MG tablet Take 1 tablet (500 mg total) by mouth 2 (two) times daily. 06/12/15   Marcial Pacas, MD  lisinopril (PRINIVIL,ZESTRIL) 10 MG tablet  Take 1 tablet (10 mg total) by mouth daily. 09/17/15   [name], MD  Multiple Vitamin (MULTIVITAMIN) tablet Take 1 tablet by mouth daily.    Historical Provider, MD  tamsulosin (FLOMAX) 0.4 MG CAPS capsule Take 1 capsule (0.4 mg total) by mouth daily. 08/28/15   [name], MD   Meds Ordered and Administered this Visit  Medications - No data to display  BP 144/66   Pulse 74   Temp 98.6 F (37 C) (Oral)   Resp 16   Ht 5\' 7"  (1.702 m)   Wt 155 lb (70.3 kg)   SpO2 100%   BMI 24.28 kg/m  No data found.   Physical Exam   Constitutional: He is oriented to person, place, and time. He appears well-developed and well-nourished.  HENT:  Head: Normocephalic and atraumatic.  Right Ear: External ear normal.  Left Ear: External ear normal.  Nose: Nose normal.  Mouth/Throat: Oropharynx is clear and moist. No oropharyngeal exudate.  Left Ear: Tympanic membrane pearly gray with no erythema.  Right Ear: Noted to have bubbles in the middle ear, TM also noted to be red.   Maxillary sinuses tender to percuss.   Eyes: Conjunctivae and EOM are normal. Pupils are equal, round, and reactive to light. Right eye exhibits no discharge. Left eye exhibits no discharge.  Neck: Normal range of motion. Neck supple.  Cardiovascular: Normal rate, regular rhythm and normal heart sounds.   Pulmonary/Chest: Effort normal and breath sounds normal. No respiratory distress. He has no wheezes.  Abdominal: Soft. Bowel sounds are normal. He exhibits no distension. There is no tenderness.  Lymphadenopathy:    He has no cervical adenopathy.  Neurological: He is alert and oriented to person, place, and time.  Skin: Skin is warm and dry.  Nursing note and vitals reviewed.   Urgent Care Course   Clinical Course     Procedures (including critical care time)  Labs Review Labs Reviewed - No data to display  Imaging Review No results found.   MDM   1. Right acute serous otitis media, recurrence not specified   2. Acute non-recurrent maxillary sinusitis    Patient is a well-appearing 75 year old male, presents today for right ear pain and cold symptoms for over a week. On examination, patient noted to have OME of the right ear. The right TM is also noted to be erythematous. Will treat with Augmentin today for otitis media. Patient also may have an underlying sinusitis given that his URI symptoms has not improved for over a week; Augmentin will also help with this. Patient informed to use Flonase 2 sprays each nostril for the next 7 days.    Informed to f/u with PCP if he does not improve.  Discharge instructions given. All questions answered.    Barry Dienes, NP 02/13/16 1216

## 2016-02-22 ENCOUNTER — Ambulatory Visit (INDEPENDENT_AMBULATORY_CARE_PROVIDER_SITE_OTHER): Payer: Medicare Other | Admitting: Internal Medicine

## 2016-02-22 DIAGNOSIS — H6690 Otitis media, unspecified, unspecified ear: Secondary | ICD-10-CM | POA: Diagnosis not present

## 2016-02-22 MED ORDER — PREDNISONE 20 MG PO TABS: 40.0000 mg | ORAL_TABLET | Freq: Every day | ORAL | 0 refills | Status: DC

## 2016-02-22 MED ORDER — HYDROCODONE-HOMATROPINE 5-1.5 MG/5ML PO SYRP: 5.0000 mL | ORAL_SOLUTION | Freq: Three times a day (TID) | ORAL | 0 refills | Status: DC | PRN

## 2016-02-22 NOTE — Progress Notes (Signed)
   Subjective:    Patient ID: Evan Escobar, male    DOB: 1941-01-14, 76 y.o.   MRN: [id number]  HPI The patient is a 76 YO man coming in for ear pain and cold symptoms. Started about 1-2 weeks ago. Went to urgent care about 1 week ago and they gave him augmentin and flonase for ear infection. He is not feeling a lot better so he came back. He did start feeling better while taking the antibiotics. He stopped within 1-2 days of the antibiotic. Cough is still prominent and keeping him up at night time.   Review of Systems  Constitutional: Positive for activity change and fatigue. Negative for appetite change, chills, fever and unexpected weight change.  HENT: Positive for congestion, ear pain, rhinorrhea and sore throat. Negative for ear discharge, postnasal drip, sinus pain, sinus pressure and trouble swallowing.   Eyes: Negative.   Respiratory: Positive for cough. Negative for chest tightness, shortness of breath and wheezing.   Cardiovascular: Negative.   Gastrointestinal: Negative.   Musculoskeletal: Negative.       Objective:   Physical Exam  Constitutional: He is oriented to person, place, and time. He appears well-developed and well-nourished.  HENT:  Head: Normocephalic and atraumatic.  TMs bilateral normal, oropharynx with redness and clear drainage.   Eyes: EOM are normal.  Neck: Normal range of motion.  Cardiovascular: Normal rate and regular rhythm.   Pulmonary/Chest: Effort normal. No respiratory distress. He has no wheezes.  Mild rhonchi in the lungs which partially clears with cough.   Abdominal: Soft. He exhibits no distension. There is no tenderness. There is no rebound.  Neurological: He is alert and oriented to person, place, and time.  Skin: Skin is warm and dry.   Vitals:   02/22/16 1445  BP: (!) 144/80  Pulse: (!) 56  Resp: 12  Temp: 97.8 F (36.6 C)  TempSrc: Oral  SpO2: 100%  Weight: 155 lb 4 oz (70.4 kg)  Height: 5\' 7"  (1.702 m)      Assessment &  Plan:

## 2016-02-22 NOTE — Patient Instructions (Signed)
We have sent in prednisone to help with the congestion. Take 2 pills a day for 5 days.   We have given you the cough syrup medicine that you can use. Do not drive after taking as it can make you sleepy or drowsy.

## 2016-02-22 NOTE — Assessment & Plan Note (Signed)
Right ear and finished course of augmentin. Ear appears improved today. Still with significant sinus congestion as well as some rhonchi in the lungs. Rx for prednisone today and hycodan for cough.

## 2016-02-22 NOTE — Progress Notes (Signed)
Pre visit review using our clinic review tool, if applicable. No additional management support is needed unless otherwise documented below in the visit note. 

## 2016-03-10 ENCOUNTER — Encounter: Payer: Self-pay | Admitting: Internal Medicine

## 2016-03-10 ENCOUNTER — Other Ambulatory Visit (INDEPENDENT_AMBULATORY_CARE_PROVIDER_SITE_OTHER): Payer: Medicare Other

## 2016-03-10 ENCOUNTER — Ambulatory Visit (INDEPENDENT_AMBULATORY_CARE_PROVIDER_SITE_OTHER): Payer: Medicare Other | Admitting: Internal Medicine

## 2016-03-10 VITALS — BP 150/76 | HR 62 | Temp 97.8°F | Resp 12 | Ht 67.0 in | Wt 155.5 lb

## 2016-03-10 DIAGNOSIS — I10 Essential (primary) hypertension: Secondary | ICD-10-CM

## 2016-03-10 LAB — COMPREHENSIVE METABOLIC PANEL
ALT: 11 U/L (ref 0–53)
AST: 17 U/L (ref 0–37)
Albumin: 3.9 g/dL (ref 3.5–5.2)
Alkaline Phosphatase: 31 U/L — ABNORMAL LOW (ref 39–117)
BUN: 16 mg/dL (ref 6–23)
CO2: 30 mEq/L (ref 19–32)
Calcium: 9.5 mg/dL (ref 8.4–10.5)
Chloride: 102 mEq/L (ref 96–112)
Creatinine, Ser: 1.24 mg/dL (ref 0.40–1.50)
GFR: 73.16 mL/min (ref >60.00)
Glucose, Bld: 93 mg/dL (ref 70–99)
Potassium: 4.4 mEq/L (ref 3.5–5.1)
Sodium: 138 mEq/L (ref 135–145)
Total Bilirubin: 0.5 mg/dL (ref 0.2–1.2)
Total Protein: 7.2 g/dL (ref 6.0–8.3)

## 2016-03-10 NOTE — Patient Instructions (Signed)
We are checking the labs today and will call you back with the results.    Exercising to Stay Healthy Introduction Exercising regularly is important. It has many health benefits, such as:  Improving your overall fitness, flexibility, and endurance.  Increasing your bone density.  Helping with weight control.  Decreasing your body fat.  Increasing your muscle strength.  Reducing stress and tension.  Improving your overall health. In order to become healthy and stay healthy, it is recommended that you do moderate-intensity and vigorous-intensity exercise. You can tell that you are exercising at a moderate intensity if you have a higher heart rate and faster breathing, but you are still able to hold a conversation. You can tell that you are exercising at a vigorous intensity if you are breathing much harder and faster and cannot hold a conversation while exercising. How often should I exercise? Choose an activity that you enjoy and set realistic goals. Your health care provider can help you to make an activity plan that works for you. Exercise regularly as directed by your health care provider. This may include:  Doing resistance training twice each week, such as:  Push-ups.  Sit-ups.  Lifting weights.  Using resistance bands.  Doing a given intensity of exercise for a given amount of time. Choose from these options:  150 minutes of moderate-intensity exercise every week.  75 minutes of vigorous-intensity exercise every week.  A mix of moderate-intensity and vigorous-intensity exercise every week. Children, pregnant women, people who are out of shape, people who are overweight, and older adults may need to consult a health care provider for individual recommendations. If you have any sort of medical condition, be sure to consult your health care provider before starting a new exercise program. What are some exercise ideas? Some moderate-intensity exercise ideas  include:  Walking at a rate of 1 mile in 15 minutes.  Biking.  Hiking.  Golfing.  Dancing. Some vigorous-intensity exercise ideas include:  Walking at a rate of at least 4.5 miles per hour.  Jogging or running at a rate of 5 miles per hour.  Biking at a rate of at least 10 miles per hour.  Lap swimming.  Roller-skating or in-line skating.  Cross-country skiing.  Vigorous competitive sports, such as football, basketball, and soccer.  Jumping rope.  Aerobic dancing. What are some everyday activities that can help me to get exercise?  Yard work, such as:  Psychologist, educational.  Raking and bagging leaves.  Washing and waxing your car.  Pushing a stroller.  Shoveling snow.  Gardening.  Washing windows or floors. How can I be more active in my day-to-day activities?  Use the stairs instead of the elevator.  Take a walk during your lunch break.  If you drive, park your car farther away from work or school.  If you take public transportation, get off one stop early and walk the rest of the way.  Make all of your phone calls while standing up and walking around.  Get up, stretch, and walk around every 30 minutes throughout the day. What guidelines should I follow while exercising?  Do not exercise so much that you hurt yourself, feel dizzy, or get very short of breath.  Consult your health care provider before starting a new exercise program.  Wear comfortable clothes and shoes with good support.  Drink plenty of water while you exercise to prevent dehydration or heat stroke. Body water is lost during exercise and must be replaced.  Work  out until you breathe faster and your heart beats faster. This information is not intended to replace advice given to you by your health care provider. Make sure you discuss any questions you have with your health care provider. Document Released: 03/08/2010 Document Revised: 07/12/2015 Document Reviewed: 07/07/2013   2017 [employer]

## 2016-03-10 NOTE — Progress Notes (Signed)
Pre visit review using our clinic review tool, if applicable. No additional management support is needed unless otherwise documented below in the visit note. 

## 2016-03-10 NOTE — Assessment & Plan Note (Addendum)
Is complicated by some mild CKD. Checking CMP today for any progression. BP recheck close to goal with most readings in the last 6 months at goal and no adjustment to meds today. Continue cardura 4 mg daily and lisinopril 10 mg daily.

## 2016-03-10 NOTE — Progress Notes (Signed)
   Subjective:    Patient ID: Evan Escobar, male    DOB: 02/20/1940, 76 y.o.   MRN: [id number]  HPI The patient is a 76 YO man coming in for follow up of his blood pressure. He is not sure if he is still taking the cardura, lisinopril. He denies being out of meds of side effects from the medicines. Review of his fill records shows that he filled lisinopril and cardura appropriately so he may just be confused which he is taking. He denies headaches, chest pains, SOB. His cold is now gone and ear is feeling normal. No new concerns or problems.   Review of Systems  Constitutional: Negative for activity change, appetite change, chills, fatigue, fever and unexpected weight change.  Respiratory: Negative.   Cardiovascular: Negative.   Gastrointestinal: Negative.   Musculoskeletal: Negative.   Skin: Negative.   Neurological: Negative.       Objective:   Physical Exam  Constitutional: He is oriented to person, place, and time. He appears well-developed and well-nourished.  HENT:  Head: Normocephalic and atraumatic.  Hard of hearing  Eyes: EOM are normal.  Neck: Normal range of motion.  Cardiovascular: Normal rate and regular rhythm.   Pulmonary/Chest: Effort normal and breath sounds normal.  Abdominal: Soft. He exhibits no distension. There is no tenderness.  Neurological: He is alert and oriented to person, place, and time.  Skin: Skin is warm and dry.   Vitals:   03/10/16 0928  BP: (!) 150/78  Pulse: 62  Resp: 12  Temp: 97.8 F (36.6 C)  TempSrc: Oral  SpO2: 100%  Weight: 155 lb 8 oz (70.5 kg)  Height: 5\' 7"  (1.702 m)      Assessment & Plan:

## 2016-03-26 DIAGNOSIS — H348322 Tributary (branch) retinal vein occlusion, left eye, stable: Secondary | ICD-10-CM | POA: Diagnosis not present

## 2016-03-26 DIAGNOSIS — H401133 Primary open-angle glaucoma, bilateral, severe stage: Secondary | ICD-10-CM | POA: Diagnosis not present

## 2016-03-30 ENCOUNTER — Other Ambulatory Visit: Payer: Self-pay | Admitting: Internal Medicine

## 2016-05-05 ENCOUNTER — Encounter (HOSPITAL_COMMUNITY): Payer: Self-pay | Admitting: Family Medicine

## 2016-05-05 ENCOUNTER — Ambulatory Visit (HOSPITAL_COMMUNITY)
Admission: EM | Admit: 2016-05-05 | Discharge: 2016-05-05 | Disposition: A | Discharge status: Home / Self Care | Payer: Medicare Other | Attending: Family Medicine | Admitting: Family Medicine

## 2016-05-05 DIAGNOSIS — J0100 Acute maxillary sinusitis, unspecified: Secondary | ICD-10-CM

## 2016-05-05 DIAGNOSIS — S161XXA Strain of muscle, fascia and tendon at neck level, initial encounter: Secondary | ICD-10-CM

## 2016-05-05 MED ORDER — HYDROCODONE-ACETAMINOPHEN 5-325 MG PO TABS: 1.0000 | ORAL_TABLET | Freq: Four times a day (QID) | ORAL | 0 refills | Status: DC | PRN

## 2016-05-05 MED ORDER — PREDNISONE 20 MG PO TABS: ORAL_TABLET | ORAL | 0 refills | Status: DC

## 2016-05-05 MED ORDER — AMOXICILLIN 875 MG PO TABS: 875.0000 mg | ORAL_TABLET | Freq: Two times a day (BID) | ORAL | 0 refills | Status: DC

## 2016-05-05 NOTE — Discharge Instructions (Signed)
The medicine will probably take 2 days to work. If you're getting worse, either return or go to your doctor.

## 2016-05-05 NOTE — ED Provider Notes (Signed)
[place]    CSN: [id number] Arrival date & time: 05/05/16  1043     History   Chief Complaint Chief Complaint  Patient presents with  . Neck Pain    HPI Evan Escobar is a 76 y.o. male.   Pt here for neck pain. sts that started Saturday after putting in a radiator. sts he possibly pulled a muscle. sts right lateral neck pain. Pain did not begin until Sunday morning and has gotten progressively more stiff with pain along the right lateral posterior neck  Patient has also had 2 weeks of sinus congestion and slight decrease in hearing.      Past Medical History:  Diagnosis Date  . Glaucoma   . Hypertension   . Normocytic anemia 09/25/2012  . Polyp, sigmoid colon 11/05/2011  . Seizure ([place])   . Seizures ([place]) 03/17/2013    Patient Active Problem List   Diagnosis Date Noted  . Ear infection 02/22/2016  . BPH (benign prostatic hypertrophy) 08/28/2015  . Routine general medical examination at a health care facility 08/28/2015  . Allergic rhinitis 02/26/2015  . Myalgia 10/16/2014  . Constipation 04/26/2014  . Pain in joint, ankle and foot 02/14/2014  . Glaucoma 05/07/2013  . Essential hypertension, benign 05/07/2013  . Seizures ([place]) 03/17/2013  . Normocytic anemia 09/25/2012    Past Surgical History:  Procedure Laterality Date  . CATARACT EXTRACTION, BILATERAL    . GLAUCOMA REPAIR  10 yrs ago       Home Medications    Prior to Admission medications   Medication Sig Start Date End Date Taking? Authorizing Provider  amoxicillin (AMOXIL) 875 MG tablet Take 1 tablet (875 mg total) by mouth 2 (two) times daily. 05/05/16   Robyn Haber, MD  aspirin 81 MG tablet Take 81 mg by mouth daily.    Historical Provider, MD  calcium-vitamin D (OSCAL WITH D) 500-200 MG-UNIT tablet Take 1 tablet by mouth.    Historical Provider, MD  cycloSPORINE (RESTASIS) 0.05 % ophthalmic emulsion Place 1 drop into both eyes 2 (two) times daily.    Historical Provider, MD    doxazosin (CARDURA) 4 MG tablet TAKE 1 TABLET BY MOUTH DAILY 03/31/16   [name], MD  HYDROcodone-acetaminophen (NORCO) 5-325 MG tablet Take 1 tablet by mouth every 6 (six) hours as needed for moderate pain. 05/05/16   Robyn Haber, MD  latanoprost (XALATAN) 0.005 % ophthalmic solution  09/20/12   Historical Provider, MD  levETIRAcetam (KEPPRA) 500 MG tablet Take 1 tablet (500 mg total) by mouth 2 (two) times daily. 06/12/15   Marcial Pacas, MD  lisinopril (PRINIVIL,ZESTRIL) 10 MG tablet Take 1 tablet (10 mg total) by mouth daily. 09/17/15   [name], MD  Multiple Vitamin (MULTIVITAMIN) tablet Take 1 tablet by mouth daily.    Historical Provider, MD  predniSONE (DELTASONE) 20 MG tablet Two daily with food 05/05/16   Robyn Haber, MD    Family History Family History  Problem Relation Age of Onset  . Stroke Mother   . High blood pressure Mother   . Stroke Father   . High blood pressure Father   . Colon cancer Neg Hx     Social History Social History  Substance Use Topics  . Smoking status: Former Smoker    Packs/day: 1.00    Types: Cigarettes    Quit date: 02/18/2007  . Smokeless tobacco: Never Used     Comment: not sure but a while ago; quit smoking and ETOH  .  Alcohol use No     Comment: quit in 2009     Allergies   Patient has no known allergies.   Review of Systems Review of Systems  Constitutional: Negative.   HENT: Positive for congestion, hearing loss and sinus pressure.   Respiratory: Negative.   Cardiovascular: Negative.   Musculoskeletal: Positive for neck pain.  Neurological: Negative.      Physical Exam Triage Vital Signs ED Triage Vitals [05/05/16 1123]  Enc Vitals Group     BP 139/60     Pulse Rate 68     Resp 16     Temp 98.1 F (36.7 C)     Temp src      SpO2 100 %     Weight      Height      Head Circumference      Peak Flow      Pain Score 7     Pain Loc      Pain Edu?      Excl. in [place]?    No data  found.   Updated Vital Signs BP 139/60   Pulse 68   Temp 98.1 F (36.7 C)   Resp 16   SpO2 100%    Physical Exam  Constitutional: He is oriented to person, place, and time. He appears well-developed and well-nourished.  HENT:  Right Ear: External ear normal.  Left Ear: External ear normal.  Mouth/Throat: Oropharynx is clear and moist.  Normal TMs Mucopurulent discharge both nasal passages  Eyes: Conjunctivae and EOM are normal. Pupils are equal, round, and reactive to light.  Neck: Neck supple. No thyromegaly present.  Patient cannot rotate his neck either way. Much. He can nod  Pulmonary/Chest: Effort normal.  Musculoskeletal: Normal range of motion.  Lymphadenopathy:    He has no cervical adenopathy.  Neurological: He is alert and oriented to person, place, and time.  Skin: Skin is warm and dry.  Nursing note and vitals reviewed.    UC Treatments / Results  Labs (all labs ordered are listed, but only abnormal results are displayed) Labs Reviewed - No data to display  EKG  EKG Interpretation None       Radiology No results found.  Procedures Procedures (including critical care time)  Medications Ordered in UC Medications - No data to display   Initial Impression / Assessment and Plan / UC Course  I have reviewed the triage vital signs and the nursing notes.  Pertinent labs & imaging results that were available during my care of the patient were reviewed by me and considered in my medical decision making (see chart for details).     Final Clinical Impressions(s) / UC Diagnoses   Final diagnoses:  Acute strain of neck muscle, initial encounter  Acute maxillary sinusitis, recurrence not specified    New Prescriptions New Prescriptions   AMOXICILLIN (AMOXIL) 875 MG TABLET    Take 1 tablet (875 mg total) by mouth 2 (two) times daily.   HYDROCODONE-ACETAMINOPHEN (NORCO) 5-325 MG TABLET    Take 1 tablet by mouth every 6 (six) hours as needed for  moderate pain.   PREDNISONE (DELTASONE) 20 MG TABLET    Two daily with food     Robyn Haber, MD 05/05/16 1148

## 2016-05-05 NOTE — ED Triage Notes (Addendum)
Pt here for neck pain. sts that started Saturday after putting in a radiator. sts he possibly pulled a muscle. sts right lateral neck pain.

## 2016-06-11 ENCOUNTER — Ambulatory Visit: Payer: Medicare Other | Admitting: Nurse Practitioner

## 2016-06-12 ENCOUNTER — Encounter: Payer: Self-pay | Admitting: Nurse Practitioner

## 2016-06-23 ENCOUNTER — Ambulatory Visit (INDEPENDENT_AMBULATORY_CARE_PROVIDER_SITE_OTHER): Payer: Medicare Other | Admitting: Internal Medicine

## 2016-06-23 ENCOUNTER — Ambulatory Visit (INDEPENDENT_AMBULATORY_CARE_PROVIDER_SITE_OTHER)
Admission: RE | Admit: 2016-06-23 | Discharge: 2016-06-23 | Disposition: A | Discharge status: Home / Self Care | Payer: Medicare Other | Source: Ambulatory Visit | Attending: Internal Medicine | Admitting: Internal Medicine

## 2016-06-23 ENCOUNTER — Encounter: Payer: Self-pay | Admitting: Internal Medicine

## 2016-06-23 VITALS — BP 132/72 | HR 60 | Temp 98.6°F | Resp 12 | Ht 67.0 in | Wt 152.0 lb

## 2016-06-23 DIAGNOSIS — K5900 Constipation, unspecified: Secondary | ICD-10-CM | POA: Diagnosis not present

## 2016-06-23 DIAGNOSIS — R1030 Lower abdominal pain, unspecified: Secondary | ICD-10-CM | POA: Diagnosis not present

## 2016-06-23 MED ORDER — MAGNESIUM CITRATE PO SOLN: 1.0000 | Freq: Once | ORAL | 3 refills | Status: AC

## 2016-06-23 NOTE — Progress Notes (Signed)
Pre visit review using our clinic review tool, if applicable. No additional management support is needed unless otherwise documented below in the visit note. 

## 2016-06-23 NOTE — Patient Instructions (Signed)
We have sent in magnesium citrate to help with the constipation. Drink a bottle of it today and then 1 bottle tomorrow if you are not able to go.  I would recommend to buy some stool softener over the counter called docusate or colace (you can ask the pharmacist if needed) to start taking for the next couple of days to help everything be easy to pass so you don't have to strain.   We are checking the x-ray and will call you back with the results.

## 2016-06-23 NOTE — Assessment & Plan Note (Signed)
Rx for magnesium citrate and checking x-ray for obstruction. Clinically does not appear obstructed. Advised to also start taking docusate for relief. Drink plenty of liquids and gradually add fiber to diet.

## 2016-06-23 NOTE — Progress Notes (Signed)
   Subjective:    Patient ID: Evan Escobar, male    DOB: Apr 28, 1940, 76 y.o.   MRN: [id number]  HPI The patient is a 76 YO man coming in for constipation and bloating. Going on for about 1-2 weeks now. Passing small balls of stool intermittently. He took milk of magnesia twice which typically helps him and this did not help. Passing gas still. Eating and drinking okay. Some bloating. No abdominal pain. More belching and GERD pains. Diet is the same. No fevers or chills. No blood in stool.   Review of Systems  Constitutional: Negative for activity change, appetite change, chills, fatigue, fever and unexpected weight change.  Respiratory: Negative.   Cardiovascular: Negative.   Gastrointestinal: Positive for abdominal distention, constipation and nausea. Negative for abdominal pain, diarrhea and vomiting.       GERD  Musculoskeletal: Negative.   Neurological: Negative.       Objective:   Physical Exam  Constitutional: He is oriented to person, place, and time. He appears well-developed and well-nourished.  HENT:  Head: Normocephalic and atraumatic.  Eyes: EOM are normal.  Cardiovascular: Normal rate and regular rhythm.   Pulmonary/Chest: Effort normal and breath sounds normal.  Abdominal: Soft. Bowel sounds are normal. He exhibits distension. There is no tenderness. There is no rebound and no guarding.  Mild distention, no rebound or guarding, BS present  Musculoskeletal: He exhibits no edema.  Neurological: He is alert and oriented to person, place, and time.  Skin: Skin is warm and dry.   Vitals:   06/23/16 1024  BP: 132/72  Pulse: 60  Resp: 12  Temp: 98.6 F (37 C)  TempSrc: Oral  SpO2: 100%  Weight: 152 lb (68.9 kg)  Height: 5\' 7"  (1.702 m)      Assessment & Plan:

## 2016-06-30 ENCOUNTER — Other Ambulatory Visit: Payer: Self-pay | Admitting: Internal Medicine

## 2016-07-12 ENCOUNTER — Other Ambulatory Visit: Payer: Self-pay | Admitting: Neurology

## 2016-07-15 ENCOUNTER — Encounter: Payer: Self-pay | Admitting: Nurse Practitioner

## 2016-07-15 ENCOUNTER — Ambulatory Visit (INDEPENDENT_AMBULATORY_CARE_PROVIDER_SITE_OTHER): Payer: Medicare Other | Admitting: Nurse Practitioner

## 2016-07-15 VITALS — BP 160/79 | HR 60 | Ht 67.0 in | Wt 152.8 lb

## 2016-07-15 DIAGNOSIS — R569 Unspecified convulsions: Secondary | ICD-10-CM

## 2016-07-15 MED ORDER — LEVETIRACETAM 500 MG PO TABS: 500.0000 mg | ORAL_TABLET | Freq: Two times a day (BID) | ORAL | 3 refills | Status: DC

## 2016-07-15 NOTE — Progress Notes (Signed)
[facility]  PATIENT: Evan Escobar DOB: 1940/09/14   REASON FOR VISIT: Follow-up for seizure disorder HISTORY FROM: Patient    HISTORY OF PRESENT ILLNESS: UPDATE 05/29/2018CM Evan Escobar, 76 year old male returns for follow-up history of epilepsy last seizure occurring September 2014. He is currently on Keppra 500 twice daily tolerating the medication without side effects. He remains fairly active. Appetite is good he is sleeping well. MRI and EEG in the past been normal. He is driving without difficulty. He returns for reevaluation   06/12/15 Evan Escobar is a 76 years old right-handed male, following up for epilepsy,  He was previously evaluated by Evan Escobar who has left practice, and followed up by [place] practitioner, he presented with one seizure in September 2014, witnessed by his wife, morning sound, body tonic-clonic shaking, he woke up on the ambulance was brought to the emergency room,  Per patient, he was drinking daily use cocaine frequently during that period of time, about 1 year prior to his seizure, he had frequent spells of feeling going to pass out, could not respond to surroundings.  I have personally reviewed MRI of brain with without contrast in September 2014 that was normal, EEG was normal in September 2014  He was started on Keppra 500 mg twice a day, he has been compliant with his medications, he is no longer drinking or using illicit drugs, there was no recurrent spells, there is no generalized seizure.  He is driving, per ED record, he suffered a T-bone injury in February 2017, there was no seizure activity described, I personally reviewed repeat CAT scan of the brain that was normal.   REVIEW OF SYSTEMS: Full 14 system review of systems performed and notable only for those listed, all others are neg:  Constitutional: neg  Cardiovascular: neg Ear/Nose/Throat: neg  Skin: neg Eyes: neg Respiratory: neg Gastroitestinal:  neg  Hematology/Lymphatic: neg  Endocrine: neg Musculoskeletal: Joint pain Allergy/Immunology: neg Neurological: History of seizure activity Psychiatric: neg Sleep : neg   ALLERGIES: No Known Allergies  HOME MEDICATIONS: Outpatient Medications Prior to Visit  Medication Sig Dispense Refill  . aspirin 81 MG tablet Take 81 mg by mouth daily.    . calcium-vitamin D (OSCAL WITH D) 500-200 MG-UNIT tablet Take 1 tablet by mouth.    . cycloSPORINE (RESTASIS) 0.05 % ophthalmic emulsion Place 1 drop into both eyes 2 (two) times daily.    Marland Kitchen doxazosin (CARDURA) 4 MG tablet TAKE 1 TABLET BY MOUTH DAILY 90 tablet 1  . latanoprost (XALATAN) 0.005 % ophthalmic solution     . levETIRAcetam (KEPPRA) 500 MG tablet Take 1 tablet (500 mg total) by mouth 2 (two) times daily. 180 tablet 4  . lisinopril (PRINIVIL,ZESTRIL) 10 MG tablet Take 1 tablet (10 mg total) by mouth daily. 90 tablet 3  . Multiple Vitamin (MULTIVITAMIN) tablet Take 1 tablet by mouth daily.     No facility-administered medications prior to visit.     PAST MEDICAL HISTORY: Past Medical History:  Diagnosis Date  . Glaucoma   . Hypertension   . Normocytic anemia 09/25/2012  . Polyp, sigmoid colon 11/05/2011  . Seizure ([place])   . Seizures ([place]) 03/17/2013    PAST SURGICAL HISTORY: Past Surgical History:  Procedure Laterality Date  . CATARACT EXTRACTION, BILATERAL    . GLAUCOMA REPAIR  10 yrs ago    FAMILY HISTORY: Family History  Problem Relation Age of Onset  . Stroke Mother   . High blood pressure Mother   .  Stroke Father   . High blood pressure Father   . Colon cancer Neg Hx     SOCIAL HISTORY: Social History   Social History  . Marital status: Married    Spouse name: Evan Escobar  . Number of children: 1  . Years of education: 26   Occupational History  . retired    Social History Main Topics  . Smoking status: Former Smoker    Packs/day: 1.00    Types: Cigarettes    Quit date: 02/18/2007  . Smokeless tobacco:  Never Used     Comment: not sure but a while ago; quit smoking and ETOH  . Alcohol use No     Comment: quit in 2009  . Drug use: No  . Sexual activity: Not on file   Other Topics Concern  . Not on file   Social History Narrative   Patient lives at home with his wife Evan Escobar). Patient is retired. Patient has 11 th grade education.    Caffeine- one cup of coffee daily and one soda.   Right handed.     PHYSICAL EXAM  Vitals:   07/15/16 0831  BP: (!) 160/79  Pulse: 60  Weight: 152 lb 12.8 oz (69.3 kg)  Height: 5\' 7"  (1.702 m)   Body mass index is 23.93 kg/m.  Generalized: Well developed, in no acute distress  Head: normocephalic and atraumatic,. Oropharynx benign  Neck: Supple, no carotid bruits  Musculoskeletal: No deformity   Neurological examination   Mentation: Alert oriented to time, place, history taking. Attention span and concentration appropriate. Recent and remote memory intact.  Follows all commands speech and language fluent.   Cranial nerve II-XII: Pupils were equal round reactive to light extraocular movements were full, visual field were full on confrontational test. Facial sensation and strength were normal. hearing was intact to finger rubbing bilaterally. Uvula tongue midline. head turning and shoulder shrug were normal and symmetric.Tongue protrusion into cheek strength was normal. Motor: normal bulk and tone, full strength in the BUE, BLE, fine finger movements normal, no pronator drift. No focal weakness Sensory: normal and symmetric to light touch, in the upper and lower extremities Coordination: finger-nose-finger, heel-to-shin bilaterally, no dysmetria Reflexes: Symmetric upper and lower, plantar responses were flexor bilaterally. Gait and Station: Rising up from seated position without assistance, normal stance,  moderate stride, good arm swing, smooth turning, able to perform tiptoe, and heel walking without difficulty. Tandem gait is steady. No  assistive device  DIAGNOSTIC DATA (LABS, IMAGING, TESTING) - I reviewed patient records, labs, notes, testing and imaging myself where available.  Lab Results  Component Value Date   WBC 4.2 08/28/2015   HGB 11.3 (L) 08/28/2015   HCT 33.9 (L) 08/28/2015   MCV 82.1 08/28/2015   PLT 157.0 08/28/2015      Component Value Date/Time   NA 138 03/10/2016 1001   K 4.4 03/10/2016 1001   CL 102 03/10/2016 1001   CO2 30 03/10/2016 1001   GLUCOSE 93 03/10/2016 1001   BUN 16 03/10/2016 1001   CREATININE 1.24 03/10/2016 1001   CALCIUM 9.5 03/10/2016 1001   PROT 7.2 03/10/2016 1001   ALBUMIN 3.9 03/10/2016 1001   AST 17 03/10/2016 1001   ALT 11 03/10/2016 1001   ALKPHOS 31 (L) 03/10/2016 1001   BILITOT 0.5 03/10/2016 1001   GFRNONAA 49 (L) 02/08/2014 2125   GFRAA 56 (L) 02/08/2014 2125   Lab Results  Component Value Date   CHOL 187 08/28/2015   HDL 54.40  08/28/2015   LDLCALC 120 (H) 08/28/2015   TRIG 63.0 08/28/2015   CHOLHDL 3 08/28/2015   No results found for: HGBA1C  Lab Results  Component Value Date   TSH 1.31 05/13/2013      ASSESSMENT AND PLAN  76 y.o. year old male  has a past medical history of  Seizure ([place]); here To follow-up. Last seizure event in 2014.   Continue Keppra 500 twice daily will refill for one year Call for any seizure activity Discussed seizure precautions  Follow-up yearly and when necessary I spent 15 min  in total face to face time with the patient more than 50% of which was spent counseling and coordination of care, reviewing test results reviewing medications and discussing and reviewing the diagnosis of seizure disorder and seizure precautions and the importance of medication management. Take meds as directed  [name], [facility], [facility], APRN  [facility] [address] [place], [place] [address] [phone]

## 2016-07-15 NOTE — Patient Instructions (Signed)
Continue Keppra 500 twice daily will refill for one year Call for any seizure activity Follow-up yearly and when necessary  

## 2016-07-16 NOTE — Progress Notes (Signed)
I have reviewed and agreed above plan. 

## 2016-07-23 DIAGNOSIS — H401133 Primary open-angle glaucoma, bilateral, severe stage: Secondary | ICD-10-CM | POA: Diagnosis not present

## 2016-07-24 NOTE — Progress Notes (Addendum)
Subjective:   Evan Escobar is a 76 y.o. male who presents for Medicare Annual/Subsequent preventive examination.  Patient c/o being unbalanced when walking. He also explains that he goes to the bathroom very frequently and is having a hard time staying continent. He does not see urology.  Review of Systems:  No ROS.  Medicare Wellness Visit.  Cardiac Risk Factors include: advanced age (>54men, >38 women);dyslipidemia;male gender  Sleep patterns: has frequent nighttime awakenings, gets up 4-5 times nightly to void and sleeps 6-7 hours nightly.    Home Safety/Smoke Alarms: Feels safe in home. Smoke alarms in place.  Living environment; residence and Firearm Safety: 1-story house/ trailer, no firearms., Lives with wife, no needs for DME Seat Belt Safety/Bike Helmet: Wears seat belt.   Counseling:   Eye Exam-  appointment yearly Dental- resources provided  Male:   CCS- Last 11/05/11, precancerous polyps, recall 5 years   PSA-  Lab Results  Component Value Date   PSA 2.86 05/13/2013       Objective:    Vitals: BP 138/74   Pulse (!) 54   Temp 97.7 F (36.5 C)   Resp 20   Ht 5\' 7"  (1.702 m)   Wt 151 lb (68.5 kg)   SpO2 98%   BMI 23.65 kg/m   Body mass index is 23.65 kg/m.  Tobacco History  Smoking Status  . Former Smoker  . Packs/day: 1.00  . Types: Cigarettes  . Quit date: 02/18/2007  Smokeless Tobacco  . Never Used    Comment: not sure but a while ago; quit smoking and ETOH     Counseling given: Not Answered   Past Medical History:  Diagnosis Date  . Glaucoma   . Hypertension   . Normocytic anemia 09/25/2012  . Polyp, sigmoid colon 11/05/2011  . Seizure ([place])   . Seizures ([place]) 03/17/2013   Past Surgical History:  Procedure Laterality Date  . CATARACT EXTRACTION, BILATERAL    . GLAUCOMA REPAIR  10 yrs ago   Family History  Problem Relation Age of Onset  . Stroke Mother   . High blood pressure Mother   . Stroke Father   . High blood pressure  Father   . Colon cancer Neg Hx    History  Sexual Activity  . Sexual activity: Not on file    Outpatient Encounter Prescriptions as of 07/25/2016  Medication Sig  . aspirin 81 MG tablet Take 81 mg by mouth daily.  . calcium-vitamin D (OSCAL WITH D) 500-200 MG-UNIT tablet Take 1 tablet by mouth.  . cycloSPORINE (RESTASIS) 0.05 % ophthalmic emulsion Place 1 drop into both eyes 2 (two) times daily.  Marland Kitchen doxazosin (CARDURA) 4 MG tablet TAKE 1 TABLET BY MOUTH DAILY  . latanoprost (XALATAN) 0.005 % ophthalmic solution   . levETIRAcetam (KEPPRA) 500 MG tablet Take 1 tablet (500 mg total) by mouth 2 (two) times daily.  Marland Kitchen lisinopril (PRINIVIL,ZESTRIL) 10 MG tablet Take 1 tablet (10 mg total) by mouth daily.  . Multiple Vitamin (MULTIVITAMIN) tablet Take 1 tablet by mouth daily.   No facility-administered encounter medications on file as of 07/25/2016.     Activities of Daily Living In your present state of health, do you have any difficulty performing the following activities: 07/25/2016  Hearing? N  Vision? N  Difficulty concentrating or making decisions? N  Walking or climbing stairs? N  Dressing or bathing? N  Doing errands, shopping? N  Preparing Food and eating ? N  Using the Toilet? N  In the past six months, have you accidently leaked urine? N  Do you have problems with loss of bowel control? N  Managing your Medications? N  Managing your Finances? N  Housekeeping or managing your Housekeeping? N  Some recent data might be hidden    Patient Care Team: [name], MD as PCP - General (Internal Medicine) Marcial Pacas, MD as Consulting Physician (Neurology) Paulla Dolly Tamala Fothergill, DPM as Consulting Physician (Podiatry)   Assessment:    Physical assessment deferred to PCP.  Exercise Activities and Dietary recommendations Current Exercise Habits: The patient does not participate in regular exercise at present, Exercise limited by: Other - see comments (leg weakness and  imbalance)  Diet (meal preparation, eat out, water intake, caffeinated beverages, dairy products, fruits and vegetables): in general, a "healthy" diet  , well balanced, low fat/ cholesterol, low salt eats a variety of fruits and vegetables daily, limits salt, fat/cholesterol, sugar, caffeine, drinks 6-8 glasses of water daily.  Diet education for high fiber was attached to patient's AVS.   Goals      Patient Stated   . maintain your health (pt-stated)          Will continue walking at mall x 2 days a week x 60 minutes; Enjoys working on cars; Plays the guitar for relaxation      Other   . Do more for my wife          Take care of my wife everyday the very best I can.     . Exercise 150 minutes per week (moderate activity)          Keep walking 2 days; would like for you to walk 3 days. Or try the class with music       Fall Risk Fall Risk  07/25/2016 04/30/2015 08/17/2014  Falls in the past year? No No No   Depression Screen PHQ 2/9 Scores 07/25/2016 04/30/2015 08/17/2014  PHQ - 2 Score 0 0 0    Cognitive Function MMSE - Mini Mental State Exam 07/25/2016 08/17/2014  Not completed: - Unable to complete  Orientation to time 5 -  Orientation to Place 5 -  Registration 3 -  Attention/ Calculation 5 -  Recall 3 -  Language- name 2 objects 2 -  Language- repeat 1 -  Language- follow 3 step command 3 -  Language- read & follow direction 1 -  Write a sentence 1 -  Copy design 1 -  Total score 30 -        Immunization History  Administered Date(s) Administered  . Influenza, High Dose Seasonal PF 11/17/2012  . Influenza,inj,Quad PF,36+ Mos 11/14/2013, 01/01/2016  . Influenza-Unspecified 01/18/2015  . Pneumococcal Conjugate-13 04/16/2015  . Pneumococcal-Unspecified 02/17/2010  . Tdap 08/17/2014   Screening Tests Health Maintenance  Topic Date Due  . INFLUENZA VACCINE  09/17/2016  . COLONOSCOPY  11/04/2016  . TETANUS/TDAP  08/16/2024  . PNA vac Low Risk Adult   Completed      Plan:     Scheduled appointment 07/28/16 with provider N[place] to follow-up regarding concerns of feeling unbalanced when walking and urinary issues.   Use your cane to help you walk steady  Continue to eat heart healthy diet (full of fruits, vegetables, whole grains, lean protein, water--limit salt, fat, and sugar intake) and increase physical activity as tolerated.  Continue doing brain stimulating activities (puzzles, reading, adult coloring books, staying active) to keep memory sharp.   I have personally reviewed and noted  the following in the patient's chart:   . Medical and social history . Use of alcohol, tobacco or illicit drugs  . Current medications and supplements . Functional ability and status . Nutritional status . Physical activity . Advanced directives . List of other physicians . Vitals . Screenings to include cognitive, depression, and falls . Referrals and appointments  In addition, I have reviewed and discussed with patient certain preventive protocols, quality metrics, and best practice recommendations. A written personalized care plan for preventive services as well as general preventive health recommendations were provided to patient.     Michiel Cowboy, RN  07/25/2016   Medical screening examination/treatment/procedure(s) were performed by non-physician practitioner and as supervising provider I was immediately available for consultation/collaboration. Recommend PSA for prostate cancer screening. I agree with treatment plan. Mauricio Po, FNP

## 2016-07-24 NOTE — Progress Notes (Signed)
Pre visit review using our clinic review tool, if applicable. No additional management support is needed unless otherwise documented below in the visit note. 

## 2016-07-25 ENCOUNTER — Ambulatory Visit (INDEPENDENT_AMBULATORY_CARE_PROVIDER_SITE_OTHER): Payer: Medicare Other | Admitting: *Deleted

## 2016-07-25 VITALS — BP 138/74 | HR 54 | Temp 97.7°F | Resp 20 | Ht 67.0 in | Wt 151.0 lb

## 2016-07-25 DIAGNOSIS — Z0000 Encounter for general adult medical examination without abnormal findings: Secondary | ICD-10-CM

## 2016-07-25 NOTE — Patient Instructions (Addendum)
Use your cane to help you walk steady  Continue to eat heart healthy diet (full of fruits, vegetables, whole grains, lean protein, water--limit salt, fat, and sugar intake) and increase physical activity as tolerated.  Continue doing brain stimulating activities (puzzles, reading, adult coloring books, staying active) to keep memory sharp.    Mr. Evan Escobar , Thank you for taking time to come for your Medicare Wellness Visit. I appreciate your ongoing commitment to your health goals. Please review the following plan we discussed and let me know if I can assist you in the future.   These are the goals we discussed: Goals      Patient Stated   . maintain your health (pt-stated)          Will continue walking at mall x 2 days a week x 60 minutes; Enjoys working on cars; Plays the guitar for relaxation      Other   . Do more for my wife          Take care of my wife everyday the very best I can.     . Exercise 150 minutes per week (moderate activity)          Keep walking 2 days; would like for you to walk 3 days. Or try the class with music        This is a list of the screening recommended for you and due dates:  Health Maintenance  Topic Date Due  . Flu Shot  09/17/2016  . Colon Cancer Screening  11/04/2016  . Tetanus Vaccine  08/16/2024  . Pneumonia vaccines  Completed     High-Fiber Diet Fiber, also called dietary fiber, is a type of carbohydrate found in fruits, vegetables, whole grains, and beans. A high-fiber diet can have many health benefits. Your health care provider may recommend a high-fiber diet to help:  Prevent constipation. Fiber can make your bowel movements more regular.  Lower your cholesterol.  Relieve hemorrhoids, uncomplicated diverticulosis, or irritable bowel syndrome.  Prevent overeating as part of a weight-loss plan.  Prevent heart disease, type 2 diabetes, and certain cancers.  What is my plan? The recommended daily intake of fiber  includes:  38 grams for men under age 64.  67 grams for men over age 29.  46 grams for women under age 71.  40 grams for women over age 24.  You can get the recommended daily intake of dietary fiber by eating a variety of fruits, vegetables, grains, and beans. Your health care provider may also recommend a fiber supplement if it is not possible to get enough fiber through your diet. What do I need to know about a high-fiber diet?  Fiber supplements have not been widely studied for their effectiveness, so it is better to get fiber through food sources.  Always check the fiber content on thenutrition facts label of any prepackaged food. Look for foods that contain at least 5 grams of fiber per serving.  Ask your dietitian if you have questions about specific foods that are related to your condition, especially if those foods are not listed in the following section.  Increase your daily fiber consumption gradually. Increasing your intake of dietary fiber too quickly may cause bloating, cramping, or gas.  Drink plenty of water. Water helps you to digest fiber. What foods can I eat? Grains Whole-grain breads. Multigrain cereal. Oats and oatmeal. Brown rice. Barley. Bulgur wheat. [place]. Bran muffins. Popcorn. Rye wafer crackers. Vegetables Sweet potatoes. Spinach. Kale.  Artichokes. Cabbage. Broccoli. Green peas. Carrots. Squash. Fruits Berries. Pears. Apples. Oranges. Avocados. Prunes and raisins. Dried figs. Meats and Other Protein Sources [place], kidney, pinto, and soy beans. Split peas. Lentils. Nuts and seeds. Dairy Fiber-fortified yogurt. Beverages Fiber-fortified soy milk. Fiber-fortified orange juice. Other Fiber bars. The items listed above may not be a complete list of recommended foods or beverages. Contact your dietitian for more options. What foods are not recommended? Grains White bread. Pasta made with refined flour. White rice. Vegetables Fried potatoes. Canned  vegetables. Well-cooked vegetables. Fruits Fruit juice. Cooked, strained fruit. Meats and Other Protein Sources Fatty cuts of meat. Fried Sales executive or fried fish. Dairy Milk. Yogurt. Cream cheese. Sour cream. Beverages Soft drinks. Other Cakes and pastries. Butter and oils. The items listed above may not be a complete list of foods and beverages to avoid. Contact your dietitian for more information. What are some tips for including high-fiber foods in my diet?  Eat a wide variety of high-fiber foods.  Make sure that half of all grains consumed each day are whole grains.  Replace breads and cereals made from refined flour or white flour with whole-grain breads and cereals.  Replace white rice with brown rice, bulgur wheat, or millet.  Start the day with a breakfast that is high in fiber, such as a cereal that contains at least 5 grams of fiber per serving.  Use beans in place of meat in soups, salads, or pasta.  Eat high-fiber snacks, such as berries, raw vegetables, nuts, or popcorn. This information is not intended to replace advice given to you by your health care provider. Make sure you discuss any questions you have with your health care provider. Document Released: 02/03/2005 Document Revised: 07/12/2015 Document Reviewed: 07/19/2013 Elsevier Interactive Patient Education  2017 [employer].

## 2016-07-28 ENCOUNTER — Encounter: Payer: Self-pay | Admitting: Nurse Practitioner

## 2016-07-28 ENCOUNTER — Ambulatory Visit (INDEPENDENT_AMBULATORY_CARE_PROVIDER_SITE_OTHER): Payer: Medicare Other | Admitting: Nurse Practitioner

## 2016-07-28 ENCOUNTER — Other Ambulatory Visit (INDEPENDENT_AMBULATORY_CARE_PROVIDER_SITE_OTHER): Payer: Medicare Other

## 2016-07-28 ENCOUNTER — Telehealth: Payer: Self-pay | Admitting: Internal Medicine

## 2016-07-28 VITALS — BP 126/64 | HR 63 | Temp 97.8°F | Ht 67.0 in | Wt 150.0 lb

## 2016-07-28 DIAGNOSIS — R350 Frequency of micturition: Secondary | ICD-10-CM

## 2016-07-28 DIAGNOSIS — N3000 Acute cystitis without hematuria: Secondary | ICD-10-CM | POA: Diagnosis not present

## 2016-07-28 DIAGNOSIS — I10 Essential (primary) hypertension: Secondary | ICD-10-CM | POA: Diagnosis not present

## 2016-07-28 DIAGNOSIS — R2681 Unsteadiness on feet: Secondary | ICD-10-CM

## 2016-07-28 DIAGNOSIS — N401 Enlarged prostate with lower urinary tract symptoms: Secondary | ICD-10-CM

## 2016-07-28 LAB — URINALYSIS, ROUTINE W REFLEX MICROSCOPIC
Bilirubin Urine: NEGATIVE
Hgb urine dipstick: NEGATIVE
Ketones, ur: NEGATIVE
Leukocytes, UA: NEGATIVE
Nitrite: NEGATIVE
RBC / HPF: NONE SEEN (ref 0–?)
Specific Gravity, Urine: <=1.005 — AB (ref 1.000–1.030)
Total Protein, Urine: NEGATIVE
Urine Glucose: NEGATIVE
Urobilinogen, UA: 0.2 (ref 0.0–1.0)
pH: 7 (ref 5.0–8.0)

## 2016-07-28 MED ORDER — TADALAFIL 5 MG PO TABS: 5.0000 mg | ORAL_TABLET | Freq: Every day | ORAL | 1 refills | Status: DC

## 2016-07-28 MED ORDER — SOLIFENACIN SUCCINATE 5 MG PO TABS: 5.0000 mg | ORAL_TABLET | Freq: Every day | ORAL | 1 refills | Status: DC

## 2016-07-28 MED ORDER — CIPROFLOXACIN HCL 500 MG PO TABS: 500.0000 mg | ORAL_TABLET | Freq: Two times a day (BID) | ORAL | 0 refills | Status: DC

## 2016-07-28 NOTE — Telephone Encounter (Signed)
Pt called and said that the prescription for tadalafil (CIALIS) 5 MG tablet that was sent to the pharmacy today is not covered by his insurance and is over $400. He wanted to know if something else could be sent in for him.

## 2016-07-28 NOTE — Telephone Encounter (Signed)
Please advise 

## 2016-07-28 NOTE — Telephone Encounter (Signed)
Left vm for pt to call back. Need to inform pt that Evan Escobar put in back on Cardura and add Vesicare to see if that will help.

## 2016-07-28 NOTE — Telephone Encounter (Signed)
I do not have any other cheaper option for BPH management. He will need to resume previous prescription of Cardura at this time.

## 2016-07-28 NOTE — Progress Notes (Signed)
Subjective:  Patient ID: Evan Escobar, male    DOB: 22-Feb-1940  Age: 76 y.o. MRN: [id number]  CC: Gait Problem (unstady gain,frequent urinate.)   HPI Increased urination: Hx of BPH. pcp changed from flomax to cardura. No dysuria.  Unsteady gait:  Chronic. No fall in last 50months.  Last colonoscopy done 10/2011. Polyps removed. He is not due till after 10/2016.  Outpatient Medications Prior to Visit  Medication Sig Dispense Refill  . aspirin 81 MG tablet Take 81 mg by mouth daily.    . calcium-vitamin D (OSCAL WITH D) 500-200 MG-UNIT tablet Take 1 tablet by mouth.    . cycloSPORINE (RESTASIS) 0.05 % ophthalmic emulsion Place 1 drop into both eyes 2 (two) times daily.    Marland Kitchen latanoprost (XALATAN) 0.005 % ophthalmic solution     . levETIRAcetam (KEPPRA) 500 MG tablet Take 1 tablet (500 mg total) by mouth 2 (two) times daily. 180 tablet 3  . lisinopril (PRINIVIL,ZESTRIL) 10 MG tablet Take 1 tablet (10 mg total) by mouth daily. 90 tablet 3  . Multiple Vitamin (MULTIVITAMIN) tablet Take 1 tablet by mouth daily.    Marland Kitchen doxazosin (CARDURA) 4 MG tablet TAKE 1 TABLET BY MOUTH DAILY 90 tablet 1   No facility-administered medications prior to visit.     ROS Review of Systems  Constitutional: Negative for weight loss.  Genitourinary: Positive for frequency and urgency. Negative for dysuria, flank pain and hematuria.  Musculoskeletal: Negative for back pain, falls, joint pain and myalgias.  Neurological: Negative for tingling, tremors and focal weakness.  Psychiatric/Behavioral: Negative for memory loss.    Objective:  BP 126/64   Pulse 63   Temp 97.8 F (36.6 C)   Ht 5\' 7"  (1.702 m)   Wt 150 lb (68 kg)   SpO2 98%   BMI 23.49 kg/m   BP Readings from Last 3 Encounters:  07/28/16 126/64  07/25/16 138/74  07/15/16 (!) 160/79    Wt Readings from Last 3 Encounters:  07/28/16 150 lb (68 kg)  07/25/16 151 lb (68.5 kg)  07/15/16 152 lb 12.8 oz (69.3 kg)    Physical Exam    Constitutional: He is oriented to person, place, and time. No distress.  Cardiovascular: Normal rate, regular rhythm and normal heart sounds.   Pulmonary/Chest: Effort normal and breath sounds normal.  Abdominal: Soft. Bowel sounds are normal. He exhibits no distension.  Musculoskeletal: Normal range of motion. He exhibits no edema.  Neurological: He is alert and oriented to person, place, and time. Coordination abnormal.  Skin: Skin is warm and dry.  Vitals reviewed.   Lab Results  Component Value Date   WBC 4.2 08/28/2015   HGB 11.3 (L) 08/28/2015   HCT 33.9 (L) 08/28/2015   PLT 157.0 08/28/2015   GLUCOSE 93 03/10/2016   CHOL 187 08/28/2015   TRIG 63.0 08/28/2015   HDL 54.40 08/28/2015   LDLCALC 120 (H) 08/28/2015   ALT 11 03/10/2016   AST 17 03/10/2016   NA 138 03/10/2016   K 4.4 03/10/2016   CL 102 03/10/2016   CREATININE 1.24 03/10/2016   BUN 16 03/10/2016   CO2 30 03/10/2016   TSH 1.31 05/13/2013   PSA 2.86 05/13/2013    Dg Abd 2 Views  Result Date: 06/23/2016 CLINICAL DATA:  Lower abdominal pain, constipation for 1-2 weeks EXAM: ABDOMEN - 2 VIEW COMPARISON:  None. FINDINGS: Moderate stool burden within the colon. Calcified phleboliths in the pelvis. No evidence of bowel obstruction or free air. No organomegaly.  No acute bony abnormality. Visualized lung bases are clear. IMPRESSION: Moderate stool burden.  No acute findings. Electronically Signed   By: Rolm Baptise M.D.   On: 06/23/2016 11:57    Assessment & Plan:   Cleave was seen today for gait problem.  Diagnoses and all orders for this visit:  Unsteady gait -     Ambulatory referral to Physical Therapy  Essential hypertension, benign  Benign prostatic hyperplasia with urinary frequency -     Urinalysis, Routine w reflex microscopic; Future -     Discontinue: tadalafil (CIALIS) 5 MG tablet; Take 1 tablet (5 mg total) by mouth daily. -     ciprofloxacin (CIPRO) 500 MG tablet; Take 1 tablet (500 mg total) by  mouth 2 (two) times daily.  Acute cystitis without hematuria -     solifenacin (VESICARE) 5 MG tablet; Take 1 tablet (5 mg total) by mouth daily. -     ciprofloxacin (CIPRO) 500 MG tablet; Take 1 tablet (500 mg total) by mouth 2 (two) times daily.   I have discontinued Mr. Elwood tadalafil. I have also changed his doxazosin. Additionally, I am having him start on solifenacin and ciprofloxacin. Lastly, I am having him maintain his aspirin, multivitamin, latanoprost, cycloSPORINE, calcium-vitamin D, lisinopril, and levETIRAcetam.  Meds ordered this encounter  Medications  . DISCONTD: tadalafil (CIALIS) 5 MG tablet    Sig: Take 1 tablet (5 mg total) by mouth daily.    Dispense:  30 tablet    Refill:  1    Order Specific Question:   Supervising Provider    Answer:   Cassandria Anger [[id number]]  . solifenacin (VESICARE) 5 MG tablet    Sig: Take 1 tablet (5 mg total) by mouth daily.    Dispense:  30 tablet    Refill:  1    Order Specific Question:   Supervising Provider    Answer:   Cassandria Anger [[id number]]  . doxazosin (CARDURA) 4 MG tablet    Sig: Take 1 tablet (4 mg total) by mouth daily.    Dispense:  90 tablet    Refill:  1    Order Specific Question:   Supervising Provider    Answer:   Cassandria Anger [[id number]]  . ciprofloxacin (CIPRO) 500 MG tablet    Sig: Take 1 tablet (500 mg total) by mouth 2 (two) times daily.    Dispense:  14 tablet    Refill:  0    Order Specific Question:   Supervising Provider    Answer:   Cassandria Anger [[id number]]   Follow-up: Return in about 4 weeks (around 08/25/2016) for BPH with urinary symptoms.  Wilfred Lacy, NP

## 2016-07-29 NOTE — Telephone Encounter (Signed)
Pt's aware of test result and medications

## 2016-07-29 NOTE — Telephone Encounter (Signed)
Patient called back.  Gave Charlottes response on labs.  Please follow up with patient at [phone].

## 2016-08-11 ENCOUNTER — Encounter: Payer: Self-pay | Admitting: Physical Therapy

## 2016-08-11 ENCOUNTER — Ambulatory Visit: Payer: Medicare Other | Attending: Nurse Practitioner | Admitting: Physical Therapy

## 2016-08-11 DIAGNOSIS — R2689 Other abnormalities of gait and mobility: Secondary | ICD-10-CM | POA: Diagnosis not present

## 2016-08-11 NOTE — Therapy (Signed)
[facility] [address] [place], [place], [address] Phone: [phone]   Fax:  [phone]  Physical Therapy Evaluation  Patient Details  Name: Evan Escobar MRN: [id number] Date of Birth: 1940/07/02 Referring Provider: Wilfred Lacy, NP  Encounter Date: 08/11/2016      PT End of Session - 08/11/16 1646    Visit Number 1   Number of Visits 1   PT Start Time 0800   PT Stop Time 0847   PT Time Calculation (min) 47 min   Equipment Utilized During Treatment Gait belt   Activity Tolerance Patient limited by fatigue   Behavior During Therapy [facility] for tasks assessed/performed      Past Medical History:  Diagnosis Date  . Glaucoma   . Hypertension   . Normocytic anemia 09/25/2012  . Polyp, sigmoid colon 11/05/2011  . Seizure ([place])   . Seizures ([place]) 03/17/2013    Past Surgical History:  Procedure Laterality Date  . CATARACT EXTRACTION, BILATERAL    . GLAUCOMA REPAIR  10 yrs ago    There were no vitals filed for this visit.       Subjective Assessment - 08/11/16 0804    Subjective Not stable walking anymore. Describes feeling like he sways left and right. Denies dizziness. Has been going on for several months. (wife agrees) Confirms reports problems with incontinence (urine); also constipation; loss of appetite;    Patient is accompained by: Family member  wife, Terrence Dupont   Limitations Sitting   How long can you sit comfortably? maybe 30 minutes, then when he stands he is unsteady   Currently in Pain? Yes   Pain Score --  he can't rate    Pain Location Leg   Pain Orientation Right;Left;Proximal;Posterior   Pain Descriptors / Indicators Cramping;Sore   Pain Type Chronic pain   Pain Onset More than a month ago   Pain Frequency Constant   Aggravating Factors  not that he can tell   Pain Relieving Factors none, there all the time   Effect of Pain on Daily Activities not really; he pushes through             [facility] PT Assessment - 08/11/16 0754      Assessment   Medical Diagnosis Unsteady Gait   Referring Provider Wilfred Lacy, NP   Onset Date/Surgical Date --  ~April 2018   Prior Therapy none     Precautions   Precautions Fall   Precaution Comments near fall during PT eval     Balance Screen   Has the patient fallen in the past 6 months No  has had near falls   Has the patient had a decrease in activity level because of a fear of falling?  Yes   Is the patient reluctant to leave their home because of a fear of falling?  No     Home Environment   Living Environment Private residence   Living Arrangements Spouse/significant other   Type of [place] to enter   Entrance Stairs-Number of Steps 3   Entrance Stairs-Rails None  kitchen entrance   [place] One level   [place] - single point;Cane - quad  tubj/shower     Prior Function   Level of Independence Independent   Vocation Retired   Art therapist   Leisure play guitar     Cognition   Overall Cognitive Status Impaired/Different from baseline   Area of Impairment Memory  Memory Impaired   Memory Impairment Retrieval deficit;Decreased short term memory  appointments; list of things he should do     Observation/Other Assessments   Observations --   Focus on Therapeutic Outcomes (FOTO)  initiated but not completed     Sensation   Light Touch Impaired by gross assessment   Additional Comments describes numbness in LLE (L2-L5 dermatomes); proprioception intact at ankle and 1st toe     Coordination   Fine Motor Movements are Fluid and Coordinated No  rapid toe tapping degrades on the left     ROM / Strength   AROM / PROM / Strength AROM;Strength     AROM   Overall AROM  Within functional limits for tasks performed     Strength   Overall Strength Deficits   Strength Assessment Site Hip;Knee;Ankle   Right/Left Hip Left   Left Hip Flexion 4-/5    Right/Left Knee Left   Left Knee Flexion 3+/5   Left Knee Extension 4/5     Transfers   Transfers Sit to Stand;Stand to Sit   Sit to Stand 7: Independent   Stand to Sit 7: Independent     Ambulation/Gait   Ambulation/Gait Yes   Ambulation/Gait Assistance 4: Min assist   Ambulation/Gait Assistance Details pt caught lt great toe x2 with LOB and PT assisted to recover   Ambulation Distance (Feet) 150 Feet   Assistive device None   Gait Pattern Step-through pattern;Decreased step length - right;Decreased stance time - left;Poor foot clearance - left   Ambulation Surface Level;Indoor   Gait velocity 20/7.04=2.84 ft/sec     Standardized Balance Assessment   Standardized Balance Assessment Berg Balance Test     Berg Balance Test   Sit to Stand Able to stand without using hands and stabilize independently   Standing Unsupported Able to stand 2 minutes with supervision   Sitting with Back Unsupported but Feet Supported on Floor or Stool Able to sit safely and securely 2 minutes   Stand to Sit Sits safely with minimal use of hands   Transfers Able to transfer safely, minor use of hands   Standing Unsupported with Eyes Closed Able to stand 10 seconds with supervision   Standing Ubsupported with Feet Together Able to place feet together independently and stand for 1 minute with supervision   From Standing, Reach Forward with Outstretched Arm Can reach confidently >25 cm (10")   From Standing Position, Pick up Object from [place] to pick up shoe, needs supervision   From Standing Position, Turn to Look Behind Over each Shoulder Looks behind one side only/other side shows less weight shift   Turn 360 Degrees Needs close supervision or verbal cueing   Standing Unsupported, Alternately Place Feet on Step/Stool Able to complete >2 steps/needs minimal assist   Standing Unsupported, One Foot in Front Needs help to step but can hold 15 seconds   Standing on One Leg Tries to lift leg/unable to hold  3 seconds but remains standing independently   Total Score 39   Berg comment: score 37-45 significant risk of fall     Functional Gait  Assessment   Gait assessed  Yes   Gait Level Surface Walks 20 ft, slow speed, abnormal gait pattern, evidence for imbalance or deviates 10-15 in outside of the 12 in walkway width. Requires more than 7 sec to ambulate 20 ft.   Change in Gait Speed Cannot change speeds, deviates greater than 15 in outside 12 in walkway width, or loses  balance and has to reach for wall or be caught.   Gait with Horizontal Head Turns Performs head turns with moderate changes in gait velocity, slows down, deviates 10-15 in outside 12 in walkway width but recovers, can continue to walk.   Gait with Vertical Head Turns Performs task with slight change in gait velocity (eg, minor disruption to smooth gait path), deviates 6 - 10 in outside 12 in walkway width or uses assistive device   Gait and Pivot Turn Turns slowly, requires verbal cueing, or requires several small steps to catch balance following turn and stop   Ambulating Backwards Walks 20 ft, slow speed, abnormal gait pattern, evidence for imbalance, deviates 10-15 in outside 12 in walkway width.   FGA comment: full test not assessed as pt fatigued with testing with increasing episodes of catching left toe and/or unsteady, LOB            Objective measurements completed on examination: See above findings.                  PT Education - 08/11/16 1645    Education provided Yes   Education Details results of PT eval; proposed POC (pt reports unable to afford any additional therapy due to cost of co-pay); provided basic balance HEP with PT demonstrating technique   Person(s) Educated Patient;Spouse   Methods Explanation;Demonstration;Handout   Comprehension Verbalized understanding                     Plan - 08/11/16 1647    Clinical Impression Statement Patient presents for PT evaluation with  referral due to unsteady gait which began ~ 2 months ago. He scored only 39/56 on Berg Balance assessment and had 2 major losses of balance (required PT assist to recover and prevent fall). During discussion of evaluation results and establishing a plan of care, pt stated he could not afford a $35 copay per visit. Offered having him return for 1-2 visits, up to two weeks apart for education in HEP and he stated he could not afford that either. Provided him with basic balance exercises and verbally explained. Explained that we did not have time for patient to return demonstrate and emphasized to patient and his wife how to do these exercises standing in a corner with his back to the walls and a chair back in front of him for safety. No further PT scheduled per patient's request.    Clinical Presentation Evolving  cause of imbalance unknown   Clinical Decision Making Moderate   Rehab Potential Good   PT Frequency One time visit   Consulted and Agree with Plan of Care Patient;Family member/caregiver  wife   Family Member Consulted wife, Terrence Dupont      Patient will benefit from skilled therapeutic intervention in order to improve the following deficits and impairments:     Visit Diagnosis: Other abnormalities of gait and mobility - Plan: PT plan of care cert/re-cert      G-Codes - 74/94/49 1655    Functional Assessment Tool Used (Outpatient Only) Berg Balance 39/56   Functional Limitation Mobility: Walking and moving around   Mobility: Walking and Moving Around Current Status (Q7591) At least 40 percent but less than 60 percent impaired, limited or restricted   Mobility: Walking and Moving Around Goal Status (M3846) At least 40 percent but less than 60 percent impaired, limited or restricted   Mobility: Walking and Moving Around Discharge Status [phone]) At least 40 percent but less than 60  percent impaired, limited or restricted       Problem List Patient Active Problem List   Diagnosis Date  Noted  . Ear infection 02/22/2016  . Benign prostatic hyperplasia 08/28/2015  . Routine general medical examination at a health care facility 08/28/2015  . Allergic rhinitis 02/26/2015  . Myalgia 10/16/2014  . Constipation 04/26/2014  . Pain in joint, ankle and foot 02/14/2014  . Glaucoma 05/07/2013  . Essential hypertension, benign 05/07/2013  . Seizures ([place]) 03/17/2013  . Normocytic anemia 09/25/2012    Rexanne Mano, PT 08/11/2016, 4:59 PM  [facility] [address] [place], [place], [address] Phone: [phone]   Fax:  [phone]  Name: Jonovan Boedecker MRN: [id number] Date of Birth: 21-Mar-1940

## 2016-08-11 NOTE — Patient Instructions (Signed)
Do all balance exercises in a corner with a chair in front of you for safety.    Balance: Eyes Open - Bilateral (Varied Surfaces)    Stand, feet shoulder width, eyes open. Maintain balance 30-60 seconds. Repeat 2 times per set. Do 1 sets per session. Do 5 sessions per week. Repeat on compliant surface: foam, cushion, pillow.                           Feet Apart, Head Motion - Eyes Open    With eyes open, feet apart, move head slowly: up and down, then left and right Repeat 10 times per session. Do 1 sessions per day.  Copyright  VHI. All rights reserved.                         Feet Together, Head Motion - Eyes Open    With eyes open, feet together, move head slowly:  then left and right Repeat 10 times per session. Do 1 sessions per day.  Copyright  VHI. All rights reserved.                         Feet Partial Heel-Toe, Head Motion - Eyes Open    With eyes open, right foot partially in front of the other, move head slowly: up and down then left and right Repeat 10 times per session. Do 1 sessions per day. Copyright  VHI. All rights reserved.                         Feet Apart (Compliant Surface) Head Motion - Eyes Open    With eyes open, standing on compliant surface: feet shoulder width apart, move head slowly: up and down then left and right Repeat 10 times per session. Do 1 sessions per day. Copyright  VHI. All rights reserved.                        Feet Together (Compliant Surface) Head Motion - Eyes Open    With eyes open, standing on compliant surface:  feet together, move head slowly: up and down then left and right Repeat 10 times per session. Do 1 sessions per day. Copyright  VHI. All rights reserved.                        Feet Partial Heel-Toe (Compliant Surface) Head Motion - Eyes Open    With eyes open,  standing on compliant surface: , right foot partially in front of the other, move head slowly: up and down then left and right Repeat 10 times per session. Do 1 sessions per day. Copyright  VHI. All rights reserved.

## 2016-08-22 ENCOUNTER — Telehealth: Payer: Self-pay | Admitting: Nurse Practitioner

## 2016-08-22 NOTE — Telephone Encounter (Signed)
Stop medication. I can refer to urology.

## 2016-08-22 NOTE — Telephone Encounter (Signed)
That should be fine. 

## 2016-08-22 NOTE — Telephone Encounter (Signed)
Pt verbalized understand.   Pt was wondering if he can stop taking Cardura as well to see if this will stop? Please advise  Pt stated to hold off on the referral for urology for right now. He wants to see if stop vesicare and cardura will stop this problem.

## 2016-08-22 NOTE — Telephone Encounter (Signed)
Pt request to stop taking Vesicare due to difficulty urinate ( sprinkling and has to push to get full stream when urinate), pt stated this stated when he started to take this med. Please advise.

## 2016-08-25 NOTE — Telephone Encounter (Signed)
Patient called back.  Gave Charlottes response.

## 2016-08-25 NOTE — Telephone Encounter (Signed)
Left vm for pt to call back,need to inform massage below.  

## 2016-09-09 ENCOUNTER — Ambulatory Visit (INDEPENDENT_AMBULATORY_CARE_PROVIDER_SITE_OTHER): Payer: Medicare Other | Admitting: Nurse Practitioner

## 2016-09-09 ENCOUNTER — Encounter: Payer: Medicare Other | Admitting: Internal Medicine

## 2016-09-09 ENCOUNTER — Encounter: Payer: Self-pay | Admitting: Nurse Practitioner

## 2016-09-09 VITALS — BP 140/76 | HR 57 | Temp 97.7°F | Ht 67.0 in | Wt 147.0 lb

## 2016-09-09 DIAGNOSIS — Z136 Encounter for screening for cardiovascular disorders: Secondary | ICD-10-CM

## 2016-09-09 DIAGNOSIS — D649 Anemia, unspecified: Secondary | ICD-10-CM | POA: Diagnosis not present

## 2016-09-09 DIAGNOSIS — Z0001 Encounter for general adult medical examination with abnormal findings: Secondary | ICD-10-CM

## 2016-09-09 DIAGNOSIS — R350 Frequency of micturition: Secondary | ICD-10-CM

## 2016-09-09 DIAGNOSIS — N401 Enlarged prostate with lower urinary tract symptoms: Secondary | ICD-10-CM

## 2016-09-09 DIAGNOSIS — I10 Essential (primary) hypertension: Secondary | ICD-10-CM

## 2016-09-09 DIAGNOSIS — H9190 Unspecified hearing loss, unspecified ear: Secondary | ICD-10-CM | POA: Insufficient documentation

## 2016-09-09 DIAGNOSIS — Z13220 Encounter for screening for lipoid disorders: Secondary | ICD-10-CM

## 2016-09-09 DIAGNOSIS — K5900 Constipation, unspecified: Secondary | ICD-10-CM | POA: Diagnosis not present

## 2016-09-09 MED ORDER — LISINOPRIL 10 MG PO TABS: 10.0000 mg | ORAL_TABLET | Freq: Every day | ORAL | 3 refills | Status: DC

## 2016-09-09 MED ORDER — POLYETHYLENE GLYCOL 3350 17 GM/SCOOP PO POWD: 17.0000 g | Freq: Every day | ORAL | 1 refills | Status: DC

## 2016-09-09 NOTE — Progress Notes (Signed)
Subjective:    Patient ID: Evan Escobar, male    DOB: March 04, 1940, 76 y.o.   MRN: [id number]  Patient presents today for complete physical   HPI BPH with urinary frequency. No improvement with cardura. Unable to tolerate vesicare. will like referral to urology.  Unsteady gait: Denies any fall in last 15months. outpt rehab Referral was entered. Unable to afford outpt rehab due to copay of $35 per visit.  HTN: Controlled. BP Readings from Last 3 Encounters:  09/09/16 140/76  07/28/16 126/64  07/25/16 138/74   Constipation: Chronic. Use of laxative 2-3times a weeks.  Immunizations: (TDAP, Hep C screen, Pneumovax, Influenza, zoster)  Health Maintenance  Topic Date Due  . Flu Shot  09/17/2016  . Colon Cancer Screening  11/04/2016  . Tetanus Vaccine  08/16/2024  . Pneumonia vaccines  Completed   Diet:regular.  Weight:  Wt Readings from Last 3 Encounters:  09/09/16 147 lb (66.7 kg)  07/28/16 150 lb (68 kg)  07/25/16 151 lb (68.5 kg)   Exercise:walking.  Fall Risk: Fall Risk  07/25/2016 04/30/2015 08/17/2014  Falls in the past year? No No No   Home Safety:home with wife, no loose rugs, adequate lighting, grab bars in shower.  Depression/Suicide: Depression screen [facility] 2/9 07/25/2016 04/30/2015 08/17/2014  Decreased Interest 0 0 0  Down, Depressed, Hopeless 0 0 0  PHQ - 2 Score 0 0 0   MMSE - Mini Mental State Exam 07/25/2016 08/17/2014  Not completed: - Unable to complete  Orientation to time 5 -  Orientation to Place 5 -  Registration 3 -  Attention/ Calculation 5 -  Recall 3 -  Language- name 2 objects 2 -  Language- repeat 1 -  Language- follow 3 step command 3 -  Language- read & follow direction 1 -  Write a sentence 1 -  Copy design 1 -  Total score 30 -   PSA (yearly, >56yrs):needed.  Vision: up to date, last done 22month ago, treatment for glaucoma.  Dental: no insurance, has upper denture.  Advanced Directive: information was provided during  previous OV Advanced Directives 08/11/2016  Does Patient Have a Medical Advance Directive? No  Copy of Healthcare Power of Attorney in Chart? -  Would patient like information on creating a medical advance directive? No - Patient declined    Medications and allergies reviewed with patient and updated if appropriate.  Patient Active Problem List   Diagnosis Date Noted  . Hearing loss 09/09/2016  . Benign prostatic hyperplasia 08/28/2015  . Routine general medical examination at a health care facility 08/28/2015  . Allergic rhinitis 02/26/2015  . Myalgia 10/16/2014  . Constipation 04/26/2014  . Pain in joint, ankle and foot 02/14/2014  . Glaucoma 05/07/2013  . Essential hypertension, benign 05/07/2013  . Seizures ([place]) 03/17/2013  . Normocytic anemia 09/25/2012    Current Outpatient Prescriptions on File Prior to Visit  Medication Sig Dispense Refill  . aspirin 81 MG tablet Take 81 mg by mouth daily.    . calcium-vitamin D (OSCAL WITH D) 500-200 MG-UNIT tablet Take 1 tablet by mouth.    . cycloSPORINE (RESTASIS) 0.05 % ophthalmic emulsion Place 1 drop into both eyes 2 (two) times daily.    Marland Kitchen latanoprost (XALATAN) 0.005 % ophthalmic solution     . levETIRAcetam (KEPPRA) 500 MG tablet Take 1 tablet (500 mg total) by mouth 2 (two) times daily. 180 tablet 3  . Multiple Vitamin (MULTIVITAMIN) tablet Take 1 tablet by mouth daily.    Marland Kitchen  doxazosin (CARDURA) 4 MG tablet Take 1 tablet (4 mg total) by mouth daily. 90 tablet 1   No current facility-administered medications on file prior to visit.     Past Medical History:  Diagnosis Date  . Glaucoma   . Hypertension   . Normocytic anemia 09/25/2012  . Polyp, sigmoid colon 11/05/2011  . Seizure ([place])   . Seizures ([place]) 03/17/2013    Past Surgical History:  Procedure Laterality Date  . CATARACT EXTRACTION, BILATERAL    . GLAUCOMA REPAIR  10 yrs ago    Social History   Social History  . Marital status: Married    Spouse name: Terrence Dupont    . Number of children: 1  . Years of education: 67   Occupational History  . retired    Social History Main Topics  . Smoking status: Former Smoker    Packs/day: 1.00    Types: Cigarettes    Quit date: 02/18/2007  . Smokeless tobacco: Never Used     Comment: not sure but a while ago; quit smoking and ETOH  . Alcohol use No     Comment: quit in 2009  . Drug use: No  . Sexual activity: Not Asked   Other Topics Concern  . None   Social History Narrative   Patient lives at home with his wife Terrence Dupont). Patient is retired. Patient has 11 th grade education.    Caffeine- one cup of coffee daily and one soda.   Right handed.    Family History  Problem Relation Age of Onset  . Stroke Mother   . High blood pressure Mother   . Stroke Father   . High blood pressure Father   . Colon cancer Neg Hx         Review of Systems  Constitutional: Negative for fever, malaise/fatigue and weight loss.  HENT: Negative for congestion and sore throat.   Eyes:       Negative for visual changes  Respiratory: Negative for cough and shortness of breath.   Cardiovascular: Negative for chest pain, palpitations and leg swelling.  Gastrointestinal: Positive for constipation. Negative for abdominal pain, blood in stool, diarrhea, heartburn, melena and nausea.  Genitourinary: Negative for dysuria, frequency and urgency.  Musculoskeletal: Negative for falls, joint pain and myalgias.  Skin: Negative for rash.  Neurological: Negative for dizziness, sensory change, focal weakness, weakness and headaches.  Endo/Heme/Allergies: Does not bruise/bleed easily.  Psychiatric/Behavioral: Negative for depression, substance abuse and suicidal ideas. The patient is not nervous/anxious.     Objective:   Vitals:   09/09/16 0825  BP: 140/76  Pulse: (!) 57  Temp: 97.7 F (36.5 C)    Body mass index is 23.02 kg/m.   Physical Examination:  Physical Exam  Constitutional: He is oriented to person, place, and  time and well-developed, well-nourished, and in no distress. No distress.  HENT:  Right Ear: External ear normal.  Left Ear: External ear normal.  Nose: Nose normal.  Mouth/Throat: Oropharynx is clear and moist. No oropharyngeal exudate.  Eyes: Pupils are equal, round, and reactive to light. Conjunctivae and EOM are normal. No scleral icterus.  Neck: Normal range of motion. Neck supple. No thyromegaly present.  Cardiovascular: Normal rate, normal heart sounds and intact distal pulses.   Pulmonary/Chest: Effort normal and breath sounds normal. He exhibits no tenderness.  Abdominal: Soft. Bowel sounds are normal. He exhibits no distension. There is no tenderness.  Musculoskeletal: Normal range of motion. He exhibits no edema or tenderness.  Lymphadenopathy:  He has no cervical adenopathy.  Neurological: He is alert and oriented to person, place, and time. Gait normal.  Skin: Skin is warm and dry.  Psychiatric: Affect and judgment normal.  Vitals reviewed.   ASSESSMENT and PLAN:  Cailen was seen today for annual exam.  Diagnoses and all orders for this visit:  Encounter for preventative adult health care exam with abnormal findings -     Hepatic function panel; Future  Benign prostatic hyperplasia with urinary frequency -     Ambulatory referral to Urology -     PSA; Future  Encounter for lipid screening for cardiovascular disease -     Lipid panel; Future  Constipation, unspecified constipation type -     polyethylene glycol powder (GLYCOLAX/MIRALAX) powder; Take 17 g by mouth daily.  Essential hypertension, benign -     TSH; Future -     Lipid panel; Future -     Basic metabolic panel; Future -     lisinopril (PRINIVIL,ZESTRIL) 10 MG tablet; Take 1 tablet (10 mg total) by mouth daily.  Normocytic anemia -     CBC w/Diff; Future   No problem-specific Assessment & Plan notes found for this encounter.     Follow up: Return in about 6 months (around 03/12/2017) for  HTN.  Wilfred Lacy, NP

## 2016-09-09 NOTE — Patient Instructions (Addendum)
Return to lab tomorrow morning  For blood draw.  Community Occupational psychologist of Services Cost  A Matter of Balance Class locations vary. Call [place] on Aging for more information.  http://dawson-may.[email or web address]/ [phone] 8-Session program addressing the fear of falling and increasing activity levels of older adults Free to minimal cost  A.C.T. By [employer] [address] [place], [place] [address].  BetaBlues.dk [phone]  Personal training, gym, classes including Silver Sneakers* and ACTion for Aging Adults Fee-based  A.H.O.Y. (Add Health to [place]) Airs on Time [employer] 13, M-F at [place]: [employer],  [place] [facility] [place],  [place], [place] [facility], [address] [facility], [place], [place], [place] [address]  [place] Location: Sharrell Ku. [employer] [place]      [phone]  [phone]  [phone]  [phone]  [phone]  [phone]  [phone]  [phone]  [phone]  [phone]    [phone] A total-body conditioning class for adults 59 and older; designed to increase muscular strength, endurance, range of movement, flexibility, balance, agility and coordination Free  [facility] [place], [place] [address] [place]      [address]      [phone]      Pilate's class for individualsreturning to exercise after an injury, before or after surgery or for individuals with complex musculoskeletal issues; designed to improve strength, balance  , flexibility      $15/class  [place] [address] [place], [place] [address] www.[email or web address] [place] classes for beginners to advanced [place], [place] [address] [email or web address]-[email or web address].[email or web address] [place] Chair Exercises Free, ages 37 and older; Ages 38-59 fee based  Marvia Pickles, [facility] [address] [place], [place] [address] [email or web address]' .Beverlee Nims [phone]  A.H.O.Y. Tai Chi Fee-based Donation based or free  [place] Class locations vary.  Call or email Angela Burke or view website for more information. [email or web address] .[email or web address] GainPain.com.[email or web address] [phone] Ongoing classes at local YMCAs and gyms Fee-based  Silver Sneakers A.C.T. By [place] [employer]: [place] [phone]  [phone] [phone] Classes designed for older adults who want to improve their strength, flexibility, balance and endurance.   Silver sneakers is covered by some insurance plans and includes a fitness center membership at participating locations. Find out more by calling [phone] or visiting www.silversneakers.com Covered by some insurance plans  [facility] [phone] A.H.O.Y., fitness room, personal training, fitness classes for injury prevention, strength, balance, flexibility, water fitness classes Ages 55+: $68 for 6 months; Ages 25-54: $74 for  6 months  Tai Chi for [facility] [address] [place], [place] [address] [email or web address]'[email or web address] [place]  classes for beginners to advanced; geared for seniors Donation Based      [employer] ([place] Others Participate in Exercise     Loyal Gambler. Rosana Hoes, PhD, [place] [email or web address] [place]     [phone]     A comprehensive fitness program for adults.  The program paris senior-level undergraduates Kinesiology students with adults who desire to learn how to exercise safely.  Includes a structural exercise class focusing on functional fitnesss     $100/semester in fall and spring; $75 in summer (no trainers)    *Silver Sneakers is covered by some Personal assistant and includes a  Radio producer at participating locations.  Find out more by calling [phone] or visiting www.[email or web address]  For additional health and human services resources for senior adults, please contact SeniorLine at [phone] in [place] and [place] at [phone] in all other areas.

## 2016-09-10 ENCOUNTER — Other Ambulatory Visit (INDEPENDENT_AMBULATORY_CARE_PROVIDER_SITE_OTHER): Payer: Medicare Other

## 2016-09-10 DIAGNOSIS — R350 Frequency of micturition: Secondary | ICD-10-CM | POA: Diagnosis not present

## 2016-09-10 DIAGNOSIS — D649 Anemia, unspecified: Secondary | ICD-10-CM

## 2016-09-10 DIAGNOSIS — Z0001 Encounter for general adult medical examination with abnormal findings: Secondary | ICD-10-CM | POA: Diagnosis not present

## 2016-09-10 DIAGNOSIS — Z13220 Encounter for screening for lipoid disorders: Secondary | ICD-10-CM

## 2016-09-10 DIAGNOSIS — I10 Essential (primary) hypertension: Secondary | ICD-10-CM

## 2016-09-10 DIAGNOSIS — Z136 Encounter for screening for cardiovascular disorders: Secondary | ICD-10-CM | POA: Diagnosis not present

## 2016-09-10 DIAGNOSIS — N401 Enlarged prostate with lower urinary tract symptoms: Secondary | ICD-10-CM

## 2016-09-10 LAB — CBC WITH DIFFERENTIAL/PLATELET
Basophils Absolute: 0 10*3/uL (ref 0.0–0.1)
Basophils Relative: 0.6 % (ref 0.0–3.0)
Eosinophils Absolute: 0.1 10*3/uL (ref 0.0–0.7)
Eosinophils Relative: 3 % (ref 0.0–5.0)
HCT: 35.8 % — ABNORMAL LOW (ref 39.0–52.0)
Hemoglobin: 12 g/dL — ABNORMAL LOW (ref 13.0–17.0)
Lymphocytes Relative: 38.8 % (ref 12.0–46.0)
Lymphs Abs: 1.3 10*3/uL (ref 0.7–4.0)
MCHC: 33.5 g/dL (ref 30.0–36.0)
MCV: 83.1 fl (ref 78.0–100.0)
Monocytes Absolute: 0.5 10*3/uL (ref 0.1–1.0)
Monocytes Relative: 14.5 % — ABNORMAL HIGH (ref 3.0–12.0)
Neutro Abs: 1.4 10*3/uL (ref 1.4–7.7)
Neutrophils Relative %: 43.1 % (ref 43.0–77.0)
Platelets: 166 10*3/uL (ref 150.0–400.0)
RBC: 4.3 Mil/uL (ref 4.22–5.81)
RDW: 14 % (ref 11.5–15.5)
WBC: 3.3 10*3/uL — ABNORMAL LOW (ref 4.0–10.5)

## 2016-09-10 LAB — BASIC METABOLIC PANEL
BUN: 13 mg/dL (ref 6–23)
CO2: 32 mEq/L (ref 19–32)
Calcium: 9.2 mg/dL (ref 8.4–10.5)
Chloride: 104 mEq/L (ref 96–112)
Creatinine, Ser: 1.16 mg/dL (ref 0.40–1.50)
GFR: 78.91 mL/min (ref >60.00)
Glucose, Bld: 96 mg/dL (ref 70–99)
Potassium: 4.2 mEq/L (ref 3.5–5.1)
Sodium: 140 mEq/L (ref 135–145)

## 2016-09-10 LAB — LIPID PANEL
Cholesterol: 168 mg/dL (ref 0–200)
HDL: 59.6 mg/dL (ref >39.00)
LDL Cholesterol: 100 mg/dL — ABNORMAL HIGH (ref 0–99)
NonHDL: 108.78
Total CHOL/HDL Ratio: 3
Triglycerides: 43 mg/dL (ref 0.0–149.0)
VLDL: 8.6 mg/dL (ref 0.0–40.0)

## 2016-09-10 LAB — HEPATIC FUNCTION PANEL
ALT: 8 U/L (ref 0–53)
AST: 15 U/L (ref 0–37)
Albumin: 4 g/dL (ref 3.5–5.2)
Alkaline Phosphatase: 27 U/L — ABNORMAL LOW (ref 39–117)
Bilirubin, Direct: 0.1 mg/dL (ref 0.0–0.3)
Total Bilirubin: 0.6 mg/dL (ref 0.2–1.2)
Total Protein: 6.8 g/dL (ref 6.0–8.3)

## 2016-09-10 LAB — PSA: PSA: 4.35 ng/mL — ABNORMAL HIGH (ref 0.10–4.00)

## 2016-09-10 LAB — TSH: TSH: 1.09 u[IU]/mL (ref 0.35–4.50)

## 2016-10-02 ENCOUNTER — Other Ambulatory Visit: Payer: Self-pay | Admitting: Internal Medicine

## 2016-11-19 DIAGNOSIS — H401133 Primary open-angle glaucoma, bilateral, severe stage: Secondary | ICD-10-CM | POA: Diagnosis not present

## 2016-11-21 DIAGNOSIS — N401 Enlarged prostate with lower urinary tract symptoms: Secondary | ICD-10-CM | POA: Diagnosis not present

## 2016-12-03 ENCOUNTER — Encounter: Payer: Self-pay | Admitting: Internal Medicine

## 2016-12-03 ENCOUNTER — Telehealth: Payer: Self-pay | Admitting: *Deleted

## 2016-12-03 NOTE — Telephone Encounter (Signed)
LMVM for pt to call back to reschedule appt from 07-16-16 to another day.

## 2016-12-04 NOTE — Telephone Encounter (Signed)
Spoke to pt and rescheduled to 07-15-16 at 0815. He will get reminder call prior to his appts.  He verbalized understanding.

## 2016-12-09 ENCOUNTER — Encounter: Payer: Self-pay | Admitting: Internal Medicine

## 2017-01-05 ENCOUNTER — Ambulatory Visit (AMBULATORY_SURGERY_CENTER): Payer: Self-pay

## 2017-01-05 ENCOUNTER — Other Ambulatory Visit: Payer: Self-pay

## 2017-01-05 VITALS — Ht 67.0 in | Wt 149.0 lb

## 2017-01-05 DIAGNOSIS — Z86010 Personal history of colonic polyps: Secondary | ICD-10-CM

## 2017-01-05 MED ORDER — NA SULFATE-K SULFATE-MG SULF 17.5-3.13-1.6 GM/177ML PO SOLN: 1.0000 | Freq: Once | ORAL | 0 refills | Status: AC

## 2017-01-05 NOTE — Progress Notes (Signed)
Denies allergies to eggs or soy products. Denies complication of anesthesia or sedation. Denies use of weight loss medication. Denies use of O2.   Emmi instructions declined.  

## 2017-01-07 ENCOUNTER — Encounter: Payer: Self-pay | Admitting: Internal Medicine

## 2017-01-21 ENCOUNTER — Other Ambulatory Visit: Payer: Self-pay

## 2017-01-21 ENCOUNTER — Encounter: Payer: Self-pay | Admitting: Internal Medicine

## 2017-01-21 ENCOUNTER — Ambulatory Visit (AMBULATORY_SURGERY_CENTER): Payer: Medicare Other | Admitting: Internal Medicine

## 2017-01-21 VITALS — BP 136/70 | HR 55 | Temp 97.8°F | Resp 18 | Ht 67.0 in | Wt 149.0 lb

## 2017-01-21 DIAGNOSIS — Z86010 Personal history of colonic polyps: Secondary | ICD-10-CM

## 2017-01-21 DIAGNOSIS — Z1211 Encounter for screening for malignant neoplasm of colon: Secondary | ICD-10-CM | POA: Diagnosis not present

## 2017-01-21 MED ORDER — SODIUM CHLORIDE 0.9 % IV SOLN: 500.0000 mL | Freq: Once | INTRAVENOUS | Status: DC

## 2017-01-21 NOTE — Patient Instructions (Signed)
YOU HAD AN ENDOSCOPIC PROCEDURE TODAY AT [facility]:   Refer to the procedure report that was given to you for any specific questions about what was found during the examination.  If the procedure report does not answer your questions, please call your gastroenterologist to clarify.  If you requested that your care partner not be given the details of your procedure findings, then the procedure report has been included in a sealed envelope for you to review at your convenience later.  YOU SHOULD EXPECT: Some feelings of bloating in the abdomen. Passage of more gas than usual.  Walking can help get rid of the air that was put into your GI tract during the procedure and reduce the bloating. If you had a lower endoscopy (such as a colonoscopy or flexible sigmoidoscopy) you may notice spotting of blood in your stool or on the toilet paper. If you underwent a bowel prep for your procedure, you may not have a normal bowel movement for a few days.  Please Note:  You might notice some irritation and congestion in your nose or some drainage.  This is from the oxygen used during your procedure.  There is no need for concern and it should clear up in a day or so.  SYMPTOMS TO REPORT IMMEDIATELY:   Following lower endoscopy (colonoscopy or flexible sigmoidoscopy):  Excessive amounts of blood in the stool  Significant tenderness or worsening of abdominal pains  Swelling of the abdomen that is new, acute  Fever of 100F or higher  For urgent or emergent issues, a gastroenterologist can be reached at any hour by calling [phone].   DIET:  We do recommend a small meal at first, but then you may proceed to your regular diet.  Drink plenty of fluids but you should avoid alcoholic beverages for 24 hours.  ACTIVITY:  You should plan to take it easy for the rest of today and you should NOT DRIVE or use heavy machinery until tomorrow (because of the sedation medicines used during the test).     FOLLOW UP: Our staff will call the number listed on your records the next business day following your procedure to check on you and address any questions or concerns that you may have regarding the information given to you following your procedure. If we do not reach you, we will leave a message.  However, if you are feeling well and you are not experiencing any problems, there is no need to return our call.  We will assume that you have returned to your regular daily activities without incident.  If any biopsies were taken you will be contacted by phone or by letter within the next 1-3 weeks.  Please call us at [phone] if you have not heard about the biopsies in 3 weeks.   No recommended screening Colonoscopy due to age and no polyps on today's exam.  SIGNATURES/CONFIDENTIALITY: You and/or your care partner have signed paperwork which will be entered into your electronic medical record.  These signatures attest to the fact that that the information above on your After Visit Summary has been reviewed and is understood.  Full responsibility of the confidentiality of this discharge information lies with you and/or your care-partner.

## 2017-01-21 NOTE — Op Note (Signed)
[facility] Patient Name: Evan Escobar Procedure Date: 01/21/2017 1:32 PM MRN: [id number] Endoscopist: Jerene Bears , MD Age: 76 Referring MD:  Date of Birth: 1940/06/08 Gender: Male Account #: [id number] Procedure:                Colonoscopy Indications:              Surveillance: Personal history of adenomatous                            polyps on last colonoscopy 5 years ago Medicines:                Monitored Anesthesia Care Procedure:                Pre-Anesthesia Assessment:                           - Prior to the procedure, a History and Physical                            was performed, and patient medications and                            allergies were reviewed. The patient's tolerance of                            previous anesthesia was also reviewed. The risks                            and benefits of the procedure and the sedation                            options and risks were discussed with the patient.                            All questions were answered, and informed consent                            was obtained. Prior Anticoagulants: The patient has                            taken no previous anticoagulant or antiplatelet                            agents. ASA Grade Assessment: III - A patient with                            severe systemic disease. After reviewing the risks                            and benefits, the patient was deemed in                            satisfactory condition to undergo the procedure.  After obtaining informed consent, the colonoscope                            was passed under direct vision. Throughout the                            procedure, the patient's blood pressure, pulse, and                            oxygen saturations were monitored continuously. The                            Colonoscope was introduced through the anus and                            advanced to the the cecum,  identified by                            appendiceal orifice and ileocecal valve. The                            colonoscopy was performed without difficulty. The                            patient tolerated the procedure well. The quality                            of the bowel preparation was good. The ileocecal                            valve, appendiceal orifice, and rectum were                            photographed. Scope In: 1:44:34 PM Scope Out: 2:04:27 PM Scope Withdrawal Time: 0 hours 16 minutes 16 seconds  Total Procedure Duration: 0 hours 19 minutes 53 seconds  Findings:                 The perianal and digital rectal examinations were                            normal.                           The colon (entire examined portion) appeared normal.                           Hypertrophied anala papillae and no additional                            abnormalities were found on retroflexion. Complications:            No immediate complications. Estimated Blood Loss:     Estimated blood loss: none. Impression:               - The entire examined colon is normal.                           -  No specimens collected. Recommendation:           - Patient has a contact number available for                            emergencies. The signs and symptoms of potential                            delayed complications were discussed with the                            patient. Return to normal activities tomorrow.                            Written discharge instructions were provided to the                            patient.                           - Resume previous diet.                           - Continue present medications.                           - No recommendation at this time regarding repeat                            colonoscopy due to age and no polyps on today's                            exam. Jerene Bears, MD 01/21/2017 2:13:18 PM This report has been signed  electronically.

## 2017-01-21 NOTE — Progress Notes (Signed)
To PACU, VSS. Report to [email or web address] 

## 2017-01-22 ENCOUNTER — Telehealth: Payer: Self-pay

## 2017-01-22 NOTE — Telephone Encounter (Signed)
  Follow up Call-  Call back number 01/21/2017  Post procedure Call Back phone  # [phone]  Permission to leave phone message Yes  Some recent data might be hidden     Patient questions:  Do you have a fever, pain , or abdominal swelling? No. Pain Score  0 *  Have you tolerated food without any problems? Yes.    Have you been able to return to your normal activities? Yes.    Do you have any questions about your discharge instructions: Diet   No. Medications  No. Follow up visit  No.  Do you have questions or concerns about your Care? No.  Actions: * If pain score is 4 or above: No action needed, pain <4.

## 2017-01-22 NOTE — Telephone Encounter (Signed)
  Follow up Call-  Call back number 01/21/2017  Post procedure Call Back phone  # [phone]  Permission to leave phone message Yes  Some recent data might be hidden     Left message

## 2017-02-03 ENCOUNTER — Ambulatory Visit (INDEPENDENT_AMBULATORY_CARE_PROVIDER_SITE_OTHER): Payer: Medicare Other | Admitting: Internal Medicine

## 2017-02-03 ENCOUNTER — Encounter: Payer: Self-pay | Admitting: Internal Medicine

## 2017-02-03 VITALS — BP 120/90 | HR 55 | Temp 97.7°F | Ht 67.0 in | Wt 142.0 lb

## 2017-02-03 DIAGNOSIS — R200 Anesthesia of skin: Secondary | ICD-10-CM | POA: Insufficient documentation

## 2017-02-03 DIAGNOSIS — R29818 Other symptoms and signs involving the nervous system: Secondary | ICD-10-CM

## 2017-02-03 DIAGNOSIS — R202 Paresthesia of skin: Secondary | ICD-10-CM | POA: Diagnosis not present

## 2017-02-03 NOTE — Patient Instructions (Signed)
We will get the CT scan of the head and neck to check for nerve injury causing the problems in the arm.   Depending on the results we may need to send you to a specialist to help with the problem.
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
## 2017-02-03 NOTE — Assessment & Plan Note (Addendum)
Concerning given the recent injury and numbness in the arm and face. Could be some bleeding or nerve impingement or stroke from the injury. Outside the window for possible treatment if stroke given that symptoms are ongoing for 3-4 weeks. If negative I would advise he go to neurology for concern about seizure medication. Depending on results may need neurosurgery evaluation.

## 2017-02-05 ENCOUNTER — Ambulatory Visit (INDEPENDENT_AMBULATORY_CARE_PROVIDER_SITE_OTHER)
Admission: RE | Admit: 2017-02-05 | Discharge: 2017-02-05 | Disposition: A | Discharge status: Home / Self Care | Payer: Medicare Other | Source: Ambulatory Visit | Attending: Internal Medicine | Admitting: Internal Medicine

## 2017-02-05 DIAGNOSIS — S0990XA Unspecified injury of head, initial encounter: Secondary | ICD-10-CM | POA: Diagnosis not present

## 2017-02-05 DIAGNOSIS — R202 Paresthesia of skin: Secondary | ICD-10-CM | POA: Diagnosis not present

## 2017-02-05 DIAGNOSIS — S199XXA Unspecified injury of neck, initial encounter: Secondary | ICD-10-CM | POA: Diagnosis not present

## 2017-02-05 DIAGNOSIS — R29818 Other symptoms and signs involving the nervous system: Secondary | ICD-10-CM | POA: Diagnosis not present

## 2017-02-05 DIAGNOSIS — R200 Anesthesia of skin: Secondary | ICD-10-CM

## 2017-02-05 DIAGNOSIS — M542 Cervicalgia: Secondary | ICD-10-CM | POA: Diagnosis not present

## 2017-02-19 DIAGNOSIS — H401133 Primary open-angle glaucoma, bilateral, severe stage: Secondary | ICD-10-CM | POA: Diagnosis not present

## 2017-02-23 ENCOUNTER — Telehealth: Payer: Self-pay | Admitting: Internal Medicine

## 2017-02-23 DIAGNOSIS — M542 Cervicalgia: Secondary | ICD-10-CM

## 2017-02-23 NOTE — Telephone Encounter (Signed)
Patient's wife informed

## 2017-02-23 NOTE — Telephone Encounter (Signed)
Referral placed.

## 2017-02-23 NOTE — Telephone Encounter (Signed)
Received a call from pt and he still isn't feeling better since he had the CT scans and would like to go ahead with the referral to specialist

## 2017-02-26 NOTE — Telephone Encounter (Signed)
Pt does not have the information of where he needs to go for referral. Please call [phone]  He was not given any info from wife

## 2017-02-26 NOTE — Telephone Encounter (Signed)
Called patient and tod him he will be given all of that information once we call to set up his appt

## 2017-03-03 DIAGNOSIS — M4722 Other spondylosis with radiculopathy, cervical region: Secondary | ICD-10-CM | POA: Diagnosis not present

## 2017-03-03 DIAGNOSIS — I10 Essential (primary) hypertension: Secondary | ICD-10-CM | POA: Diagnosis not present

## 2017-03-03 DIAGNOSIS — M542 Cervicalgia: Secondary | ICD-10-CM | POA: Diagnosis not present

## 2017-03-03 DIAGNOSIS — M4312 Spondylolisthesis, cervical region: Secondary | ICD-10-CM | POA: Diagnosis not present

## 2017-03-12 ENCOUNTER — Ambulatory Visit: Payer: Medicare Other | Admitting: Internal Medicine

## 2017-03-18 DIAGNOSIS — M4802 Spinal stenosis, cervical region: Secondary | ICD-10-CM | POA: Diagnosis not present

## 2017-03-18 DIAGNOSIS — M4722 Other spondylosis with radiculopathy, cervical region: Secondary | ICD-10-CM | POA: Diagnosis not present

## 2017-04-02 ENCOUNTER — Other Ambulatory Visit: Payer: Self-pay | Admitting: Neurological Surgery

## 2017-04-02 DIAGNOSIS — M4712 Other spondylosis with myelopathy, cervical region: Secondary | ICD-10-CM | POA: Diagnosis not present

## 2017-04-21 ENCOUNTER — Inpatient Hospital Stay (HOSPITAL_COMMUNITY): Admission: RE | Admit: 2017-04-21 | Payer: Medicare Other | Source: Ambulatory Visit

## 2017-04-27 ENCOUNTER — Ambulatory Visit (HOSPITAL_COMMUNITY): Admission: RE | Admit: 2017-04-27 | Payer: Medicare Other | Source: Ambulatory Visit | Admitting: Neurological Surgery

## 2017-04-27 ENCOUNTER — Encounter (HOSPITAL_COMMUNITY): Admission: RE | Payer: Self-pay | Source: Ambulatory Visit

## 2017-04-27 SURGERY — ANTERIOR CERVICAL DECOMPRESSION/DISCECTOMY FUSION 2 LEVELS
Anesthesia: General

## 2017-04-30 ENCOUNTER — Other Ambulatory Visit: Payer: Self-pay | Admitting: Neurosurgery

## 2017-04-30 DIAGNOSIS — I10 Essential (primary) hypertension: Secondary | ICD-10-CM | POA: Diagnosis not present

## 2017-04-30 DIAGNOSIS — M4712 Other spondylosis with myelopathy, cervical region: Secondary | ICD-10-CM | POA: Diagnosis not present

## 2017-05-12 NOTE — Pre-Procedure Instructions (Signed)
Evan Escobar  05/12/2017      [facility] [address], [address] [place] [address] Phone: [phone] Fax: [phone]    Your procedure is scheduled on Fri. April 5  Report to [facility] Admitting at 7:45 A.M.  Call this number if you have problems the morning of surgery:  [phone]   Remember:  Do not eat food or drink liquids after midnight on Thurs, April 4   Take these medicines the morning of surgery with A SIP OF WATER : tylenol if needed, eye drops, levetiracetam (keppra),tamsulosin (flomax)               7 days prior to surgery STOP taking any Aspirin(unless otherwise instructed by your surgeon), Aleve, Naproxen, Ibuprofen, Motrin, Advil, Goody's, BC's, all herbal medications, fish oil, and all vitamins   Do not wear jewelry.  Do not wear lotions, powders, or perfumes, or deodorant.  Do not shave 48 hours prior to surgery.  Men may shave face and neck.  Do not bring valuables to the hospital.  [facility] is not responsible for any belongings or valuables.  Contacts, dentures or bridgework may not be worn into surgery.  Leave your suitcase in the car.  After surgery it may be brought to your room.  For patients admitted to the hospital, discharge time will be determined by your treatment team.  Patients discharged the day of surgery will not be allowed to drive home.    Special instructions:   [facility]- Preparing For Surgery  Before surgery, you can play an important role. Because skin is not sterile, your skin needs to be as free of germs as possible. You can reduce the number of germs on your skin by washing with CHG (chlorahexidine gluconate) Soap before surgery.  CHG is an antiseptic cleaner which kills germs and bonds with the skin to continue killing germs even after washing.  Please do not use if you have an allergy to CHG or antibacterial soaps. If  your skin becomes reddened/irritated stop using the CHG.  Do not shave (including legs and underarms) for at least 48 hours prior to first CHG shower. It is OK to shave your face.  Please follow these instructions carefully.   1. Shower the NIGHT BEFORE SURGERY and the MORNING OF SURGERY with CHG.   2. If you chose to wash your hair, wash your hair first as usual with your normal shampoo.  3. After you shampoo, rinse your hair and body thoroughly to remove the shampoo.  4. Use CHG as you would any other liquid soap. You can apply CHG directly to the skin and wash gently with a scrungie or a clean washcloth.   5. Apply the CHG Soap to your body ONLY FROM THE NECK DOWN.  Do not use on open wounds or open sores. Avoid contact with your eyes, ears, mouth and genitals (private parts). Wash Face and genitals (private parts)  with your normal soap.  6. Wash thoroughly, paying special attention to the area where your surgery will be performed.  7. Thoroughly rinse your body with warm water from the neck down.  8. DO NOT shower/wash with your normal soap after using and rinsing off the CHG Soap.  9. Pat yourself dry with a CLEAN TOWEL.  10. Wear CLEAN PAJAMAS to bed the night before surgery, wear comfortable clothes the morning  of surgery  11. Place CLEAN SHEETS on your bed the night of your first shower and DO NOT SLEEP WITH PETS.    Day of Surgery: Do not apply any deodorants/lotions. Please wear clean clothes to the hospital/surgery center.      Please read over the following fact sheets that you were given. Coughing and Deep Breathing, MRSA Information and Surgical Site Infection Prevention

## 2017-05-13 ENCOUNTER — Encounter (HOSPITAL_COMMUNITY): Payer: Self-pay

## 2017-05-13 ENCOUNTER — Encounter (HOSPITAL_COMMUNITY)
Admission: RE | Admit: 2017-05-13 | Discharge: 2017-05-13 | Disposition: A | Discharge status: Home / Self Care | Payer: Medicare Other | Source: Ambulatory Visit | Attending: Neurosurgery | Admitting: Neurosurgery

## 2017-05-13 DIAGNOSIS — M4712 Other spondylosis with myelopathy, cervical region: Secondary | ICD-10-CM | POA: Diagnosis not present

## 2017-05-13 DIAGNOSIS — Z01812 Encounter for preprocedural laboratory examination: Secondary | ICD-10-CM | POA: Diagnosis not present

## 2017-05-13 DIAGNOSIS — I10 Essential (primary) hypertension: Secondary | ICD-10-CM | POA: Diagnosis not present

## 2017-05-13 DIAGNOSIS — Z01810 Encounter for preprocedural cardiovascular examination: Secondary | ICD-10-CM | POA: Insufficient documentation

## 2017-05-13 HISTORY — DX: Unspecified osteoarthritis, unspecified site: M19.90

## 2017-05-13 LAB — CBC
HCT: 38.2 % — ABNORMAL LOW (ref 39.0–52.0)
Hemoglobin: 12 g/dL — ABNORMAL LOW (ref 13.0–17.0)
MCH: 26.7 pg (ref 26.0–34.0)
MCHC: 31.4 g/dL (ref 30.0–36.0)
MCV: 84.9 fL (ref 78.0–100.0)
Platelets: 151 10*3/uL (ref 150–400)
RBC: 4.5 MIL/uL (ref 4.22–5.81)
RDW: 13.9 % (ref 11.5–15.5)
WBC: 4.2 10*3/uL (ref 4.0–10.5)

## 2017-05-13 LAB — BASIC METABOLIC PANEL
Anion gap: 7 (ref 5–15)
BUN: 20 mg/dL (ref 6–20)
CO2: 26 mmol/L (ref 22–32)
Calcium: 9.2 mg/dL (ref 8.9–10.3)
Chloride: 106 mmol/L (ref 101–111)
Creatinine, Ser: 1.18 mg/dL (ref 0.61–1.24)
GFR calc Af Amer: >60 mL/min (ref >60)
GFR calc non Af Amer: 59 mL/min — ABNORMAL LOW (ref >60)
Glucose, Bld: 103 mg/dL — ABNORMAL HIGH (ref 65–99)
Potassium: 4.2 mmol/L (ref 3.5–5.1)
Sodium: 139 mmol/L (ref 135–145)

## 2017-05-13 LAB — SURGICAL PCR SCREEN
MRSA, PCR: NEGATIVE
Staphylococcus aureus: NEGATIVE

## 2017-05-13 MED ORDER — CHLORHEXIDINE GLUCONATE CLOTH 2 % EX PADS: 6.0000 | MEDICATED_PAD | Freq: Once | CUTANEOUS | Status: DC

## 2017-05-22 ENCOUNTER — Inpatient Hospital Stay (HOSPITAL_COMMUNITY): Payer: Medicare Other | Admitting: Certified Registered Nurse Anesthetist

## 2017-05-22 ENCOUNTER — Inpatient Hospital Stay (HOSPITAL_COMMUNITY)
Admission: RE | Admit: 2017-05-22 | Discharge: 2017-05-23 | DRG: 472 | Disposition: A | Discharge status: Home / Self Care | Payer: Medicare Other | Source: Ambulatory Visit | Attending: Neurosurgery | Admitting: Neurosurgery

## 2017-05-22 ENCOUNTER — Encounter (HOSPITAL_COMMUNITY): Payer: Self-pay | Admitting: General Practice

## 2017-05-22 ENCOUNTER — Inpatient Hospital Stay (HOSPITAL_COMMUNITY)
Admission: RE | Disposition: A | Discharge status: Home / Self Care | Payer: Self-pay | Source: Ambulatory Visit | Attending: Neurosurgery

## 2017-05-22 ENCOUNTER — Other Ambulatory Visit: Payer: Self-pay

## 2017-05-22 ENCOUNTER — Inpatient Hospital Stay (HOSPITAL_COMMUNITY): Payer: Medicare Other

## 2017-05-22 DIAGNOSIS — M542 Cervicalgia: Secondary | ICD-10-CM | POA: Diagnosis present

## 2017-05-22 DIAGNOSIS — Z87891 Personal history of nicotine dependence: Secondary | ICD-10-CM | POA: Diagnosis not present

## 2017-05-22 DIAGNOSIS — Z7982 Long term (current) use of aspirin: Secondary | ICD-10-CM | POA: Diagnosis not present

## 2017-05-22 DIAGNOSIS — M4712 Other spondylosis with myelopathy, cervical region: Secondary | ICD-10-CM | POA: Diagnosis present

## 2017-05-22 DIAGNOSIS — G992 Myelopathy in diseases classified elsewhere: Secondary | ICD-10-CM | POA: Diagnosis present

## 2017-05-22 DIAGNOSIS — M1990 Unspecified osteoarthritis, unspecified site: Secondary | ICD-10-CM | POA: Diagnosis present

## 2017-05-22 DIAGNOSIS — Z79899 Other long term (current) drug therapy: Secondary | ICD-10-CM | POA: Diagnosis not present

## 2017-05-22 DIAGNOSIS — M4322 Fusion of spine, cervical region: Secondary | ICD-10-CM | POA: Diagnosis not present

## 2017-05-22 DIAGNOSIS — Z419 Encounter for procedure for purposes other than remedying health state, unspecified: Secondary | ICD-10-CM

## 2017-05-22 DIAGNOSIS — I10 Essential (primary) hypertension: Secondary | ICD-10-CM | POA: Diagnosis not present

## 2017-05-22 DIAGNOSIS — Z9841 Cataract extraction status, right eye: Secondary | ICD-10-CM | POA: Diagnosis not present

## 2017-05-22 DIAGNOSIS — Z8249 Family history of ischemic heart disease and other diseases of the circulatory system: Secondary | ICD-10-CM

## 2017-05-22 DIAGNOSIS — M4802 Spinal stenosis, cervical region: Secondary | ICD-10-CM | POA: Diagnosis not present

## 2017-05-22 DIAGNOSIS — Z800 Family history of malignant neoplasm of digestive organs: Secondary | ICD-10-CM | POA: Diagnosis not present

## 2017-05-22 DIAGNOSIS — Z9842 Cataract extraction status, left eye: Secondary | ICD-10-CM

## 2017-05-22 DIAGNOSIS — G959 Disease of spinal cord, unspecified: Secondary | ICD-10-CM | POA: Diagnosis present

## 2017-05-22 DIAGNOSIS — R569 Unspecified convulsions: Secondary | ICD-10-CM | POA: Diagnosis present

## 2017-05-22 HISTORY — PX: ANTERIOR CERVICAL DECOMP/DISCECTOMY FUSION: SHX1161

## 2017-05-22 SURGERY — ANTERIOR CERVICAL DECOMPRESSION/DISCECTOMY FUSION 2 LEVELS
Anesthesia: General

## 2017-05-22 MED ORDER — PROPOFOL 10 MG/ML IV BOLUS
INTRAVENOUS | Status: DC | PRN
  Administered 2017-05-22: 120 mg via INTRAVENOUS

## 2017-05-22 MED ORDER — ACETAMINOPHEN 325 MG PO TABS: 650.0000 mg | ORAL_TABLET | ORAL | Status: DC | PRN

## 2017-05-22 MED ORDER — TAMSULOSIN HCL 0.4 MG PO CAPS: 0.4000 mg | ORAL_CAPSULE | Freq: Every day | ORAL | Status: DC

## 2017-05-22 MED ORDER — OXYCODONE HCL 5 MG/5ML PO SOLN: 5.0000 mg | Freq: Once | ORAL | Status: DC | PRN

## 2017-05-22 MED ORDER — FENTANYL CITRATE (PF) 100 MCG/2ML IJ SOLN
INTRAMUSCULAR | Status: AC
  Filled 2017-05-22: qty 2

## 2017-05-22 MED ORDER — ROCURONIUM BROMIDE 10 MG/ML (PF) SYRINGE
PREFILLED_SYRINGE | INTRAVENOUS | Status: DC | PRN
  Administered 2017-05-22: 10 mg via INTRAVENOUS
  Administered 2017-05-22: 60 mg via INTRAVENOUS

## 2017-05-22 MED ORDER — THROMBIN 5000 UNITS EX SOLR
CUTANEOUS | Status: DC | PRN
  Administered 2017-05-22: 10000 [IU] via TOPICAL

## 2017-05-22 MED ORDER — MIDAZOLAM HCL 5 MG/5ML IJ SOLN
INTRAMUSCULAR | Status: DC | PRN
  Administered 2017-05-22: 1 mg via INTRAVENOUS

## 2017-05-22 MED ORDER — FENTANYL CITRATE (PF) 250 MCG/5ML IJ SOLN
INTRAMUSCULAR | Status: DC | PRN
  Administered 2017-05-22: 25 ug via INTRAVENOUS
  Administered 2017-05-22: 50 ug via INTRAVENOUS
  Administered 2017-05-22: 100 ug via INTRAVENOUS
  Administered 2017-05-22: 25 ug via INTRAVENOUS

## 2017-05-22 MED ORDER — CEFAZOLIN SODIUM-DEXTROSE 2-4 GM/100ML-% IV SOLN
2.0000 g | Freq: Three times a day (TID) | INTRAVENOUS | Status: AC
  Administered 2017-05-22 – 2017-05-23 (×2): 2 g via INTRAVENOUS
  Filled 2017-05-22 (×2): qty 100

## 2017-05-22 MED ORDER — ALUM & MAG HYDROXIDE-SIMETH 200-200-20 MG/5ML PO SUSP: 30.0000 mL | Freq: Four times a day (QID) | ORAL | Status: DC | PRN

## 2017-05-22 MED ORDER — CYCLOBENZAPRINE HCL 10 MG PO TABS
10.0000 mg | ORAL_TABLET | Freq: Three times a day (TID) | ORAL | Status: DC | PRN
  Administered 2017-05-22: 10 mg via ORAL
  Filled 2017-05-22: qty 1

## 2017-05-22 MED ORDER — ACETAMINOPHEN 500 MG PO TABS: 500.0000 mg | ORAL_TABLET | Freq: Four times a day (QID) | ORAL | Status: DC | PRN

## 2017-05-22 MED ORDER — PHENYLEPHRINE HCL 10 MG/ML IJ SOLN
INTRAVENOUS | Status: DC | PRN
  Administered 2017-05-22: 40 ug/min via INTRAVENOUS

## 2017-05-22 MED ORDER — ONDANSETRON HCL 4 MG/2ML IJ SOLN: 4.0000 mg | Freq: Four times a day (QID) | INTRAMUSCULAR | Status: DC | PRN

## 2017-05-22 MED ORDER — ONDANSETRON HCL 4 MG PO TABS: 4.0000 mg | ORAL_TABLET | Freq: Four times a day (QID) | ORAL | Status: DC | PRN

## 2017-05-22 MED ORDER — CYCLOSPORINE 0.05 % OP EMUL
1.0000 [drp] | Freq: Two times a day (BID) | OPHTHALMIC | Status: DC
  Filled 2017-05-22 (×2): qty 1

## 2017-05-22 MED ORDER — PANTOPRAZOLE SODIUM 40 MG PO TBEC
40.0000 mg | DELAYED_RELEASE_TABLET | Freq: Every day | ORAL | Status: DC
  Administered 2017-05-22: 40 mg via ORAL
  Filled 2017-05-22: qty 1

## 2017-05-22 MED ORDER — PHENOL 1.4 % MT LIQD: 1.0000 | OROMUCOSAL | Status: DC | PRN

## 2017-05-22 MED ORDER — PANTOPRAZOLE SODIUM 40 MG IV SOLR: 40.0000 mg | Freq: Every day | INTRAVENOUS | Status: DC

## 2017-05-22 MED ORDER — LEVETIRACETAM 500 MG PO TABS
500.0000 mg | ORAL_TABLET | Freq: Two times a day (BID) | ORAL | Status: DC
  Administered 2017-05-22: 500 mg via ORAL
  Filled 2017-05-22 (×3): qty 1

## 2017-05-22 MED ORDER — PROPOFOL 10 MG/ML IV BOLUS
INTRAVENOUS | Status: AC
  Filled 2017-05-22: qty 20

## 2017-05-22 MED ORDER — SUCCINYLCHOLINE CHLORIDE 200 MG/10ML IV SOSY
PREFILLED_SYRINGE | INTRAVENOUS | Status: AC
  Filled 2017-05-22: qty 10

## 2017-05-22 MED ORDER — ACETAMINOPHEN 650 MG RE SUPP: 650.0000 mg | RECTAL | Status: DC | PRN

## 2017-05-22 MED ORDER — OXYCODONE HCL 5 MG PO TABS: 5.0000 mg | ORAL_TABLET | Freq: Once | ORAL | Status: DC | PRN

## 2017-05-22 MED ORDER — LIDOCAINE HCL (CARDIAC) 20 MG/ML IV SOLN
INTRAVENOUS | Status: AC
  Filled 2017-05-22: qty 5

## 2017-05-22 MED ORDER — LACTATED RINGERS IV SOLN
INTRAVENOUS | Status: DC
  Administered 2017-05-22: 09:00:00 via INTRAVENOUS

## 2017-05-22 MED ORDER — PHENYLEPHRINE 40 MCG/ML (10ML) SYRINGE FOR IV PUSH (FOR BLOOD PRESSURE SUPPORT)
PREFILLED_SYRINGE | INTRAVENOUS | Status: DC | PRN
  Administered 2017-05-22 (×4): 80 ug via INTRAVENOUS

## 2017-05-22 MED ORDER — 0.9 % SODIUM CHLORIDE (POUR BTL) OPTIME
TOPICAL | Status: DC | PRN
  Administered 2017-05-22: 1000 mL

## 2017-05-22 MED ORDER — THROMBIN 5000 UNITS EX SOLR
CUTANEOUS | Status: AC
  Filled 2017-05-22: qty 15000

## 2017-05-22 MED ORDER — DEXAMETHASONE SODIUM PHOSPHATE 10 MG/ML IJ SOLN
10.0000 mg | INTRAMUSCULAR | Status: AC
  Administered 2017-05-22: 10 mg via INTRAVENOUS
  Filled 2017-05-22: qty 1

## 2017-05-22 MED ORDER — ONDANSETRON HCL 4 MG/2ML IJ SOLN
INTRAMUSCULAR | Status: AC
  Filled 2017-05-22: qty 2

## 2017-05-22 MED ORDER — CEFAZOLIN SODIUM-DEXTROSE 2-4 GM/100ML-% IV SOLN
2.0000 g | INTRAVENOUS | Status: AC
  Administered 2017-05-22: 2 g via INTRAVENOUS

## 2017-05-22 MED ORDER — HYDROMORPHONE HCL 1 MG/ML IJ SOLN: 0.5000 mg | INTRAMUSCULAR | Status: DC | PRN

## 2017-05-22 MED ORDER — MIDAZOLAM HCL 2 MG/2ML IJ SOLN
INTRAMUSCULAR | Status: AC
  Filled 2017-05-22: qty 2

## 2017-05-22 MED ORDER — LIDOCAINE 2% (20 MG/ML) 5 ML SYRINGE
INTRAMUSCULAR | Status: DC | PRN
  Administered 2017-05-22: 100 mg via INTRAVENOUS

## 2017-05-22 MED ORDER — SODIUM CHLORIDE 0.9 % IV SOLN: 250.0000 mL | INTRAVENOUS | Status: DC

## 2017-05-22 MED ORDER — LISINOPRIL 10 MG PO TABS
10.0000 mg | ORAL_TABLET | Freq: Every day | ORAL | Status: DC
  Administered 2017-05-22: 10 mg via ORAL
  Filled 2017-05-22: qty 1

## 2017-05-22 MED ORDER — MENTHOL 3 MG MT LOZG: 1.0000 | LOZENGE | OROMUCOSAL | Status: DC | PRN

## 2017-05-22 MED ORDER — OXYCODONE HCL 5 MG PO TABS
10.0000 mg | ORAL_TABLET | ORAL | Status: DC | PRN
  Administered 2017-05-22 – 2017-05-23 (×4): 10 mg via ORAL
  Filled 2017-05-22 (×4): qty 2

## 2017-05-22 MED ORDER — SUGAMMADEX SODIUM 500 MG/5ML IV SOLN
INTRAVENOUS | Status: AC
Start: 2017-05-22 — End: 2017-05-22
  Filled 2017-05-22: qty 5

## 2017-05-22 MED ORDER — MAGNESIUM HYDROXIDE 400 MG/5ML PO SUSP: 30.0000 mL | Freq: Every day | ORAL | Status: DC | PRN

## 2017-05-22 MED ORDER — SODIUM CHLORIDE 0.9 % IV SOLN
INTRAVENOUS | Status: DC | PRN
  Administered 2017-05-22: 11:00:00

## 2017-05-22 MED ORDER — LABETALOL HCL 5 MG/ML IV SOLN
10.0000 mg | INTRAVENOUS | Status: DC | PRN
  Administered 2017-05-22: 10 mg via INTRAVENOUS

## 2017-05-22 MED ORDER — SODIUM CHLORIDE 0.9% FLUSH: 3.0000 mL | INTRAVENOUS | Status: DC | PRN

## 2017-05-22 MED ORDER — ADULT MULTIVITAMIN W/MINERALS CH
1.0000 | ORAL_TABLET | Freq: Every day | ORAL | Status: DC
  Administered 2017-05-22: 1 via ORAL
  Filled 2017-05-22: qty 1

## 2017-05-22 MED ORDER — SUGAMMADEX SODIUM 200 MG/2ML IV SOLN
INTRAVENOUS | Status: DC | PRN
  Administered 2017-05-22: 135.2 mg via INTRAVENOUS

## 2017-05-22 MED ORDER — FENTANYL CITRATE (PF) 250 MCG/5ML IJ SOLN
INTRAMUSCULAR | Status: AC
  Filled 2017-05-22: qty 5

## 2017-05-22 MED ORDER — LABETALOL HCL 5 MG/ML IV SOLN
INTRAVENOUS | Status: AC
  Filled 2017-05-22: qty 4

## 2017-05-22 MED ORDER — FENTANYL CITRATE (PF) 100 MCG/2ML IJ SOLN
25.0000 ug | INTRAMUSCULAR | Status: DC | PRN
  Administered 2017-05-22 (×3): 25 ug via INTRAVENOUS

## 2017-05-22 MED ORDER — ONDANSETRON HCL 4 MG/2ML IJ SOLN: 4.0000 mg | Freq: Once | INTRAMUSCULAR | Status: DC | PRN

## 2017-05-22 MED ORDER — LATANOPROST 0.005 % OP SOLN
1.0000 [drp] | Freq: Every day | OPHTHALMIC | Status: DC
  Administered 2017-05-22: 1 [drp] via OPHTHALMIC
  Filled 2017-05-22: qty 2.5

## 2017-05-22 MED ORDER — SODIUM CHLORIDE 0.9% FLUSH
3.0000 mL | Freq: Two times a day (BID) | INTRAVENOUS | Status: DC
Start: 2017-05-22 — End: 2017-05-22

## 2017-05-22 MED ORDER — DEXAMETHASONE SODIUM PHOSPHATE 10 MG/ML IJ SOLN
INTRAMUSCULAR | Status: AC
  Filled 2017-05-22: qty 1

## 2017-05-22 MED ORDER — ROCURONIUM BROMIDE 10 MG/ML (PF) SYRINGE
PREFILLED_SYRINGE | INTRAVENOUS | Status: AC
  Filled 2017-05-22: qty 5

## 2017-05-22 MED ORDER — ASPIRIN EC 81 MG PO TBEC: 81.0000 mg | DELAYED_RELEASE_TABLET | Freq: Every day | ORAL | Status: DC

## 2017-05-22 MED ORDER — ONDANSETRON HCL 4 MG/2ML IJ SOLN
INTRAMUSCULAR | Status: DC | PRN
  Administered 2017-05-22: 4 mg via INTRAVENOUS

## 2017-05-22 MED ORDER — GELATIN ABSORBABLE MT POWD
OROMUCOSAL | Status: DC | PRN
  Administered 2017-05-22: 11:00:00 via TOPICAL

## 2017-05-22 MED ORDER — HEMOSTATIC AGENTS (NO CHARGE) OPTIME
TOPICAL | Status: DC | PRN
  Administered 2017-05-22: 1 via TOPICAL

## 2017-05-22 SURGICAL SUPPLY — 63 items
ADH SKN CLS APL DERMABOND .7 (GAUZE/BANDAGES/DRESSINGS)
APL SKNCLS STERI-STRIP NONHPOA (GAUZE/BANDAGES/DRESSINGS) ×1
BAG DECANTER FOR FLEXI CONT (MISCELLANEOUS) ×3 IMPLANT
BENZOIN TINCTURE PRP APPL 2/3 (GAUZE/BANDAGES/DRESSINGS) ×3 IMPLANT
BIT DRILL SPINE QC 14 (BIT) ×2 IMPLANT
BONE VIVIGEN FORMABLE 1.3CC (Bone Implant) ×6 IMPLANT
BUR MATCHSTICK NEURO 3.0 LAGG (BURR) ×3 IMPLANT
CANISTER SUCT 3000ML PPV (MISCELLANEOUS) ×3 IMPLANT
CARTRIDGE OIL MAESTRO DRILL (MISCELLANEOUS) ×1 IMPLANT
CLOSURE WOUND 1/2 X4 (GAUZE/BANDAGES/DRESSINGS) ×1
DERMABOND ADVANCED (GAUZE/BANDAGES/DRESSINGS)
DERMABOND ADVANCED .7 DNX12 (GAUZE/BANDAGES/DRESSINGS) IMPLANT
DIFFUSER DRILL AIR PNEUMATIC (MISCELLANEOUS) ×3 IMPLANT
DRAPE C-ARM 42X72 X-RAY (DRAPES) ×6 IMPLANT
DRAPE LAPAROTOMY 100X72 PEDS (DRAPES) ×3 IMPLANT
DRAPE MICROSCOPE LEICA (MISCELLANEOUS) ×3 IMPLANT
DRSG OPSITE POSTOP 4X6 (GAUZE/BANDAGES/DRESSINGS) ×2 IMPLANT
DURAPREP 6ML APPLICATOR 50/CS (WOUND CARE) ×3 IMPLANT
ELECT COATED BLADE 2.86 ST (ELECTRODE) ×3 IMPLANT
ELECT REM PT RETURN 9FT ADLT (ELECTROSURGICAL) ×3
ELECTRODE REM PT RTRN 9FT ADLT (ELECTROSURGICAL) ×1 IMPLANT
GAUZE SPONGE 4X4 12PLY STRL (GAUZE/BANDAGES/DRESSINGS) ×3 IMPLANT
GAUZE SPONGE 4X4 16PLY XRAY LF (GAUZE/BANDAGES/DRESSINGS) IMPLANT
GLOVE BIO SURGEON STRL SZ7 (GLOVE) IMPLANT
GLOVE BIO SURGEON STRL SZ8 (GLOVE) ×3 IMPLANT
GLOVE BIOGEL PI IND STRL 7.0 (GLOVE) IMPLANT
GLOVE BIOGEL PI INDICATOR 7.0 (GLOVE) ×2
GLOVE EXAM NITRILE LRG STRL (GLOVE) IMPLANT
GLOVE EXAM NITRILE XL STR (GLOVE) IMPLANT
GLOVE EXAM NITRILE XS STR PU (GLOVE) IMPLANT
GLOVE INDICATOR 8.5 STRL (GLOVE) ×3 IMPLANT
GOWN STRL REUS W/ TWL LRG LVL3 (GOWN DISPOSABLE) IMPLANT
GOWN STRL REUS W/ TWL XL LVL3 (GOWN DISPOSABLE) ×1 IMPLANT
GOWN STRL REUS W/TWL 2XL LVL3 (GOWN DISPOSABLE) IMPLANT
GOWN STRL REUS W/TWL LRG LVL3 (GOWN DISPOSABLE)
GOWN STRL REUS W/TWL XL LVL3 (GOWN DISPOSABLE) ×3
GRAFT BNE MATRIX VG FRMBL SM 1 (Bone Implant) IMPLANT
HALTER HD/CHIN CERV TRACTION D (MISCELLANEOUS) ×3 IMPLANT
HEMOSTAT POWDER KIT SURGIFOAM (HEMOSTASIS) ×3 IMPLANT
KIT BASIN OR (CUSTOM PROCEDURE TRAY) ×3 IMPLANT
KIT TURNOVER KIT B (KITS) ×3 IMPLANT
NDL SPNL 20GX3.5 QUINCKE YW (NEEDLE) ×1 IMPLANT
NEEDLE SPNL 20GX3.5 QUINCKE YW (NEEDLE) ×3 IMPLANT
NS IRRIG 1000ML POUR BTL (IV SOLUTION) ×3 IMPLANT
OIL CARTRIDGE MAESTRO DRILL (MISCELLANEOUS) ×3
PACK LAMINECTOMY NEURO (CUSTOM PROCEDURE TRAY) ×3 IMPLANT
PAD ARMBOARD 7.5X6 YLW CONV (MISCELLANEOUS) ×9 IMPLANT
PIN DISTRACTION 14MM (PIN) IMPLANT
PLATE ANT CERV XTEND 2 LV 32 (Plate) ×2 IMPLANT
RUBBERBAND STERILE (MISCELLANEOUS) ×6 IMPLANT
SCREW XTD VAR 4.2 SELF TAP (Screw) ×12 IMPLANT
SPACER ACDF TPS LG PARALLEL 7 (Spacer) ×2 IMPLANT
SPACER COLONIAL ACDF TPS 8 7D (Spacer) ×2 IMPLANT
SPONGE INTESTINAL PEANUT (DISPOSABLE) ×3 IMPLANT
SPONGE SURGIFOAM ABS GEL SZ50 (HEMOSTASIS) ×3 IMPLANT
STRIP CLOSURE SKIN 1/2X4 (GAUZE/BANDAGES/DRESSINGS) ×2 IMPLANT
SUT VIC AB 3-0 SH 8-18 (SUTURE) ×3 IMPLANT
SUT VICRYL 4-0 PS2 18IN ABS (SUTURE) ×3 IMPLANT
TAPE CLOTH 4X10 WHT NS (GAUZE/BANDAGES/DRESSINGS) IMPLANT
TOWEL GREEN STERILE (TOWEL DISPOSABLE) ×3 IMPLANT
TOWEL GREEN STERILE FF (TOWEL DISPOSABLE) ×3 IMPLANT
TRAP SPECIMEN MUCOUS 40CC (MISCELLANEOUS) ×3 IMPLANT
WATER STERILE IRR 1000ML POUR (IV SOLUTION) ×3 IMPLANT

## 2017-05-22 NOTE — Evaluation (Signed)
Physical Therapy Evaluation Patient Details Name: Evan Escobar MRN: [id number] DOB: April 17, 1940 Today's Date: 05/22/2017   History of Present Illness  pt is a 76 y/o male with pmh significant for HTN, glaucoma, seizures, admitted with long-standing neck and left shoulder pain, numbness and weakness.  Workup revealed severe cervical stenosis.  Pt s/p C3-4 and C4-5 ACDF with autograft.  Clinical Impression  Pt admitted with/for elective cervical fusion.  Pt currently limited functionally due to the problems listed below.  (see problems list.)  Pt will benefit from PT to maximize function and safety to be able to get home safely with available assist .     Follow Up Recommendations No PT follow up;Supervision - Intermittent    Equipment Recommendations  None recommended by PT    Recommendations for Other Services       Precautions / Restrictions Precautions Precautions: Cervical Required Braces or Orthoses: Cervical Brace Cervical Brace: Soft collar;At all times Restrictions Weight Bearing Restrictions: No      Mobility  Bed Mobility Overal bed mobility: Needs Assistance Bed Mobility: Rolling;Sidelying to Sit;Sit to Sidelying Rolling: Supervision Sidelying to sit: Supervision     Sit to sidelying: Supervision General bed mobility comments: Practiced safe technique to improve safety  Transfers Overall transfer level: Needs assistance   Transfers: Sit to/from Stand Sit to Stand: Supervision            Ambulation/Gait Ambulation/Gait assistance: Supervision Ambulation Distance (Feet): 140 Feet Assistive device: None Gait Pattern/deviations: Step-through pattern   Gait velocity interpretation: Below normal speed for age/gender General Gait Details: mildly antalgic ( or gimpy) on the left.  generally steady overall, but slow  Stairs            Wheelchair Mobility    Modified Rankin (Stroke Patients Only)       Balance Overall balance assessment: Mild  deficits observed, not formally tested                                           Pertinent Vitals/Pain Pain Assessment: Faces Faces Pain Scale: Hurts little more Pain Location: L shoulder Pain Descriptors / Indicators: Sore Pain Intervention(s): Monitored during session    Home Living Family/patient expects to be discharged to:: Private residence Living Arrangements: Spouse/significant other Available Help at Discharge: Family;Available 24 hours/day Type of Home: House Home Access: Stairs to enter Entrance Stairs-Rails: Left;Right;None Technical brewer of Steps: 2 Home Layout: One level        Prior Function Level of Independence: Independent               Hand Dominance        Extremity/Trunk Assessment        Lower Extremity Assessment Lower Extremity Assessment: Overall WFL for tasks assessed(general proximal weakness left > right)       Communication   Communication: No difficulties  Cognition Arousal/Alertness: Awake/alert Behavior During Therapy: WFL for tasks assessed/performed Overall Cognitive Status: Within Functional Limits for tasks assessed                                        General Comments General comments (skin integrity, edema, etc.): Instructed in all cervical care and precautions, incl lifting restrictions and progression of activity    Exercises     Assessment/Plan  PT Assessment Patient needs continued PT services  PT Problem List Decreased strength;Decreased activity tolerance;Decreased mobility;Decreased coordination;Decreased knowledge of use of DME;Decreased knowledge of precautions;Pain       PT Treatment Interventions Gait training;Stair training;Functional mobility training;Therapeutic activities;Patient/family education    PT Goals (Current goals can be found in the Care Plan section)  Acute Rehab PT Goals Patient Stated Goal: independent PT Goal Formulation: With  patient Time For Goal Achievement: 05/29/17 Potential to Achieve Goals: Good    Frequency Min 5X/week   Barriers to discharge        Co-evaluation               AM-PAC PT "6 Clicks" Daily Activity  Outcome Measure Difficulty turning over in bed (including adjusting bedclothes, sheets and blankets)?: A Little Difficulty moving from lying on back to sitting on the side of the bed? : A Little Difficulty sitting down on and standing up from a chair with arms (e.g., wheelchair, bedside commode, etc,.)?: A Little Help needed moving to and from a bed to chair (including a wheelchair)?: A Little Help needed walking in hospital room?: A Little Help needed climbing 3-5 steps with a railing? : A Little 6 Click Score: 18    End of Session Equipment Utilized During Treatment: Cervical collar Activity Tolerance: Patient tolerated treatment well Patient left: in bed;with call bell/phone within reach Nurse Communication: Mobility status PT Visit Diagnosis: Unsteadiness on feet (R26.81);Other symptoms and signs involving the nervous system (R29.898);Pain Pain - Right/Left: Left Pain - part of body: Shoulder;Arm    Time: 2025-4270 PT Time Calculation (min) (ACUTE ONLY): 20 min   Charges:   PT Evaluation $PT Eval Low Complexity: 1 Low     PT G Codes:        06/09/17  Donnella Sham, PT [phone]  (pager)  Evan Escobar 06/09/2017, 5:34 PM

## 2017-05-22 NOTE — Anesthesia Preprocedure Evaluation (Signed)
Anesthesia Evaluation  Patient identified by MRN, date of birth, ID band Patient awake    Reviewed: Allergy & Precautions, NPO status , Patient's Chart, lab work & pertinent test results  Airway Mallampati: II  TM Distance: >3 FB Neck ROM: Full    Dental no notable dental hx.    Pulmonary neg pulmonary ROS, former smoker,    Pulmonary exam normal breath sounds clear to auscultation       Cardiovascular hypertension, Pt. on medications Normal cardiovascular exam Rhythm:Regular Rate:Normal     Neuro/Psych Seizures -, Well Controlled,  negative psych ROS   GI/Hepatic negative GI ROS, Neg liver ROS,   Endo/Other  negative endocrine ROS  Renal/GU negative Renal ROS  negative genitourinary   Musculoskeletal negative musculoskeletal ROS (+)   Abdominal   Peds negative pediatric ROS (+)  Hematology negative hematology ROS (+)   Anesthesia Other Findings   Reproductive/Obstetrics negative OB ROS                             Anesthesia Physical Anesthesia Plan  ASA: II  Anesthesia Plan: General   Post-op Pain Management:    Induction: Intravenous  PONV Risk Score and Plan: 2 and Ondansetron and Treatment may vary due to age or medical condition  Airway Management Planned: Oral ETT  Additional Equipment:   Intra-op Plan:   Post-operative Plan: Extubation in OR  Informed Consent: I have reviewed the patients History and Physical, chart, labs and discussed the procedure including the risks, benefits and alternatives for the proposed anesthesia with the patient or authorized representative who has indicated his/her understanding and acceptance.   Dental advisory given  Plan Discussed with: CRNA  Anesthesia Plan Comments:         Anesthesia Quick Evaluation

## 2017-05-22 NOTE — Anesthesia Postprocedure Evaluation (Signed)
Anesthesia Post Note  Patient: Evan Escobar  Procedure(s) Performed: Anterior Cervical Discectomy Fusion - Cervical Three-Cervical Four - Cervical Four-Cervical Five (N/A )     Patient location during evaluation: PACU Anesthesia Type: General Level of consciousness: awake and alert Pain management: pain level controlled Vital Signs Assessment: post-procedure vital signs reviewed and stable Respiratory status: spontaneous breathing, nonlabored ventilation, respiratory function stable and patient connected to nasal cannula oxygen Cardiovascular status: stable Postop Assessment: no apparent nausea or vomiting Anesthetic complications: no Comments: Hypertensive in PACU, only slightly higher than in preop. Treated accordingly    Last Vitals:  Vitals:   05/22/17 1300 05/22/17 1315  BP: (!) 191/75 (!) 193/75  Pulse: (!) 54 (!) 57  Resp: 13 13  Temp:    SpO2: 100% 100%    Last Pain:  Vitals:   05/22/17 1315  TempSrc:   PainSc: [place]

## 2017-05-22 NOTE — Anesthesia Procedure Notes (Signed)
Procedure Name: Intubation Date/Time: 05/22/2017 10:12 AM Performed by: Renato Shin, CRNA Pre-anesthesia Checklist: Patient identified, Emergency Drugs available, Suction available and Patient being monitored Patient Re-evaluated:Patient Re-evaluated prior to induction Oxygen Delivery Method: Circle system utilized Preoxygenation: Pre-oxygenation with 100% oxygen Induction Type: IV induction Ventilation: Mask ventilation without difficulty Laryngoscope Size: Miller and 3 Grade View: Grade I Tube type: Oral Tube size: 7.5 mm Number of attempts: 1 Airway Equipment and Method: Stylet Placement Confirmation: ETT inserted through vocal cords under direct vision,  positive ETCO2,  CO2 detector and breath sounds checked- equal and bilateral Secured at: 21 cm Tube secured with: Tape Dental Injury: Teeth and Oropharynx as per pre-operative assessment  Comments: CSpine neutral throughout

## 2017-05-22 NOTE — Plan of Care (Signed)

## 2017-05-22 NOTE — Transfer of Care (Signed)
Immediate Anesthesia Transfer of Care Note  Patient: Evan Escobar  Procedure(s) Performed: Anterior Cervical Discectomy Fusion - Cervical Three-Cervical Four - Cervical Four-Cervical Five (N/A )  Patient Location: PACU  Anesthesia Type:General  Level of Consciousness: awake, oriented, sedated and patient cooperative  Airway & Oxygen Therapy: Patient Spontanous Breathing and Patient connected to nasal cannula oxygen  Post-op Assessment: Report given to RN and Post -op Vital signs reviewed and stable  Post vital signs: Reviewed and stable  Last Vitals:  Vitals Value Taken Time  BP 160/70 05/22/2017 12:11 PM  Temp    Pulse 64 05/22/2017 12:11 PM  Resp 12 05/22/2017 12:11 PM  SpO2 100 % 05/22/2017 12:11 PM  Vitals shown include unvalidated device data.  Last Pain:  Vitals:   05/22/17 0838  TempSrc:   PainSc: 5       Patients Stated Pain Goal: 3 (79/02/40 9735)  Complications: No apparent anesthesia complications

## 2017-05-22 NOTE — Progress Notes (Signed)
Orthopedic Tech Progress Note Patient Details:  Evan Escobar 11/09/1940 [id number]  Ortho Devices Type of Ortho Device: Soft collar Ortho Device/Splint Location: Soft Collar applied to pt.  pt tolerated application very well.  Ortho Device/Splint Interventions: Application   Post Interventions Patient Tolerated: Well Instructions Provided: Care of device, Adjustment of device   Kristopher Oppenheim 05/22/2017, 12:53 PM

## 2017-05-22 NOTE — Op Note (Signed)
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  EBL: Minimal  History of present illness: 77 year old gentleman with pain in his neck pain in his left shoulder and numbness Tinley weakness in his hands and difficulty walking. Clinical exam was consistent with a myelopathy imaging showed severe cervical stenosis with cord compression C3-4 C4-5. Due to patient's imaging findings and progressive conical syndrome and failure conservative treatment I recommended anterior cervical discectomies and fusion at those levels. I extensively went over the risks and benefits of the operation with the patient as well as perioperative course expectations of outcome and alternatives of surgery and he understood and agreed to proceed forward.  Operative procedure: Patient brought into the or was induced on general anesthesia positioned supine the neck in slight extension in 5 pounds of halter traction the right side of his neck was prepped and draped in routine sterile fashion. Preoperative x-ray localize the appropriate level so a curvilinear incision was made just off midline to the antebrachial the sternomastoid super slipped this was dissected and divided longitudinally the avascular plane between the sternomastoid and strap muscles was developed down to 3 fashion. Fascia facet with Kitners. Interoperative x-ray confirmed the location of the appropriate level so anterior ossified tubing off with Leksell rongeur annulotomy was made with the scalpel  marked the disc space longus was reflected laterally and self-retaining retractors were placed. Then the disc space at 34 was first identified and incised the 45 was then incised both disc spaces were cleaned out with upgoing curettes and pituitary rongeurs. Under microscopic illumination working first at C3-4 disc space is drilled down capturing the bone shavings in a mucous trap. There was a large spur coming off the posterior aspect of the C3 vertebral body and a dense amount of disc material that had migrated subligamentous this is all removed decompress the central canal. Marking laterally both C4 pedicles were identified both C4 nerve roots a decompressed flush with pedicle. Aggressive under biting both endplates removed some further disc material and the thecal sac resumed its normal anatomic and well decompressed position. This is an packed with Gelfoam to second C4-5 and a similar fashion C4-5 was drilled down with a high-speed drill capturing the bone shavings in a mucous trap. Large spurs coming off the C4 and C5 vertebra vertebral body were aggressively under bitten both C5 nerve roots were identified and skeletonized decompressed flush with pedicle. There was a large spur rightward with a large disc herniation that level this was also teased within her foot. At the end the discectomy was no further stenosis either centrally or foraminally. Then an 8 mm lordotic peek cage was selected for C3-4 to 7 mm 4 C4-5. Both cages were packed with local autograft mixed with vivigen and inserted 1-2 mm deep to the anterior vertebral line and then selected a 32 mm globus extend plate all screws excellent purchase locking mechanisms were engaged wounds and copious irrigated meticulous in space was maintained some additional bone graft had been packed laterally to the cages prior to plate placement. Then the wounds closed in layers with interrupted Vicryl skin  screws running 4 septic or Dermabond benzo and Steri-Strips  and sterile dressing was applied patient recovered in stable condition. At the end of case on it counts sponge counts were correct.

## 2017-05-22 NOTE — H&P (Signed)
Evan Escobar is an 77 y.o. male.   Chief Complaint: Neck pain left shoulder pain. HPI: 77 year old gentleman with long-standing neck and left shoulder pain numbness tingling weakness in his hands difficult walking. Workup revealed severe cervical stenosis spinal cord compression at C3-4 and C4-5. Due to patient progressive conical syndrome imaging findings failure conservative treatment I recommended anterior cervical discectomies and fusion at those levels. I extensively went over the risks and benefits of the procedure the patient as well as perioperative course expectations of outcome and alternatives of surgery and he understood and agreed to proceed forward.  Past Medical History:  Diagnosis Date  . Allergy   . Arthritis   . Cataract   . Glaucoma   . Hypertension   . Normocytic anemia 09/25/2012  . Polyp, sigmoid colon 11/05/2011  . Seizure ([place])   . Seizures ([place]) 03/17/2013    Past Surgical History:  Procedure Laterality Date  . CATARACT EXTRACTION, BILATERAL    . EYE SURGERY    . GLAUCOMA REPAIR  10 yrs ago    Family History  Problem Relation Age of Onset  . Stroke Mother   . High blood pressure Mother   . Stroke Father   . High blood pressure Father   . Colon cancer Brother   . Esophageal cancer Neg Hx   . Pancreatic cancer Neg Hx   . Rectal cancer Neg Hx   . Stomach cancer Neg Hx    Social History:  reports that he quit smoking about 10 years ago. His smoking use included cigarettes. He smoked 1.00 pack per day. He has never used smokeless tobacco. He reports that he does not drink alcohol or use drugs.  Allergies: No Known Allergies  Medications Prior to Admission  Medication Sig Dispense Refill  . acetaminophen (TYLENOL) 500 MG tablet Take 500 mg by mouth every 6 (six) hours as needed for moderate pain or headache.     Marland Kitchen aspirin 81 MG tablet Take 81 mg by mouth daily.    . brimonidine (ALPHAGAN P) 0.1 % SOLN Place 1 drop 3 (three) times daily into both eyes.     . cycloSPORINE (RESTASIS) 0.05 % ophthalmic emulsion Place 1 drop into both eyes 2 (two) times daily.    . diclofenac (VOLTAREN) 75 MG EC tablet Take 75 mg by mouth 2 (two) times daily.    Marland Kitchen latanoprost (XALATAN) 0.005 % ophthalmic solution Place 1 drop into both eyes at bedtime.     . levETIRAcetam (KEPPRA) 500 MG tablet Take 1 tablet (500 mg total) by mouth 2 (two) times daily. 180 tablet 3  . lisinopril (PRINIVIL,ZESTRIL) 10 MG tablet Take 1 tablet (10 mg total) by mouth daily. 90 tablet 3  . magnesium hydroxide (MILK OF MAGNESIA) 400 MG/5ML suspension Take 30 mLs by mouth daily as needed for mild constipation.    . Multiple Vitamin (MULTIVITAMIN) tablet Take 1 tablet by mouth daily.    . tamsulosin (FLOMAX) 0.4 MG CAPS capsule Take 0.4 mg daily by mouth.      No results found for this or any previous visit (from the past 48 hour(s)). No results found.  Review of Systems  Musculoskeletal: Positive for joint pain and neck pain.  Neurological: Positive for sensory change.    Blood pressure (!) 188/79, pulse 63, temperature 98.1 F (36.7 C), temperature source Oral, resp. rate 20, height 5\' 7"  (1.702 m), weight 67.6 kg (149 lb), SpO2 100 %. Physical Exam  Constitutional: He is oriented to person, place,  and time. He appears well-developed.  HENT:  Head: Normocephalic.  Eyes: Pupils are equal, round, and reactive to light.  Neck: Normal range of motion.  Respiratory: Effort normal.  GI: Soft.  Neurological: He is alert and oriented to person, place, and time. He has normal strength. GCS eye subscore is 4. GCS verbal subscore is 5. GCS motor subscore is 6.  Patient is awake and alert strength 5 out of 5 upper and lower extremities deltoid, bicep, tricep, wrist flexion, wrist extension, hand intrinsics.  Skin: Skin is warm and dry.     Assessment/Plan 77 year old gentleman presents for an ACDF C3-4 C4-5.  Alesandra Smart P, MD 05/22/2017, 9:36 AM

## 2017-05-23 MED ORDER — OXYCODONE HCL 10 MG PO TABS: 5.0000 mg | ORAL_TABLET | ORAL | 0 refills | Status: DC | PRN

## 2017-05-23 NOTE — Discharge Summary (Signed)
Physician Discharge Summary  Patient ID: Evan Escobar MRN: [id number] DOB/AGE: 03-12-1940 77 y.o.  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 Saintclair Halsted, who performed a 2 level ACDF.  Postoperatively the patient is done well.  He has been up and ambulating.  He has been working with physical therapy yesterday and today.  He is being discharged home with instructions regarding wound care and activities.  His family and he are to remove his dressing in 3 days.  He is not to drive or ride until after he visits Dr. Saintclair Halsted.  Discharge Exam: Blood pressure (!) 157/77, pulse 67, temperature 98.4 F (36.9 C), temperature source Oral, resp. rate 16, height 5\' 7"  (1.702 m), weight 67.6 kg (149 lb), SpO2 99 %.  Disposition: Discharge disposition: 01-Home or Self Care       Discharge Instructions    Discharge wound care:   Complete by:  As directed    Remove dressing after 3 days.  May shower.   Driving Restrictions   Complete by:  As directed    No riding in the car or driving until cleared by Dr. Saintclair Halsted.   Other Restrictions   Complete by:  As directed    Walk gradually increasing distances out in the fresh air at least twice a day. Walking additional 6 times inside the house, gradually increasing distances, daily. No bending, lifting, or twisting. Perform activities between shoulder and waist height (that is at counter height when standing or table height when sitting).     Allergies as of 05/23/2017   No Known Allergies     Medication List    TAKE these medications   acetaminophen 500 MG tablet Commonly known as:  TYLENOL Take  500 mg by mouth every 6 (six) hours as needed for moderate pain or headache.   aspirin 81 MG tablet Take 81 mg by mouth daily.   brimonidine 0.1 % Soln Commonly known as:  ALPHAGAN P Place 1 drop 3 (three) times daily into both eyes.   diclofenac 75 MG EC tablet Commonly known as:  VOLTAREN Take 75 mg by mouth 2 (two) times daily.   latanoprost 0.005 % ophthalmic solution Commonly known as:  XALATAN Place 1 drop into both eyes at bedtime.   levETIRAcetam 500 MG tablet Commonly known as:  KEPPRA Take 1 tablet (500 mg total) by mouth 2 (two) times daily.   lisinopril 10 MG tablet Commonly known as:  PRINIVIL,ZESTRIL Take 1 tablet (10 mg total) by mouth daily.   magnesium hydroxide 400 MG/5ML suspension Commonly known as:  MILK OF MAGNESIA Take 30 mLs by mouth daily as needed for mild constipation.   multivitamin tablet Take 1 tablet by mouth daily.   Oxycodone HCl 10 MG Tabs Take 0.5-1 tablets (5-10 mg total) by mouth every 3 (three) hours as needed (pain).   RESTASIS 0.05 % ophthalmic emulsion Generic drug:  cycloSPORINE Place 1 drop into both eyes 2 (two) times daily.   tamsulosin 0.4 MG Caps capsule Commonly known as:  FLOMAX Take 0.4 mg daily by mouth.            Discharge Care Instructions  (From admission, onward)  Start     Ordered   05/23/17 0000  Discharge wound care:    Comments:  Remove dressing after 3 days.  May shower.   05/23/17 0733       Signed: Hosie Spangle 05/23/2017, 7:33 AM

## 2017-05-23 NOTE — Progress Notes (Signed)
Physical Therapy Treatment Patient Details Name: Evan Escobar MRN: [id number] DOB: [birth year] Today's Date: 05/23/2017    History of Present Illness pt is a 77 y/o male with pmh significant for HTN, glaucoma, seizures, admitted with long-standing neck and left shoulder pain, numbness and weakness.  Workup revealed severe cervical stenosis.  Pt s/p C3-4 and C4-5 ACDF with autograft.    PT Comments    Pt with improved transfer and ambulation ability/tolerance. Pt with no recall of precautions, pt re-educated. Pt to benefit from use of SPC in R UE for ambulation to improve gait kinematics and tolerance. Pt agreed. Acute PT to con't to follow.   Follow Up Recommendations  No PT follow up;Supervision - Intermittent(may benefit from outpt PT for balance once cleared by MD)     Equipment Recommendations  None recommended by PT    Recommendations for Other Services       Precautions / Restrictions Precautions Precautions: Cervical Precaution Booklet Issued: Yes (comment) Precaution Comments: pt re-educated. pt with no recall from initial eval Required Braces or Orthoses: Cervical Brace Cervical Brace: Soft collar;At all times Restrictions Weight Bearing Restrictions: No    Mobility  Bed Mobility Overal bed mobility: Needs Assistance Bed Mobility: Rolling;Sidelying to Sit Rolling: Supervision Sidelying to sit: Supervision     Sit to sidelying: Supervision General bed mobility comments: pt initially attempted to pull self up, pt re-educated on rolling/sidelying and precautions  Transfers Overall transfer level: Needs assistance Equipment used: None Transfers: Sit to/from Stand Sit to Stand: Supervision         General transfer comment: pt quick,, v/c's to slow down, pt states "I know I get up to fast"  Ambulation/Gait Ambulation/Gait assistance: Min guard Ambulation Distance (Feet): 200 Feet Assistive device: 1 person hand held assist;None Gait Pattern/deviations:  Step-through pattern;Decreased stride length Gait velocity: slow Gait velocity interpretation: Below normal speed for age/gender General Gait Details: mildly antalgic, short, shuffled steps. when given R HHA pt with improved step length and cadence with R HHA. Pt agreed he would use his cane at home   Stairs            Wheelchair Mobility    Modified Rankin (Stroke Patients Only)       Balance Overall balance assessment: Mild deficits observed, not formally tested                                          Cognition Arousal/Alertness: Awake/alert Behavior During Therapy: WFL for tasks assessed/performed Overall Cognitive Status: Impaired/Different from baseline Area of Impairment: Memory;Problem solving                     Memory: Decreased recall of precautions;Decreased short-term memory       Problem Solving: Slow processing;Requires verbal cues;Requires tactile cues General Comments: HOH      Exercises      General Comments        Pertinent Vitals/Pain Pain Assessment: 0-10 Pain Score: 4  Pain Location: neck Pain Descriptors / Indicators: Sore Pain Intervention(s): Premedicated before session    Home Living                      Prior Function            PT Goals (current goals can now be found in the care plan section) Acute Rehab PT Goals Patient Stated  Goal: independent Progress towards PT goals: Progressing toward goals    Frequency    Min 5X/week      PT Plan Current plan remains appropriate    Co-evaluation              AM-PAC PT "6 Clicks" Daily Activity  Outcome Measure  Difficulty turning over in bed (including adjusting bedclothes, sheets and blankets)?: A Little Difficulty moving from lying on back to sitting on the side of the bed? : A Little Difficulty sitting down on and standing up from a chair with arms (e.g., wheelchair, bedside commode, etc,.)?: A Little Help needed moving to  and from a bed to chair (including a wheelchair)?: A Little Help needed walking in hospital room?: A Little Help needed climbing 3-5 steps with a railing? : A Little 6 Click Score: 18    End of Session Equipment Utilized During Treatment: Cervical collar Activity Tolerance: Patient tolerated treatment well Patient left: in chair;with call bell/phone within reach Nurse Communication: Mobility status PT Visit Diagnosis: Unsteadiness on feet (R26.81);Other symptoms and signs involving the nervous system (R29.898);Pain Pain - Right/Left: Left Pain - part of body: Shoulder;Arm     Time: 0703-0721 PT Time Calculation (min) (ACUTE ONLY): 18 min  Charges:  $Gait Training: 8-22 mins                    G Codes:       Kittie Plater, PT, DPT Pager #: [phone] Office #: [phone]    [place] 05/23/2017, 7:27 AM

## 2017-05-23 NOTE — Discharge Instructions (Signed)

## 2017-05-23 NOTE — Evaluation (Signed)
Occupational Therapy Evaluation Patient Details Name: Evan Escobar MRN: [id number] DOB: Mar 09, 1940 Today's Date: 05/23/2017    History of Present Illness pt is a 77 y/o male with pmh significant for HTN, glaucoma, seizures, admitted with long-standing neck and left shoulder pain, numbness and weakness.  Workup revealed severe cervical stenosis.  Pt s/p C3-4 and C4-5 ACDF with autograft.   Clinical Impression   Pt s/p ACDF. Pt requiring supervision-min assist with ADLs and functional mobility with frequent cues to adhere to precautions.  His wife was present during session, and she demonstrates good understanding of precautions as well as ability to assist patient at home.  Pt plans to discharge home today.  Education completed. Will discharge patient from therapy.    Follow Up Recommendations  No OT follow up;Supervision/Assistance - 24 hour    Equipment Recommendations  None recommended by OT    Recommendations for Other Services       Precautions / Restrictions Precautions Precautions: Cervical Precaution Booklet Issued: Yes (comment) Precaution Comments: Reviewed precautions Required Braces or Orthoses: Cervical Brace Cervical Brace: Soft collar;At all times Restrictions Weight Bearing Restrictions: No      Mobility Bed Mobility Overal bed mobility: (pt sitting EOB)     Transfers Overall transfer level: Needs assistance Equipment used: None Transfers: Sit to/from Stand Sit to Stand: Supervision            Balance                                         ADL either performed or assessed with clinical judgement   ADL Overall ADL's : Needs assistance/impaired         Upper Body Bathing: Supervision/ safety;Set up;Sitting   Lower Body Bathing: Minimal assistance;Sit to/from stand   Upper Body Dressing : Minimal assistance;Sitting   Lower Body Dressing: Minimal assistance;Sit to/from stand   Toilet Transfer:  Supervision/safety;Ambulation;Comfort height toilet   Toileting- Clothing Manipulation and Hygiene: Supervision/safety;Sit to/from stand       Functional mobility during ADLs: Supervision/safety General ADL Comments: Pt completed upper and lower body dressing with min assist and frequent cues to maintain precautions (often bending down to reach for items on floor).  His wife was present during session and demonstrated independence with assisting patient and also demonstrated independence with understanding of precautions.     Vision         Perception     Praxis      Pertinent Vitals/Pain Pain Assessment: 0-10 Pain Score: 3  Pain Location: neck Pain Descriptors / Indicators: Sore Pain Intervention(s): Monitored during session     Hand Dominance Right   Extremity/Trunk Assessment Upper Extremity Assessment Upper Extremity Assessment: LUE deficits/detail LUE Deficits / Details: Pt reports some numbness in left fingers which was present prior to surgery.           Communication     Cognition Arousal/Alertness: Awake/alert Behavior During Therapy: WFL for tasks assessed/performed Overall Cognitive Status: Impaired/Different from baseline Area of Impairment: Safety/judgement                     Memory: Decreased recall of precautions        General Comments: HOH   General Comments       Exercises     Shoulder Instructions      Home Living Family/patient expects to be discharged to:: Private residence Living Arrangements:  Spouse/significant other Available Help at Discharge: Family;Available 24 hours/day Type of Home: House Home Access: Stairs to enter [employer] of Steps: 2 Entrance Stairs-Rails: Left;Right;None Home Layout: One level     Bathroom Shower/Tub: Teacher, early years/pre: Standard                Prior Functioning/Environment Level of Independence: Independent                 OT Problem List:  Impaired balance (sitting and/or standing);Pain      OT Treatment/Interventions:      OT Goals(Current goals can be found in the care plan section) Acute Rehab OT Goals Patient Stated Goal: independent  OT Frequency:     Barriers to D/C:            Co-evaluation              AM-PAC PT "6 Clicks" Daily Activity     Outcome Measure                 End of Session Nurse Communication: (informed RN that patient was finished with therapy)  Activity Tolerance: Patient tolerated treatment well Patient left: in bed;with call bell/phone within reach;with family/visitor present  OT Visit Diagnosis: Unsteadiness on feet (R26.81);Pain Pain - part of body: (neck)                Time: 3159-4585 OT Time Calculation (min): 17 min Charges:  OT General Charges $OT Visit: 1 Visit OT Evaluation $OT Eval Low Complexity: 1 Low G-Codes:      Jun 12, 2017 Darrol Jump OTR/L Office [phone]  06/12/2017, 10:40 AM

## 2017-05-23 NOTE — Progress Notes (Signed)
Patient is discharged from room 3C07 at this time. Alert and in stable condition. IV site d/c'd and instructions read to patient and spouse with understanding verbalized. Left unit via wheelchair with all belongings at side. 

## 2017-05-25 MED FILL — Thrombin For Soln 5000 Unit: CUTANEOUS | Qty: 5000 | Status: AC

## 2017-05-27 ENCOUNTER — Encounter (HOSPITAL_COMMUNITY): Payer: Self-pay | Admitting: Neurosurgery

## 2017-06-25 ENCOUNTER — Ambulatory Visit: Payer: Medicare Other | Admitting: Internal Medicine

## 2017-07-02 DIAGNOSIS — M542 Cervicalgia: Secondary | ICD-10-CM | POA: Diagnosis not present

## 2017-07-14 NOTE — Progress Notes (Signed)
[facility]  PATIENT: Evan Escobar DOB: [birth year]   REASON FOR VISIT: Follow-up for seizure disorder HISTORY FROM: Patient    HISTORY OF PRESENT ILLNESS: UPDATE 5/29/2019CM Evan Escobar,.77 year old male returns for follow-up with history of epilepsy .  He is currently on Keppra 500mg  twice daily.  He denies side effects.  Last seizure occurred September 2014.  He had cervical discectomy May 22, 2017 by Dr. Saintclair Escobar.  He is continuing to recuperate.  He is back to driving without difficulty.  He returns for reevaluation neck  UPDATE 05/29/2018CM Evan Escobar, 77 year old male returns for follow-up history of epilepsy last seizure occurring September 2014. He is currently on Keppra 500 twice daily tolerating the medication without side effects. He remains fairly active. Appetite is good he is sleeping well. MRI and EEG in the past been normal. He is driving without difficulty. He returns for reevaluation   06/12/15 Evan Escobar is a 77 years old right-handed male, following up for epilepsy,  He was previously evaluated by Dr. Janann Colonel who has left practice, and followed up by [facility] practitioner, he presented with one seizure in September 2014, witnessed by his wife, morning sound, body tonic-clonic shaking, he woke up on the ambulance was brought to the emergency room,  Per patient, he was drinking daily use cocaine frequently during that period of time, about 1 year prior to his seizure, he had frequent spells of feeling going to pass out, could not respond to surroundings.  I have personally reviewed MRI of brain with without contrast in September 2014 that was normal, EEG was normal in September 2014  He was started on Keppra 500 mg twice a day, he has been compliant with his medications, he is no longer drinking or using illicit drugs, there was no recurrent spells, there is no generalized seizure.  He is driving, per ED record, he suffered a T-bone  injury in February 2017, there was no seizure activity described, I personally reviewed repeat CAT scan of the brain that was normal.   REVIEW OF SYSTEMS: Full 14 system review of systems performed and notable only for those listed, all others are neg:  Constitutional: neg  Cardiovascular: neg Ear/Nose/Throat: neg  Skin: neg Eyes: neg Respiratory: neg Gastroitestinal: neg  Hematology/Lymphatic: neg  Endocrine: neg Musculoskeletal: Neck  pain Allergy/Immunology: neg Neurological: History of seizure activity Psychiatric: neg Sleep : neg   ALLERGIES: No Known Allergies  HOME MEDICATIONS: Outpatient Medications Prior to Visit  Medication Sig Dispense Refill  . acetaminophen (TYLENOL) 500 MG tablet Take 500 mg by mouth every 6 (six) hours as needed for moderate pain or headache.     Marland Kitchen aspirin 81 MG tablet Take 81 mg by mouth daily.    . brimonidine (ALPHAGAN P) 0.1 % SOLN Place 1 drop 3 (three) times daily into both eyes.    . cycloSPORINE (RESTASIS) 0.05 % ophthalmic emulsion Place 1 drop into both eyes 2 (two) times daily.    Marland Kitchen latanoprost (XALATAN) 0.005 % ophthalmic solution Place 1 drop into both eyes at bedtime.     . levETIRAcetam (KEPPRA) 500 MG tablet Take 1 tablet (500 mg total) by mouth 2 (two) times daily. 180 tablet 3  . lisinopril (PRINIVIL,ZESTRIL) 10 MG tablet Take 1 tablet (10 mg total) by mouth daily. 90 tablet 3  . magnesium hydroxide (MILK OF MAGNESIA) 400 MG/5ML suspension Take 30 mLs by mouth daily as needed for mild constipation.    . Multiple Vitamin (MULTIVITAMIN) tablet Take 1 tablet  by mouth daily.    . tamsulosin (FLOMAX) 0.4 MG CAPS capsule Take 0.4 mg daily by mouth.    . diclofenac (VOLTAREN) 75 MG EC tablet Take 75 mg by mouth 2 (two) times daily.    Marland Kitchen oxyCODONE 10 MG TABS Take 0.5-1 tablets (5-10 mg total) by mouth every 3 (three) hours as needed (pain). (Patient not taking: Reported on 07/15/2017) 40 tablet 0   No facility-administered medications  prior to visit.     PAST MEDICAL HISTORY: Past Medical History:  Diagnosis Date  . Allergy   . Arthritis   . Cataract   . Glaucoma   . Hypertension   . Normocytic anemia 09/25/2012  . Polyp, sigmoid colon 11/05/2011  . Seizure ([place])   . Seizures ([place]) 03/17/2013    PAST SURGICAL HISTORY: Past Surgical History:  Procedure Laterality Date  . ANTERIOR CERVICAL DECOMP/DISCECTOMY FUSION N/A 05/22/2017   Procedure: Anterior Cervical Discectomy Fusion - Cervical Three-Cervical Four - Cervical Four-Cervical Five;  Surgeon: Kary Kos, MD;  Location: [facility];  Service: Neurosurgery;  Laterality: N/A;  . CATARACT EXTRACTION, BILATERAL    . EYE SURGERY    . GLAUCOMA REPAIR  10 yrs ago    FAMILY HISTORY: Family History  Problem Relation Age of Onset  . Stroke Mother   . High blood pressure Mother   . Stroke Father   . High blood pressure Father   . Colon cancer Brother   . Esophageal cancer Neg Hx   . Pancreatic cancer Neg Hx   . Rectal cancer Neg Hx   . Stomach cancer Neg Hx     SOCIAL HISTORY: Social History   Socioeconomic History  . Marital status: Married    Spouse name: Terrence Dupont  . Number of children: 1  . Years of education: 77  . Highest education level: Not on file  Occupational History  . Occupation: retired  Scientific laboratory technician  . Financial resource strain: Not on file  . Food insecurity:    Worry: Not on file    Inability: Not on file  . Transportation needs:    Medical: Not on file    Non-medical: Not on file  Tobacco Use  . Smoking status: Former Smoker    Packs/day: 1.00    Types: Cigarettes    Last attempt to quit: 02/18/2007    Years since quitting: 10.4  . Smokeless tobacco: Never Used  . Tobacco comment: not sure but a while ago; quit smoking and ETOH  Substance and Sexual Activity  . Alcohol use: No    Alcohol/week: 0.0 oz    Comment: quit in 2009  . Drug use: No  . Sexual activity: Not on file  Lifestyle  . Physical activity:    Days per week: Not on  file    Minutes per session: Not on file  . Stress: Not on file  Relationships  . Social connections:    Talks on phone: Not on file    Gets together: Not on file    Attends religious service: Not on file    Active member of club or organization: Not on file    Attends meetings of clubs or organizations: Not on file    Relationship status: Not on file  . Intimate partner violence:    Fear of current or ex partner: Not on file    Emotionally abused: Not on file    Physically abused: Not on file    Forced sexual activity: Not on file  Other  Topics Concern  . Not on file  Social History Narrative   Patient lives at home with his wife Terrence Dupont). Patient is retired. Patient has 11 th grade education.    Caffeine- one cup of coffee daily and one soda.   Right handed.     PHYSICAL EXAM  Vitals:   07/15/17 0752  BP: 113/60  Pulse: 80  Weight: 144 lb 3.2 oz (65.4 kg)  Height: 5\' 7"  (1.702 m)   Body mass index is 22.58 kg/m.  Generalized: Well developed, in no acute distress  Head: normocephalic and atraumatic,. Oropharynx benign  Neck: Supple,   Musculoskeletal: No deformity   Neurological examination   Mentation: Alert oriented to time, place, history taking. Attention span and concentration appropriate. Recent and remote memory intact.  Follows all commands speech and language fluent.   Cranial nerve II-XII: Pupils were equal round reactive to light extraocular movements were full, visual field were full on confrontational test. Facial sensation and strength were normal. hearing was intact to finger rubbing bilaterally. Uvula tongue midline. head turning and shoulder shrug were normal and symmetric.Tongue protrusion into cheek strength was normal. Motor: normal bulk and tone, full strength in the BUE, BLE,  Sensory: normal and symmetric to light touch,pinprick and vibratory  in the upper and lower extremities Coordination: finger-nose-finger, heel-to-shin bilaterally, no  dysmetria Reflexes: Symmetric upper and lower, plantar responses were flexor bilaterally. Gait and Station: Rising up from seated position without assistance, normal stance,  moderate stride, good arm swing, smooth turning, able to perform tiptoe, and heel walking without difficulty. Tandem gait is steady. No assistive device  DIAGNOSTIC DATA (LABS, IMAGING, TESTING) - I reviewed patient records, labs, notes, testing and imaging myself where available.  Lab Results  Component Value Date   WBC 4.2 05/13/2017   HGB 12.0 (L) 05/13/2017   HCT 38.2 (L) 05/13/2017   MCV 84.9 05/13/2017   PLT 151 05/13/2017      Component Value Date/Time   NA 139 05/13/2017 0859   K 4.2 05/13/2017 0859   CL 106 05/13/2017 0859   CO2 26 05/13/2017 0859   GLUCOSE 103 (H) 05/13/2017 0859   BUN 20 05/13/2017 0859   CREATININE 1.18 05/13/2017 0859   CALCIUM 9.2 05/13/2017 0859   PROT 6.8 09/10/2016 0814   ALBUMIN 4.0 09/10/2016 0814   AST 15 09/10/2016 0814   ALT 8 09/10/2016 0814   ALKPHOS 27 (L) 09/10/2016 0814   BILITOT 0.6 09/10/2016 0814   GFRNONAA 59 (L) 05/13/2017 0859   GFRAA >60 05/13/2017 0859   Lab Results  Component Value Date   CHOL 168 09/10/2016   HDL 59.60 09/10/2016   LDLCALC 100 (H) 09/10/2016   TRIG 43.0 09/10/2016   CHOLHDL 3 09/10/2016   No results found for: HGBA1C  Lab Results  Component Value Date   TSH 1.09 09/10/2016      ASSESSMENT AND PLAN  77 y.o. year old male  has a past medical history of  Seizure ([place]); here To follow-up. Last seizure event in 2014.   Continue Keppra 500 twice daily will refill for one year Call for any seizure activity Follow-up yearly and when necessary I spent 15 min  in total face to face time with the patient more than 50% of which was spent counseling and coordination of care, reviewing test results reviewing medications and discussing and reviewing the diagnosis of seizure disorder and seizure precautions and the importance of  medication management. Take meds as directed  Dicky Doe  Sandie Ano, [facility], APRN  [facility] [address] [place], [place] [address] [phone]

## 2017-07-15 ENCOUNTER — Ambulatory Visit: Payer: Medicare Other | Admitting: Nurse Practitioner

## 2017-07-15 ENCOUNTER — Encounter: Payer: Self-pay | Admitting: Nurse Practitioner

## 2017-07-15 VITALS — BP 113/60 | HR 80 | Ht 67.0 in | Wt 144.2 lb

## 2017-07-15 DIAGNOSIS — R569 Unspecified convulsions: Secondary | ICD-10-CM

## 2017-07-15 MED ORDER — LEVETIRACETAM 500 MG PO TABS: 500.0000 mg | ORAL_TABLET | Freq: Two times a day (BID) | ORAL | 3 refills | Status: DC

## 2017-07-15 NOTE — Progress Notes (Signed)
I have reviewed and agreed above plan. 

## 2017-07-15 NOTE — Patient Instructions (Signed)
Continue Keppra 500 twice daily will refill for one year Call for any seizure activity Follow-up yearly and when necessary

## 2017-07-16 ENCOUNTER — Ambulatory Visit: Payer: Medicare Other | Admitting: Nurse Practitioner

## 2017-07-20 ENCOUNTER — Telehealth: Payer: Self-pay | Admitting: Emergency Medicine

## 2017-07-20 NOTE — Telephone Encounter (Signed)
Called patient to schedule AWV. Patient will call back to schedule at later date.

## 2017-07-28 DIAGNOSIS — H401133 Primary open-angle glaucoma, bilateral, severe stage: Secondary | ICD-10-CM | POA: Diagnosis not present

## 2017-08-11 ENCOUNTER — Other Ambulatory Visit: Payer: Self-pay | Admitting: Nurse Practitioner

## 2017-08-13 DIAGNOSIS — M4712 Other spondylosis with myelopathy, cervical region: Secondary | ICD-10-CM | POA: Diagnosis not present

## 2017-09-11 ENCOUNTER — Encounter: Payer: Self-pay | Admitting: Internal Medicine

## 2017-09-11 ENCOUNTER — Other Ambulatory Visit (INDEPENDENT_AMBULATORY_CARE_PROVIDER_SITE_OTHER): Payer: Medicare Other

## 2017-09-11 ENCOUNTER — Ambulatory Visit (INDEPENDENT_AMBULATORY_CARE_PROVIDER_SITE_OTHER): Payer: Medicare Other | Admitting: Internal Medicine

## 2017-09-11 VITALS — BP 118/80 | HR 56 | Temp 97.6°F | Ht 67.0 in | Wt 146.0 lb

## 2017-09-11 DIAGNOSIS — N401 Enlarged prostate with lower urinary tract symptoms: Secondary | ICD-10-CM | POA: Diagnosis not present

## 2017-09-11 DIAGNOSIS — R569 Unspecified convulsions: Secondary | ICD-10-CM | POA: Diagnosis not present

## 2017-09-11 DIAGNOSIS — D649 Anemia, unspecified: Secondary | ICD-10-CM | POA: Diagnosis not present

## 2017-09-11 DIAGNOSIS — R350 Frequency of micturition: Secondary | ICD-10-CM

## 2017-09-11 DIAGNOSIS — Z0000 Encounter for general adult medical examination without abnormal findings: Secondary | ICD-10-CM | POA: Diagnosis not present

## 2017-09-11 DIAGNOSIS — I10 Essential (primary) hypertension: Secondary | ICD-10-CM | POA: Diagnosis not present

## 2017-09-11 LAB — LIPID PANEL
Cholesterol: 176 mg/dL (ref 0–200)
HDL: 62.3 mg/dL (ref >39.00)
LDL Cholesterol: 97 mg/dL (ref 0–99)
NonHDL: 113.63
Total CHOL/HDL Ratio: 3
Triglycerides: 82 mg/dL (ref 0.0–149.0)
VLDL: 16.4 mg/dL (ref 0.0–40.0)

## 2017-09-11 LAB — COMPREHENSIVE METABOLIC PANEL
ALT: 9 U/L (ref 0–53)
AST: 14 U/L (ref 0–37)
Albumin: 4.1 g/dL (ref 3.5–5.2)
Alkaline Phosphatase: 32 U/L — ABNORMAL LOW (ref 39–117)
BUN: 12 mg/dL (ref 6–23)
CO2: 30 mEq/L (ref 19–32)
Calcium: 9.1 mg/dL (ref 8.4–10.5)
Chloride: 103 mEq/L (ref 96–112)
Creatinine, Ser: 1.12 mg/dL (ref 0.40–1.50)
GFR: 81.95 mL/min (ref >60.00)
Glucose, Bld: 88 mg/dL (ref 70–99)
Potassium: 4 mEq/L (ref 3.5–5.1)
Sodium: 139 mEq/L (ref 135–145)
Total Bilirubin: 0.4 mg/dL (ref 0.2–1.2)
Total Protein: 7.2 g/dL (ref 6.0–8.3)

## 2017-09-11 LAB — CBC
HCT: 35.1 % — ABNORMAL LOW (ref 39.0–52.0)
Hemoglobin: 11.6 g/dL — ABNORMAL LOW (ref 13.0–17.0)
MCHC: 33.1 g/dL (ref 30.0–36.0)
MCV: 83.5 fl (ref 78.0–100.0)
Platelets: 176 10*3/uL (ref 150.0–400.0)
RBC: 4.21 Mil/uL — ABNORMAL LOW (ref 4.22–5.81)
RDW: 14.7 % (ref 11.5–15.5)
WBC: 3.7 10*3/uL — ABNORMAL LOW (ref 4.0–10.5)

## 2017-09-11 LAB — PSA: PSA: 4.56 ng/mL — ABNORMAL HIGH (ref 0.10–4.00)

## 2017-09-11 NOTE — Assessment & Plan Note (Signed)
Taking keppra BID, no seizures.

## 2017-09-11 NOTE — Assessment & Plan Note (Signed)
Checking CBC.  °

## 2017-09-11 NOTE — Assessment & Plan Note (Signed)
Flu shot yearly. Pneumonia complete. Shingrix declines. Tetanus up to date. Colonoscopy up to date. Counseled about sun safety and mole surveillance. Counseled about the dangers of distracted driving. Given 10 year screening recommendations.

## 2017-09-11 NOTE — Progress Notes (Signed)
   Subjective:    Patient ID: Evan Escobar, male    DOB: 22-Feb-1940, 77 y.o.   MRN: [id number]  HPI Here for medicare wellness and physical, no new complaints. Please see A/P for status and treatment of chronic medical problems.   Diet: heart healthy Physical activity: sedentary Depression/mood screen: negative Hearing: intact to whispered voice, mild loss bilaterally Visual acuity: grossly normal with lens, performs annual eye exam  ADLs: capable Fall risk: low Home safety: good Cognitive evaluation: intact to orientation, naming, recall and repetition EOL planning: adv directives discussed, not in place information given  I have personally reviewed and have noted 1. The patient's medical and social history - reviewed today no changes 2. Their use of alcohol, tobacco or illicit drugs 3. Their current medications and supplements 4. The patient's functional ability including ADL's, fall risks, home safety risks and hearing or visual impairment. 5. Diet and physical activities 6. Evidence for depression or mood disorders 7. Care team reviewed and updated (available in snapshot)  Review of Systems  Constitutional: Negative.   HENT: Negative.   Eyes: Negative.   Respiratory: Negative for cough, chest tightness and shortness of breath.   Cardiovascular: Negative for chest pain, palpitations and leg swelling.  Gastrointestinal: Negative for abdominal distention, abdominal pain, constipation, diarrhea, nausea and vomiting.  Musculoskeletal: Positive for arthralgias and neck pain.       Stable  Skin: Negative.   Neurological: Positive for numbness. Negative for dizziness, tremors, seizures, facial asymmetry, speech difficulty and headaches.       Stable, from neck problems  Psychiatric/Behavioral: Negative.       Objective:   Physical Exam  Constitutional: He is oriented to person, place, and time. He appears well-developed and well-nourished.  HENT:  Head: Normocephalic and  atraumatic.  Eyes: EOM are normal.  Neck: Normal range of motion.  Cardiovascular: Normal rate and regular rhythm.  Pulmonary/Chest: Effort normal and breath sounds normal. No respiratory distress. He has no wheezes. He has no rales.  Abdominal: Soft. Bowel sounds are normal. He exhibits no distension. There is no tenderness. There is no rebound.  Musculoskeletal: He exhibits no edema.  Neurological: He is alert and oriented to person, place, and time. Coordination normal.  Skin: Skin is warm and dry.  Psychiatric: He has a normal mood and affect.   Vitals:   09/11/17 0913  BP: 118/80  Pulse: (!) 56  Temp: 97.6 F (36.4 C)  TempSrc: Oral  SpO2: 99%  Weight: 146 lb (66.2 kg)  Height: 5\' 7"  (1.702 m)      Assessment & Plan:

## 2017-09-11 NOTE — Patient Instructions (Signed)
We will check the labs today.   Health Maintenance, Male A healthy lifestyle and preventive care is important for your health and wellness. Ask your health care provider about what schedule of regular examinations is right for you. What should I know about weight and diet? Eat a Healthy Diet  Eat plenty of vegetables, fruits, whole grains, low-fat dairy products, and lean protein.  Do not eat a lot of foods high in solid fats, added sugars, or salt.  Maintain a Healthy Weight Regular exercise can help you achieve or maintain a healthy weight. You should:  Do at least 150 minutes of exercise each week. The exercise should increase your heart rate and make you sweat (moderate-intensity exercise).  Do strength-training exercises at least twice a week.  Watch Your Levels of Cholesterol and Blood Lipids  Have your blood tested for lipids and cholesterol every 5 years starting at 76 years of age. If you are at high risk for heart disease, you should start having your blood tested when you are 76 years old. You may need to have your cholesterol levels checked more often if: ? Your lipid or cholesterol levels are high. ? You are older than 76 years of age. ? You are at high risk for heart disease.  What should I know about cancer screening? Many types of cancers can be detected early and may often be prevented. Lung Cancer  You should be screened every year for lung cancer if: ? You are a current smoker who has smoked for at least 30 years. ? You are a former smoker who has quit within the past 15 years.  Talk to your health care provider about your screening options, when you should start screening, and how often you should be screened.  Colorectal Cancer  Routine colorectal cancer screening usually begins at 76 years of age and should be repeated every 5-10 years until you are 76 years old. You may need to be screened more often if early forms of precancerous polyps or small growths  are found. Your health care provider may recommend screening at an earlier age if you have risk factors for colon cancer.  Your health care provider may recommend using home test kits to check for hidden blood in the stool.  A small camera at the end of a tube can be used to examine your colon (sigmoidoscopy or colonoscopy). This checks for the earliest forms of colorectal cancer.  Prostate and Testicular Cancer  Depending on your age and overall health, your health care provider may do certain tests to screen for prostate and testicular cancer.  Talk to your health care provider about any symptoms or concerns you have about testicular or prostate cancer.  Skin Cancer  Check your skin from head to toe regularly.  Tell your health care provider about any new moles or changes in moles, especially if: ? There is a change in a mole's size, shape, or color. ? You have a mole that is larger than a pencil eraser.  Always use sunscreen. Apply sunscreen liberally and repeat throughout the day.  Protect yourself by wearing long sleeves, pants, a wide-brimmed hat, and sunglasses when outside.  What should I know about heart disease, diabetes, and high blood pressure?  If you are 16-67 years of age, have your blood pressure checked every 3-5 years. If you are 26 years of age or older, have your blood pressure checked every year. You should have your blood pressure measured twice-once when you are  at a hospital or clinic, and once when you are not at a hospital or clinic. Record the average of the two measurements. To check your blood pressure when you are not at a hospital or clinic, you can use: ? An automated blood pressure machine at a pharmacy. ? A home blood pressure monitor.  Talk to your health care provider about your target blood pressure.  If you are between 62-65 years old, ask your health care provider if you should take aspirin to prevent heart disease.  Have regular diabetes  screenings by checking your fasting blood sugar level. ? If you are at a normal weight and have a low risk for diabetes, have this test once every three years after the age of 52. ? If you are overweight and have a high risk for diabetes, consider being tested at a younger age or more often.  A one-time screening for abdominal aortic aneurysm (AAA) by ultrasound is recommended for men aged 80-75 years who are current or former smokers. What should I know about preventing infection? Hepatitis B If you have a higher risk for hepatitis B, you should be screened for this virus. Talk with your health care provider to find out if you are at risk for hepatitis B infection. Hepatitis C Blood testing is recommended for:  Everyone born from [birth year] through 1965.  Anyone with known risk factors for hepatitis C.  Sexually Transmitted Diseases (STDs)  You should be screened each year for STDs including gonorrhea and chlamydia if: ? You are sexually active and are younger than 76 years of age. ? You are older than 76 years of age and your health care provider tells you that you are at risk for this type of infection. ? Your sexual activity has changed since you were last screened and you are at an increased risk for chlamydia or gonorrhea. Ask your health care provider if you are at risk.  Talk with your health care provider about whether you are at high risk of being infected with HIV. Your health care provider may recommend a prescription medicine to help prevent HIV infection.  What else can I do?  Schedule regular health, dental, and eye exams.  Stay current with your vaccines (immunizations).  Do not use any tobacco products, such as cigarettes, chewing tobacco, and e-cigarettes. If you need help quitting, ask your health care provider.  Limit alcohol intake to no more than 2 drinks per day. One drink equals 12 ounces of beer, 5 ounces of wine, or 1 ounces of hard liquor.  Do not use street  drugs.  Do not share needles.  Ask your health care provider for help if you need support or information about quitting drugs.  Tell your health care provider if you often feel depressed.  Tell your health care provider if you have ever been abused or do not feel safe at home. This information is not intended to replace advice given to you by your health care provider. Make sure you discuss any questions you have with your health care provider. Document Released: 08/02/2007 Document Revised: 10/03/2015 Document Reviewed: 11/07/2014 Elsevier Interactive Patient Education  [employer].

## 2017-09-11 NOTE — Assessment & Plan Note (Signed)
Taking lisinopril 10 mg daily, checking CMP and adjust as needed.  

## 2017-09-11 NOTE — Assessment & Plan Note (Signed)
Taking flomax, checking PSA today.

## 2017-09-30 ENCOUNTER — Other Ambulatory Visit: Payer: Self-pay | Admitting: Nurse Practitioner

## 2017-09-30 DIAGNOSIS — I10 Essential (primary) hypertension: Secondary | ICD-10-CM

## 2017-10-06 ENCOUNTER — Encounter: Payer: Self-pay | Admitting: Internal Medicine

## 2017-10-06 ENCOUNTER — Ambulatory Visit (INDEPENDENT_AMBULATORY_CARE_PROVIDER_SITE_OTHER): Payer: Medicare Other | Admitting: Internal Medicine

## 2017-10-06 VITALS — BP 138/80 | HR 71 | Temp 97.7°F | Ht 67.0 in | Wt 144.0 lb

## 2017-10-06 DIAGNOSIS — M79644 Pain in right finger(s): Secondary | ICD-10-CM | POA: Insufficient documentation

## 2017-10-06 DIAGNOSIS — M79641 Pain in right hand: Secondary | ICD-10-CM | POA: Diagnosis not present

## 2017-10-06 MED ORDER — DICLOFENAC SODIUM 1 % TD GEL: 2.0000 g | Freq: Four times a day (QID) | TRANSDERMAL | 3 refills | Status: DC

## 2017-10-06 NOTE — Progress Notes (Signed)
   Subjective:    Patient ID: Evan Escobar, male    DOB: January 19, 1941, 77 y.o.   MRN: [id number]  HPI The patient is a 77 YO man coming in for right hand pain. Started several months ago. Denies injury or overuse. Denies numbness or weakness in the hand. Overall it is stable since onset. The middle finger hurts the most and there is a nodule on it. There is a sharp pain when he tries to move it. Has tried tylenol which does not help much. Pain is 76/10 all the time and 7/10 when bending the finger.    Review of Systems  Constitutional: Negative.   Respiratory: Negative for cough, chest tightness and shortness of breath.   Cardiovascular: Negative for chest pain, palpitations and leg swelling.  Gastrointestinal: Negative for abdominal distention, abdominal pain, constipation, diarrhea, nausea and vomiting.  Musculoskeletal: Positive for arthralgias and myalgias.  Skin: Negative.   Neurological: Negative.   Psychiatric/Behavioral: Negative.       Objective:   Physical Exam  Constitutional: He is oriented to person, place, and time. He appears well-developed and well-nourished.  HENT:  Head: Normocephalic and atraumatic.  Eyes: EOM are normal.  Neck: Normal range of motion.  Cardiovascular: Normal rate and regular rhythm.  Pulmonary/Chest: Effort normal and breath sounds normal. No respiratory distress. He has no wheezes. He has no rales.  Musculoskeletal: He exhibits tenderness. He exhibits no edema.  Pain in the 3rd finger right hand with flexing or bending, nodule on the lateral aspect proximally. No numbness and vascular intact.   Neurological: He is alert and oriented to person, place, and time. Coordination normal.  Skin: Skin is warm and dry.   Vitals:   10/06/17 0759  BP: 138/80  Pulse: 71  Temp: 97.7 F (36.5 C)  TempSrc: Oral  SpO2: 97%  Weight: 144 lb (65.3 kg)  Height: 5\' 7"  (1.702 m)      Assessment & Plan:

## 2017-10-06 NOTE — Patient Instructions (Signed)
We have sent in a gel medicine to use on the hand to help with the pain.   We will get you in with a hand specialist.   If you have any saline eye drops at home you can use these in the ear for the pain to help.

## 2017-10-06 NOTE — Assessment & Plan Note (Signed)
Suspect the nodule is causing pain with moving the tendons and referral to hand surgery for injection or other treatment. Rx for voltaren gel to try in the meantime.

## 2017-10-14 ENCOUNTER — Other Ambulatory Visit: Payer: Self-pay

## 2017-10-14 ENCOUNTER — Ambulatory Visit (HOSPITAL_COMMUNITY)
Admission: EM | Admit: 2017-10-14 | Discharge: 2017-10-14 | Disposition: A | Discharge status: Home / Self Care | Payer: Medicare Other | Attending: Family Medicine | Admitting: Family Medicine

## 2017-10-14 ENCOUNTER — Encounter (HOSPITAL_COMMUNITY): Payer: Self-pay | Admitting: Emergency Medicine

## 2017-10-14 DIAGNOSIS — H6591 Unspecified nonsuppurative otitis media, right ear: Secondary | ICD-10-CM

## 2017-10-14 MED ORDER — AMOXICILLIN 875 MG PO TABS: 875.0000 mg | ORAL_TABLET | Freq: Two times a day (BID) | ORAL | 0 refills | Status: AC

## 2017-10-14 NOTE — ED Provider Notes (Signed)
[facility]   [id number] 10/14/17 Arrival Time: 6333  ASSESSMENT & PLAN:  1. Otitis media with effusion, right     Meds ordered this encounter  Medications  . amoxicillin (AMOXIL) 875 MG tablet    Sig: Take 1 tablet (875 mg total) by mouth 2 (two) times daily for 10 days.    Dispense:  20 tablet    Refill:  0   Discussed typical duration of symptoms. OTC symptom care as needed.  Follow-up Information    [name], MD.   Specialty:  Internal Medicine Why:  As needed or if you are not seeing improvement over the next several days. Contact information: [address] [place] [address] [phone]          Reviewed expectations re: course of current medical issues. Questions answered. Outlined signs and symptoms indicating need for more acute intervention. Patient verbalized understanding. After Visit Summary given.   SUBJECTIVE: History from: patient.  Nijah Orlich is a 77 y.o. male who presents with complaint of right otalgia without drainage. Onset gradual, over the past week. Recent cold symptoms: mild congestion. Fever: no. Overall normal PO intake without n/v. Sick contacts: no. OTC treatment: none.   Social History   Tobacco Use  Smoking Status Former Smoker  . Packs/day: 1.00  . Types: Cigarettes  . Last attempt to quit: 02/18/2007  . Years since quitting: 10.6  Smokeless Tobacco Never Used  Tobacco Comment   not sure but a while ago; quit smoking and ETOH    ROS: As per HPI.   OBJECTIVE:  Vitals:   10/14/17 1009  BP: (!) 146/67  Pulse: 65  Resp: 20  Temp: (!) 97.4 F (36.3 C)  TempSrc: Oral  SpO2: 100%    General appearance: alert; appears fatigued Ear Canal: normal TM: right: yellow, bulging with yellowish fluid behind TM Neck: supple without LAD Lungs: unlabored respirations, symmetrical air entry; cough: absent; no respiratory distress Skin: warm and dry Psychological: alert and cooperative; normal  mood and affect  No Known Allergies  Past Medical History:  Diagnosis Date  . Allergy   . Arthritis   . Cataract   . Glaucoma   . Hypertension   . Normocytic anemia 09/25/2012  . Polyp, sigmoid colon 11/05/2011  . Seizure ([place])   . Seizures ([place]) 03/17/2013   Family History  Problem Relation Age of Onset  . Stroke Mother   . High blood pressure Mother   . Stroke Father   . High blood pressure Father   . Colon cancer Brother   . Esophageal cancer Neg Hx   . Pancreatic cancer Neg Hx   . Rectal cancer Neg Hx   . Stomach cancer Neg Hx    Social History   Socioeconomic History  . Marital status: Married    Spouse name: Terrence Dupont  . Number of children: 1  . Years of education: 66  . Highest education level: Not on file  Occupational History  . Occupation: retired  Scientific laboratory technician  . Financial resource strain: Not on file  . Food insecurity:    Worry: Not on file    Inability: Not on file  . Transportation needs:    Medical: Not on file    Non-medical: Not on file  Tobacco Use  . Smoking status: Former Smoker    Packs/day: 1.00    Types: Cigarettes    Last attempt to quit: 02/18/2007    Years since quitting: 10.6  . Smokeless tobacco:  Never Used  . Tobacco comment: not sure but a while ago; quit smoking and ETOH  Substance and Sexual Activity  . Alcohol use: No    Alcohol/week: 0.0 standard drinks    Comment: quit in 2009  . Drug use: No  . Sexual activity: Not on file  Lifestyle  . Physical activity:    Days per week: Not on file    Minutes per session: Not on file  . Stress: Not on file  Relationships  . Social connections:    Talks on phone: Not on file    Gets together: Not on file    Attends religious service: Not on file    Active member of club or organization: Not on file    Attends meetings of clubs or organizations: Not on file    Relationship status: Not on file  . Intimate partner violence:    Fear of current or ex partner: Not on file    Emotionally  abused: Not on file    Physically abused: Not on file    Forced sexual activity: Not on file  Other Topics Concern  . Not on file  Social History Narrative   Patient lives at home with his wife Terrence Dupont). Patient is retired. Patient has 11 th grade education.    Caffeine- one cup of coffee daily and one soda.   Right handed.            Vanessa Kick, MD 10/17/17 1014

## 2017-10-14 NOTE — Medical Student Note (Signed)
[facility] Statistician Note For educational purposes for Medical, PA and NP students only and not part of the legal medical record.   CSN: [id number] Arrival date & time: 10/14/17  4098     History   Chief Complaint Chief Complaint  Patient presents with  . Otalgia    HPI Evan Escobar is a 77 y.o. male presents today with complaints of right ear pain for one week with a fluctuating coarse. He complains of difficulty hearing out his right ear even with his hearing aid. He describes his ear as feeling "stuffy" and something is lodged within the ear. He identifies no aggravating or relieving factors. Denies congestion, sore throat, or tenderness to external ear. He states the otalgia is very uncomfortable and has had to stop using his hearing aids due to the issue.   HPI    Past Medical History:  Diagnosis Date  . Allergy   . Arthritis   . Cataract   . Glaucoma   . Hypertension   . Normocytic anemia 09/25/2012  . Polyp, sigmoid colon 11/05/2011  . Seizure ([place])   . Seizures ([place]) 03/17/2013    Patient Active Problem List   Diagnosis Date Noted  . Right hand pain 10/06/2017  . Myelopathy ([place]) 05/22/2017  . Numbness and tingling in left arm 02/03/2017  . Hearing loss 09/09/2016  . Benign prostatic hyperplasia 08/28/2015  . Routine general medical examination at a health care facility 08/28/2015  . Allergic rhinitis 02/26/2015  . Constipation 04/26/2014  . Glaucoma 05/07/2013  . Essential hypertension, benign 05/07/2013  . Seizures ([place]) 03/17/2013  . Normocytic anemia 09/25/2012    Past Surgical History:  Procedure Laterality Date  . ANTERIOR CERVICAL DECOMP/DISCECTOMY FUSION N/A 05/22/2017   Procedure: Anterior Cervical Discectomy Fusion - Cervical Three-Cervical Four - Cervical Four-Cervical Five;  Surgeon: Kary Kos, MD;  Location: [facility];  Service: Neurosurgery;  Laterality: N/A;  . CATARACT EXTRACTION, BILATERAL    . EYE SURGERY    . GLAUCOMA  REPAIR  10 yrs ago       Home Medications    Prior to Admission medications   Medication Sig Start Date End Date Taking? Authorizing Provider  acetaminophen (TYLENOL) 500 MG tablet Take 500 mg by mouth every 6 (six) hours as needed for moderate pain or headache.     [provider]  amoxicillin (AMOXIL) 875 MG tablet Take 1 tablet (875 mg total) by mouth 2 (two) times daily for 10 days. 10/14/17 10/24/17  Vanessa Kick, MD  aspirin 81 MG tablet Take 81 mg by mouth daily.    [provider]  brimonidine (ALPHAGAN P) 0.1 % SOLN Place 1 drop 3 (three) times daily into both eyes.    [provider]  cycloSPORINE (RESTASIS) 0.05 % ophthalmic emulsion Place 1 drop into both eyes 2 (two) times daily.    [provider]  diclofenac (VOLTAREN) 75 MG EC tablet Take 75 mg by mouth 2 (two) times daily.    [provider]  diclofenac sodium (VOLTAREN) 1 % GEL Apply 2 g topically 4 (four) times daily. 10/06/17   [name], MD  latanoprost (XALATAN) 0.005 % ophthalmic solution Place 1 drop into both eyes at bedtime.  09/20/12   [provider]  levETIRAcetam (KEPPRA) 500 MG tablet Take 1 tablet (500 mg total) by mouth 2 (two) times daily. 07/15/17   [name], NP  lisinopril (PRINIVIL,ZESTRIL) 10 MG tablet TAKE 1 TABLET(10 MG) BY MOUTH DAILY  09/30/17   [name], MD  magnesium hydroxide (MILK OF MAGNESIA) 400 MG/5ML suspension Take 30 mLs by mouth daily as needed for mild constipation.    [provider]  Multiple Vitamin (MULTIVITAMIN) tablet Take 1 tablet by mouth daily.    [provider]  tamsulosin (FLOMAX) 0.4 MG CAPS capsule Take 0.4 mg daily by mouth.    [provider]    Family History Family History  Problem Relation Age of Onset  . Stroke Mother   . High blood pressure Mother   . Stroke Father   . High blood pressure Father   . Colon cancer Brother   . Esophageal cancer Neg Hx     . Pancreatic cancer Neg Hx   . Rectal cancer Neg Hx   . Stomach cancer Neg Hx     Social History Social History   Tobacco Use  . Smoking status: Former Smoker    Packs/day: 1.00    Types: Cigarettes    Last attempt to quit: 02/18/2007    Years since quitting: 10.6  . Smokeless tobacco: Never Used  . Tobacco comment: not sure but a while ago; quit smoking and ETOH  Substance Use Topics  . Alcohol use: No    Alcohol/week: 0.0 standard drinks    Comment: quit in 2009  . Drug use: No     Allergies   Patient has no known allergies.   Review of Systems Review of Systems  Constitutional: Negative for fever.  HENT: Positive for ear pain and hearing loss. Negative for congestion, ear discharge, sinus pressure, sinus pain, sore throat and tinnitus.        Ear feels like something is lodged within it.   Neurological: Positive for headaches. Negative for dizziness and syncope.       Has have been occurring since his surgery to fix his pinched nerve a few months ago.      Physical Exam Updated Vital Signs BP (!) 146/67 (BP Location: Left Arm)   Pulse 65   Temp (!) 97.4 F (36.3 C) (Oral)   Resp 20   SpO2 100%   Physical Exam  Constitutional: He is oriented to person, place, and time. He appears well-developed and well-nourished.  HENT:  Right Ear: External ear normal. No lacerations. There is tenderness. No drainage or swelling. No foreign bodies. No mastoid tenderness. Tympanic membrane is erythematous. Tympanic membrane is not perforated and not bulging. A middle ear effusion is present. Decreased hearing is noted.  Left Ear: Hearing, tympanic membrane, external ear and ear canal normal.  Ears:  Neurological: He is alert and oriented to person, place, and time.  Skin: Skin is warm and dry.  Nursing note and vitals reviewed.    ED Treatments / Results  Labs (all labs ordered are listed, but only abnormal results are displayed) Labs Reviewed - No data to  display  EKG None Radiology No results found.  Procedures Procedures (including critical care time)  Medications Ordered in ED Medications - No data to display   Initial Impression / Assessment and Plan / ED Course  I have reviewed the triage vital signs and the nursing notes.  Pertinent labs & imaging results that were available during my care of the patient were reviewed by me and considered in my medical decision making (see chart for details).  Otitis Media with Effusion, Right Amoxicillin 850 mg BID for 10 days  Restart hearing aid use as able without pain  FU with PCP  if no improvement within the next 5 days     Final Clinical Impressions(s) / ED Diagnoses   Final diagnoses:  Otitis media with effusion, right    New Prescriptions Discharge Medication List as of 10/14/2017 11:07 AM    START taking these medications   Details  amoxicillin (AMOXIL) 875 MG tablet Take 1 tablet (875 mg total) by mouth 2 (two) times daily for 10 days., Starting Wed 10/14/2017, Until Sat 10/24/2017, Normal

## 2017-10-14 NOTE — ED Triage Notes (Signed)
Right ear pain, stuffiness, patient reports he has head congestion/stuffiness.

## 2017-10-28 DIAGNOSIS — M65331 Trigger finger, right middle finger: Secondary | ICD-10-CM | POA: Diagnosis not present

## 2017-10-28 DIAGNOSIS — M79641 Pain in right hand: Secondary | ICD-10-CM | POA: Diagnosis not present

## 2017-10-29 ENCOUNTER — Ambulatory Visit (INDEPENDENT_AMBULATORY_CARE_PROVIDER_SITE_OTHER): Payer: Medicare Other | Admitting: Internal Medicine

## 2017-10-29 ENCOUNTER — Encounter: Payer: Self-pay | Admitting: Internal Medicine

## 2017-10-29 DIAGNOSIS — H9201 Otalgia, right ear: Secondary | ICD-10-CM | POA: Diagnosis not present

## 2017-10-29 MED ORDER — NEOMYCIN-POLYMYXIN-HC 3.5-10000-1 OT SUSP: 3.0000 [drp] | Freq: Three times a day (TID) | OTIC | 0 refills | Status: DC

## 2017-10-29 NOTE — Assessment & Plan Note (Signed)
Some increased fluid in the ear but no signs of infection. Rx for cortisporin ear drops. Advised to start zyrtec for likely allergic rhinitis.

## 2017-10-29 NOTE — Patient Instructions (Signed)
We have sent in the ear drops to use on the right ear. Use 3 drops in the right ear, 3 times a day for 5 days.   Also start taking zyrtec (cetirizine) over the counter. You can ask the pharmacist if you cannot find it they can help you.

## 2017-10-29 NOTE — Progress Notes (Signed)
   Subjective:    Patient ID: Evan Escobar, male    DOB: 03/31/1940, 77 y.o.   MRN: [id number]  HPI The patient is a 77 YO man coming in for congestion and ear pain. Started about 2-3 weeks ago. Associated with ear pain, hearing loss and echoing. Has tried going to urgent care and was given antibiotic for ear infection. Denies fevers or chills. Denies left ear pain. Is having some nose congestion and drainage. Mild cough which is not productive. Overall it is stable but not improving. The antibiotics did not help.   Review of Systems  Constitutional: Positive for activity change. Negative for appetite change, chills, fatigue, fever and unexpected weight change.  HENT: Positive for congestion, ear pain and rhinorrhea. Negative for ear discharge, postnasal drip, sinus pressure, sinus pain, sneezing, sore throat, tinnitus, trouble swallowing and voice change.   Eyes: Negative.   Respiratory: Positive for cough. Negative for chest tightness, shortness of breath and wheezing.   Cardiovascular: Negative.   Gastrointestinal: Negative.   Musculoskeletal: Positive for myalgias.  Neurological: Negative.       Objective:   Physical Exam  Constitutional: He is oriented to person, place, and time. He appears well-developed and well-nourished.  HENT:  Head: Normocephalic and atraumatic.  Oropharynx with redness and clear drainage, nose with swollen turbinates, TMs bulging on the right, TM normal left.  Eyes: EOM are normal.  Neck: Normal range of motion. No thyromegaly present.  Cardiovascular: Normal rate and regular rhythm.  Pulmonary/Chest: Effort normal and breath sounds normal. No respiratory distress. He has no wheezes. He has no rales.  Abdominal: Soft. He exhibits no distension. There is no tenderness. There is no rebound.  Musculoskeletal: He exhibits no edema or tenderness.  Lymphadenopathy:    He has no cervical adenopathy.  Neurological: He is alert and oriented to person, place, and  time. Coordination normal.  Skin: Skin is warm and dry.  Psychiatric: He has a normal mood and affect.   Vitals:   10/29/17 0756  BP: 140/80  Pulse: 66  Temp: 97.8 F (36.6 C)  TempSrc: Oral  SpO2: 99%  Weight: 146 lb (66.2 kg)  Height: 5\' 7"  (1.702 m)      Assessment & Plan:

## 2017-11-03 DIAGNOSIS — H401133 Primary open-angle glaucoma, bilateral, severe stage: Secondary | ICD-10-CM | POA: Diagnosis not present

## 2017-11-06 ENCOUNTER — Ambulatory Visit (HOSPITAL_COMMUNITY)
Admission: EM | Admit: 2017-11-06 | Discharge: 2017-11-06 | Disposition: A | Discharge status: Home / Self Care | Payer: Medicare Other | Attending: Family Medicine | Admitting: Family Medicine

## 2017-11-06 ENCOUNTER — Encounter (HOSPITAL_COMMUNITY): Payer: Self-pay | Admitting: Emergency Medicine

## 2017-11-06 DIAGNOSIS — M79652 Pain in left thigh: Secondary | ICD-10-CM

## 2017-11-06 DIAGNOSIS — M79651 Pain in right thigh: Secondary | ICD-10-CM

## 2017-11-06 DIAGNOSIS — R252 Cramp and spasm: Secondary | ICD-10-CM | POA: Diagnosis not present

## 2017-11-06 LAB — POCT I-STAT, CHEM 8
BUN: 17 mg/dL (ref 8–23)
Calcium, Ion: 1.22 mmol/L (ref 1.15–1.40)
Chloride: 103 mmol/L (ref 98–111)
Creatinine, Ser: 1 mg/dL (ref 0.61–1.24)
Glucose, Bld: 89 mg/dL (ref 70–99)
HCT: 38 % — ABNORMAL LOW (ref 39.0–52.0)
Hemoglobin: 12.9 g/dL — ABNORMAL LOW (ref 13.0–17.0)
Potassium: 4.2 mmol/L (ref 3.5–5.1)
Sodium: 138 mmol/L (ref 135–145)
TCO2: 27 mmol/L (ref 22–32)

## 2017-11-06 NOTE — ED Provider Notes (Signed)
[facility]    CSN: [id number] Arrival date & time: 11/06/17  5784     History   Chief Complaint Chief Complaint  Patient presents with  . Leg Pain    HPI Evan Escobar is a 77 y.o. male.   She is a 77 year old male that presents with 3 days of cramping in legs.  Reports that it has been intermittent.  The cramping seems to get worse at night.  He has been taking mustard with some relief.  The pain has subsided in the right leg but now he is having left upper thigh cramping.  He denies any calf pain, swelling, warmness.  He denies any fever, chills.  He reports that he gets constipated at times and takes mag citrate and milk of magnesia.  He took this last week and caused him to have diarrhea all day. He denies any falls or injuries. He does not take any cholesterol medications.    Leg Pain  Location:  Leg Leg location:  R upper leg and L upper leg Pain details:    Quality:  Cramping   Radiates to:  Back   Severity:  Mild   Onset quality:  Gradual   Duration:  3 days   Timing:  Intermittent   Progression:  Waxing and waning Chronicity:  New Associated symptoms: no fatigue and no fever     Past Medical History:  Diagnosis Date  . Allergy   . Arthritis   . Cataract   . Glaucoma   . Hypertension   . Normocytic anemia 09/25/2012  . Polyp, sigmoid colon 11/05/2011  . Seizure ([place])   . Seizures ([place]) 03/17/2013    Patient Active Problem List   Diagnosis Date Noted  . Right ear pain 10/29/2017  . Right hand pain 10/06/2017  . Myelopathy ([place]) 05/22/2017  . Numbness and tingling in left arm 02/03/2017  . Hearing loss 09/09/2016  . Benign prostatic hyperplasia 08/28/2015  . Routine general medical examination at a health care facility 08/28/2015  . Allergic rhinitis 02/26/2015  . Constipation 04/26/2014  . Glaucoma 05/07/2013  . Essential hypertension, benign 05/07/2013  . Seizures ([place]) 03/17/2013  . Normocytic anemia 09/25/2012    Past Surgical  History:  Procedure Laterality Date  . ANTERIOR CERVICAL DECOMP/DISCECTOMY FUSION N/A 05/22/2017   Procedure: Anterior Cervical Discectomy Fusion - Cervical Three-Cervical Four - Cervical Four-Cervical Five;  Surgeon: Kary Kos, MD;  Location: [facility];  Service: Neurosurgery;  Laterality: N/A;  . CATARACT EXTRACTION, BILATERAL    . EYE SURGERY    . GLAUCOMA REPAIR  10 yrs ago       Home Medications    Prior to Admission medications   Medication Sig Start Date End Date Taking? Authorizing Provider  acetaminophen (TYLENOL) 500 MG tablet Take 500 mg by mouth every 6 (six) hours as needed for moderate pain or headache.     [provider]  aspirin 81 MG tablet Take 81 mg by mouth daily.    [provider]  brimonidine (ALPHAGAN P) 0.1 % SOLN Place 1 drop 3 (three) times daily into both eyes.    [provider]  cycloSPORINE (RESTASIS) 0.05 % ophthalmic emulsion Place 1 drop into both eyes 2 (two) times daily.    [provider]  diclofenac (VOLTAREN) 75 MG EC tablet Take 75 mg by mouth 2 (two) times daily.    [provider]  diclofenac sodium (VOLTAREN) 1 % GEL Apply 2 g topically 4 (four) times daily.  10/06/17   [name], MD  latanoprost (XALATAN) 0.005 % ophthalmic solution Place 1 drop into both eyes at bedtime.  09/20/12   [provider]  levETIRAcetam (KEPPRA) 500 MG tablet Take 1 tablet (500 mg total) by mouth 2 (two) times daily. 07/15/17   [name], NP  lisinopril (PRINIVIL,ZESTRIL) 10 MG tablet TAKE 1 TABLET(10 MG) BY MOUTH DAILY 09/30/17   [name], MD  magnesium hydroxide (MILK OF MAGNESIA) 400 MG/5ML suspension Take 30 mLs by mouth daily as needed for mild constipation.    [provider]  Multiple Vitamin (MULTIVITAMIN) tablet Take 1 tablet by mouth daily.    [provider]  neomycin-polymyxin-hydrocortisone (CORTISPORIN) 3.5-10000-1 OTIC suspension Place 3 drops into the  right ear 3 (three) times daily. 10/29/17   [name], MD  tamsulosin (FLOMAX) 0.4 MG CAPS capsule Take 0.4 mg daily by mouth.    [provider]    Family History Family History  Problem Relation Age of Onset  . Stroke Mother   . High blood pressure Mother   . Stroke Father   . High blood pressure Father   . Colon cancer Brother   . Esophageal cancer Neg Hx   . Pancreatic cancer Neg Hx   . Rectal cancer Neg Hx   . Stomach cancer Neg Hx     Social History Social History   Tobacco Use  . Smoking status: Former Smoker    Packs/day: 1.00    Types: Cigarettes    Last attempt to quit: 02/18/2007    Years since quitting: 10.7  . Smokeless tobacco: Never Used  . Tobacco comment: not sure but a while ago; quit smoking and ETOH  Substance Use Topics  . Alcohol use: No    Alcohol/week: 0.0 standard drinks    Comment: quit in 2009  . Drug use: No     Allergies   Patient has no known allergies.   Review of Systems Review of Systems  Constitutional: Negative for activity change, appetite change, chills, diaphoresis, fatigue, fever and unexpected weight change.  Respiratory: Negative for shortness of breath.   Cardiovascular: Negative for chest pain and leg swelling.  Musculoskeletal: Positive for myalgias. Negative for joint swelling.  Skin: Negative for color change, pallor, rash and wound.  Neurological: Negative for weakness and numbness.     Physical Exam Triage Vital Signs ED Triage Vitals [11/06/17 1023]  Enc Vitals Group     BP (!) 155/69     Pulse Rate (!) 53     Resp 18     Temp (!) 97.5 F (36.4 C)     Temp src      SpO2 100 %     Weight      Height      Head Circumference      Peak Flow      Pain Score      Pain Loc      Pain Edu?      Excl. in [place]?    No data found.  Updated Vital Signs BP (!) 155/69   Pulse (!) 53   Temp (!) 97.5 F (36.4 C)   Resp 18   SpO2 100%   Visual Acuity Right Eye Distance:   Left Eye  Distance:   Bilateral Distance:    Right Eye Near:   Left Eye Near:    Bilateral Near:     Physical Exam  Constitutional: He is oriented to person, place, and  time. He appears well-developed and well-nourished.  Very pleasant. Non toxic or ill appearing.     HENT:  Head: Normocephalic and atraumatic.  Eyes: Conjunctivae are normal.  Neck: Normal range of motion.  Pulmonary/Chest: Effort normal.  Musculoskeletal: Normal range of motion. He exhibits no edema, tenderness or deformity.  Good ROM in bilateral LE. No calf swelling,pain,  increased warmth or redness in BLE.     Neurological: He is alert and oriented to person, place, and time.  Skin: Skin is warm and dry.  Psychiatric: He has a normal mood and affect.  Nursing note and vitals reviewed.    UC Treatments / Results  Labs (all labs ordered are listed, but only abnormal results are displayed) Labs Reviewed  POCT I-STAT, CHEM 8 - Abnormal; Notable for the following components:      Result Value   Hemoglobin 12.9 (*)    HCT 38.0 (*)    All other components within normal limits    EKG None  Radiology No results found.  Procedures Procedures (including critical care time)  Medications Ordered in UC Medications - No data to display  Initial Impression / Assessment and Plan / UC Course  I have reviewed the triage vital signs and the nursing notes.  Pertinent labs & imaging results that were available during my care of the patient were reviewed by me and considered in my medical decision making (see chart for details).     I stat chem 8 to check sodium and potassium levels.  I-STAT normal. Hemoglobin slightly decreased but patient has history of same No concern for DVT, no calf swelling, pain, increased redness or warmth.  Instructed pt on things to try to improve the cramps.  If they continue or get worse he needs to see his doctor.  Patient understanding and agreeable to plan Final Clinical  Impressions(s) / UC Diagnoses   Final diagnoses:  Muscle cramps     Discharge Instructions     It was nice meeting you!!  Your blood work was normal. You can may be try to increase your magnesium rich foods to include bananas, spinach, nuts to see if this helps. You can continue the mustard if this helps.  Try to take a walk and stretch the legs.  For continued or worsening symptoms follow up with your doctor.      ED Prescriptions    None     Controlled Substance Prescriptions [place] Controlled Substance Registry consulted? Not Applicable   Orvan July, NP 11/06/17 1138

## 2017-11-06 NOTE — ED Triage Notes (Signed)
Pt states a few days ago he had bilateral leg cramping, took some mustard which helped a lot, pt states now just his Left leg is hurting now.

## 2017-11-06 NOTE — Discharge Instructions (Addendum)
It was nice meeting you!!  Your blood work was normal. You can may be try to increase your magnesium rich foods to include bananas, spinach, nuts to see if this helps. You can continue the mustard if this helps.  Try to take a walk and stretch the legs.  For continued or worsening symptoms follow up with your doctor.

## 2017-11-09 ENCOUNTER — Ambulatory Visit: Payer: Self-pay

## 2017-11-09 NOTE — Telephone Encounter (Signed)
Returned call to pt.  Reported onset of cramping in the left thigh for about 8 days; reported started on Sun., 9/15.  Went to UC on Friday, 9/20, but didn't feel they helped him.  Continues to c/o ongoing cramping of left thigh from left hip to the post. Left thigh.  Stated the cramping is present most of the time, and it worsens when he gets up to walk on it.  Denied any change in color or temperature of left leg.  Denied swelling of left LE.  Stated taking Tylenol 500 mg, 2 tabs prn for pain.  Reported has been trying to eat foods high in Magnesium, as instructed per MD @ UC.  Also, c/o intermittent pain in the muscle of left upper arm.  Stated he has had the left arm pain, on and off, for awhile, and mentioned it to the Neurosurgeon in a follow-up about 6 weeks.  Was instructed that it would take more time to heal.  Questioned about numbness or weakness of extremities?  Reported he has had numbness of the left index finger and left thumb, and that is part of the reason he had spinal surgery in April.  Also, reported that prior to surgery he had pain/numbness in tip of left index finger that went up to the neck and head and would travel down into the leg.  Per notes in UC, if patients cramps/ pain in legs don't improve, to see PCP.  Appt. Sched. With Nurse Practitioner on 9/24.  Care advice given per protocol.  Verb. Understanding.                Reason for Disposition . [1] MODERATE pain (e.g., interferes with normal activities, limping) AND [2] present > 3 days  Answer Assessment - Initial Assessment Questions 1. ONSET: "When did the pain start?"      One week ago on Sunday 2. LOCATION: "Where is the pain located?"      Left upper leg from left hip to the post. Left thigh 3. PAIN: "How bad is the pain?"    (Scale 1-10; or mild, moderate, severe)   -  MILD (1-3): doesn't interfere with normal activities    -  MODERATE (4-7): interferes with normal activities (e.g., work or school) or awakens from  sleep, limping    -  SEVERE (8-10): excruciating pain, unable to do any normal activities, unable to walk     3-4/10 4. WORK OR EXERCISE: "Has there been any recent work or exercise that involved this part of the body?"      no 5. CAUSE: "What do you think is causing the leg pain?"    Unknown 6. OTHER SYMPTOMS: "Do you have any other symptoms?" (e.g., chest pain, back pain, breathing difficulty, swelling, rash, fever, numbness, weakness)     Numbness in left index finger and thumb; part of reason for having spinal surgery. C/o cramping in left arm from shoulder down the arm with certain movement, and left leg cramping in the thigh with weight bearing; seen in Urgent Care on Friday, 9/20.  Protocols used: LEG PAIN-A-AH Message from [employer] sent at 11/09/2017 3:09 PM EDT   Summary: leg cramps   Pt states that he has had leg cramps - both legs, but now confined to left leg, since last Sunday. Pt states he went to Urgent Care but they didn't give him anything. Pt wants to know what he can take OTC to help with this.

## 2017-11-10 ENCOUNTER — Encounter: Payer: Self-pay | Admitting: Family

## 2017-11-10 ENCOUNTER — Other Ambulatory Visit (INDEPENDENT_AMBULATORY_CARE_PROVIDER_SITE_OTHER): Payer: Medicare Other

## 2017-11-10 ENCOUNTER — Other Ambulatory Visit: Payer: Self-pay | Admitting: Family

## 2017-11-10 ENCOUNTER — Ambulatory Visit (INDEPENDENT_AMBULATORY_CARE_PROVIDER_SITE_OTHER): Payer: Medicare Other | Admitting: Family

## 2017-11-10 ENCOUNTER — Ambulatory Visit (INDEPENDENT_AMBULATORY_CARE_PROVIDER_SITE_OTHER)
Admission: RE | Admit: 2017-11-10 | Discharge: 2017-11-10 | Disposition: A | Discharge status: Home / Self Care | Payer: Medicare Other | Source: Ambulatory Visit | Attending: Family | Admitting: Family

## 2017-11-10 VITALS — BP 150/68 | HR 58 | Temp 97.5°F | Ht 67.0 in | Wt 144.1 lb

## 2017-11-10 DIAGNOSIS — M5442 Lumbago with sciatica, left side: Secondary | ICD-10-CM

## 2017-11-10 DIAGNOSIS — M545 Low back pain: Secondary | ICD-10-CM | POA: Diagnosis not present

## 2017-11-10 DIAGNOSIS — M6281 Muscle weakness (generalized): Secondary | ICD-10-CM

## 2017-11-10 DIAGNOSIS — R972 Elevated prostate specific antigen [PSA]: Secondary | ICD-10-CM

## 2017-11-10 DIAGNOSIS — M5416 Radiculopathy, lumbar region: Secondary | ICD-10-CM

## 2017-11-10 LAB — PSA: PSA: 3.27 ng/mL (ref 0.10–4.00)

## 2017-11-10 LAB — MAGNESIUM: Magnesium: 2 mg/dL (ref 1.5–2.5)

## 2017-11-10 LAB — VITAMIN B12: Vitamin B-12: 1028 pg/mL — ABNORMAL HIGH (ref 211–911)

## 2017-11-10 MED ORDER — METHYLPREDNISOLONE 4 MG PO TBPK: ORAL_TABLET | ORAL | 0 refills | Status: DC

## 2017-11-10 NOTE — Progress Notes (Signed)
Evan Escobar is a 77 y.o. male with the following history as recorded in [employer]:  Patient Active Problem List   Diagnosis Date Noted  . Right ear pain 10/29/2017  . Right hand pain 10/06/2017  . Myelopathy ([place]) 05/22/2017  . Numbness and tingling in left arm 02/03/2017  . Hearing loss 09/09/2016  . Benign prostatic hyperplasia 08/28/2015  . Routine general medical examination at a health care facility 08/28/2015  . Allergic rhinitis 02/26/2015  . Constipation 04/26/2014  . Glaucoma 05/07/2013  . Essential hypertension, benign 05/07/2013  . Seizures ([place]) 03/17/2013  . Normocytic anemia 09/25/2012    Current Outpatient Medications  Medication Sig Dispense Refill  . acetaminophen (TYLENOL) 500 MG tablet Take 500 mg by mouth every 6 (six) hours as needed for moderate pain or headache.     Marland Kitchen aspirin 81 MG tablet Take 81 mg by mouth daily.    . Aspirin-Calcium Carbonate 81-777 MG TABS Take by mouth.    . brimonidine (ALPHAGAN P) 0.1 % SOLN Place 1 drop 3 (three) times daily into both eyes.    . cycloSPORINE (RESTASIS) 0.05 % ophthalmic emulsion Place 1 drop into both eyes 2 (two) times daily.    Marland Kitchen latanoprost (XALATAN) 0.005 % ophthalmic solution Place 1 drop into both eyes at bedtime.     . levETIRAcetam (KEPPRA) 500 MG tablet Take 1 tablet (500 mg total) by mouth 2 (two) times daily. 180 tablet 3  . lisinopril (PRINIVIL,ZESTRIL) 10 MG tablet TAKE 1 TABLET(10 MG) BY MOUTH DAILY 90 tablet 1  . magnesium hydroxide (MILK OF MAGNESIA) 400 MG/5ML suspension Take 30 mLs by mouth daily as needed for mild constipation.    . Multiple Vitamin (MULTIVITAMIN) tablet Take 1 tablet by mouth daily.    Marland Kitchen neomycin-polymyxin-hydrocortisone (CORTISPORIN) 3.5-10000-1 OTIC suspension Place 3 drops into the right ear 3 (three) times daily. 10 mL 0  . tamsulosin (FLOMAX) 0.4 MG CAPS capsule Take 0.4 mg daily by mouth.     No current facility-administered medications for this visit.     Allergies:  Patient has no known allergies.  Past Medical History:  Diagnosis Date  . Allergy   . Arthritis   . Cataract   . Glaucoma   . Hypertension   . Normocytic anemia 09/25/2012  . Polyp, sigmoid colon 11/05/2011  . Seizure ([place])   . Seizures ([place]) 03/17/2013    Past Surgical History:  Procedure Laterality Date  . ANTERIOR CERVICAL DECOMP/DISCECTOMY FUSION N/A 05/22/2017   Procedure: Anterior Cervical Discectomy Fusion - Cervical Three-Cervical Four - Cervical Four-Cervical Five;  Surgeon: Kary Kos, MD;  Location: [facility];  Service: Neurosurgery;  Laterality: N/A;  . CATARACT EXTRACTION, BILATERAL    . EYE SURGERY    . GLAUCOMA REPAIR  10 yrs ago    Family History  Problem Relation Age of Onset  . Stroke Mother   . High blood pressure Mother   . Stroke Father   . High blood pressure Father   . Colon cancer Brother   . Esophageal cancer Neg Hx   . Pancreatic cancer Neg Hx   . Rectal cancer Neg Hx   . Stomach cancer Neg Hx     Social History   Tobacco Use  . Smoking status: Former Smoker    Packs/day: 1.00    Types: Cigarettes    Last attempt to quit: 02/18/2007    Years since quitting: 10.7  . Smokeless tobacco: Never Used  . Tobacco comment: not sure but a while ago;  quit smoking and ETOH  Substance Use Topics  . Alcohol use: No    Alcohol/week: 0.0 standard drinks    Comment: quit in 2009    Subjective:  Patient presents with complaints of leg cramps x 1 week; went to the ER on 9/20 and had normal potassium level; has gotten some relief with mustard. Feels majority of pain is localized in left leg; feels like he does feel some weakness in the left leg when gets up to walk; history of arthritis in neck- surgery earlier this year; no new medications, no new supplements;   Objective:  Vitals:   11/10/17 0819  BP: (!) 150/68  Pulse: (!) 58  Temp: (!) 97.5 F (36.4 C)  TempSrc: Oral  SpO2: 99%  Weight: 144 lb 1.3 oz (65.4 kg)  Height: 5\' 7"  (1.702 m)    General: Well  developed, well nourished, in no acute distress  Skin : Warm and dry.  Head: Normocephalic and atraumatic  Lungs: Respirations unlabored; Musculoskeletal: No deformities; no active joint inflammation  Extremities: No edema, cyanosis, clubbing  Vessels: Symmetric bilaterally  Neurologic: Alert and oriented; speech intact; face symmetrical; uses cane for walking; CNII-XII intact without focal deficit   Assessment:  1. Muscle weakness   2. Acute left-sided low back pain with left-sided sciatica   3. Elevated PSA     Plan:  1. Check B12, magnesium today; 2. Update lumbar x-ray; patient is a difficult historian but he repeatedly explains that symptoms are present from his hip down into leg; update lumbar x-ray; follow-up to be determined. 3. Difficult to determine if patient actually followed up on elevated PSA last summer; update PSA today- may need to refer back to urology.   No follow-ups on file.  Orders Placed This Encounter  Procedures  . DG Lumbar Spine 2-3 Views    Standing Status:   Future    Standing Expiration Date:   01/11/2019    Order Specific Question:   Reason for Exam (SYMPTOM  OR DIAGNOSIS REQUIRED)    Answer:   low back pain/ left leg weakness    Order Specific Question:   Preferred imaging location?    Answer:   [facility]    Order Specific Question:   Radiology Contrast Protocol - do NOT remove file path    Answer:   [email or web address]  . B12    Standing Status:   Future    Standing Expiration Date:   11/10/2018  . Magnesium    Standing Status:   Future    Standing Expiration Date:   11/10/2018  . PSA    Standing Status:   Future    Standing Expiration Date:   11/10/2018    Requested Prescriptions    No prescriptions requested or ordered in this encounter

## 2017-11-12 DIAGNOSIS — M542 Cervicalgia: Secondary | ICD-10-CM | POA: Diagnosis not present

## 2017-11-12 DIAGNOSIS — M4712 Other spondylosis with myelopathy, cervical region: Secondary | ICD-10-CM | POA: Diagnosis not present

## 2017-11-12 DIAGNOSIS — I10 Essential (primary) hypertension: Secondary | ICD-10-CM | POA: Diagnosis not present

## 2017-11-25 ENCOUNTER — Ambulatory Visit
Admission: RE | Admit: 2017-11-25 | Discharge: 2017-11-25 | Disposition: A | Discharge status: Home / Self Care | Payer: Medicare Other | Source: Ambulatory Visit | Attending: Family | Admitting: Family

## 2017-11-25 ENCOUNTER — Other Ambulatory Visit: Payer: Self-pay | Admitting: Family

## 2017-11-25 DIAGNOSIS — M5416 Radiculopathy, lumbar region: Secondary | ICD-10-CM

## 2017-11-25 DIAGNOSIS — M48061 Spinal stenosis, lumbar region without neurogenic claudication: Secondary | ICD-10-CM | POA: Diagnosis not present

## 2017-11-26 ENCOUNTER — Telehealth: Payer: Self-pay | Admitting: Family

## 2017-11-26 NOTE — Telephone Encounter (Signed)
Pt given results per Jodi Mourning, "MRI shows severe arthritis changes; would recommend that he see a neurosurgeon to discuss treatment options- suspect he will benefit from steroid injections"; he verbalizes understanding and would like the neurosurgery consultation; the pt can be contacted at [phone]; will route to office for notification of this encounter; unable to chart in result note because no encounter created.

## 2017-11-26 NOTE — Telephone Encounter (Signed)
Please refer patient.  Thanks

## 2017-11-27 ENCOUNTER — Other Ambulatory Visit: Payer: Self-pay | Admitting: Family

## 2017-11-27 DIAGNOSIS — M5416 Radiculopathy, lumbar region: Secondary | ICD-10-CM

## 2017-11-30 ENCOUNTER — Telehealth: Payer: Self-pay

## 2017-11-30 ENCOUNTER — Other Ambulatory Visit: Payer: Self-pay | Admitting: Family

## 2017-11-30 MED ORDER — PREDNISONE 20 MG PO TABS: 20.0000 mg | ORAL_TABLET | Freq: Every day | ORAL | 0 refills | Status: DC

## 2017-11-30 NOTE — Telephone Encounter (Signed)
Called patient and went over with him about the severe arthritis and the referral to neurosurgeon for the injections, patient stated that he still has not gotten a call. States that pain will get so bad that he cannot walk. Wanting to know if something can be sent in to help till he gets in with the neurosurgeon.

## 2017-11-30 NOTE — Telephone Encounter (Signed)
Copied from [place] [id number]. Topic: General - Other >> Nov 30, 2017  8:45 AM Valla Leaver wrote: Reason for CRM: Patient calling to speak with Dr. Sharlet Salina or cma about his current conditions? Possibly RE recent MRI ordered by [facility]. Declined to provide additional info.

## 2017-12-07 NOTE — Telephone Encounter (Signed)
I'm happy to see him if needed for pain. He had visit with Mickel Baas almost a month ago now so may benefit from evaluation in the office.

## 2017-12-07 NOTE — Telephone Encounter (Signed)
Pt called to state that he still has not gotten an appt scheduled with neuro, and  is still experiencing severe pain in his hip/leg and is asking if something can be called in for him until he can see the neurosurgeon.

## 2017-12-07 NOTE — Telephone Encounter (Signed)
Referral has been faxed to [facility] like he is asking for some medication till he can get in with neurosurgeon

## 2017-12-07 NOTE — Telephone Encounter (Signed)
Can you make patient an appointment with Dr. Sharlet Salina. Thank you

## 2017-12-08 NOTE — Telephone Encounter (Signed)
appt made

## 2017-12-09 ENCOUNTER — Ambulatory Visit (INDEPENDENT_AMBULATORY_CARE_PROVIDER_SITE_OTHER): Payer: Medicare Other | Admitting: Internal Medicine

## 2017-12-09 ENCOUNTER — Encounter: Payer: Self-pay | Admitting: Internal Medicine

## 2017-12-09 VITALS — BP 120/70 | HR 65 | Temp 97.8°F | Ht 67.0 in | Wt 146.0 lb

## 2017-12-09 DIAGNOSIS — Z23 Encounter for immunization: Secondary | ICD-10-CM | POA: Diagnosis not present

## 2017-12-09 DIAGNOSIS — M5416 Radiculopathy, lumbar region: Secondary | ICD-10-CM | POA: Diagnosis not present

## 2017-12-09 MED ORDER — METHYLPREDNISOLONE ACETATE 40 MG/ML IJ SUSP
40.0000 mg | Freq: Once | INTRAMUSCULAR | Status: AC
  Administered 2017-12-09: 40 mg via INTRAMUSCULAR

## 2017-12-09 MED ORDER — GABAPENTIN 100 MG PO CAPS: 100.0000 mg | ORAL_CAPSULE | Freq: Two times a day (BID) | ORAL | 0 refills | Status: DC

## 2017-12-09 MED ORDER — TAMSULOSIN HCL 0.4 MG PO CAPS: 0.4000 mg | ORAL_CAPSULE | Freq: Every day | ORAL | 3 refills | Status: DC

## 2017-12-09 NOTE — Addendum Note (Signed)
Addended by: Raford Pitcher R on: 12/09/2017 09:42 AM   Modules accepted: Orders

## 2017-12-09 NOTE — Patient Instructions (Signed)
We have sent in gabapentin for pain to take daily for the first 3 days. Then you can increase to taking 1 pill twice a day.

## 2017-12-09 NOTE — Assessment & Plan Note (Signed)
Given depo-medrol 40 mg IM today, then gabapentin 100 mg BID to see if this helps.

## 2017-12-09 NOTE — Progress Notes (Signed)
   Subjective:    Patient ID: Evan Escobar, male    DOB: [birth year], 77 y.o.   MRN: [id number]  HPI The patient is a 77 YO man coming in for left side pain and left hip pain. 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 Denies bowel or bladder changes. Has had some fall due to weakness due to pain. More pain with standing or walking. Has tried tylenol for pain and this is not helping. Pain is 6/10. Overall stable since worsening about 1 month ago. Denies overuse or injury.   Review of Systems  Constitutional: Positive for activity change. Negative for appetite change, fatigue, fever and unexpected weight change.  Respiratory: Negative.   Cardiovascular: Negative.   Musculoskeletal: Positive for back pain, gait problem and myalgias. Negative for arthralgias.  Skin: Negative.   Neurological: Negative for syncope, weakness and numbness.      Objective:   Physical Exam  Constitutional: He is oriented to person, place, and time. He appears well-developed and well-nourished.  HENT:  Head: Normocephalic and atraumatic.  Eyes: EOM are normal.  Neck: Normal range of motion.  Cardiovascular: Normal rate and regular rhythm.  Pulmonary/Chest: Effort normal and breath sounds normal. No respiratory distress. He has no wheezes. He has no rales.  Abdominal: Soft. He exhibits no distension. There is no tenderness. There is no rebound.  Musculoskeletal: He exhibits tenderness. He exhibits no edema.  Neurological: He is alert and oriented to person, place, and time. Coordination abnormal.  walker  Skin: Skin is warm and dry.   Vitals:   12/09/17 0902  BP: 120/70  Pulse: 65  Temp: 97.8 F (36.6 C)  TempSrc: Oral  SpO2: 99%  Weight: 146 lb (66.2 kg)  Height: 5\' 7"  (1.702 m)      Assessment & Plan:  Depo-medrol 40 mg IM and flu shot given at visit

## 2017-12-15 DIAGNOSIS — M4316 Spondylolisthesis, lumbar region: Secondary | ICD-10-CM | POA: Diagnosis not present

## 2017-12-15 DIAGNOSIS — M5126 Other intervertebral disc displacement, lumbar region: Secondary | ICD-10-CM | POA: Diagnosis not present

## 2017-12-22 ENCOUNTER — Other Ambulatory Visit: Payer: Self-pay | Admitting: Neurosurgery

## 2017-12-22 DIAGNOSIS — M4316 Spondylolisthesis, lumbar region: Secondary | ICD-10-CM

## 2017-12-29 ENCOUNTER — Ambulatory Visit
Admission: RE | Admit: 2017-12-29 | Discharge: 2017-12-29 | Disposition: A | Discharge status: Home / Self Care | Payer: Medicare Other | Source: Ambulatory Visit | Attending: Neurosurgery | Admitting: Neurosurgery

## 2017-12-29 DIAGNOSIS — M48061 Spinal stenosis, lumbar region without neurogenic claudication: Secondary | ICD-10-CM | POA: Diagnosis not present

## 2017-12-29 DIAGNOSIS — M4316 Spondylolisthesis, lumbar region: Secondary | ICD-10-CM

## 2017-12-29 MED ORDER — IOPAMIDOL (ISOVUE-M 200) INJECTION 41%
1.0000 mL | Freq: Once | INTRAMUSCULAR | Status: AC
  Administered 2017-12-29: 1 mL via EPIDURAL

## 2017-12-29 MED ORDER — METHYLPREDNISOLONE ACETATE 40 MG/ML INJ SUSP (RADIOLOG
120.0000 mg | Freq: Once | INTRAMUSCULAR | Status: AC
  Administered 2017-12-29: 120 mg via EPIDURAL

## 2017-12-29 NOTE — Discharge Instructions (Signed)

## 2018-02-12 ENCOUNTER — Encounter: Payer: Self-pay | Admitting: Nurse Practitioner

## 2018-02-20 ENCOUNTER — Emergency Department (HOSPITAL_COMMUNITY)
Admission: EM | Admit: 2018-02-20 | Discharge: 2018-02-20 | Disposition: A | Discharge status: Home / Self Care | Payer: Medicare Other | Attending: Emergency Medicine | Admitting: Emergency Medicine

## 2018-02-20 ENCOUNTER — Emergency Department (HOSPITAL_COMMUNITY): Payer: Medicare Other

## 2018-02-20 ENCOUNTER — Encounter (HOSPITAL_COMMUNITY): Payer: Self-pay

## 2018-02-20 DIAGNOSIS — Y939 Activity, unspecified: Secondary | ICD-10-CM | POA: Insufficient documentation

## 2018-02-20 DIAGNOSIS — Y92410 Unspecified street and highway as the place of occurrence of the external cause: Secondary | ICD-10-CM | POA: Insufficient documentation

## 2018-02-20 DIAGNOSIS — Z79899 Other long term (current) drug therapy: Secondary | ICD-10-CM | POA: Insufficient documentation

## 2018-02-20 DIAGNOSIS — Y998 Other external cause status: Secondary | ICD-10-CM | POA: Diagnosis not present

## 2018-02-20 DIAGNOSIS — M549 Dorsalgia, unspecified: Secondary | ICD-10-CM | POA: Diagnosis not present

## 2018-02-20 DIAGNOSIS — M545 Low back pain: Secondary | ICD-10-CM | POA: Diagnosis not present

## 2018-02-20 DIAGNOSIS — R52 Pain, unspecified: Secondary | ICD-10-CM | POA: Diagnosis not present

## 2018-02-20 DIAGNOSIS — Z7982 Long term (current) use of aspirin: Secondary | ICD-10-CM | POA: Insufficient documentation

## 2018-02-20 DIAGNOSIS — S3992XA Unspecified injury of lower back, initial encounter: Secondary | ICD-10-CM | POA: Diagnosis not present

## 2018-02-20 DIAGNOSIS — I10 Essential (primary) hypertension: Secondary | ICD-10-CM | POA: Insufficient documentation

## 2018-02-20 DIAGNOSIS — R51 Headache: Secondary | ICD-10-CM | POA: Diagnosis not present

## 2018-02-20 DIAGNOSIS — R0602 Shortness of breath: Secondary | ICD-10-CM | POA: Diagnosis not present

## 2018-02-20 DIAGNOSIS — R079 Chest pain, unspecified: Secondary | ICD-10-CM | POA: Diagnosis not present

## 2018-02-20 DIAGNOSIS — S299XXA Unspecified injury of thorax, initial encounter: Secondary | ICD-10-CM | POA: Diagnosis not present

## 2018-02-20 DIAGNOSIS — M542 Cervicalgia: Secondary | ICD-10-CM | POA: Insufficient documentation

## 2018-02-20 MED ORDER — ACETAMINOPHEN 325 MG PO TABS
650.0000 mg | ORAL_TABLET | Freq: Once | ORAL | Status: AC
  Administered 2018-02-20: 650 mg via ORAL
  Filled 2018-02-20: qty 2

## 2018-02-20 NOTE — Discharge Instructions (Signed)
The pain you are experiencing is likely due to muscle strain, you may take tylenol as needed for pain management.   You may also use ice and heat, and over-the-counter remedies such as Biofreeze gel or salon pas lidocaine patches. The muscle soreness should improve over the next week. Follow up with your family doctor in the next week for a recheck if you are still having symptoms. Return to ED if pain is worsening, you develop weakness or numbness of extremities, or new or concerning symptoms develop.  
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
## 2018-03-02 ENCOUNTER — Encounter: Payer: Self-pay | Admitting: Internal Medicine

## 2018-03-02 ENCOUNTER — Ambulatory Visit (INDEPENDENT_AMBULATORY_CARE_PROVIDER_SITE_OTHER): Payer: Medicare Other | Admitting: Internal Medicine

## 2018-03-02 VITALS — BP 138/74 | HR 68 | Temp 98.2°F | Ht 67.0 in | Wt 144.0 lb

## 2018-03-02 DIAGNOSIS — M25512 Pain in left shoulder: Secondary | ICD-10-CM

## 2018-03-02 DIAGNOSIS — G8929 Other chronic pain: Secondary | ICD-10-CM | POA: Insufficient documentation

## 2018-03-02 DIAGNOSIS — M79645 Pain in left finger(s): Secondary | ICD-10-CM

## 2018-03-02 DIAGNOSIS — M542 Cervicalgia: Secondary | ICD-10-CM

## 2018-03-02 DIAGNOSIS — M25511 Pain in right shoulder: Secondary | ICD-10-CM | POA: Diagnosis not present

## 2018-03-02 DIAGNOSIS — M25519 Pain in unspecified shoulder: Secondary | ICD-10-CM | POA: Insufficient documentation
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
## 2018-03-02 NOTE — Assessment & Plan Note (Signed)
Referral to hand surgery for evaluation.

## 2018-03-02 NOTE — Assessment & Plan Note (Signed)
Advised to continue tylenol. No evidence of dislocation or tendon tear on exam. Can use heat as well. Advised likely course for recovery 1-2 months.

## 2018-03-02 NOTE — Patient Instructions (Signed)
We will have you use tylenol for the pain or heating pad.   We will get you in with a hand specialist to look at the thumb to see if they can help it.

## 2018-03-09 DIAGNOSIS — H401133 Primary open-angle glaucoma, bilateral, severe stage: Secondary | ICD-10-CM | POA: Diagnosis not present

## 2018-03-11 ENCOUNTER — Encounter: Payer: Self-pay | Admitting: Podiatry

## 2018-03-11 ENCOUNTER — Ambulatory Visit: Payer: Medicare Other | Admitting: Podiatry

## 2018-03-11 DIAGNOSIS — L600 Ingrowing nail: Secondary | ICD-10-CM

## 2018-03-11 DIAGNOSIS — M21619 Bunion of unspecified foot: Secondary | ICD-10-CM | POA: Diagnosis not present

## 2018-03-11 DIAGNOSIS — M2040 Other hammer toe(s) (acquired), unspecified foot: Secondary | ICD-10-CM

## 2018-03-11 MED ORDER — NEOMYCIN-POLYMYXIN-HC 3.5-10000-1 OT SOLN: OTIC | 0 refills | Status: DC

## 2018-03-11 NOTE — Progress Notes (Signed)
Subjective:   Patient ID: Evan Escobar, male   DOB: 78 y.o.   MRN: [id number]   HPI Patient presents with ingrown toenail deformity left second target is painful and also states that he has a callus between the big toe and second toe left with structural changes and bunion deformity   ROS      Objective:  Physical Exam  Neurovascular status unchanged from previous visit with range of motion mildly diminished and muscle strength mildly diminished bilateral.  Patient is found to have structural bunion deformity bilateral pressure between the hallux and second toes with an incurvated second digit left medial border that is painful when pressed with no redness or drainage noted.  Patient does have good digital perfusion is well oriented x3     Assessment:  Structural changes with bunion hammertoe deformity along with ingrown toenail deformity second digit right which may be contributory to problem with structural bunion also contributory to the pain     Plan:  H&P all conditions discussed and will get a correct ingrown toenail and hope that this will solve the problem and he will not require bony surgery.  I went ahead today and allowed him to read and then signed consent form understanding risk and I then infiltrated the left second digit 60 mg like Marcaine mixture using sterile instrumentation I remove the medial border exposed the matrix on the medial side and applied chemical 2 applications 30 seconds phenol followed by alcohol lavage sterile dressing.  Gave instructions on soaks and gave instructions on leaving dressing on 24 hours but taking it off earlier if it should start to throb.  Discussed to structural deformity and dispensed pad to separate the big toe second toe left and that ultimately surgical intervention may be necessary  X-ray indicates that there is structural malalignment of the first metatarsal against the second digit bilateral with elevation of the digits with  structural bunion deformity noted

## 2018-03-11 NOTE — Patient Instructions (Signed)

## 2018-03-12 ENCOUNTER — Telehealth: Payer: Self-pay | Admitting: Podiatry

## 2018-03-12 NOTE — Telephone Encounter (Signed)
I called pt and asked if he had begun the soaks and he states he had not gotten the antibiotic yet. I told pt to begin the soaks now, removing the dressing and soaking would help the toe get the circulation back to normal and he could use an antibiotic ointment bandaid this time until the drops were received and then perform the soaks and use only the drops as directed. I asked pt if he could use OTC antiinflammatory medications and he states he has been using tylenol. I told pt that if he could take the OTC ibuprofen should take as the package directs and that would help with the inflammatory discomfort he was having. Pt states understanding.

## 2018-03-12 NOTE — Telephone Encounter (Signed)
Pt had ingrown toenail removed yesterday and is experiencing severe pain and would like to know if there was supposed to be a pain medication called in and if not what can he do to help with pain? Please give pt a call.

## 2018-03-17 ENCOUNTER — Other Ambulatory Visit: Payer: Self-pay | Admitting: Neurosurgery

## 2018-03-17 DIAGNOSIS — H9041 Sensorineural hearing loss, unilateral, right ear, with unrestricted hearing on the contralateral side: Secondary | ICD-10-CM | POA: Diagnosis not present

## 2018-03-17 DIAGNOSIS — H6121 Impacted cerumen, right ear: Secondary | ICD-10-CM | POA: Diagnosis not present

## 2018-03-17 DIAGNOSIS — H903 Sensorineural hearing loss, bilateral: Secondary | ICD-10-CM | POA: Diagnosis not present

## 2018-03-17 DIAGNOSIS — M5440 Lumbago with sciatica, unspecified side: Secondary | ICD-10-CM

## 2018-03-23 ENCOUNTER — Ambulatory Visit
Admission: RE | Admit: 2018-03-23 | Discharge: 2018-03-23 | Disposition: A | Discharge status: Home / Self Care | Payer: Medicare Other | Source: Ambulatory Visit | Attending: Neurosurgery | Admitting: Neurosurgery

## 2018-03-23 DIAGNOSIS — M5440 Lumbago with sciatica, unspecified side: Secondary | ICD-10-CM

## 2018-03-23 DIAGNOSIS — M47817 Spondylosis without myelopathy or radiculopathy, lumbosacral region: Secondary | ICD-10-CM | POA: Diagnosis not present

## 2018-03-23 MED ORDER — IOPAMIDOL (ISOVUE-M 200) INJECTION 41%
1.0000 mL | Freq: Once | INTRAMUSCULAR | Status: AC
  Administered 2018-03-23: 1 mL via EPIDURAL

## 2018-03-23 MED ORDER — METHYLPREDNISOLONE ACETATE 40 MG/ML INJ SUSP (RADIOLOG
120.0000 mg | Freq: Once | INTRAMUSCULAR | Status: AC
  Administered 2018-03-23: 120 mg via EPIDURAL

## 2018-03-23 NOTE — Discharge Instructions (Signed)

## 2018-03-29 ENCOUNTER — Other Ambulatory Visit: Payer: Self-pay | Admitting: Internal Medicine

## 2018-03-29 DIAGNOSIS — I10 Essential (primary) hypertension: Secondary | ICD-10-CM

## 2018-05-12 ENCOUNTER — Telehealth: Payer: Self-pay

## 2018-05-12 ENCOUNTER — Ambulatory Visit: Payer: Medicare Other | Admitting: Internal Medicine

## 2018-05-12 MED ORDER — BENZONATATE 200 MG PO CAPS: 200.0000 mg | ORAL_CAPSULE | Freq: Three times a day (TID) | ORAL | 0 refills | Status: DC | PRN

## 2018-05-12 NOTE — Telephone Encounter (Signed)
Pt called stating benzonatate (TESSALON) 200 MG capsule is not covered by insurance. He cannot afford $70. States no alternative meds were told to him. Please advise on a different cough medication.   [place], [place]

## 2018-05-12 NOTE — Telephone Encounter (Signed)
Called patient and states that he has a productive cough with white phlegm, mostly having sinus pressure and nasal congestion with white mucus. X1 week No SOB or trouble breathing and states does not feel like he is having any fevers. Taking tylenol.

## 2018-05-12 NOTE — Telephone Encounter (Signed)
Sent in Sequ[place]ah for cough to use up to 3 times per day. Also advise to start claritin or zyrtec over the counter to help with sinus drainage. Call back or seek care for problems breathing.

## 2018-05-12 NOTE — Addendum Note (Signed)
Addended by: [name] on: 05/12/2018 09:35 AM   Modules accepted: Orders

## 2018-05-12 NOTE — Telephone Encounter (Signed)
LVM with MD response  

## 2018-05-13 NOTE — Telephone Encounter (Signed)
Can take otc cough medicines and just start zyrtec (cetirizine) which will help with coughing in time (1 week or so).

## 2018-05-13 NOTE — Telephone Encounter (Signed)
Pt informed of below.  

## 2018-05-26 ENCOUNTER — Ambulatory Visit: Payer: Medicare Other | Admitting: Podiatry

## 2018-05-26 ENCOUNTER — Other Ambulatory Visit: Payer: Self-pay

## 2018-05-26 ENCOUNTER — Encounter: Payer: Self-pay | Admitting: Podiatry

## 2018-05-26 VITALS — Temp 97.5°F

## 2018-05-26 DIAGNOSIS — L84 Corns and callosities: Secondary | ICD-10-CM | POA: Diagnosis not present

## 2018-05-26 DIAGNOSIS — M2040 Other hammer toe(s) (acquired), unspecified foot: Secondary | ICD-10-CM

## 2018-05-26 NOTE — Patient Instructions (Signed)

## 2018-05-27 NOTE — Progress Notes (Signed)
Subjective:   Patient ID: Evan Escobar, male   DOB: 78 y.o.   MRN: [id number]   HPI Patient presents with digital deformity second digit left that is quite painful with structural deformity and hammertoe second digit along with structural bunion deformity   ROS      Objective:  Physical Exam  Neurovascular status unchanged with patient found to have normal nail after having had nail surgery but has keratotic lesion on the inside of the second digit left where there is pressure between the second digit and hallux creating compression of this area.  There is good digital perfusion and patient is well oriented x3     Assessment:  Stronger possibility that this is a structural deformity with deviation of the second digit against the hallux with keratotic lesion second digit left     Plan:  H&P reviewed condition and recommended debridement of lesion today with possibility long-term for digital arthroplasty with possible structural bunion correction.  I educated him on this and what would be required but at this point we are going to try conservative and aggressive debridement accomplished today

## 2018-07-15 ENCOUNTER — Other Ambulatory Visit: Payer: Self-pay | Admitting: Neurosurgery

## 2018-07-15 DIAGNOSIS — M5441 Lumbago with sciatica, right side: Secondary | ICD-10-CM

## 2018-07-15 DIAGNOSIS — G8929 Other chronic pain: Secondary | ICD-10-CM

## 2018-07-16 ENCOUNTER — Ambulatory Visit: Payer: Self-pay | Admitting: Internal Medicine

## 2018-07-16 NOTE — Telephone Encounter (Signed)
Pt. Reports he has had weakness in his left arm x 1 year.Is also having constipation. Has urinary urgency at times as well. Has an appointment Monday with Dr. Sharlet Salina. If symptoms worsen, pt. Will go to ED. Answer Assessment - Initial Assessment Questions 1. SYMPTOM: "What is the main symptom you are concerned about?" (e.g., weakness, numbness)     Weakness left side 2. ONSET: "When did this start?" (minutes, hours, days; while sleeping)     Started "awile back" 3. LAST NORMAL: "When was the last time you were normal (no symptoms)?"     Unsure 4. PATTERN "Does this come and go, or has it been constant since it started?"  "Is it present now?"     Comes and goes 5. CARDIAC SYMPTOMS: "Have you had any of the following symptoms: chest pain, difficulty breathing, palpitations?"     No 6. NEUROLOGIC SYMPTOMS: "Have you had any of the following symptoms: headache, dizziness, vision loss, double vision, changes in speech, unsteady on your feet?"     No 7. OTHER SYMPTOMS: "Do you have any other symptoms?"     Constipation and urinary urgency 8. PREGNANCY: "Is there any chance you are pregnant?" "When was your last menstrual period?"     N/a  Protocols used: NEUROLOGIC DEFICIT-A-AH

## 2018-07-16 NOTE — Telephone Encounter (Signed)
Noted thanks °

## 2018-07-19 ENCOUNTER — Telehealth: Payer: Self-pay | Admitting: *Deleted

## 2018-07-19 ENCOUNTER — Encounter: Payer: Self-pay | Admitting: Internal Medicine

## 2018-07-19 ENCOUNTER — Other Ambulatory Visit: Payer: Self-pay

## 2018-07-19 ENCOUNTER — Other Ambulatory Visit (INDEPENDENT_AMBULATORY_CARE_PROVIDER_SITE_OTHER): Payer: Medicare Other

## 2018-07-19 ENCOUNTER — Ambulatory Visit (INDEPENDENT_AMBULATORY_CARE_PROVIDER_SITE_OTHER): Payer: Medicare Other | Admitting: Internal Medicine

## 2018-07-19 VITALS — BP 122/70 | HR 66 | Temp 98.2°F | Ht 67.0 in | Wt 147.0 lb

## 2018-07-19 DIAGNOSIS — M5442 Lumbago with sciatica, left side: Secondary | ICD-10-CM | POA: Diagnosis not present

## 2018-07-19 DIAGNOSIS — M542 Cervicalgia: Secondary | ICD-10-CM

## 2018-07-19 DIAGNOSIS — K5900 Constipation, unspecified: Secondary | ICD-10-CM

## 2018-07-19 DIAGNOSIS — G8929 Other chronic pain: Secondary | ICD-10-CM

## 2018-07-19 LAB — CBC
HCT: 34.9 % — ABNORMAL LOW (ref 39.0–52.0)
Hemoglobin: 11.7 g/dL — ABNORMAL LOW (ref 13.0–17.0)
MCHC: 33.6 g/dL (ref 30.0–36.0)
MCV: 84.8 fl (ref 78.0–100.0)
Platelets: 146 10*3/uL — ABNORMAL LOW (ref 150.0–400.0)
RBC: 4.11 Mil/uL — ABNORMAL LOW (ref 4.22–5.81)
RDW: 14.2 % (ref 11.5–15.5)
WBC: 4 10*3/uL (ref 4.0–10.5)

## 2018-07-19 NOTE — Telephone Encounter (Signed)
Called pt. Due to current COVID 19 pandemic, our office is severely reducing in office visits until further notice, in order to minimize the risk to our patients and healthcare providers. He states does not know how to do VV with smart phone. Consented to TELEPHONE VISIT on 07-20-18 at 1545.   Pt understands that although there may be some limitations with this type of visit, we will take all precautions to reduce any security or privacy concerns.  Pt understands that this will be treated like an in office visit and we will file with pt's insurance, and there may be a patient responsible charge related to this service.

## 2018-07-19 NOTE — Progress Notes (Signed)
   Subjective:   Patient ID: Evan Escobar, male    DOB: 1940/11/26, 78 y.o.   MRN: [id number]  HPI The patient is a 78 YO man coming in for left side pain consisting of left leg pain (hurts sometimes from low back to leg, denies numbness or weakness, denies change in bowel or bladder function, overall this has been going on for weeks, takes tylenol for other pain and it seems to help, the more walking he does this tends to hurt more, denies worsening) and left arm pain (since surgery on his neck last April, some intermittent tingling, some pain in his arm, takes tylenol 2 pills in the morning and 1 pill in the evening to manage the pain, mostly in shoulder and down arm from the neck, has not returned to his neck specialist about this) as well as left lower stomach pain (intermittent, improved when he is able to pass gas, chronic constipation and goes maybe 1-2 times per week, unable to quantify, takes milk of magnesia when he hasn't gone in a few days, denies nausea or vomiting, denies blood in stool, denies problems with not being able to pass gas).   Review of Systems  Constitutional: Negative.   HENT: Negative.   Eyes: Negative.   Respiratory: Negative for cough, chest tightness and shortness of breath.   Cardiovascular: Negative for chest pain, palpitations and leg swelling.  Gastrointestinal: Positive for abdominal distention, abdominal pain and constipation. Negative for anal bleeding, blood in stool, diarrhea, nausea, rectal pain and vomiting.  Musculoskeletal: Positive for arthralgias, back pain, myalgias and neck pain.  Skin: Negative.   Neurological: Positive for numbness.       Tingling in his arm, not true numbness  Psychiatric/Behavioral: Negative.     Objective:  Physical Exam Constitutional:      Appearance: He is well-developed.  HENT:     Head: Normocephalic and atraumatic.  Neck:     Musculoskeletal: Normal range of motion.  Cardiovascular:     Rate and Rhythm: Normal  rate and regular rhythm.  Pulmonary:     Effort: Pulmonary effort is normal. No respiratory distress.     Breath sounds: Normal breath sounds. No wheezing or rales.  Abdominal:     General: Bowel sounds are normal. There is no distension.     Palpations: Abdomen is soft.     Tenderness: There is no abdominal tenderness. There is no rebound.     Comments: Minimal distention versus adiposity, good BS, minimal left flank tenderness, no rebound or guarding  Musculoskeletal:        General: Tenderness present.     Comments: Some tenderness in the low back region, some tenderness to touch in the left lateral neck and shoulder  Skin:    General: Skin is warm and dry.  Neurological:     Mental Status: He is alert and oriented to person, place, and time.     Coordination: Coordination normal.     Vitals:   07/19/18 1559  BP: 122/70  Pulse: 66  Temp: 98.2 F (36.8 C)  TempSrc: Oral  SpO2: 98%  Weight: 147 lb (66.7 kg)  Height: 5\' 7"  (1.702 m)    Assessment & Plan:

## 2018-07-19 NOTE — Patient Instructions (Signed)
We are checking the labs today.  

## 2018-07-20 ENCOUNTER — Ambulatory Visit (INDEPENDENT_AMBULATORY_CARE_PROVIDER_SITE_OTHER): Payer: Medicare Other | Admitting: Neurology

## 2018-07-20 ENCOUNTER — Encounter: Payer: Self-pay | Admitting: Neurology

## 2018-07-20 ENCOUNTER — Telehealth: Payer: Self-pay

## 2018-07-20 ENCOUNTER — Other Ambulatory Visit: Payer: Self-pay

## 2018-07-20 DIAGNOSIS — R569 Unspecified convulsions: Secondary | ICD-10-CM

## 2018-07-20 DIAGNOSIS — M545 Low back pain, unspecified: Secondary | ICD-10-CM | POA: Insufficient documentation

## 2018-07-20 LAB — COMPREHENSIVE METABOLIC PANEL
ALT: 9 U/L (ref 0–53)
AST: 17 U/L (ref 0–37)
Albumin: 4.1 g/dL (ref 3.5–5.2)
Alkaline Phosphatase: 28 U/L — ABNORMAL LOW (ref 39–117)
BUN: 16 mg/dL (ref 6–23)
CO2: 23 mEq/L (ref 19–32)
Calcium: 9.3 mg/dL (ref 8.4–10.5)
Chloride: 104 mEq/L (ref 96–112)
Creatinine, Ser: 1.13 mg/dL (ref 0.40–1.50)
GFR: 76.14 mL/min (ref >60.00)
Glucose, Bld: 86 mg/dL (ref 70–99)
Potassium: 4 mEq/L (ref 3.5–5.1)
Sodium: 137 mEq/L (ref 135–145)
Total Bilirubin: 0.4 mg/dL (ref 0.2–1.2)
Total Protein: 7.1 g/dL (ref 6.0–8.3)

## 2018-07-20 MED ORDER — LEVETIRACETAM 500 MG PO TABS: 500.0000 mg | ORAL_TABLET | Freq: Two times a day (BID) | ORAL | 3 refills | Status: DC
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
## 2018-07-20 NOTE — Assessment & Plan Note (Signed)
Taking tylenol for pain which seems acceptable at this time. Likely this is worse due to the chronic constipation and we talked about how distended bowels can put more pressure on the bowels in general and how helping with his constipation may help with his pain.

## 2018-07-20 NOTE — Assessment & Plan Note (Signed)
Some residual pain after the surgery. He is taking tylenol for pain with good control. Advised on maximum dose of tylenol and he is under that amount currently. Advised he can return to the neck surgeon if desired for injection.

## 2018-07-20 NOTE — Telephone Encounter (Signed)
Labs have not been released and resulted yet will call when they are

## 2018-07-20 NOTE — Assessment & Plan Note (Signed)
Suspect that this is the main cause of his left flank pain and possibly contributing to the left low back pain and left leg pain as well. Advised to start taking laxative daily to help empty bowels. Checking CBC, CMP. If any abnormalities may need CT abdomen.

## 2018-07-20 NOTE — Telephone Encounter (Signed)
Copied from [place] [id number]. Topic: General - Other >> Jul 20, 2018  2:16 PM Oneta Rack wrote: Patient inquiring about lab results, please advise

## 2018-07-21 ENCOUNTER — Other Ambulatory Visit: Payer: Self-pay

## 2018-07-21 ENCOUNTER — Ambulatory Visit
Admission: RE | Admit: 2018-07-21 | Discharge: 2018-07-21 | Disposition: A | Discharge status: Home / Self Care | Payer: Medicare Other | Source: Ambulatory Visit | Attending: Neurosurgery | Admitting: Neurosurgery

## 2018-07-21 ENCOUNTER — Ambulatory Visit: Payer: Medicare Other | Admitting: Nurse Practitioner

## 2018-07-21 ENCOUNTER — Ambulatory Visit: Payer: Medicare Other | Admitting: Neurology

## 2018-07-21 DIAGNOSIS — M545 Low back pain: Secondary | ICD-10-CM | POA: Diagnosis not present

## 2018-07-21 DIAGNOSIS — G8929 Other chronic pain: Secondary | ICD-10-CM

## 2018-07-21 MED ORDER — IOPAMIDOL (ISOVUE-M 200) INJECTION 41%
1.0000 mL | Freq: Once | INTRAMUSCULAR | Status: AC
  Administered 2018-07-21: 1 mL via EPIDURAL

## 2018-07-21 MED ORDER — METHYLPREDNISOLONE ACETATE 40 MG/ML INJ SUSP (RADIOLOG
120.0000 mg | Freq: Once | INTRAMUSCULAR | Status: AC
  Administered 2018-07-21: 120 mg via EPIDURAL

## 2018-07-21 NOTE — Discharge Instructions (Signed)

## 2018-07-21 NOTE — Progress Notes (Signed)
I have reviewed and agreed above plan. 
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
## 2018-09-09 DIAGNOSIS — H401133 Primary open-angle glaucoma, bilateral, severe stage: Secondary | ICD-10-CM | POA: Diagnosis not present

## 2018-09-27 ENCOUNTER — Other Ambulatory Visit: Payer: Self-pay | Admitting: Internal Medicine

## 2018-09-27 DIAGNOSIS — I10 Essential (primary) hypertension: Secondary | ICD-10-CM

## 2018-11-23 DIAGNOSIS — H401133 Primary open-angle glaucoma, bilateral, severe stage: Secondary | ICD-10-CM | POA: Diagnosis not present

## 2018-12-02 ENCOUNTER — Other Ambulatory Visit: Payer: Self-pay

## 2018-12-02 MED ORDER — TAMSULOSIN HCL 0.4 MG PO CAPS: 0.4000 mg | ORAL_CAPSULE | Freq: Every day | ORAL | 3 refills | Status: DC

## 2018-12-24 ENCOUNTER — Other Ambulatory Visit: Payer: Self-pay | Admitting: Internal Medicine

## 2018-12-24 DIAGNOSIS — I10 Essential (primary) hypertension: Secondary | ICD-10-CM

## 2019-01-06 ENCOUNTER — Ambulatory Visit: Payer: Medicare Other | Admitting: Internal Medicine

## 2019-01-06 DIAGNOSIS — H401133 Primary open-angle glaucoma, bilateral, severe stage: Secondary | ICD-10-CM | POA: Diagnosis not present

## 2019-01-10 ENCOUNTER — Encounter: Payer: Self-pay | Admitting: Internal Medicine

## 2019-01-10 ENCOUNTER — Other Ambulatory Visit (INDEPENDENT_AMBULATORY_CARE_PROVIDER_SITE_OTHER): Payer: Medicare Other

## 2019-01-10 ENCOUNTER — Other Ambulatory Visit: Payer: Self-pay

## 2019-01-10 ENCOUNTER — Ambulatory Visit (INDEPENDENT_AMBULATORY_CARE_PROVIDER_SITE_OTHER): Payer: Medicare Other | Admitting: Internal Medicine

## 2019-01-10 VITALS — BP 130/80 | HR 61 | Temp 98.0°F | Ht 67.0 in | Wt 150.0 lb

## 2019-01-10 DIAGNOSIS — M5412 Radiculopathy, cervical region: Secondary | ICD-10-CM | POA: Diagnosis not present

## 2019-01-10 DIAGNOSIS — I10 Essential (primary) hypertension: Secondary | ICD-10-CM | POA: Diagnosis not present

## 2019-01-10 DIAGNOSIS — K5900 Constipation, unspecified: Secondary | ICD-10-CM | POA: Diagnosis not present

## 2019-01-10 DIAGNOSIS — H579 Unspecified disorder of eye and adnexa: Secondary | ICD-10-CM | POA: Diagnosis not present

## 2019-01-10 DIAGNOSIS — R29818 Other symptoms and signs involving the nervous system: Secondary | ICD-10-CM

## 2019-01-10 LAB — LIPID PANEL
Cholesterol: 198 mg/dL (ref 0–200)
HDL: 72.7 mg/dL (ref >39.00)
LDL Cholesterol: 105 mg/dL — ABNORMAL HIGH (ref 0–99)
NonHDL: 124.9
Total CHOL/HDL Ratio: 3
Triglycerides: 101 mg/dL (ref 0.0–149.0)
VLDL: 20.2 mg/dL (ref 0.0–40.0)

## 2019-01-10 LAB — CBC
HCT: 36.6 % — ABNORMAL LOW (ref 39.0–52.0)
Hemoglobin: 12.1 g/dL — ABNORMAL LOW (ref 13.0–17.0)
MCHC: 32.9 g/dL (ref 30.0–36.0)
MCV: 86.4 fl (ref 78.0–100.0)
Platelets: 158 10*3/uL (ref 150.0–400.0)
RBC: 4.24 Mil/uL (ref 4.22–5.81)
RDW: 13.5 % (ref 11.5–15.5)
WBC: 4.2 10*3/uL (ref 4.0–10.5)

## 2019-01-10 LAB — COMPREHENSIVE METABOLIC PANEL
ALT: 13 U/L (ref 0–53)
AST: 20 U/L (ref 0–37)
Albumin: 4 g/dL (ref 3.5–5.2)
Alkaline Phosphatase: 29 U/L — ABNORMAL LOW (ref 39–117)
BUN: 16 mg/dL (ref 6–23)
CO2: 26 mEq/L (ref 19–32)
Calcium: 9.4 mg/dL (ref 8.4–10.5)
Chloride: 103 mEq/L (ref 96–112)
Creatinine, Ser: 1.11 mg/dL (ref 0.40–1.50)
GFR: 77.63 mL/min (ref >60.00)
Glucose, Bld: 92 mg/dL (ref 70–99)
Potassium: 4.2 mEq/L (ref 3.5–5.1)
Sodium: 138 mEq/L (ref 135–145)
Total Bilirubin: 0.5 mg/dL (ref 0.2–1.2)
Total Protein: 7.1 g/dL (ref 6.0–8.3)

## 2019-01-10 LAB — VITAMIN B12: Vitamin B-12: 1057 pg/mL — ABNORMAL HIGH (ref 211–911)

## 2019-01-10 LAB — TSH: TSH: 0.86 u[IU]/mL (ref 0.35–4.50)

## 2019-01-10 LAB — VITAMIN D 25 HYDROXY (VIT D DEFICIENCY, FRACTURES): VITD: 47.89 ng/mL (ref 30.00–100.00)

## 2019-01-10 LAB — HEMOGLOBIN A1C: Hgb A1c MFr Bld: 5.8 % (ref 4.6–6.5)

## 2019-01-10 LAB — T4, FREE: Free T4: 0.95 ng/dL (ref 0.60–1.60)

## 2019-01-10 MED ORDER — GABAPENTIN 100 MG PO CAPS: 100.0000 mg | ORAL_CAPSULE | Freq: Two times a day (BID) | ORAL | 0 refills | Status: DC

## 2019-01-10 NOTE — Assessment & Plan Note (Signed)
Advised to add water to diet, try senokot-d for constipation and if not effective let us know as it would be appropriate to try prescription agent.

## 2019-01-10 NOTE — Assessment & Plan Note (Signed)
Unclear exactly what this is. We do not have those records. Ordering CT head to assess for stroke. Depending on results may need further workup with carotid doppler, echo. Checking labs today.

## 2019-01-10 NOTE — Assessment & Plan Note (Signed)
Referral back to neurosurgery to assess numbness and pain. Rx gabapentin 100 mg BID to try for pain. Keep tylenol as this is working well.

## 2019-01-10 NOTE — Patient Instructions (Addendum)
We will get you in with a nerve specialist to check the shoulder and neck. We will check a CT of the head for the stroke in the eye.   You can try senokot-d over the counter for the constipation.   We have sent in gabapentin to try for the pain in the arm/shoulder up to twice a day and it is okay to take the tylenol as well.

## 2019-01-10 NOTE — Assessment & Plan Note (Signed)
Checking labs as due for monitoring lisinopril. BP at goal.

## 2019-01-10 NOTE — Progress Notes (Signed)
   Subjective:   Patient ID: Evan Escobar, male    DOB: 1940/09/21, 78 y.o.   MRN: [id number]  HPI The patient is a 78 YO man coming in for several concerns including neck pain and arm numbness/tingling (has had this before and this stopped for awhile after neck surgery 05/2017, this has been back for months at least and overall is stable from when it started, at onset he had a fall which caused some trauma to the neck and left shoulder, pain is 4/10, tingling in the left thumb and up the arm into the shoulder, neck hurts some since surgery and this is not significantly worse, has tried tylenol which helps with the pain), and constipation (uses prune and milk of magnesia, this does give him significant gas which hurts and does not work well, has tried Office manager which did not work well for him, denies abdominal pain, going 1-3 times per week, denies dark stool or blood in stool) and concerns about stroke in eye (states his eye doctor told him about 6 months ago that there was evidence of a stroke in his eye, not sure which one, seeing same eye doctor for some time and this was new then, denies neurological changes other than the left arm tingling, this did not correspond to the timing of the eye visit).   Review of Systems  Constitutional: Negative.   HENT: Negative.   Eyes: Negative.   Respiratory: Negative for cough, chest tightness and shortness of breath.   Cardiovascular: Negative for chest pain, palpitations and leg swelling.  Gastrointestinal: Positive for abdominal distention and constipation. Negative for abdominal pain, diarrhea, nausea and vomiting.  Musculoskeletal: Positive for arthralgias and myalgias.  Skin: Negative.   Neurological: Positive for numbness.  Psychiatric/Behavioral: Negative.     Objective:  Physical Exam Constitutional:      Appearance: He is well-developed.  HENT:     Head: Normocephalic and atraumatic.  Neck:     Musculoskeletal: Normal range of motion.   Cardiovascular:     Rate and Rhythm: Normal rate and regular rhythm.  Pulmonary:     Effort: Pulmonary effort is normal. No respiratory distress.     Breath sounds: Normal breath sounds. No wheezing or rales.  Abdominal:     General: Bowel sounds are normal. There is no distension.     Palpations: Abdomen is soft.     Tenderness: There is no abdominal tenderness. There is no rebound.  Musculoskeletal:        General: Tenderness present.     Comments: Some tenderness shoulder in the biceps region  Skin:    General: Skin is warm and dry.  Neurological:     Mental Status: He is alert and oriented to person, place, and time.     Coordination: Coordination normal.     Vitals:   01/10/19 0821  BP: 130/80  Pulse: 61  Temp: 98 F (36.7 C)  TempSrc: Oral  SpO2: 99%  Weight: 150 lb (68 kg)  Height: 5\' 7"  (1.702 m)    This visit occurred during the SARS-CoV-2 public health emergency.  Safety protocols were in place, including screening questions prior to the visit, additional usage of staff PPE, and extensive cleaning of exam room while observing appropriate contact time as indicated for disinfecting solutions.   Assessment & Plan:

## 2019-01-24 ENCOUNTER — Ambulatory Visit
Admission: RE | Admit: 2019-01-24 | Discharge: 2019-01-24 | Disposition: A | Discharge status: Home / Self Care | Payer: Medicare Other | Source: Ambulatory Visit | Attending: Internal Medicine | Admitting: Internal Medicine

## 2019-01-24 DIAGNOSIS — R200 Anesthesia of skin: Secondary | ICD-10-CM | POA: Diagnosis not present

## 2019-01-24 DIAGNOSIS — R29818 Other symptoms and signs involving the nervous system: Secondary | ICD-10-CM

## 2019-01-28 ENCOUNTER — Other Ambulatory Visit: Payer: Self-pay | Admitting: Neurosurgery

## 2019-01-28 DIAGNOSIS — M5126 Other intervertebral disc displacement, lumbar region: Secondary | ICD-10-CM | POA: Diagnosis not present

## 2019-01-28 DIAGNOSIS — M4316 Spondylolisthesis, lumbar region: Secondary | ICD-10-CM

## 2019-02-01 ENCOUNTER — Ambulatory Visit
Admission: RE | Admit: 2019-02-01 | Discharge: 2019-02-01 | Disposition: A | Discharge status: Home / Self Care | Payer: Medicare Other | Source: Ambulatory Visit | Attending: Neurosurgery | Admitting: Neurosurgery

## 2019-02-01 ENCOUNTER — Other Ambulatory Visit: Payer: Self-pay

## 2019-02-01 DIAGNOSIS — G8929 Other chronic pain: Secondary | ICD-10-CM | POA: Diagnosis not present

## 2019-02-01 DIAGNOSIS — M545 Low back pain: Secondary | ICD-10-CM | POA: Diagnosis not present

## 2019-02-01 DIAGNOSIS — M4316 Spondylolisthesis, lumbar region: Secondary | ICD-10-CM

## 2019-02-01 MED ORDER — METHYLPREDNISOLONE ACETATE 40 MG/ML INJ SUSP (RADIOLOG
120.0000 mg | Freq: Once | INTRAMUSCULAR | Status: AC
  Administered 2019-02-01: 120 mg via EPIDURAL

## 2019-02-01 MED ORDER — IOPAMIDOL (ISOVUE-M 200) INJECTION 41%
1.0000 mL | Freq: Once | INTRAMUSCULAR | Status: AC
  Administered 2019-02-01: 11:00:00 1 mL via EPIDURAL

## 2019-02-01 NOTE — Discharge Instructions (Signed)

## 2019-04-15 ENCOUNTER — Ambulatory Visit: Payer: Medicare Other | Attending: Internal Medicine

## 2019-04-15 ENCOUNTER — Ambulatory Visit: Payer: Medicare Other | Admitting: Podiatry

## 2019-04-15 DIAGNOSIS — Z23 Encounter for immunization: Secondary | ICD-10-CM | POA: Insufficient documentation

## 2019-04-15 NOTE — Progress Notes (Signed)
   Covid-19 Vaccination Clinic  Name:  Delbert Testani    MRN: [id number] DOB: 1940/11/05  04/15/2019  Mr. Zeck was observed post Covid-19 immunization for 15 minutes without incidence. He was provided with Vaccine Information Sheet and instruction to access the V-Safe system.   Mr. Wishard was instructed to call 911 with any severe reactions post vaccine: Marland Kitchen Difficulty breathing  . Swelling of your face and throat  . A fast heartbeat  . A bad rash all over your body  . Dizziness and weakness    Immunizations Administered    Name Date Dose VIS Date Route   Pfizer COVID-19 Vaccine 04/15/2019 10:45 AM 0.3 mL 01/28/2019 Intramuscular   Manufacturer: [place]   Lot: [id number]   [place]: [id number]

## 2019-05-05 DIAGNOSIS — H401133 Primary open-angle glaucoma, bilateral, severe stage: Secondary | ICD-10-CM | POA: Diagnosis not present

## 2019-05-10 ENCOUNTER — Ambulatory Visit: Payer: Medicare Other | Attending: Internal Medicine

## 2019-05-10 DIAGNOSIS — Z23 Encounter for immunization: Secondary | ICD-10-CM

## 2019-05-10 NOTE — Progress Notes (Signed)
   Covid-19 Vaccination Clinic  Name:  Linville Buckert    MRN: [id number] DOB: 1940-03-12  05/10/2019  Mr. Lawley was observed post Covid-19 immunization for 15 minutes without incident. He was provided with Vaccine Information Sheet and instruction to access the V-Safe system.   Mr. Challender was instructed to call 911 with any severe reactions post vaccine: Marland Kitchen Difficulty breathing  . Swelling of face and throat  . A fast heartbeat  . A bad rash all over body  . Dizziness and weakness   Immunizations Administered    Name Date Dose VIS Date Route   Pfizer COVID-19 Vaccine 05/10/2019 10:18 AM 0.3 mL 01/28/2019 Intramuscular   Manufacturer: [place]   Lot: [id number]   [place]: [id number]

## 2019-05-16 ENCOUNTER — Encounter: Payer: Self-pay | Admitting: Podiatry

## 2019-05-16 ENCOUNTER — Other Ambulatory Visit: Payer: Self-pay

## 2019-05-16 ENCOUNTER — Ambulatory Visit: Payer: Medicare Other | Admitting: Podiatry

## 2019-05-16 VITALS — Temp 97.5°F

## 2019-05-16 DIAGNOSIS — M79674 Pain in right toe(s): Secondary | ICD-10-CM | POA: Diagnosis not present

## 2019-05-16 DIAGNOSIS — M79675 Pain in left toe(s): Secondary | ICD-10-CM | POA: Diagnosis not present

## 2019-05-16 DIAGNOSIS — L84 Corns and callosities: Secondary | ICD-10-CM

## 2019-05-16 DIAGNOSIS — B351 Tinea unguium: Secondary | ICD-10-CM | POA: Diagnosis not present

## 2019-05-18 NOTE — Progress Notes (Signed)
Subjective:   Patient ID: Evan Escobar, male   DOB: 79 y.o.   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

## 2019-05-23 ENCOUNTER — Other Ambulatory Visit: Payer: Self-pay | Admitting: Neurosurgery

## 2019-05-23 DIAGNOSIS — M4316 Spondylolisthesis, lumbar region: Secondary | ICD-10-CM

## 2019-05-26 ENCOUNTER — Ambulatory Visit
Admission: RE | Admit: 2019-05-26 | Discharge: 2019-05-26 | Disposition: A | Discharge status: Home / Self Care | Payer: Medicare Other | Source: Ambulatory Visit | Attending: Neurosurgery | Admitting: Neurosurgery

## 2019-05-26 DIAGNOSIS — M47817 Spondylosis without myelopathy or radiculopathy, lumbosacral region: Secondary | ICD-10-CM | POA: Diagnosis not present

## 2019-05-26 DIAGNOSIS — M4316 Spondylolisthesis, lumbar region: Secondary | ICD-10-CM

## 2019-05-26 MED ORDER — METHYLPREDNISOLONE ACETATE 40 MG/ML INJ SUSP (RADIOLOG
120.0000 mg | Freq: Once | INTRAMUSCULAR | Status: AC
  Administered 2019-05-26: 120 mg via EPIDURAL

## 2019-05-26 MED ORDER — IOPAMIDOL (ISOVUE-M 200) INJECTION 41%
1.0000 mL | Freq: Once | INTRAMUSCULAR | Status: AC
  Administered 2019-05-26: 1 mL via EPIDURAL

## 2019-05-26 NOTE — Discharge Instructions (Signed)

## 2019-06-21 ENCOUNTER — Other Ambulatory Visit: Payer: Self-pay | Admitting: Internal Medicine

## 2019-06-21 DIAGNOSIS — I10 Essential (primary) hypertension: Secondary | ICD-10-CM

## 2019-07-14 ENCOUNTER — Ambulatory Visit (INDEPENDENT_AMBULATORY_CARE_PROVIDER_SITE_OTHER): Payer: Medicare Other | Admitting: Internal Medicine

## 2019-07-14 ENCOUNTER — Encounter: Payer: Self-pay | Admitting: Internal Medicine

## 2019-07-14 ENCOUNTER — Other Ambulatory Visit: Payer: Self-pay

## 2019-07-14 VITALS — BP 140/82 | HR 77 | Temp 98.1°F | Ht 67.0 in | Wt 145.0 lb

## 2019-07-14 DIAGNOSIS — Z0000 Encounter for general adult medical examination without abnormal findings: Secondary | ICD-10-CM | POA: Diagnosis not present

## 2019-07-14 DIAGNOSIS — K5900 Constipation, unspecified: Secondary | ICD-10-CM | POA: Diagnosis not present

## 2019-07-14 DIAGNOSIS — D649 Anemia, unspecified: Secondary | ICD-10-CM | POA: Diagnosis not present

## 2019-07-14 DIAGNOSIS — R569 Unspecified convulsions: Secondary | ICD-10-CM

## 2019-07-14 DIAGNOSIS — I10 Essential (primary) hypertension: Secondary | ICD-10-CM | POA: Diagnosis not present

## 2019-07-14 LAB — COMPREHENSIVE METABOLIC PANEL
ALT: 9 U/L (ref 0–53)
AST: 17 U/L (ref 0–37)
Albumin: 4.1 g/dL (ref 3.5–5.2)
Alkaline Phosphatase: 27 U/L — ABNORMAL LOW (ref 39–117)
BUN: 19 mg/dL (ref 6–23)
CO2: 27 mEq/L (ref 19–32)
Calcium: 9.1 mg/dL (ref 8.4–10.5)
Chloride: 101 mEq/L (ref 96–112)
Creatinine, Ser: 1.25 mg/dL (ref 0.40–1.50)
GFR: 67.6 mL/min (ref >60.00)
Glucose, Bld: 120 mg/dL — ABNORMAL HIGH (ref 70–99)
Potassium: 4.2 mEq/L (ref 3.5–5.1)
Sodium: 133 mEq/L — ABNORMAL LOW (ref 135–145)
Total Bilirubin: 0.6 mg/dL (ref 0.2–1.2)
Total Protein: 6.8 g/dL (ref 6.0–8.3)

## 2019-07-14 LAB — CBC
HCT: 35.6 % — ABNORMAL LOW (ref 39.0–52.0)
Hemoglobin: 11.8 g/dL — ABNORMAL LOW (ref 13.0–17.0)
MCHC: 33.2 g/dL (ref 30.0–36.0)
MCV: 85.4 fl (ref 78.0–100.0)
Platelets: 160 10*3/uL (ref 150.0–400.0)
RBC: 4.17 Mil/uL — ABNORMAL LOW (ref 4.22–5.81)
RDW: 13.7 % (ref 11.5–15.5)
WBC: 4.2 10*3/uL (ref 4.0–10.5)

## 2019-07-14 LAB — LIPID PANEL
Cholesterol: 181 mg/dL (ref 0–200)
HDL: 64.5 mg/dL (ref >39.00)
LDL Cholesterol: 97 mg/dL (ref 0–99)
NonHDL: 116.89
Total CHOL/HDL Ratio: 3
Triglycerides: 99 mg/dL (ref 0.0–149.0)
VLDL: 19.8 mg/dL (ref 0.0–40.0)

## 2019-07-14 MED ORDER — LINACLOTIDE 145 MCG PO CAPS: 145.0000 ug | ORAL_CAPSULE | Freq: Every day | ORAL | 1 refills | Status: DC

## 2019-07-14 NOTE — Assessment & Plan Note (Signed)
BP at goal on lisinopril 10 mg daily. Checking CMP and adjust as needed.  

## 2019-07-14 NOTE — Progress Notes (Signed)
Subjective:   Patient ID: Evan Escobar, male    DOB: [birth year], 79 y.o.   MRN: [id number]  HPI Here for medicare wellness and physical, no new complaints. Please see A/P for status and treatment of chronic medical problems.   Diet: heart healthy Physical activity: walking Depression/mood screen: negative Hearing: intact to whispered voice Visual acuity: grossly normal with lens, performs annual eye exam  ADLs: capable Fall risk: none Home safety: good Cognitive evaluation: intact to orientation, naming, recall and repetition EOL planning: adv directives discussed    Office Visit from 07/14/2019 in [place] at [facility] Total Score  0      I have personally reviewed and have noted 1. The patient's medical and social history - reviewed today no changes 2. Their use of alcohol, tobacco or illicit drugs 3. Their current medications and supplements 4. The patient's functional ability including ADL's, fall risks, home safety risks and hearing or visual impairment. 5. Diet and physical activities 6. Evidence for depression or mood disorders 7. Care team reviewed and updated  Patient Care Team: [name], MD as PCP - General (Internal Medicine) Marcial Pacas, MD as Consulting Physician (Neurology) Paulla Dolly Tamala Fothergill, DPM as Consulting Physician (Podiatry) Past Medical History:  Diagnosis Date  . Allergy   . Arthritis   . Cataract   . Glaucoma   . Hypertension   . Normocytic anemia 09/25/2012  . Polyp, sigmoid colon 11/05/2011  . Seizure ([place])   . Seizures ([place]) 03/17/2013   Past Surgical History:  Procedure Laterality Date  . ANTERIOR CERVICAL DECOMP/DISCECTOMY FUSION N/A 05/22/2017   Procedure: Anterior Cervical Discectomy Fusion - Cervical Three-Cervical Four - Cervical Four-Cervical Five;  Surgeon: Kary Kos, MD;  Location: [place];  Service: Neurosurgery;  Laterality: N/A;  . CATARACT EXTRACTION, BILATERAL    . EYE SURGERY    . GLAUCOMA REPAIR   10 yrs ago   Family History  Problem Relation Age of Onset  . Stroke Mother   . High blood pressure Mother   . Stroke Father   . High blood pressure Father   . Colon cancer Brother   . Esophageal cancer Neg Hx   . Pancreatic cancer Neg Hx   . Rectal cancer Neg Hx   . Stomach cancer Neg Hx     Review of Systems  Constitutional: Negative.   HENT: Negative.   Eyes: Negative.   Respiratory: Negative for cough, chest tightness and shortness of breath.   Cardiovascular: Negative for chest pain, palpitations and leg swelling.  Gastrointestinal: Positive for constipation. Negative for abdominal distention, abdominal pain, anal bleeding, blood in stool, diarrhea, nausea and vomiting.  Musculoskeletal: Negative.   Skin: Negative.   Neurological: Negative.   Psychiatric/Behavioral: Negative.     Objective:  Physical Exam Constitutional:      Appearance: He is well-developed.  HENT:     Head: Normocephalic and atraumatic.  Cardiovascular:     Rate and Rhythm: Normal rate and regular rhythm.  Pulmonary:     Effort: Pulmonary effort is normal. No respiratory distress.     Breath sounds: Normal breath sounds. No wheezing or rales.  Abdominal:     General: Bowel sounds are normal. There is no distension.     Palpations: Abdomen is soft.     Tenderness: There is no abdominal tenderness. There is no rebound.  Musculoskeletal:     Cervical back: Normal range of motion.  Skin:    General: Skin is warm and  dry.  Neurological:     Mental Status: He is alert and oriented to person, place, and time.     Coordination: Coordination normal.     Vitals:   07/14/19 0824  BP: 140/82  Pulse: 77  Temp: 98.1 F (36.7 C)  SpO2: 99%  Weight: 145 lb (65.8 kg)  Height: 5\' 7"  (1.702 m)   This visit occurred during the SARS-CoV-2 public health emergency.  Safety protocols were in place, including screening questions prior to the visit, additional usage of staff PPE, and extensive cleaning of  exam room while observing appropriate contact time as indicated for disinfecting solutions.   Assessment & Plan:

## 2019-07-14 NOTE — Assessment & Plan Note (Signed)
Checking CBC and adjust as needed.  

## 2019-07-14 NOTE — Assessment & Plan Note (Signed)
Taking keppra BID sees neurology. Denies seizures.

## 2019-07-14 NOTE — Assessment & Plan Note (Signed)
Rx linzess as otc has not helped. Can still use milk of magnesia if needed. Talked about gas-x or beano for gas.

## 2019-07-14 NOTE — Assessment & Plan Note (Signed)
Flu shot due next season. Covid-19 complete. Pneumonia complete. Shingrix counseled. Tetanus due 2026. Colonoscopy aged out of further. Counseled about sun safety and mole surveillance. Counseled about the dangers of distracted driving. Given 10 year screening recommendations.

## 2019-07-14 NOTE — Patient Instructions (Addendum)
We will check the blood work today. We have sent in linzess to try taking daily to help the bowels move more regular. For gas you can try gas-x or beano over the counter.   Health Maintenance, Male Adopting a healthy lifestyle and getting preventive care are important in promoting health and wellness. Ask your health care provider about:  The right schedule for you to have regular tests and exams.  Things you can do on your own to prevent diseases and keep yourself healthy. What should I know about diet, weight, and exercise? Eat a healthy diet   Eat a diet that includes plenty of vegetables, fruits, low-fat dairy products, and lean protein.  Do not eat a lot of foods that are high in solid fats, added sugars, or sodium. Maintain a healthy weight Body mass index (BMI) is a measurement that can be used to identify possible weight problems. It estimates body fat based on height and weight. Your health care provider can help determine your BMI and help you achieve or maintain a healthy weight. Get regular exercise Get regular exercise. This is one of the most important things you can do for your health. Most adults should:  Exercise for at least 150 minutes each week. The exercise should increase your heart rate and make you sweat (moderate-intensity exercise).  Do strengthening exercises at least twice a week. This is in addition to the moderate-intensity exercise.  Spend less time sitting. Even light physical activity can be beneficial. Watch cholesterol and blood lipids Have your blood tested for lipids and cholesterol at 78 years of age, then have this test every 5 years. You may need to have your cholesterol levels checked more often if:  Your lipid or cholesterol levels are high.  You are older than 78 years of age.  You are at high risk for heart disease. What should I know about cancer screening? Many types of cancers can be detected early and may often be prevented. Depending  on your health history and family history, you may need to have cancer screening at various ages. This may include screening for:  Colorectal cancer.  Prostate cancer.  Skin cancer.  Lung cancer. What should I know about heart disease, diabetes, and high blood pressure? Blood pressure and heart disease  High blood pressure causes heart disease and increases the risk of stroke. This is more likely to develop in people who have high blood pressure readings, are of African descent, or are overweight.  Talk with your health care provider about your target blood pressure readings.  Have your blood pressure checked: ? Every 3-5 years if you are 45-44 years of age. ? Every year if you are 73 years old or older.  If you are between the ages of 17 and 23 and are a current or former smoker, ask your health care provider if you should have a one-time screening for abdominal aortic aneurysm (AAA). Diabetes Have regular diabetes screenings. This checks your fasting blood sugar level. Have the screening done:  Once every three years after age 22 if you are at a normal weight and have a low risk for diabetes.  More often and at a younger age if you are overweight or have a high risk for diabetes. What should I know about preventing infection? Hepatitis B If you have a higher risk for hepatitis B, you should be screened for this virus. Talk with your health care provider to find out if you are at risk for hepatitis  B infection. Hepatitis C Blood testing is recommended for:  Everyone born from [birth year] through 1964.  Anyone with known risk factors for hepatitis C. Sexually transmitted infections (STIs)  You should be screened each year for STIs, including gonorrhea and chlamydia, if: ? You are sexually active and are younger than 78 years of age. ? You are older than 78 years of age and your health care provider tells you that you are at risk for this type of infection. ? Your sexual activity has  changed since you were last screened, and you are at increased risk for chlamydia or gonorrhea. Ask your health care provider if you are at risk.  Ask your health care provider about whether you are at high risk for HIV. Your health care provider may recommend a prescription medicine to help prevent HIV infection. If you choose to take medicine to prevent HIV, you should first get tested for HIV. You should then be tested every 3 months for as long as you are taking the medicine. Follow these instructions at home: Lifestyle  Do not use any products that contain nicotine or tobacco, such as cigarettes, e-cigarettes, and chewing tobacco. If you need help quitting, ask your health care provider.  Do not use street drugs.  Do not share needles.  Ask your health care provider for help if you need support or information about quitting drugs. Alcohol use  Do not drink alcohol if your health care provider tells you not to drink.  If you drink alcohol: ? Limit how much you have to 0-2 drinks a day. ? Be aware of how much alcohol is in your drink. In the [place] one drink equals one 12 oz bottle of beer (355 mL), one 5 oz glass of wine (148 mL), or one 1 oz glass of hard liquor (44 mL). General instructions  Schedule regular health, dental, and eye exams.  Stay current with your vaccines.  Tell your health care provider if: ? You often feel depressed. ? You have ever been abused or do not feel safe at home. Summary  Adopting a healthy lifestyle and getting preventive care are important in promoting health and wellness.  Follow your health care provider's instructions about healthy diet, exercising, and getting tested or screened for diseases.  Follow your health care provider's instructions on monitoring your cholesterol and blood pressure. This information is not intended to replace advice given to you by your health care provider. Make sure you discuss any questions you have with your  health care provider. Document Revised: 01/27/2018 Document Reviewed: 01/27/2018 Elsevier Patient Education  2020 [employer].

## 2019-07-20 ENCOUNTER — Telehealth: Payer: Self-pay

## 2019-07-20 NOTE — Telephone Encounter (Signed)
Patient said that he hasn't had a bowel movement in 4 days.  The medication that was prescribed hasn't helped.  Can another med be sent to pharmacy for him?

## 2019-07-21 NOTE — Telephone Encounter (Signed)
I do not see what medication in note. Are we talking about linzess? He should try 2 linzess daily to see if this helps more and can take magnesium citrate otc to help have BM now.

## 2019-07-21 NOTE — Telephone Encounter (Signed)
Spoke with patient explain message how to take medication, voiced understanding.

## 2019-09-08 DIAGNOSIS — H401133 Primary open-angle glaucoma, bilateral, severe stage: Secondary | ICD-10-CM | POA: Diagnosis not present

## 2019-10-13 ENCOUNTER — Ambulatory Visit: Payer: Medicare Other | Admitting: Internal Medicine

## 2019-10-13 ENCOUNTER — Other Ambulatory Visit: Payer: Self-pay

## 2019-10-13 ENCOUNTER — Encounter: Payer: Self-pay | Admitting: Internal Medicine

## 2019-10-13 ENCOUNTER — Ambulatory Visit (INDEPENDENT_AMBULATORY_CARE_PROVIDER_SITE_OTHER): Payer: Medicare Other

## 2019-10-13 ENCOUNTER — Ambulatory Visit (INDEPENDENT_AMBULATORY_CARE_PROVIDER_SITE_OTHER): Payer: Medicare Other | Admitting: Internal Medicine

## 2019-10-13 VITALS — BP 136/86 | HR 74 | Temp 98.2°F | Resp 16 | Ht 67.0 in | Wt 142.2 lb

## 2019-10-13 DIAGNOSIS — R10815 Periumbilic abdominal tenderness: Secondary | ICD-10-CM

## 2019-10-13 DIAGNOSIS — I10 Essential (primary) hypertension: Secondary | ICD-10-CM

## 2019-10-13 DIAGNOSIS — D508 Other iron deficiency anemias: Secondary | ICD-10-CM

## 2019-10-13 DIAGNOSIS — K5904 Chronic idiopathic constipation: Secondary | ICD-10-CM

## 2019-10-13 DIAGNOSIS — D539 Nutritional anemia, unspecified: Secondary | ICD-10-CM

## 2019-10-13 DIAGNOSIS — R053 Chronic cough: Secondary | ICD-10-CM | POA: Insufficient documentation

## 2019-10-13 DIAGNOSIS — R059 Cough, unspecified: Secondary | ICD-10-CM

## 2019-10-13 DIAGNOSIS — R05 Cough: Secondary | ICD-10-CM

## 2019-10-13 DIAGNOSIS — R634 Abnormal weight loss: Secondary | ICD-10-CM

## 2019-10-13 DIAGNOSIS — N410 Acute prostatitis: Secondary | ICD-10-CM

## 2019-10-13 DIAGNOSIS — R109 Unspecified abdominal pain: Secondary | ICD-10-CM | POA: Diagnosis not present

## 2019-10-13 NOTE — Patient Instructions (Signed)
Abdominal Pain, Adult Pain in the abdomen (abdominal pain) can be caused by many things. Often, abdominal pain is not serious and it gets better with no treatment or by being treated at home. However, sometimes abdominal pain is serious. Your health care provider will ask questions about your medical history and do a physical exam to try to determine the cause of your abdominal pain. Follow these instructions at home:  Medicines  Take over-the-counter and prescription medicines only as told by your health care provider.  Do not take a laxative unless told by your health care provider. General instructions  Watch your condition for any changes.  Drink enough fluid to keep your urine pale yellow.  Keep all follow-up visits as told by your health care provider. This is important. Contact a health care provider if:  Your abdominal pain changes or gets worse.  You are not hungry or you lose weight without trying.  You are constipated or have diarrhea for more than 2-3 days.  You have pain when you urinate or have a bowel movement.  Your abdominal pain wakes you up at night.  Your pain gets worse with meals, after eating, or with certain foods.  You are vomiting and cannot keep anything down.  You have a fever.  You have blood in your urine. Get help right away if:  Your pain does not go away as soon as your health care provider told you to expect.  You cannot stop vomiting.  Your pain is only in areas of the abdomen, such as the right side or the left lower portion of the abdomen. Pain on the right side could be caused by appendicitis.  You have bloody or black stools, or stools that look like tar.  You have severe pain, cramping, or bloating in your abdomen.  You have signs of dehydration, such as: ? Dark urine, very little urine, or no urine. ? Cracked lips. ? Dry mouth. ? Sunken eyes. ? Sleepiness. ? Weakness.  You have trouble breathing or chest  pain. Summary  Often, abdominal pain is not serious and it gets better with no treatment or by being treated at home. However, sometimes abdominal pain is serious.  Watch your condition for any changes.  Take over-the-counter and prescription medicines only as told by your health care provider.  Contact a health care provider if your abdominal pain changes or gets worse.  Get help right away if you have severe pain, cramping, or bloating in your abdomen. This information is not intended to replace advice given to you by your health care provider. Make sure you discuss any questions you have with your health care provider. Document Revised: 06/14/2018 Document Reviewed: 06/14/2018 Elsevier Patient Education  2020 [employer]  

## 2019-10-13 NOTE — Progress Notes (Signed)
Subjective:  Patient ID: Ramzy Cappelletti, male    DOB: Jun 27, 1940  Age: 79 y.o. MRN: [id number]  CC: Abdominal Pain, Cough, and Anemia  This visit occurred during the SARS-CoV-2 public health emergency.  Safety protocols were in place, including screening questions prior to the visit, additional usage of staff PPE, and extensive cleaning of exam room while observing appropriate contact time as indicated for disinfecting solutions.    HPI Jerrico Covello presents for f/up - He complains of feeling poorly for about 3 months with nonproductive cough, loss of appetite, abd pain., weight loss, insomnia, constipation, dysuria, urinary hesitancy, and a few episodes of diarrhea.  Outpatient Medications Prior to Visit  Medication Sig Dispense Refill  . acetaminophen (TYLENOL) 500 MG tablet Take 500 mg by mouth every 6 (six) hours as needed for moderate pain or headache.     Marland Kitchen aspirin 81 MG tablet Take 81 mg by mouth daily.    . brimonidine (ALPHAGAN P) 0.1 % SOLN Place 1 drop 3 (three) times daily into both eyes.    . cycloSPORINE (RESTASIS) 0.05 % ophthalmic emulsion Place 1 drop into both eyes 2 (two) times daily.    Marland Kitchen latanoprost (XALATAN) 0.005 % ophthalmic solution Place 1 drop into both eyes at bedtime.     . levETIRAcetam (KEPPRA) 500 MG tablet Take 1 tablet (500 mg total) by mouth 2 (two) times daily. 180 tablet 3  . linaclotide (LINZESS) 145 MCG CAPS capsule Take 1 capsule (145 mcg total) by mouth daily before breakfast. 90 capsule 1  . lisinopril (ZESTRIL) 10 MG tablet TAKE 1 TABLET(10 MG) BY MOUTH DAILY 90 tablet 1  . magnesium hydroxide (MILK OF MAGNESIA) 400 MG/5ML suspension Take 30 mLs by mouth daily as needed for mild constipation.    . Multiple Vitamin (MULTIVITAMIN) tablet Take 1 tablet by mouth daily.    . tamsulosin (FLOMAX) 0.4 MG CAPS capsule Take 1 capsule (0.4 mg total) by mouth daily. 90 capsule 3  . Aspirin-Calcium Carbonate 81-777 MG TABS Take by mouth.    . gabapentin  (NEURONTIN) 100 MG capsule Take 1 capsule (100 mg total) by mouth 2 (two) times daily. 60 capsule 0  . neomycin-polymyxin-hydrocortisone (CORTISPORIN) 3.5-10000-1 OTIC suspension Place 3 drops into the right ear 3 (three) times daily. 10 mL 0  . neomycin-polymyxin-hydrocortisone (CORTISPORIN) OTIC solution Apply 1 to 2 drops to toe BID after soaking 10 mL 0   No facility-administered medications prior to visit.    ROS Review of Systems  Constitutional: Positive for appetite change and unexpected weight change. Negative for activity change, chills, diaphoresis, fatigue and fever.  HENT: Negative.   Eyes: Negative for visual disturbance.  Respiratory: Positive for cough. Negative for chest tightness, shortness of breath and wheezing.   Cardiovascular: Negative for chest pain, palpitations and leg swelling.  Gastrointestinal: Positive for abdominal pain. Negative for constipation, diarrhea, nausea and vomiting.  Genitourinary: Positive for difficulty urinating and dysuria. Negative for flank pain, hematuria, testicular pain and urgency.  Musculoskeletal: Negative.  Negative for arthralgias and myalgias.  Skin: Negative.  Negative for color change, pallor and rash.  Neurological: Negative.  Negative for dizziness, weakness, light-headedness and headaches.  Hematological: Negative for adenopathy. Does not bruise/bleed easily.  Psychiatric/Behavioral: Negative.     Objective:  BP 136/86 (BP Location: Left Arm, Patient Position: Sitting, Cuff Size: Normal)   Pulse 74   Temp 98.2 F (36.8 C) (Oral)   Resp 16   Ht '5\' 7"'  (1.702 m)   Wt 142  lb 4 oz (64.5 kg)   SpO2 94%   BMI 22.28 kg/m   BP Readings from Last 3 Encounters:  10/13/19 136/86  07/14/19 140/82  05/26/19 (!) 156/69    Wt Readings from Last 3 Encounters:  10/13/19 142 lb 4 oz (64.5 kg)  07/14/19 145 lb (65.8 kg)  01/10/19 150 lb (68 kg)    Physical Exam Vitals reviewed.  Constitutional:      General: He is not in  acute distress.    Appearance: He is well-developed. He is not ill-appearing, toxic-appearing or diaphoretic.  HENT:     Mouth/Throat:     Mouth: Mucous membranes are moist.  Eyes:     General: No scleral icterus.    Extraocular Movements: Extraocular movements intact.  Cardiovascular:     Rate and Rhythm: Normal rate and regular rhythm.     Heart sounds: Normal heart sounds. No murmur heard.   Pulmonary:     Effort: Pulmonary effort is normal.     Breath sounds: No stridor. No wheezing, rhonchi or rales.  Abdominal:     General: Abdomen is flat. There is no distension.     Palpations: Abdomen is soft. There is no hepatomegaly, splenomegaly or mass.     Tenderness: There is abdominal tenderness in the periumbilical area.     Hernia: No hernia is present.  Musculoskeletal:        General: Normal range of motion.     Right lower leg: No edema.     Left lower leg: No edema.  Skin:    General: Skin is warm and dry.  Neurological:     General: No focal deficit present.     Mental Status: He is alert.  Psychiatric:        Mood and Affect: Mood normal.        Behavior: Behavior normal.     Lab Results  Component Value Date   WBC 4.1 10/13/2019   HGB 11.7 (L) 10/13/2019   HCT 35.8 (L) 10/13/2019   PLT 159 10/13/2019   GLUCOSE 88 10/13/2019   CHOL 181 07/14/2019   TRIG 99.0 07/14/2019   HDL 64.50 07/14/2019   LDLCALC 97 07/14/2019   ALT 7 (L) 10/13/2019   AST 15 10/13/2019   NA 136 10/13/2019   K 4.3 10/13/2019   CL 101 10/13/2019   CREATININE 1.28 (H) 10/13/2019   BUN 15 10/13/2019   CO2 30 10/13/2019   TSH 0.96 10/13/2019   PSA 3.27 11/10/2017   HGBA1C 5.8 01/10/2019    DG Epidural/Nerve Root  Result Date: 05/26/2019 CLINICAL DATA:  Lumbosacral spondylosis without myelopathy. Chronic low back and left greater than right leg pain. Good response to previous left L4-5 transforaminal epidural injections. EXAM: EPIDURAL/NERVE ROOT FLUOROSCOPY TIME:  Fluoroscopy Time:  21 seconds Radiation Exposure Index: 9.48 microGray*m^2 PROCEDURE: The procedure, risks, benefits, and alternatives were explained to the patient. Questions regarding the procedure were encouraged and answered. The patient understands and consents to the procedure. LEFT L4 NERVE ROOT BLOCK AND TRANSFORAMINAL EPIDURAL: A posterior oblique approach was taken to the intervertebral foramen on the left at L4-5 using a curved 3.5 inch 22 gauge spinal needle. Injection of Isovue-M 200 outlined the left L4 nerve root and showed good epidural spread. No vascular opacification is seen. 120 mg of Depo-Medrol mixed with 2 mL of 1% lidocaine were instilled. The procedure was well-tolerated, and the patient was discharged thirty minutes following the injection in good condition. COMPLICATIONS: None IMPRESSION: Technically  successful injection consisting of a left L4 nerve root block and transforaminal epidural. Electronically Signed   By: Logan Bores M.D.   On: 05/26/2019 11:37   CLINICAL DATA:  Abdominal pain. Cough. Weight loss. Patient reports chronic constipation and 20 pound unintentional weight loss.  EXAM: DG ABDOMEN ACUTE W/ 1V CHEST  COMPARISON:  Abdominal radiograph 06/23/2016  FINDINGS: The cardiomediastinal contours are normal. The lungs are clear. No radiographic evidence of pulmonary mass. No pleural fluid.  There is no free intra-abdominal air. No dilated bowel loops to suggest obstruction. Moderate stool in the right colon, small volume of stool distally. Slight air-filled left colon without obstruction. Stable pelvic calcifications typical of phleboliths. No acute osseous abnormalities are seen.  IMPRESSION: No radiograph explanation for abdominal pain, cough, or weight loss. Consider further evaluation with CT based on clinical concern.   Electronically Signed   By: Keith Rake M.D.   On: 10/13/2019 22:39  Assessment & Plan:   Vail was seen today for abdominal pain,  cough and anemia.  Diagnoses and all orders for this visit:  Deficiency anemia- His H&H are low and he has a low iron level.  I recommended that he start taking an iron supplement. -     CBC with Differential/Platelet; Future -     Vitamin B12; Future -     Iron; Future -     Folate; Future -     Ferritin; Future -     Ferritin -     Folate -     Iron -     Vitamin B12 -     CBC with Differential/Platelet  Periumbilical abdominal tenderness without rebound tenderness- He has abdominal pain and a myriad of other symptoms.  The abdominal examination is not consistent with an acute abdominal process.  Labs and x-ray are reassuring with the exception of pyuria.  I will treat him for acute prostatitis. -     Lipase; Future -     Amylase; Future -     Urinalysis, Routine w reflex microscopic; Future -     Hepatic function panel; Future -     DG ABD ACUTE 2+V W 1V CHEST; Future -     Hepatic function panel -     Urinalysis, Routine w reflex microscopic -     Amylase -     Lipase  Cough- The examination and x-ray are reassuring. -     DG ABD ACUTE 2+V W 1V CHEST; Future  Chronic idiopathic constipation- Testing for secondary causes is unremarkable. -     BASIC METABOLIC PANEL WITH GFR; Future -     TSH; Future -     Magnesium; Future -     Magnesium -     TSH -     BASIC METABOLIC PANEL WITH GFR  Essential hypertension, benign- His blood pressure is well controlled.  Electrolytes and renal function are normal. -     BASIC METABOLIC PANEL WITH GFR; Future -     TSH; Future -     Urinalysis, Routine w reflex microscopic; Future -     Urinalysis, Routine w reflex microscopic -     TSH -     BASIC METABOLIC PANEL WITH GFR  Weight loss, abnormal- I do not see any secondary causes for this. -     TSH; Future -     DG ABD ACUTE 2+V W 1V CHEST; Future -     TSH  Iron deficiency anemia secondary  to inadequate dietary iron intake -     ferrous sulfate 325 (65 FE) MG tablet; Take 1  tablet (325 mg total) by mouth 2 (two) times daily with a meal.  Acute prostatitis -     sulfamethoxazole-trimethoprim (BACTRIM DS) 800-160 MG tablet; Take 1 tablet by mouth 2 (two) times daily for 21 days.  Other orders -     MICROSCOPIC MESSAGE   I have discontinued Jeneen Rinks Kleen's neomycin-polymyxin-hydrocortisone, Aspirin-Calcium Carbonate, neomycin-polymyxin-hydrocortisone, and gabapentin. I am also having him start on ferrous sulfate and sulfamethoxazole-trimethoprim. Additionally, I am having him maintain his aspirin, multivitamin, latanoprost, cycloSPORINE, brimonidine, acetaminophen, magnesium hydroxide, levETIRAcetam, tamsulosin, lisinopril, and linaclotide.  Meds ordered this encounter  Medications  . ferrous sulfate 325 (65 FE) MG tablet    Sig: Take 1 tablet (325 mg total) by mouth 2 (two) times daily with a meal.    Dispense:  180 tablet    Refill:  0  . sulfamethoxazole-trimethoprim (BACTRIM DS) 800-160 MG tablet    Sig: Take 1 tablet by mouth 2 (two) times daily for 21 days.    Dispense:  42 tablet    Refill:  0   I spent 50 minutes in preparing to see the patient by review of recent labs, imaging and procedures, obtaining and reviewing separately obtained history, communicating with the patient and family or caregiver, ordering medications, tests or procedures, and documenting clinical information in the EHR including the differential Dx, treatment, and any further evaluation and other management of 1. Deficiency anemia 2. Periumbilical abdominal tenderness without rebound tenderness 3. Cough 4. Chronic idiopathic constipation 5. Essential hypertension, benign 6. Weight loss, abnormal 7. Iron deficiency anemia secondary to inadequate dietary iron intake 8. Acute prostatitis     Follow-up: Return in about 3 weeks (around 11/03/2019).  Scarlette Calico, MD

## 2019-10-14 ENCOUNTER — Telehealth: Payer: Self-pay | Admitting: Internal Medicine

## 2019-10-14 DIAGNOSIS — D508 Other iron deficiency anemias: Secondary | ICD-10-CM | POA: Insufficient documentation

## 2019-10-14 DIAGNOSIS — R634 Abnormal weight loss: Secondary | ICD-10-CM | POA: Insufficient documentation

## 2019-10-14 LAB — FOLATE: Folate: 18.1 ng/mL

## 2019-10-14 LAB — URINALYSIS, ROUTINE W REFLEX MICROSCOPIC
Bilirubin Urine: NEGATIVE
Glucose, UA: NEGATIVE
Hgb urine dipstick: NEGATIVE
Hyaline Cast: NONE SEEN /LPF
Ketones, ur: NEGATIVE
Nitrite: POSITIVE — AB
Protein, ur: NEGATIVE
RBC / HPF: NONE SEEN /HPF (ref 0–2)
Specific Gravity, Urine: 1.01 (ref 1.001–1.03)
Squamous Epithelial / HPF: NONE SEEN /HPF (ref <=5)
pH: 6 (ref 5.0–8.0)

## 2019-10-14 LAB — CBC WITH DIFFERENTIAL/PLATELET
Absolute Monocytes: 656 cells/uL (ref 200–950)
Basophils Absolute: 29 cells/uL (ref 0–200)
Basophils Relative: 0.7 %
Eosinophils Absolute: 123 cells/uL (ref 15–500)
Eosinophils Relative: 3 %
HCT: 35.8 % — ABNORMAL LOW (ref 38.5–50.0)
Hemoglobin: 11.7 g/dL — ABNORMAL LOW (ref 13.2–17.1)
Lymphs Abs: 1939 cells/uL (ref 850–3900)
MCH: 28.5 pg (ref 27.0–33.0)
MCHC: 32.7 g/dL (ref 32.0–36.0)
MCV: 87.3 fL (ref 80.0–100.0)
MPV: 11.8 fL (ref 7.5–12.5)
Monocytes Relative: 16 %
Neutro Abs: 1353 cells/uL — ABNORMAL LOW (ref 1500–7800)
Neutrophils Relative %: 33 %
Platelets: 159 10*3/uL (ref 140–400)
RBC: 4.1 10*6/uL — ABNORMAL LOW (ref 4.20–5.80)
RDW: 12.3 % (ref 11.0–15.0)
Total Lymphocyte: 47.3 %
WBC: 4.1 10*3/uL (ref 3.8–10.8)

## 2019-10-14 LAB — HEPATIC FUNCTION PANEL
AG Ratio: 1.7 (calc) (ref 1.0–2.5)
ALT: 7 U/L — ABNORMAL LOW (ref 9–46)
AST: 15 U/L (ref 10–35)
Albumin: 4.5 g/dL (ref 3.6–5.1)
Alkaline phosphatase (APISO): 32 U/L — ABNORMAL LOW (ref 35–144)
Bilirubin, Direct: 0.1 mg/dL (ref 0.0–0.2)
Globulin: 2.6 g/dL (calc) (ref 1.9–3.7)
Indirect Bilirubin: 0.3 mg/dL (calc) (ref 0.2–1.2)
Total Bilirubin: 0.4 mg/dL (ref 0.2–1.2)
Total Protein: 7.1 g/dL (ref 6.1–8.1)

## 2019-10-14 LAB — BASIC METABOLIC PANEL WITH GFR
BUN/Creatinine Ratio: 12 (calc) (ref 6–22)
BUN: 15 mg/dL (ref 7–25)
CO2: 30 mmol/L (ref 20–32)
Calcium: 9.6 mg/dL (ref 8.6–10.3)
Chloride: 101 mmol/L (ref 98–110)
Creat: 1.28 mg/dL — ABNORMAL HIGH (ref 0.70–1.18)
GFR, Est African American: 62 mL/min/{1.73_m2} (ref >=60)
GFR, Est Non African American: 53 mL/min/{1.73_m2} — ABNORMAL LOW (ref >=60)
Glucose, Bld: 88 mg/dL (ref 65–99)
Potassium: 4.3 mmol/L (ref 3.5–5.3)
Sodium: 136 mmol/L (ref 135–146)

## 2019-10-14 LAB — AMYLASE: Amylase: 69 U/L (ref 21–101)

## 2019-10-14 LAB — VITAMIN B12: Vitamin B-12: 870 pg/mL (ref 200–1100)

## 2019-10-14 LAB — IRON: Iron: 46 ug/dL — ABNORMAL LOW (ref 50–180)

## 2019-10-14 LAB — MAGNESIUM: Magnesium: 2.1 mg/dL (ref 1.5–2.5)

## 2019-10-14 LAB — FERRITIN: Ferritin: 110 ng/mL (ref 24–380)

## 2019-10-14 LAB — TSH: TSH: 0.96 mIU/L (ref 0.40–4.50)

## 2019-10-14 LAB — LIPASE: Lipase: 14 U/L (ref 7–60)

## 2019-10-14 MED ORDER — FERROUS SULFATE 325 (65 FE) MG PO TABS: 325.0000 mg | ORAL_TABLET | Freq: Two times a day (BID) | ORAL | 0 refills | Status: DC

## 2019-10-14 NOTE — Telephone Encounter (Signed)
Patient awaiting call in reference to labs from yesterday.  Requesting a call with results

## 2019-10-17 DIAGNOSIS — N410 Acute prostatitis: Secondary | ICD-10-CM | POA: Insufficient documentation

## 2019-10-17 MED ORDER — SULFAMETHOXAZOLE-TRIMETHOPRIM 800-160 MG PO TABS: 1.0000 | ORAL_TABLET | Freq: Two times a day (BID) | ORAL | 0 refills | Status: AC

## 2019-10-17 NOTE — Telephone Encounter (Signed)
See result notes for labs drawn on 10/13/19. Pt has been called. Will close this encounter.

## 2019-10-19 ENCOUNTER — Telehealth: Payer: Self-pay

## 2019-10-19 NOTE — Telephone Encounter (Signed)
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

## 2019-10-19 NOTE — Telephone Encounter (Signed)
LVM asking pt to return phone call to discuss Dr Ronnald Ramp response to his urinary symptoms.  See result notes for background.

## 2019-10-19 NOTE — Telephone Encounter (Signed)
"  think he has a prostate gland infection. I have sent an antibiotic to his pharmacy to treat this. Ask him to f/u soon for a recheck" The above shared with patient.  Pt states he started abx yesterday.  F/u appt made for 9/28.

## 2019-10-19 NOTE — Telephone Encounter (Signed)
New Message:   Pt is returning call per instruction. Please return call.

## 2019-10-25 ENCOUNTER — Telehealth: Payer: Self-pay | Admitting: Neurology

## 2019-10-25 MED ORDER — LEVETIRACETAM 500 MG PO TABS
500.0000 mg | ORAL_TABLET | Freq: Two times a day (BID) | ORAL | 0 refills | Status: DC
Start: 2019-10-25 — End: 2019-11-28

## 2019-10-25 NOTE — Telephone Encounter (Signed)
Patient needs refill of Keppra to go to [facility] on [address] [phone]. He made an apt for Oct. Best call back is [phone]

## 2019-10-25 NOTE — Telephone Encounter (Signed)
RX sent, LVM

## 2019-11-07 ENCOUNTER — Telehealth: Payer: Self-pay | Admitting: Internal Medicine

## 2019-11-07 NOTE — Telephone Encounter (Signed)
I have called the pt to follow up with him and get clarification on what this message was in regard to. He stated that on the AVS not to take a laxative but stated that he suffers from chronic constipation. I put the patient on a brief hold so I could go read the AVS from his recent 8/26 visit with Dr. Ronnald Ramp and I do not see where that is stated. Pt states that he has a follow up with Dr. Ronnald Ramp next week. I have informed him that we do have milk of magnesia listed on his medication list for constipation and he can proceed with continued use when needed. He expressed understanding.

## 2019-11-07 NOTE — Telephone Encounter (Signed)
Patient told not to take a laxative until he talks to his Provider. Patient needs to speak with someone about this to

## 2019-11-15 ENCOUNTER — Other Ambulatory Visit: Payer: Self-pay

## 2019-11-15 ENCOUNTER — Ambulatory Visit (INDEPENDENT_AMBULATORY_CARE_PROVIDER_SITE_OTHER): Payer: Medicare Other | Admitting: Internal Medicine

## 2019-11-15 ENCOUNTER — Encounter: Payer: Self-pay | Admitting: Internal Medicine

## 2019-11-15 VITALS — BP 138/82 | HR 66 | Temp 98.2°F | Resp 16 | Ht 67.0 in | Wt 144.0 lb

## 2019-11-15 DIAGNOSIS — Z23 Encounter for immunization: Secondary | ICD-10-CM

## 2019-11-15 DIAGNOSIS — K5904 Chronic idiopathic constipation: Secondary | ICD-10-CM | POA: Diagnosis not present

## 2019-11-15 DIAGNOSIS — R8281 Pyuria: Secondary | ICD-10-CM | POA: Diagnosis not present

## 2019-11-15 MED ORDER — LINACLOTIDE 145 MCG PO CAPS: 145.0000 ug | ORAL_CAPSULE | Freq: Every day | ORAL | 1 refills | Status: DC

## 2019-11-15 NOTE — Progress Notes (Signed)
Subjective:  Patient ID: Evan Escobar, male    DOB: [birth year]  Age: 79 y.o. MRN: [id number]  CC: Urinary Tract Infection and Constipation  This visit occurred during the SARS-CoV-2 public health emergency.  Safety protocols were in place, including screening questions prior to the visit, additional usage of staff PPE, and extensive cleaning of exam room while observing appropriate contact time as indicated for disinfecting solutions.    HPI Damarco Keysor presents for f/up - He recently completed a course of Bactrim DS for the treatment of acute prostatitis.  At the time he had abdominal pain.  He tells me the abdominal pain has resolved and his only symptom at this time is constipation.  He denies dysuria or hematuria.  Outpatient Medications Prior to Visit  Medication Sig Dispense Refill  . acetaminophen (TYLENOL) 500 MG tablet Take 500 mg by mouth every 6 (six) hours as needed for moderate pain or headache.     Marland Kitchen aspirin 81 MG tablet Take 81 mg by mouth daily.    . brimonidine (ALPHAGAN P) 0.1 % SOLN Place 1 drop 3 (three) times daily into both eyes.    . cycloSPORINE (RESTASIS) 0.05 % ophthalmic emulsion Place 1 drop into both eyes 2 (two) times daily.    . ferrous sulfate 325 (65 FE) MG tablet Take 1 tablet (325 mg total) by mouth 2 (two) times daily with a meal. 180 tablet 0  . latanoprost (XALATAN) 0.005 % ophthalmic solution Place 1 drop into both eyes at bedtime.     . levETIRAcetam (KEPPRA) 500 MG tablet Take 1 tablet (500 mg total) by mouth 2 (two) times daily. 180 tablet 0  . lisinopril (ZESTRIL) 10 MG tablet TAKE 1 TABLET(10 MG) BY MOUTH DAILY 90 tablet 1  . Multiple Vitamin (MULTIVITAMIN) tablet Take 1 tablet by mouth daily.    . tamsulosin (FLOMAX) 0.4 MG CAPS capsule Take 1 capsule (0.4 mg total) by mouth daily. 90 capsule 3  . linaclotide (LINZESS) 145 MCG CAPS capsule Take 1 capsule (145 mcg total) by mouth daily before breakfast. 90 capsule 1  . magnesium hydroxide  (MILK OF MAGNESIA) 400 MG/5ML suspension Take 30 mLs by mouth daily as needed for mild constipation.     No facility-administered medications prior to visit.    ROS Review of Systems  Constitutional: Negative.  Negative for chills, fatigue and fever.  HENT: Negative.  Negative for sore throat and trouble swallowing.   Eyes: Negative for visual disturbance.  Respiratory: Negative for cough, chest tightness, shortness of breath and wheezing.   Cardiovascular: Negative for chest pain, palpitations and leg swelling.  Gastrointestinal: Positive for constipation. Negative for abdominal pain, blood in stool, diarrhea, nausea and vomiting.  Genitourinary: Negative.  Negative for difficulty urinating, discharge, dysuria, flank pain, hematuria, scrotal swelling, testicular pain and urgency.  Musculoskeletal: Negative.   Skin: Negative.  Negative for color change and rash.  Neurological: Negative.  Negative for dizziness, weakness, light-headedness and headaches.  Hematological: Negative for adenopathy. Does not bruise/bleed easily.  Psychiatric/Behavioral: Negative.     Objective:  BP 138/82   Pulse 66   Temp 98.2 F (36.8 C) (Oral)   Resp 16   Ht 5\' 7"  (1.702 m)   Wt 144 lb (65.3 kg)   SpO2 99%   BMI 22.55 kg/m   BP Readings from Last 3 Encounters:  11/15/19 138/82  10/13/19 136/86  07/14/19 140/82    Wt Readings from Last 3 Encounters:  11/15/19 144 lb (65.3 kg)  10/13/19 142 lb 4 oz (64.5 kg)  07/14/19 145 lb (65.8 kg)    Physical Exam Constitutional:      Appearance: Normal appearance.  HENT:     Nose: Nose normal.     Mouth/Throat:     Mouth: Mucous membranes are moist.  Eyes:     General: No scleral icterus.    Conjunctiva/sclera: Conjunctivae normal.  Cardiovascular:     Rate and Rhythm: Normal rate and regular rhythm.     Heart sounds: No murmur heard.   Pulmonary:     Effort: Pulmonary effort is normal.     Breath sounds: No stridor. No wheezing, rhonchi or  rales.  Abdominal:     General: Abdomen is flat. Bowel sounds are normal. There is no distension.     Palpations: Abdomen is soft. There is no hepatomegaly, splenomegaly or mass.     Tenderness: There is no abdominal tenderness.     Hernia: There is no hernia in the left inguinal area or right inguinal area.  Genitourinary:    Pubic Area: No rash.      Penis: Normal and circumcised. No discharge, swelling or lesions.      Testes:        Right: Testicular hydrocele present. Mass, tenderness, swelling or varicocele not present.        Left: Mass, tenderness, swelling, testicular hydrocele or varicocele not present.     Epididymis:     Right: Not inflamed or enlarged. No mass.     Left: Not inflamed or enlarged. No mass.     Prostate: Enlarged (1+ smooth symm BPH). Not tender and no nodules present.     Rectum: Normal. Guaiac result negative. No mass, tenderness, anal fissure, external hemorrhoid or internal hemorrhoid. Normal anal tone.  Musculoskeletal:        General: Normal range of motion.     Cervical back: Neck supple.     Right lower leg: No edema.  Lymphadenopathy:     Cervical: No cervical adenopathy.     Lower Body: No right inguinal adenopathy. No left inguinal adenopathy.  Skin:    General: Skin is warm and dry.     Coloration: Skin is not pale.  Neurological:     General: No focal deficit present.     Mental Status: He is alert.  Psychiatric:        Mood and Affect: Mood normal.        Behavior: Behavior normal.     Lab Results  Component Value Date   WBC 4.1 10/13/2019   HGB 11.7 (L) 10/13/2019   HCT 35.8 (L) 10/13/2019   PLT 159 10/13/2019   GLUCOSE 88 10/13/2019   CHOL 181 07/14/2019   TRIG 99.0 07/14/2019   HDL 64.50 07/14/2019   LDLCALC 97 07/14/2019   ALT 7 (L) 10/13/2019   AST 15 10/13/2019   NA 136 10/13/2019   K 4.3 10/13/2019   CL 101 10/13/2019   CREATININE 1.28 (H) 10/13/2019   BUN 15 10/13/2019   CO2 30 10/13/2019   TSH 0.96 10/13/2019    PSA 3.27 11/10/2017   HGBA1C 5.8 01/10/2019    DG Epidural/Nerve Root  Result Date: 05/26/2019 CLINICAL DATA:  Lumbosacral spondylosis without myelopathy. Chronic low back and left greater than right leg pain. Good response to previous left L4-5 transforaminal epidural injections. EXAM: EPIDURAL/NERVE ROOT FLUOROSCOPY TIME:  Fluoroscopy Time: 21 seconds Radiation Exposure Index: 9.48 microGray*m^2 PROCEDURE: The procedure, risks, benefits, and alternatives were explained to  the patient. Questions regarding the procedure were encouraged and answered. The patient understands and consents to the procedure. LEFT L4 NERVE ROOT BLOCK AND TRANSFORAMINAL EPIDURAL: A posterior oblique approach was taken to the intervertebral foramen on the left at L4-5 using a curved 3.5 inch 22 gauge spinal needle. Injection of Isovue-M 200 outlined the left L4 nerve root and showed good epidural spread. No vascular opacification is seen. 120 mg of Depo-Medrol mixed with 2 mL of 1% lidocaine were instilled. The procedure was well-tolerated, and the patient was discharged thirty minutes following the injection in good condition. COMPLICATIONS: None IMPRESSION: Technically successful injection consisting of a left L4 nerve root block and transforaminal epidural. Electronically Signed   By: Logan Bores M.D.   On: 05/26/2019 11:37    Assessment & Plan:   Burris was seen today for urinary tract infection and constipation.  Diagnoses and all orders for this visit:  Pyuria- He is asymptomatic and his UA is normal.  The culture is positive for staph epidermidis.  This is either a contaminant or an insignificant finding.  I think he has asymptomatic bacteriuria that does not need to be treated with an antibiotic. -     Urinalysis, Routine w reflex microscopic; Future -     CULTURE, URINE COMPREHENSIVE; Future -     CULTURE, URINE COMPREHENSIVE -     Urinalysis, Routine w reflex microscopic  Chronic idiopathic constipation -      linaclotide (LINZESS) 145 MCG CAPS capsule; Take 1 capsule (145 mcg total) by mouth daily before breakfast.  Flu vaccine need -     Flu Vaccine QUAD High Dose(Fluad)  Other orders -     Pneumococcal polysaccharide vaccine 23-valent greater than or equal to 2yo subcutaneous/IM   I have discontinued Jeneen Rinks Epps's magnesium hydroxide. I am also having him maintain his aspirin, multivitamin, latanoprost, cycloSPORINE, brimonidine, acetaminophen, tamsulosin, lisinopril, ferrous sulfate, levETIRAcetam, and linaclotide.  Meds ordered this encounter  Medications  . linaclotide (LINZESS) 145 MCG CAPS capsule    Sig: Take 1 capsule (145 mcg total) by mouth daily before breakfast.    Dispense:  90 capsule    Refill:  1     Follow-up: Return in about 3 months (around 02/14/2020).  Scarlette Calico, MD

## 2019-11-15 NOTE — Patient Instructions (Signed)

## 2019-11-18 LAB — URINALYSIS, ROUTINE W REFLEX MICROSCOPIC
Bacteria, UA: NONE SEEN /HPF
Bilirubin Urine: NEGATIVE
Glucose, UA: NEGATIVE
Hgb urine dipstick: NEGATIVE
Hyaline Cast: NONE SEEN /LPF
Ketones, ur: NEGATIVE
Leukocytes,Ua: NEGATIVE
Nitrite: NEGATIVE
Protein, ur: NEGATIVE
RBC / HPF: NONE SEEN /HPF (ref 0–2)
Specific Gravity, Urine: 1.011 (ref 1.001–1.03)
Squamous Epithelial / HPF: NONE SEEN /HPF
WBC, UA: NONE SEEN /HPF (ref 0–5)
pH: 6 (ref 5.0–8.0)

## 2019-11-18 LAB — CULTURE, URINE COMPREHENSIVE

## 2019-11-22 ENCOUNTER — Other Ambulatory Visit: Payer: Self-pay | Admitting: Internal Medicine

## 2019-11-22 ENCOUNTER — Telehealth: Payer: Self-pay | Admitting: Internal Medicine

## 2019-11-22 DIAGNOSIS — K5904 Chronic idiopathic constipation: Secondary | ICD-10-CM

## 2019-11-22 NOTE — Telephone Encounter (Signed)
    Patient is requesting referral to GI; constipation Last seen by Dr Ronnald Ramp 9/28

## 2019-11-28 ENCOUNTER — Encounter: Payer: Self-pay | Admitting: Neurology

## 2019-11-28 ENCOUNTER — Ambulatory Visit: Payer: Medicare Other | Admitting: Neurology

## 2019-11-28 ENCOUNTER — Other Ambulatory Visit: Payer: Self-pay

## 2019-11-28 VITALS — BP 150/68 | Ht 67.0 in | Wt 146.0 lb

## 2019-11-28 DIAGNOSIS — R569 Unspecified convulsions: Secondary | ICD-10-CM | POA: Diagnosis not present

## 2019-11-28 MED ORDER — LEVETIRACETAM 500 MG PO TABS: 500.0000 mg | ORAL_TABLET | Freq: Two times a day (BID) | ORAL | 4 refills | Status: DC

## 2019-11-28 NOTE — Progress Notes (Signed)
PATIENT: Evan Escobar DOB: [birth year]  REASON FOR VISIT: follow up HISTORY FROM: patient  HISTORY OF PRESENT ILLNESS: Today 11/28/19  HISTORY UPDATE5/29/2019CMMr.Escobar,.79 year old male returns for follow-up with history of epilepsy.He is currently on Keppra 500mg twice daily.He denies side effects. Last seizure occurred September 2014. He had cervical discectomy May 22, 2017 by Dr. Saintclair Halsted. He is continuing to recuperate. He is back to driving without difficulty. He returns for reevaluation neck  UPDATE 05/29/2018CM Evan Escobar, 79 year old male returns for follow-up history of epilepsy last seizure occurring September 2014. He is currently on Keppra 500 twice daily tolerating the medication without side effects. He remains fairly active. Appetite is good he is sleeping well. MRI and EEG in the past been normal. He is driving without difficulty. He returns for reevaluation  06/12/15 Evan Escobar is a 79 years old right-handed male, following up for epilepsy,  He was previously evaluated by Dr. Janann Colonel who has left practice, and followed up by [facility] practitioner, he presented with one seizure in September 2014, witnessed by his wife, morning sound, body tonic-clonic shaking, he woke up on the ambulance was brought to the emergency room,  Per patient, he was drinking daily use cocaine frequently during that period of time, about 1 year prior to his seizure, he had frequent spells of feeling going to pass out, could not respond to surroundings.  I have personally reviewed MRI of brain with without contrast in September 2014 that was normal, EEG was normal in September 2014  He was started on Keppra 500 mg twice a day, he has been compliant with his medications, he is no longer drinking or using illicit drugs, there was no recurrent spells, there is no generalized seizure.  He is driving, per ED record, he suffered a T-bone injury in February 2017, there  was no seizure activity described, I personally reviewed repeat CAT scan of the brain that was normal.  Update July 20, 6063 SS: 79 year old male with history of epilepsy, taking Keppra 500 mg twice daily.  His last seizure occurred on September 2014. He is tolerating the Keppra without side effect.  He has not had any recurrent seizure.  He denies any new problems or concerns.  He is driving a car without difficulty.  Update November 28, 2019 SS: No recurrent seizure, remains on Keppra 500 mg twice a day, last seizure occurred in September 2014, this has been the only seizure, tolerating Keppra, considers discontinuing the medication.  He is driving a car.  Overall health is good, does have constipation.  Apparently, only seizure occurred during the time when he was discontinuing cocaine, alcohol.  Here today unaccompanied.   REVIEW OF SYSTEMS: Out of a complete 14 system review of symptoms, the patient complains only of the following symptoms, and all other reviewed systems are negative.  Seizure  ALLERGIES: No Known Allergies  HOME MEDICATIONS: Outpatient Medications Prior to Visit  Medication Sig Dispense Refill  . acetaminophen (TYLENOL) 500 MG tablet Take 500 mg by mouth every 6 (six) hours as needed for moderate pain or headache.     Marland Kitchen aspirin 81 MG tablet Take 81 mg by mouth daily.    . brimonidine (ALPHAGAN P) 0.1 % SOLN Place 1 drop 3 (three) times daily into both eyes.    . cycloSPORINE (RESTASIS) 0.05 % ophthalmic emulsion Place 1 drop into both eyes 2 (two) times daily.    . ferrous sulfate 325 (65 FE) MG tablet Take 1 tablet (325 mg total) by mouth  2 (two) times daily with a meal. 180 tablet 0  . latanoprost (XALATAN) 0.005 % ophthalmic solution Place 1 drop into both eyes at bedtime.     . levETIRAcetam (KEPPRA) 500 MG tablet Take 1 tablet (500 mg total) by mouth 2 (two) times daily. 180 tablet 0  . linaclotide (LINZESS) 145 MCG CAPS capsule Take 1 capsule (145 mcg total) by  mouth daily before breakfast. 90 capsule 1  . lisinopril (ZESTRIL) 10 MG tablet TAKE 1 TABLET(10 MG) BY MOUTH DAILY 90 tablet 1  . Multiple Vitamin (MULTIVITAMIN) tablet Take 1 tablet by mouth daily.    . tamsulosin (FLOMAX) 0.4 MG CAPS capsule Take 1 capsule (0.4 mg total) by mouth daily. 90 capsule 3   No facility-administered medications prior to visit.    PAST MEDICAL HISTORY: Past Medical History:  Diagnosis Date  . Allergy   . Arthritis   . Cataract   . Glaucoma   . Hypertension   . Normocytic anemia 09/25/2012  . Polyp, sigmoid colon 11/05/2011  . Seizure ([place])   . Seizures ([place]) 03/17/2013    PAST SURGICAL HISTORY: Past Surgical History:  Procedure Laterality Date  . ANTERIOR CERVICAL DECOMP/DISCECTOMY FUSION N/A 05/22/2017   Procedure: Anterior Cervical Discectomy Fusion - Cervical Three-Cervical Four - Cervical Four-Cervical Five;  Surgeon: Kary Kos, MD;  Location: [facility];  Service: Neurosurgery;  Laterality: N/A;  . CATARACT EXTRACTION, BILATERAL    . EYE SURGERY    . GLAUCOMA REPAIR  10 yrs ago    FAMILY HISTORY: Family History  Problem Relation Age of Onset  . Stroke Mother   . High blood pressure Mother   . Stroke Father   . High blood pressure Father   . Colon cancer Brother   . Esophageal cancer Neg Hx   . Pancreatic cancer Neg Hx   . Rectal cancer Neg Hx   . Stomach cancer Neg Hx     SOCIAL HISTORY: Social History   Socioeconomic History  . Marital status: Married    Spouse name: Terrence Dupont  . Number of children: 1  . Years of education: 81  . Highest education level: Not on file  Occupational History  . Occupation: retired  Tobacco Use  . Smoking status: Former Smoker    Packs/day: 1.00    Types: Cigarettes    Quit date: 02/18/2007    Years since quitting: 12.7  . Smokeless tobacco: Never Used  . Tobacco comment: not sure but a while ago; quit smoking and ETOH  Vaping Use  . Vaping Use: Never used  Substance and Sexual Activity  . Alcohol use:  No    Alcohol/week: 0.0 standard drinks    Comment: quit in 2009  . Drug use: No  . Sexual activity: Not on file  Other Topics Concern  . Not on file  Social History Narrative   Patient lives at home with his wife Terrence Dupont). Patient is retired. Patient has 11 th grade education.    Caffeine- one cup of coffee daily and one soda.   Right handed.   Social Determinants of Health   Financial Resource Strain:   . Difficulty of Paying Living Expenses: Not on file  Food Insecurity:   . Worried About Charity fundraiser in the Last Year: Not on file  . Ran Out of Food in the Last Year: Not on file  Transportation Needs:   . Lack of Transportation (Medical): Not on file  . Lack of Transportation (Non-Medical): Not on file  Physical Activity:   . Days of Exercise per Week: Not on file  . Minutes of Exercise per Session: Not on file  Stress:   . Feeling of Stress : Not on file  Social Connections:   . Frequency of Communication with Friends and Family: Not on file  . Frequency of Social Gatherings with Friends and Family: Not on file  . Attends Religious Services: Not on file  . Active Member of Clubs or Organizations: Not on file  . Attends Archivist Meetings: Not on file  . Marital Status: Not on file  Intimate Partner Violence:   . Fear of Current or Ex-Partner: Not on file  . Emotionally Abused: Not on file  . Physically Abused: Not on file  . Sexually Abused: Not on file      PHYSICAL EXAM  There were no vitals filed for this visit. There is no height or weight on file to calculate BMI.  Generalized: Well developed, in no acute distress   Neurological examination  Mentation: Alert oriented to time, place, history taking. Follows all commands speech and language fluent Cranial nerve II-XII: Pupils were equal round reactive to light. Extraocular movements were full, visual field were full on confrontational test. Facial sensation and strength were normal. Head  turning and shoulder shrug  were normal and symmetric. Motor: The motor testing reveals 5 over 5 strength of all 4 extremities. Good symmetric motor tone is noted throughout.  Sensory: Sensory testing is intact to soft touch on all 4 extremities. No evidence of extinction is noted.  Coordination: Cerebellar testing reveals good finger-nose-finger and heel-to-shin bilaterally.  Gait and station: Gait is normal.  Reflexes: Deep tendon reflexes are symmetric and normal bilaterally.   DIAGNOSTIC DATA (LABS, IMAGING, TESTING) - I reviewed patient records, labs, notes, testing and imaging myself where available.  Lab Results  Component Value Date   WBC 4.1 10/13/2019   HGB 11.7 (L) 10/13/2019   HCT 35.8 (L) 10/13/2019   MCV 87.3 10/13/2019   PLT 159 10/13/2019      Component Value Date/Time   NA 136 10/13/2019 1532   K 4.3 10/13/2019 1532   CL 101 10/13/2019 1532   CO2 30 10/13/2019 1532   GLUCOSE 88 10/13/2019 1532   BUN 15 10/13/2019 1532   CREATININE 1.28 (H) 10/13/2019 1532   CALCIUM 9.6 10/13/2019 1532   PROT 7.1 10/13/2019 1532   ALBUMIN 4.1 07/14/2019 0848   AST 15 10/13/2019 1532   ALT 7 (L) 10/13/2019 1532   ALKPHOS 27 (L) 07/14/2019 0848   BILITOT 0.4 10/13/2019 1532   GFRNONAA 53 (L) 10/13/2019 1532   GFRAA 62 10/13/2019 1532   Lab Results  Component Value Date   CHOL 181 07/14/2019   HDL 64.50 07/14/2019   LDLCALC 97 07/14/2019   TRIG 99.0 07/14/2019   CHOLHDL 3 07/14/2019   Lab Results  Component Value Date   HGBA1C 5.8 01/10/2019   Lab Results  Component Value Date   [id number] 425 10/13/2019   Lab Results  Component Value Date   TSH 0.96 10/13/2019      ASSESSMENT AND PLAN 79 y.o. year old male  has a past medical history of Allergy, Arthritis, Cataract, Glaucoma, Hypertension, Normocytic anemia (09/25/2012), Polyp, sigmoid colon (11/05/2011), Seizure ([place]), and Seizures ([place]) (03/17/2013). here with:  1.  Seizure disorder -Last seizure in 2014,  can discuss in future long-term need for seizure medication -For now, continue Keppra 500 mg twice a day -Call for recurrent  seizure -Follow-up 1 year or sooner if needed  I spent 20 minutes of face-to-face and non-face-to-face time with patient.  This included previsit chart review, lab review, study review, order entry, electronic health record documentation, patient education.  Butler Denmark, AGNP-C, DNP 11/28/2019, 5:45 AM [facility] [address] [place], [place] [address] [phone]

## 2019-11-28 NOTE — Patient Instructions (Signed)
Continue Keppra at current dosing Call for seizure activity  Follow-up in 1 year

## 2019-11-29 ENCOUNTER — Other Ambulatory Visit: Payer: Self-pay | Admitting: Internal Medicine

## 2019-12-21 ENCOUNTER — Other Ambulatory Visit: Payer: Self-pay | Admitting: Internal Medicine

## 2019-12-21 DIAGNOSIS — I10 Essential (primary) hypertension: Secondary | ICD-10-CM

## 2019-12-27 ENCOUNTER — Encounter: Payer: Self-pay | Admitting: Nurse Practitioner

## 2019-12-27 ENCOUNTER — Ambulatory Visit: Payer: Medicare Other | Admitting: Nurse Practitioner

## 2019-12-27 VITALS — BP 126/62 | HR 63 | Ht 67.0 in | Wt 145.8 lb

## 2019-12-27 DIAGNOSIS — K5904 Chronic idiopathic constipation: Secondary | ICD-10-CM

## 2019-12-27 NOTE — Progress Notes (Signed)
ASSESSMENT AND PLAN     # 79 yo male with chronic constipation. He recently increased water intake and feels that helped but continues to take MOM twice a week.  --Needs minimum of 48 oz water daily.  --Patient says he has never tried fiber supplements. Will add Benefiber BID. Hopefully this will allow for less dependence on MOM, especially since he had mild renal insufficiency on labs in August.  --Normal TSH in August 2021 --Follow up in a couple of months. Depending on clinical course, may try Linzess again. No BMs on Linzess but appears he didn't take for any length of time before deciding it didn't work.     # Chronic normocytic anemia. Hgb stable at 11.7.  No evidence for iron deficiency  # History of adenomatous colon polyps.  No polyps nor cancer on last colonoscopy December 2018  HISTORY OF PRESENT ILLNESS     Primary Gastroenterologist : Zenovia Jarred, MD Chief Complaint : Constipation   Evan Escobar is a 79 y.o. male with PMH / [place] significant for,  but not necessarily limited to: Adenomatous colon polyps, chronic constipation , epilepsy, prostatitis, hypertension, glaucoma   Patient wants to know why he continues to get constipated. Patient feels like until recently he hasn't consumed enough water on a daily basis. He has recently started drinking a lot more water lately and takes MOM about twice a week.   Patient says he has never tried a fiber supplement. He took [place] for several days but says it didn't work. He tried Miralax a long time again but says it didn't work either. No other GI complaints. Weight is stable.    Data Reviewed: August 2021 Creatinine 1.28 Lipase normal, LFTs normal Hemoglobin 11.7, MCV 87 Normal TSH   Previous Endoscopic Evaluations / Pertinent Studies:  December 2018 colonoscopy for polyp surveillance --complete exam with good prep December 2018  Past Medical History:  Diagnosis Date  . Allergy   . Arthritis   . Cataract   .  Glaucoma   . Hypertension   . Normocytic anemia 09/25/2012  . Polyp, sigmoid colon 11/05/2011  . Seizure ([place])   . Seizures ([place]) 03/17/2013     Past Surgical History:  Procedure Laterality Date  . ANTERIOR CERVICAL DECOMP/DISCECTOMY FUSION N/A 05/22/2017   Procedure: Anterior Cervical Discectomy Fusion - Cervical Three-Cervical Four - Cervical Four-Cervical Five;  Surgeon: Kary Kos, MD;  Location: [facility];  Service: Neurosurgery;  Laterality: N/A;  . CATARACT EXTRACTION, BILATERAL    . EYE SURGERY    . GLAUCOMA REPAIR  10 yrs ago   Family History  Problem Relation Age of Onset  . Stroke Mother   . High blood pressure Mother   . Stroke Father   . High blood pressure Father   . Colon cancer Brother   . Esophageal cancer Neg Hx   . Pancreatic cancer Neg Hx   . Rectal cancer Neg Hx   . Stomach cancer Neg Hx    Social History   Tobacco Use  . Smoking status: Former Smoker    Packs/day: 1.00    Types: Cigarettes    Quit date: 02/18/2007    Years since quitting: 12.8  . Smokeless tobacco: Never Used  . Tobacco comment: not sure but a while ago; quit smoking and ETOH  Vaping Use  . Vaping Use: Never used  Substance Use Topics  . Alcohol use: No    Alcohol/week: 0.0 standard drinks    Comment: quit  in 2009  . Drug use: No   Current Outpatient Medications  Medication Sig Dispense Refill  . acetaminophen (TYLENOL) 500 MG tablet Take 500 mg by mouth every 6 (six) hours as needed for moderate pain or headache.     Marland Kitchen aspirin 81 MG tablet Take 81 mg by mouth daily.    . brimonidine (ALPHAGAN P) 0.1 % SOLN Place 1 drop 3 (three) times daily into both eyes.    . cycloSPORINE (RESTASIS) 0.05 % ophthalmic emulsion Place 1 drop into both eyes 2 (two) times daily.    Marland Kitchen latanoprost (XALATAN) 0.005 % ophthalmic solution Place 1 drop into both eyes at bedtime.     . levETIRAcetam (KEPPRA) 500 MG tablet Take 1 tablet (500 mg total) by mouth 2 (two) times daily. 180 tablet 4  . lisinopril  (ZESTRIL) 10 MG tablet TAKE 1 TABLET(10 MG) BY MOUTH DAILY (Patient taking differently: Take 10 mg by mouth daily. ) 90 tablet 0  . magnesium hydroxide (MILK OF MAGNESIA) 400 MG/5ML suspension Take 30 mLs by mouth as needed for mild constipation.    . Multiple Vitamin (MULTIVITAMIN) tablet Take 1 tablet by mouth daily.    . tamsulosin (FLOMAX) 0.4 MG CAPS capsule TAKE 1 CAPSULE(0.4 MG) BY MOUTH DAILY (Patient taking differently: Take 0.4 mg by mouth daily. ) 90 capsule 0   No current facility-administered medications for this visit.   No Known Allergies   Review of Systems:  All systems reviewed and negative except where noted in HPI.   PHYSICAL EXAM :    Wt Readings from Last 3 Encounters:  12/27/19 145 lb 12.8 oz (66.1 kg)  11/28/19 146 lb (66.2 kg)  11/15/19 144 lb (65.3 kg)    BP 126/62   Pulse 63   Ht 5\' 7"  (1.702 m)   Wt 145 lb 12.8 oz (66.1 kg)   SpO2 99%   BMI 22.84 kg/m  Constitutional:  Pleasant male in no acute distress. Psychiatric: Normal mood and affect. Behavior is normal. EENT: Pupils normal.  Conjunctivae are normal. No scleral icterus. Neck supple.  Cardiovascular: Normal rate, regular rhythm. No edema Pulmonary/chest: Effort normal and breath sounds normal. No wheezing, rales or rhonchi. Abdominal: Soft, nondistended, nontender. Bowel sounds active throughout. There are no masses palpable.  Neurological: Alert and oriented to person place and time. Skin: Skin is warm and dry. No rashes noted.  I spent 30 minutes total reviewing records, obtaining history, performing exam, counseling patient and documenting visit / findings.     Evan Savoy, NP  12/27/2019, 11:09 AM

## 2019-12-27 NOTE — Patient Instructions (Signed)
If you are age 78 or older, your body mass index should be between 23-30. Your Body mass index is 22.84 kg/m. If this is out of the aforementioned range listed, please consider follow up with your Primary Care Provider.  If you are age 83 or younger, your body mass index should be between 19-25. Your Body mass index is 22.84 kg/m. If this is out of the aformentioned range listed, please consider follow up with your Primary Care Provider.   Start Benefiber 2 teaspoons in 8 ounces of liquid twice daily.   Increase water intake to 48 ounces a day.   Follow up in 2 months.

## 2020-01-02 NOTE — Progress Notes (Signed)
Addendum: Reviewed and agree with assessment and management plan. Colen Eltzroth M, MD  

## 2020-01-05 DIAGNOSIS — H401133 Primary open-angle glaucoma, bilateral, severe stage: Secondary | ICD-10-CM | POA: Diagnosis not present

## 2020-01-16 ENCOUNTER — Other Ambulatory Visit: Payer: Self-pay | Admitting: Neurosurgery

## 2020-01-17 ENCOUNTER — Other Ambulatory Visit: Payer: Self-pay | Admitting: Neurosurgery

## 2020-01-17 DIAGNOSIS — M4316 Spondylolisthesis, lumbar region: Secondary | ICD-10-CM

## 2020-01-20 NOTE — Discharge Instructions (Signed)

## 2020-01-23 ENCOUNTER — Other Ambulatory Visit: Payer: Self-pay

## 2020-01-23 ENCOUNTER — Ambulatory Visit
Admission: RE | Admit: 2020-01-23 | Discharge: 2020-01-23 | Disposition: A | Discharge status: Home / Self Care | Payer: Medicare Other | Source: Ambulatory Visit | Attending: Neurosurgery | Admitting: Neurosurgery

## 2020-01-23 DIAGNOSIS — M4316 Spondylolisthesis, lumbar region: Secondary | ICD-10-CM

## 2020-01-23 DIAGNOSIS — M47817 Spondylosis without myelopathy or radiculopathy, lumbosacral region: Secondary | ICD-10-CM | POA: Diagnosis not present

## 2020-01-23 MED ORDER — METHYLPREDNISOLONE ACETATE 40 MG/ML INJ SUSP (RADIOLOG
120.0000 mg | Freq: Once | INTRAMUSCULAR | Status: AC
  Administered 2020-01-23: 120 mg via EPIDURAL

## 2020-01-23 MED ORDER — IOPAMIDOL (ISOVUE-M 200) INJECTION 41%
1.0000 mL | Freq: Once | INTRAMUSCULAR | Status: AC
  Administered 2020-01-23: 1 mL via EPIDURAL

## 2020-01-27 ENCOUNTER — Other Ambulatory Visit: Payer: Self-pay | Admitting: Neurosurgery

## 2020-01-28 ENCOUNTER — Ambulatory Visit (HOSPITAL_COMMUNITY)
Admission: EM | Admit: 2020-01-28 | Discharge: 2020-01-28 | Disposition: A | Discharge status: Home / Self Care | Payer: Medicare Other | Attending: Family Medicine | Admitting: Family Medicine

## 2020-01-28 ENCOUNTER — Other Ambulatory Visit: Payer: Self-pay

## 2020-01-28 ENCOUNTER — Encounter (HOSPITAL_COMMUNITY): Payer: Self-pay

## 2020-01-28 DIAGNOSIS — M5431 Sciatica, right side: Secondary | ICD-10-CM | POA: Diagnosis not present

## 2020-01-28 DIAGNOSIS — M79604 Pain in right leg: Secondary | ICD-10-CM | POA: Diagnosis not present

## 2020-01-28 DIAGNOSIS — M79605 Pain in left leg: Secondary | ICD-10-CM | POA: Diagnosis not present

## 2020-01-28 DIAGNOSIS — M5432 Sciatica, left side: Secondary | ICD-10-CM | POA: Diagnosis not present

## 2020-01-28 LAB — POCT URINALYSIS DIPSTICK, ED / UC
Bilirubin Urine: NEGATIVE
Glucose, UA: NEGATIVE mg/dL
Hgb urine dipstick: NEGATIVE
Ketones, ur: NEGATIVE mg/dL
Leukocytes,Ua: NEGATIVE
Nitrite: NEGATIVE
Protein, ur: NEGATIVE mg/dL
Specific Gravity, Urine: 1.02 (ref 1.005–1.030)
Urobilinogen, UA: 0.2 mg/dL (ref 0.0–1.0)
pH: 7.5 (ref 5.0–8.0)

## 2020-01-28 LAB — CBG MONITORING, ED: Glucose-Capillary: 85 mg/dL (ref 70–99)

## 2020-01-28 MED ORDER — TIZANIDINE HCL 2 MG PO TABS: 4.0000 mg | ORAL_TABLET | Freq: Three times a day (TID) | ORAL | 0 refills | Status: DC | PRN

## 2020-01-28 NOTE — ED Provider Notes (Signed)
[facility]    CSN: [id number] Arrival date & time: 01/28/20  1003      History   Chief Complaint Chief Complaint  Patient presents with  . Leg Pain    Pt have sharp pain in both legs    HPI Evan Escobar is a 79 y.o. male.   HPI  Patient with a history of chronic lumbar disc disease who is followed Dr. Saintclair Halsted presents today with bilateral lower leg pain.  He reports he received an injection on Monday that he has gotten in the past however over the last couple days he has had persistent leg pain and leg cramping.  He contacted Dr. Windy Carina office however no one ever responded as he called late in the evening on yesterday and their office had closed.  He would like a muscle relaxer as he is having some significant cramping and pain.  Past Medical History:  Diagnosis Date  . Allergy   . Arthritis   . Cataract   . Glaucoma   . Hypertension   . Normocytic anemia 09/25/2012  . Polyp, sigmoid colon 11/05/2011  . Seizure ([place])   . Seizures ([place]) 03/17/2013    Patient Active Problem List   Diagnosis Date Noted  . Pyuria 11/15/2019  . Acute prostatitis 10/17/2019  . Iron deficiency anemia secondary to inadequate dietary iron intake 10/14/2019  . Deficiency anemia 10/13/2019  . Chronic idiopathic constipation 10/13/2019  . Cervical radiculopathy 01/10/2019  . Eye problem 01/10/2019  . Left lumbar radiculopathy 12/09/2017  . Hearing loss 09/09/2016  . Benign prostatic hyperplasia 08/28/2015  . Routine general medical examination at a health care facility 08/28/2015  . Allergic rhinitis 02/26/2015  . Glaucoma 05/07/2013  . Essential hypertension, benign 05/07/2013  . Seizures ([place]) 03/17/2013  . Normocytic anemia 09/25/2012    Past Surgical History:  Procedure Laterality Date  . ANTERIOR CERVICAL DECOMP/DISCECTOMY FUSION N/A 05/22/2017   Procedure: Anterior Cervical Discectomy Fusion - Cervical Three-Cervical Four - Cervical Four-Cervical Five;  Surgeon: Kary Kos,  MD;  Location: [facility];  Service: Neurosurgery;  Laterality: N/A;  . CATARACT EXTRACTION, BILATERAL    . EYE SURGERY    . GLAUCOMA REPAIR  10 yrs ago       Home Medications    Prior to Admission medications   Medication Sig Start Date End Date Taking? Authorizing Provider  acetaminophen (TYLENOL) 500 MG tablet Take 500 mg by mouth every 6 (six) hours as needed for moderate pain or headache.     [provider]  aspirin 81 MG tablet Take 81 mg by mouth daily.    [provider]  brimonidine (ALPHAGAN P) 0.1 % SOLN Place 1 drop 3 (three) times daily into both eyes.    [provider]  cycloSPORINE (RESTASIS) 0.05 % ophthalmic emulsion Place 1 drop into both eyes 2 (two) times daily.    [provider]  latanoprost (XALATAN) 0.005 % ophthalmic solution Place 1 drop into both eyes at bedtime.  09/20/12   [provider]  levETIRAcetam (KEPPRA) 500 MG tablet Take 1 tablet (500 mg total) by mouth 2 (two) times daily. 11/28/19   Suzzanne Cloud, NP  lisinopril (ZESTRIL) 10 MG tablet TAKE 1 TABLET(10 MG) BY MOUTH DAILY Patient taking differently: Take 10 mg by mouth daily.  12/21/19   [name], MD  magnesium hydroxide (MILK OF MAGNESIA) 400 MG/5ML suspension Take 30 mLs by mouth as needed for mild constipation.    [provider]  Multiple Vitamin (MULTIVITAMIN) tablet Take 1 tablet by mouth daily.    [provider]  tamsulosin (FLOMAX) 0.4 MG CAPS capsule TAKE 1 CAPSULE(0.4 MG) BY MOUTH DAILY Patient taking differently: Take 0.4 mg by mouth daily.  11/29/19   [name], MD    Family History Family History  Problem Relation Age of Onset  . Stroke Mother   . High blood pressure Mother   . Stroke Father   . High blood pressure Father   . Colon cancer Brother   . Esophageal cancer Neg Hx   . Pancreatic cancer Neg Hx   . Rectal cancer Neg Hx   . Stomach cancer Neg Hx     Social History Social History    Tobacco Use  . Smoking status: Former Smoker    Packs/day: 1.00    Types: Cigarettes    Quit date: 02/18/2007    Years since quitting: 12.9  . Smokeless tobacco: Never Used  . Tobacco comment: not sure but a while ago; quit smoking and ETOH  Vaping Use  . Vaping Use: Never used  Substance Use Topics  . Alcohol use: No    Alcohol/week: 0.0 standard drinks    Comment: quit in 2009  . Drug use: No     Allergies   Patient has no known allergies.   Review of Systems Review of Systems Pertinent negatives listed in HPI   Physical Exam Triage Vital Signs ED Triage Vitals  Enc Vitals Group     BP 01/28/20 1012 (!) 163/68     Pulse Rate 01/28/20 1012 68     Resp 01/28/20 1012 18     Temp 01/28/20 1012 98.9 F (37.2 C)     Temp src --      SpO2 01/28/20 1012 100 %     Weight --      Height --      Head Circumference --      Peak Flow --      Pain Score 01/28/20 1013 8     Pain Loc --      Pain Edu? --      Excl. in [place]? --    No data found.  Updated Vital Signs BP (!) 163/68 (BP Location: Right Arm)   Pulse 68   Temp 98.9 F (37.2 C)   Resp 18   SpO2 100%   Visual Acuity Right Eye Distance:   Left Eye Distance:   Bilateral Distance:    Right Eye Near:   Left Eye Near:    Bilateral Near:     Physical Exam HENT:     Head: Normocephalic.  Cardiovascular:     Rate and Rhythm: Normal rate.  Pulmonary:     Effort: Pulmonary effort is normal.     Breath sounds: Normal breath sounds.  Musculoskeletal:        General: Normal range of motion.     Comments: Kyphosis of spine present   Skin:    General: Skin is warm and dry.     Capillary Refill: Capillary refill takes less than 2 seconds.  Neurological:     General: No focal deficit present.     Mental Status: He is alert.     Motor: Weakness present.     Gait: Gait abnormal.     Comments: Mobility aid used for ambulation  Psychiatric:        Mood and Affect: Mood normal.        Behavior: Behavior  normal.      UC Treatments / Results  Labs (all labs ordered are listed, but only abnormal results are displayed) Labs Reviewed - No data to display  EKG   Radiology No results found.  Procedures Procedures (including critical care time)  Medications Ordered in UC Medications - No data to display  Initial Impression / Assessment and Plan / UC Course  I have reviewed the triage vital signs and the nursing notes.  Pertinent labs & imaging results that were available during my care of the patient were reviewed by me and considered in my medical decision making (see chart for details).    Bilateral sciatica, trial Tizanidine for pain. Follow-up with neurosurgeon on Monday. Patient verbalized understanding and agreement with plan.  Final Clinical Impressions(s) / UC Diagnoses   Final diagnoses:  Bilateral sciatica     Discharge Instructions     Contact Dr. Windy Carina office on Monday to follow-up for further work-up and evaluation of source of leg pain and leg cramps if this medication does not help with symptoms.    ED Prescriptions    Medication Sig Dispense Auth. Provider   tiZANidine (ZANAFLEX) 2 MG tablet Take 2 tablets (4 mg total) by mouth every 8 (eight) hours as needed for muscle spasms. 16 tablet Scot Jun, FNP     PDMP not reviewed this encounter.   Scot Jun, FNP 02/08/20 2124

## 2020-01-28 NOTE — Discharge Instructions (Addendum)
Contact Dr. Windy Carina office on Monday to follow-up for further work-up and evaluation of source of leg pain and leg cramps if this medication does not help with symptoms.

## 2020-01-28 NOTE — ED Triage Notes (Signed)
Pt present cramps in both legs. Symptoms started yesterday. Pt state legs feel tight. Pt has been tried OTC medication with no relief.

## 2020-01-28 NOTE — ED Notes (Signed)
Requested urine sample from pt; states "I just went; I didn't know you'd need a sample".  PO fluids provided.

## 2020-02-06 ENCOUNTER — Encounter: Payer: Self-pay | Admitting: Internal Medicine

## 2020-02-06 ENCOUNTER — Other Ambulatory Visit: Payer: Self-pay

## 2020-02-06 ENCOUNTER — Ambulatory Visit (INDEPENDENT_AMBULATORY_CARE_PROVIDER_SITE_OTHER): Payer: Medicare Other | Admitting: Internal Medicine

## 2020-02-06 VITALS — BP 154/84 | HR 59 | Temp 98.0°F | Ht 67.0 in | Wt 145.2 lb

## 2020-02-06 DIAGNOSIS — I10 Essential (primary) hypertension: Secondary | ICD-10-CM | POA: Diagnosis not present

## 2020-02-06 DIAGNOSIS — M5412 Radiculopathy, cervical region: Secondary | ICD-10-CM | POA: Diagnosis not present

## 2020-02-06 DIAGNOSIS — M5416 Radiculopathy, lumbar region: Secondary | ICD-10-CM

## 2020-02-06 DIAGNOSIS — M79602 Pain in left arm: Secondary | ICD-10-CM

## 2020-02-06 MED ORDER — PREDNISONE 20 MG PO TABS: 20.0000 mg | ORAL_TABLET | Freq: Every day | ORAL | 0 refills | Status: AC

## 2020-02-06 MED ORDER — LISINOPRIL 20 MG PO TABS: 20.0000 mg | ORAL_TABLET | Freq: Every day | ORAL | 3 refills | Status: DC

## 2020-02-06 NOTE — Assessment & Plan Note (Signed)
Doing better overall, still has some tizanidine to use if needed. Advised on safe lifting techniques to avoid injury in the future.

## 2020-02-06 NOTE — Assessment & Plan Note (Signed)
EKG done in office to rule out heart equivalent which was without changes. Rx prednisone to see if this helps.

## 2020-02-06 NOTE — Assessment & Plan Note (Signed)
It is possible his left arm pain is related. EKG done to rule out cardiac etiology.

## 2020-02-06 NOTE — Progress Notes (Signed)
   Subjective:   Patient ID: Evan Escobar, male    DOB: 1940/03/31, 79 y.o.   MRN: [id number]  HPI The patient is a 79 YO man coming in for follow up back pain (given some muscle relaxers from the ER and injection in the back, this is improving and he denies taking the muscle relaxers at this time, has some left at home) and blood pressure (slightly elevated today, denies missing his lisinopril, denies headaches or chest pains) and left arm pain (he did close his finger in the door of a car at work, did get sudden numbness in the arm, had to use the other hand to open the door and it was closed in there for some time, still having some left thumb pain and also up the whole left arm, denies chest pains or tingling or tightness, does have hypertension, non-smoker).   Review of Systems  Constitutional: Negative.   HENT: Negative.   Eyes: Negative.   Respiratory: Negative for cough, chest tightness and shortness of breath.   Cardiovascular: Negative for chest pain, palpitations and leg swelling.  Gastrointestinal: Negative for abdominal distention, abdominal pain, constipation, diarrhea, nausea and vomiting.  Musculoskeletal: Positive for myalgias.  Skin: Negative.   Neurological: Negative.   Psychiatric/Behavioral: Negative.     Objective:  Physical Exam Constitutional:      Appearance: He is well-developed and well-nourished.  HENT:     Head: Normocephalic and atraumatic.  Eyes:     Extraocular Movements: EOM normal.  Cardiovascular:     Rate and Rhythm: Normal rate and regular rhythm.  Pulmonary:     Effort: Pulmonary effort is normal. No respiratory distress.     Breath sounds: Normal breath sounds. No wheezing or rales.  Abdominal:     General: Bowel sounds are normal. There is no distension.     Palpations: Abdomen is soft.     Tenderness: There is no abdominal tenderness. There is no rebound.  Musculoskeletal:        General: Tenderness present. No edema.     Cervical back:  Normal range of motion.     Comments: Left arm tenderness diffuse  Skin:    General: Skin is warm and dry.  Neurological:     Mental Status: He is alert and oriented to person, place, and time.     Coordination: Coordination normal.  Psychiatric:        Mood and Affect: Mood and affect normal.     Vitals:   02/06/20 0813  BP: (!) 154/84  Pulse: (!) 59  Temp: 98 F (36.7 C)  TempSrc: Oral  SpO2: 99%  Weight: 145 lb 3.2 oz (65.9 kg)  Height: 5\' 7"  (1.702 m)   EKG: Rate 53, aixs normal, interval normal, sinus brady, no st or t wave changes, no significant change from 2019  This visit occurred during the SARS-CoV-2 public health emergency.  Safety protocols were in place, including screening questions prior to the visit, additional usage of staff PPE, and extensive cleaning of exam room while observing appropriate contact time as indicated for disinfecting solutions.   Assessment & Plan:

## 2020-02-06 NOTE — Assessment & Plan Note (Signed)
BP above goal on lisinopril 10 mg daily. EKG done in the office without significant change from prior. Will increase lisinopril 20 mg daily and follow up 3 months.

## 2020-02-06 NOTE — Patient Instructions (Signed)
The EKG of the heart looks normal today and the same as before.   We have sent in prednisone to take 1 pill a day for 4 days to help the arm pain.

## 2020-02-23 ENCOUNTER — Encounter: Payer: Self-pay | Admitting: Podiatry

## 2020-02-23 ENCOUNTER — Ambulatory Visit: Payer: Medicare Other | Admitting: Podiatry

## 2020-02-23 ENCOUNTER — Other Ambulatory Visit: Payer: Self-pay

## 2020-02-23 DIAGNOSIS — M79674 Pain in right toe(s): Secondary | ICD-10-CM

## 2020-02-23 DIAGNOSIS — L84 Corns and callosities: Secondary | ICD-10-CM

## 2020-02-23 DIAGNOSIS — M79675 Pain in left toe(s): Secondary | ICD-10-CM

## 2020-02-23 DIAGNOSIS — B351 Tinea unguium: Secondary | ICD-10-CM | POA: Diagnosis not present

## 2020-02-23 NOTE — Progress Notes (Signed)
Subjective:   Patient ID: Evan Escobar, male   DOB: 80 y.o.   MRN: [id number]   HPI Patient presents stating he has had some trouble with his nails and corns calluses which become sore and he has trouble taking care of them after    ROS      Objective:  Physical Exam  Neurovascular status intact with incurvated thickened nailbeds 1-5 both feet and painful keratotic lesion distal medial aspect digit to left     Assessment:  Chronic mycotic nail infection with pain 1-5 both feet with lesion formation distal second left painful     Plan:  H&P reviewed condition debrided nailbeds 1-5 both feet no iatrogenic bleeding and lesion distal second left no iatrogenic bleeding and reappoint routine care

## 2020-03-03 ENCOUNTER — Other Ambulatory Visit: Payer: Self-pay | Admitting: Internal Medicine

## 2020-03-09 ENCOUNTER — Encounter: Payer: Self-pay | Admitting: Internal Medicine

## 2020-03-09 ENCOUNTER — Telehealth (INDEPENDENT_AMBULATORY_CARE_PROVIDER_SITE_OTHER): Payer: Medicare Other | Admitting: Internal Medicine

## 2020-03-09 ENCOUNTER — Telehealth: Payer: Self-pay | Admitting: Internal Medicine

## 2020-03-09 DIAGNOSIS — R059 Cough, unspecified: Secondary | ICD-10-CM

## 2020-03-09 MED ORDER — BENZONATATE 200 MG PO CAPS: 200.0000 mg | ORAL_CAPSULE | Freq: Three times a day (TID) | ORAL | 0 refills | Status: DC | PRN

## 2020-03-09 MED ORDER — PROMETHAZINE-DM 6.25-15 MG/5ML PO SYRP: 5.0000 mL | ORAL_SOLUTION | Freq: Four times a day (QID) | ORAL | 0 refills | Status: DC | PRN

## 2020-03-09 NOTE — Telephone Encounter (Signed)
Sent in alternative

## 2020-03-09 NOTE — Progress Notes (Addendum)
Virtual Visit via Audio Note  I connected with Evan Escobar on 03/09/20 at 12:00 PM EST by an audio-only enabled telemedicine application and verified that I am speaking with the correct person using two identifiers.  The patient and the provider were at separate locations throughout the entire encounter. Patient location: home, Provider location: work   I discussed the limitations of evaluation and management by telemedicine and the availability of in person appointments. The patient expressed understanding and agreed to proceed. The patient and the provider were the only parties present for the visit unless noted in HPI below.  History of Present Illness: The patient is a 80 y.o. man with visit for cough. Started nose drainage and cough. Started this week. some nyquil over the counter but was worried about taking it. Denies SOB or fevers or chills. Overall it is not improving or worsening. Denies sick contacts. Covid-19 times 2 plus booster several weeks ago.   Observations/Objective: A and O times 3, no voice changes or coughing or dyspnea during visit.   Assessment and Plan: See problem oriented charting  Follow Up Instructions: rx tessalon perles for cough  Visit time 11 minutes in non-face to face communication with patient and coordination of care.  I discussed the assessment and treatment plan with the patient. The patient was provided an opportunity to ask questions and all were answered. The patient agreed with the plan and demonstrated an understanding of the instructions.   The patient was advised to call back or seek an in-person evaluation if the symptoms worsen or if the condition fails to improve as anticipated.  [name], MD

## 2020-03-09 NOTE — Telephone Encounter (Signed)
See below

## 2020-03-09 NOTE — Assessment & Plan Note (Signed)
Likely viral. Rx tessalon perles for cough. Encouraged to quarantine as he has not tested for covid-19.

## 2020-03-09 NOTE — Telephone Encounter (Signed)
    Patient calling to report his insurance doesn't cover benzonatate (TESSALON) 200 MG capsule The cost is almost $90  Requesting alternative

## 2020-03-30 DIAGNOSIS — H524 Presbyopia: Secondary | ICD-10-CM | POA: Diagnosis not present

## 2020-04-11 ENCOUNTER — Telehealth: Payer: Self-pay | Admitting: Internal Medicine

## 2020-04-11 NOTE — Progress Notes (Signed)
°  Chronic Care Management   Outreach Note  04/11/2020 Name: Evan Escobar MRN: [id number] DOB: [birth year]  Referred by: [name], MD Reason for referral : No chief complaint on file.   An unsuccessful telephone outreach was attempted today. The patient was referred to the pharmacist for assistance with care management and care coordination.   Follow Up Plan:   Carley Perdue [employer] Scheduler

## 2020-04-12 ENCOUNTER — Telehealth: Payer: Self-pay | Admitting: Internal Medicine

## 2020-04-12 NOTE — Progress Notes (Signed)
°  Chronic Care Management   Note  04/12/2020 Name: [name] MRN: [id number] DOB: 11-02-1940  Evan Escobar is a 80 y.o. year old male who is a primary care patient of [name], MD. I reached out to Alva Garnet by phone today in response to a referral sent by [name] PCP, [name], MD.   [name] was given information about Chronic Care Management services today including:  1. CCM service includes personalized support from designated clinical staff supervised by his physician, including individualized plan of care and coordination with other care providers 2. 24/7 contact phone numbers for assistance for urgent and routine care needs. 3. Service will only be billed when office clinical staff spend 20 minutes or more in a month to coordinate care. 4. Only one practitioner may furnish and bill the service in a calendar month. 5. The patient may stop CCM services at any time (effective at the end of the month) by phone call to the office staff.   Patient agreed to services and verbal consent obtained.   Follow up plan:   Carley Perdue [employer] Scheduler

## 2020-05-03 DIAGNOSIS — H401133 Primary open-angle glaucoma, bilateral, severe stage: Secondary | ICD-10-CM | POA: Diagnosis not present

## 2020-05-03 DIAGNOSIS — H348322 Tributary (branch) retinal vein occlusion, left eye, stable: Secondary | ICD-10-CM | POA: Diagnosis not present

## 2020-05-03 DIAGNOSIS — H16223 Keratoconjunctivitis sicca, not specified as Sjogren's, bilateral: Secondary | ICD-10-CM | POA: Diagnosis not present

## 2020-05-03 DIAGNOSIS — H3562 Retinal hemorrhage, left eye: Secondary | ICD-10-CM | POA: Diagnosis not present

## 2020-05-07 ENCOUNTER — Other Ambulatory Visit: Payer: Self-pay

## 2020-05-07 ENCOUNTER — Encounter (INDEPENDENT_AMBULATORY_CARE_PROVIDER_SITE_OTHER): Payer: Medicare Other | Admitting: Ophthalmology

## 2020-05-07 DIAGNOSIS — H34831 Tributary (branch) retinal vein occlusion, right eye, with macular edema: Secondary | ICD-10-CM | POA: Diagnosis not present

## 2020-05-07 DIAGNOSIS — H35033 Hypertensive retinopathy, bilateral: Secondary | ICD-10-CM

## 2020-05-07 DIAGNOSIS — I10 Essential (primary) hypertension: Secondary | ICD-10-CM | POA: Diagnosis not present

## 2020-05-07 DIAGNOSIS — H43813 Vitreous degeneration, bilateral: Secondary | ICD-10-CM

## 2020-05-07 DIAGNOSIS — H34812 Central retinal vein occlusion, left eye, with macular edema: Secondary | ICD-10-CM | POA: Diagnosis not present

## 2020-05-29 ENCOUNTER — Telehealth: Payer: Self-pay

## 2020-05-29 NOTE — Progress Notes (Signed)
Chronic Care Management Pharmacy Assistant   Name: Evan Escobar  MRN: [id number] DOB: 1940-09-09  Reason for Encounter: Evan [facility] Questions Patient acknowledged appointment: OV 05/30/20 @ 9am  Recent office visits:  02/06/20 Evan Mola Crawford,MD-PCP   Recent consult visits:  02/23/20 Evan Escobar,DPM-Podiatry 12/27/19 Evan Escobar visits:  Medication Reconciliation was completed by comparing discharge summary, patient's EMR and Pharmacy list, and upon discussion with patient.  Admitted to the [facility] on 01/28/20 due to Bilateral Sciatica. Discharge date was 01/28/20. Discharged from [facility].    New?Medications Started at [facility] Discharge:?? -started Tizanidine due to muscle spasms.  Medications that remain the same after [facility] Discharge:??  -All other medications will remain the same.    Medications: Outpatient Encounter Medications as of 05/29/2020  Medication Sig  . acetaminophen (TYLENOL) 500 MG tablet Take 500 mg by mouth every 6 (six) hours as needed for moderate pain or headache.   Marland Kitchen aspirin 81 MG tablet Take 81 mg by mouth daily.  . benzonatate (TESSALON) 200 MG capsule Take 1 capsule (200 mg total) by mouth 3 (three) times daily as needed.  . brimonidine (ALPHAGAN P) 0.1 % SOLN Place 1 drop 3 (three) times daily into both eyes.  . cycloSPORINE (RESTASIS) 0.05 % ophthalmic emulsion Place 1 drop into both eyes 2 (two) times daily.  Marland Kitchen latanoprost (XALATAN) 0.005 % ophthalmic solution Place 1 drop into both eyes at bedtime.   . levETIRAcetam (KEPPRA) 500 MG tablet Take 1 tablet (500 mg total) by mouth 2 (two) times daily.  Marland Kitchen lisinopril (ZESTRIL) 20 MG tablet Take 1 tablet (20 mg total) by mouth daily.  . magnesium hydroxide (MILK OF MAGNESIA) 400 MG/5ML suspension Take 30 mLs by mouth as needed for mild constipation.  . methocarbamol (ROBAXIN) 500 MG tablet Take 500 mg by mouth 4 (four) times daily.  . Multiple Vitamin  (MULTIVITAMIN) tablet Take 1 tablet by mouth daily.  . promethazine-dextromethorphan (PROMETHAZINE-DM) 6.25-15 MG/5ML syrup Take 5 mLs by mouth 4 (four) times daily as needed for cough.  . tamsulosin (FLOMAX) 0.4 MG CAPS capsule TAKE 1 CAPSULE(0.4 MG) BY MOUTH DAILY  . tiZANidine (ZANAFLEX) 2 MG tablet Take 2 tablets (4 mg total) by mouth every 8 (eight) hours as needed for muscle spasms.   No facility-administered encounter medications on file as of 05/29/2020.    Have you seen any other providers since your last visit?  Patient wife states he seen his Eye doctor about 2 or 3 weeks ago.  Any changes in your medications or health?  Patient wife states he was started on a new eye drop.  Any side effects from any medications? Patient wife states no side effects at this time.  Do you have any symptoms or problems not managed by your medications?  Patient wife states not at this time.  Any concerns about your health right now?  Patient wife states he complains about constipation.  Has your provider asked that you check blood pressure, blood sugar, or follow special diet at home?  Patient wife states not at this time.  Do you get any type of exercise on a regular basis?  Patient wife states they walk 1-2 times  a week at the school track.  Can you think of a goal you would like to reach for your health?  Patient wife states not at this time.  Do you have any problems getting your medications?  Patient wife states no problem getting medication..  Is there anything that you  would like to discuss during the appointment?  Patient wife states not at this time.  Please bring medications and supplements to appointment   Star Rating Drugs: Lisinopril - last filled 03/19/20 [place], [place] Pharmacists Assistant [phone]  Time Spent: [id number]

## 2020-05-30 ENCOUNTER — Other Ambulatory Visit: Payer: Self-pay

## 2020-05-30 ENCOUNTER — Ambulatory Visit (INDEPENDENT_AMBULATORY_CARE_PROVIDER_SITE_OTHER): Payer: Medicare Other | Admitting: Internal Medicine

## 2020-05-30 ENCOUNTER — Ambulatory Visit (INDEPENDENT_AMBULATORY_CARE_PROVIDER_SITE_OTHER): Payer: Medicare Other | Admitting: Pharmacist

## 2020-05-30 ENCOUNTER — Encounter: Payer: Self-pay | Admitting: Internal Medicine

## 2020-05-30 VITALS — BP 130/76 | HR 57 | Temp 97.9°F | Ht 67.0 in | Wt 147.0 lb

## 2020-05-30 DIAGNOSIS — H9193 Unspecified hearing loss, bilateral: Secondary | ICD-10-CM | POA: Diagnosis not present

## 2020-05-30 DIAGNOSIS — M5416 Radiculopathy, lumbar region: Secondary | ICD-10-CM

## 2020-05-30 DIAGNOSIS — H409 Unspecified glaucoma: Secondary | ICD-10-CM

## 2020-05-30 DIAGNOSIS — N401 Enlarged prostate with lower urinary tract symptoms: Secondary | ICD-10-CM

## 2020-05-30 DIAGNOSIS — H60503 Unspecified acute noninfective otitis externa, bilateral: Secondary | ICD-10-CM

## 2020-05-30 DIAGNOSIS — K5904 Chronic idiopathic constipation: Secondary | ICD-10-CM

## 2020-05-30 DIAGNOSIS — I10 Essential (primary) hypertension: Secondary | ICD-10-CM

## 2020-05-30 DIAGNOSIS — R569 Unspecified convulsions: Secondary | ICD-10-CM

## 2020-05-30 DIAGNOSIS — R350 Frequency of micturition: Secondary | ICD-10-CM | POA: Diagnosis not present

## 2020-05-30 MED ORDER — NEOMYCIN-POLYMYXIN-HC 1 % OT SOLN: 3.0000 [drp] | Freq: Four times a day (QID) | OTIC | 0 refills | Status: AC

## 2020-05-30 NOTE — Patient Instructions (Signed)
Your ears were irrigated of wax today  Please take all new medication as prescribed - the ear drops  Please continue all other medications as before, and refills have been done if requested.  Please have the pharmacy call with any other refills you may need.  Please keep your appointments with your specialists as you may have planned

## 2020-05-30 NOTE — Progress Notes (Signed)
Chronic Care Management Pharmacy Note  05/30/2020 Name:  Evan Escobar MRN:  [id number] DOB:  [birth year]  Subjective: Evan Escobar is an 80 y.o. year old male who is a primary patient of [name], MD.  The CCM team was consulted for assistance with disease management and care coordination needs.    Engaged with patient face to face for initial visit in response to provider referral for pharmacy case management and/or care coordination services.   Consent to Services:  The patient was given the following information about Chronic Care Management services today, agreed to services, and gave verbal consent: 1. CCM service includes personalized support from designated clinical staff supervised by the primary care provider, including individualized plan of care and coordination with other care providers 2. 24/7 contact phone numbers for assistance for urgent and routine care needs. 3. Service will only be billed when office clinical staff spend 20 minutes or more in a month to coordinate care. 4. Only one practitioner may furnish and bill the service in a calendar month. 5.The patient may stop CCM services at any time (effective at the end of the month) by phone call to the office staff. 6. The patient will be responsible for cost sharing (co-pay) of up to 20% of the service fee (after annual deductible is met). Patient agreed to services and consent obtained.  Patient Care Team: [name], MD as PCP - General (Internal Medicine) Marcial Pacas, MD as Consulting Physician (Neurology) Paulla Dolly Tamala Fothergill, DPM as Consulting Physician (Podiatry) Charlton Haws, [facility] as Pharmacist (Pharmacist)   Patient lives at home with wife. 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   Recent office visits: 03/09/20 Dr Sharlet Salina VV: cough, rx'd benonatate, promethazine-DM 02/06/20 Dr Sharlet Salina OV: f/u back pain, BP, arm pain. Increased  lisinopril to 20 mg for BP 154/84. Rx'd prednisone for pain.  Recent consult visits: 01/28/20 Urgent Care: bilateral sciatica. Rx'd tizanidine  01/05/20 Dr Venetia Maxon (ophthalmology): f/u glaucoma  12/27/19 NP Chester Holstein (GI): chronic constipation - using MOM twice a week. Advised minimum 48 oz water daily. Add benefiber BID. Consider Linzess again in future - pt did not give it adequate trial.  11/28/19 NP Butler Denmark (neurology): f/u seizures. Last seizure 2014 (only seizure- Hx cocaine use). Decided to continue Keppra for now, may consider stopping in future.  Hospital visits: None in previous 6 months  Objective:  Lab Results  Component Value Date   CREATININE 1.28 (H) 10/13/2019   BUN 15 10/13/2019   GFR 67.60 07/14/2019   GFRNONAA 53 (L) 10/13/2019   GFRAA 62 10/13/2019   NA 136 10/13/2019   K 4.3 10/13/2019   CALCIUM 9.6 10/13/2019   CO2 30 10/13/2019   GLUCOSE 88 10/13/2019    Lab Results  Component Value Date/Time   HGBA1C 5.8 01/10/2019 08:51 AM   GFR 67.60 07/14/2019 08:48 AM   GFR 77.63 01/10/2019 08:51 AM    Last diabetic Eye exam: No results found for: HMDIABEYEEXA  Last diabetic Foot exam: No results found for: HMDIABFOOTEX   Lab Results  Component Value Date   CHOL 181 07/14/2019   HDL 64.50 07/14/2019   LDLCALC 97 07/14/2019   TRIG 99.0 07/14/2019   CHOLHDL 3 07/14/2019    Hepatic Function Latest Ref Rng & Units 10/13/2019 07/14/2019 01/10/2019  Total Protein 6.1 - 8.1 g/dL 7.1 6.8 7.1  Albumin 3.5 - 5.2 g/dL - 4.1 4.0  AST 10 - 35  U/L '15 17 20  ' ALT 9 - 46 U/L 7(L) 9 13  Alk Phosphatase 39 - 117 U/L - 27(L) 29(L)  Total Bilirubin 0.2 - 1.2 mg/dL 0.4 0.6 0.5  Bilirubin, Direct 0.0 - 0.2 mg/dL 0.1 - -    Lab Results  Component Value Date/Time   TSH 0.96 10/13/2019 03:32 PM   TSH 0.86 01/10/2019 08:51 AM   FREET4 0.95 01/10/2019 08:51 AM    CBC Latest Ref Rng & Units 10/13/2019 07/14/2019 01/10/2019  WBC 3.8 - 10.8 Thousand/uL 4.1 4.2 4.2   Hemoglobin 13.2 - 17.1 g/dL 11.7(L) 11.8(L) 12.1(L)  Hematocrit 38.5 - 50.0 % 35.8(L) 35.6(L) 36.6(L)  Platelets 140 - 400 Thousand/uL 159 160.0 158.0    Lab Results  Component Value Date/Time   VD25OH 47.89 01/10/2019 08:51 AM    Clinical ASCVD: No  The 10-year ASCVD risk score Mikey Bussing DC Jr., et al., 2013) is: 22.5%   Values used to calculate the score:     Age: 74 years     Sex: Male     Is Non-Hispanic African American: Yes     Diabetic: No     Tobacco smoker: No     Systolic Blood Pressure: 159 mmHg     Is BP treated: Yes     HDL Cholesterol: 64.5 mg/dL     Total Cholesterol: 181 mg/dL    Depression screen [facility] 2/9 07/14/2019 07/19/2018 09/11/2017  Decreased Interest 0 0 0  Down, Depressed, Hopeless 0 0 0  PHQ - 2 Score 0 0 0     Social History   Tobacco Use  Smoking Status Former Smoker  . Packs/day: 1.00  . Types: Cigarettes  . Quit date: 02/18/2007  . Years since quitting: 13.2  Smokeless Tobacco Never Used  Tobacco Comment   not sure but a while ago; quit smoking and ETOH   BP Readings from Last 3 Encounters:  05/30/20 130/76  02/06/20 (!) 154/84  01/28/20 (!) 163/68   Pulse Readings from Last 3 Encounters:  05/30/20 (!) 57  02/06/20 (!) 59  01/28/20 68   Wt Readings from Last 3 Encounters:  05/30/20 147 lb (66.7 kg)  02/06/20 145 lb 3.2 oz (65.9 kg)  12/27/19 145 lb 12.8 oz (66.1 kg)   BMI Readings from Last 3 Encounters:  05/30/20 23.02 kg/m  02/06/20 22.74 kg/m  12/27/19 22.84 kg/m    Assessment/Interventions: Review of patient past medical history, allergies, medications, health status, including review of consultants reports, laboratory and other test data, was performed as part of comprehensive evaluation and provision of chronic care management services.   SDOH:  (Social Determinants of Health) assessments and interventions performed: Yes SDOH Interventions   Flowsheet Row Most Recent Value  SDOH Interventions   Financial Strain  Interventions Intervention Not Indicated     SDOH Screenings   Alcohol Screen: Not on file  Depression (PHQ2-9): Low Risk   . PHQ-2 Score: 0  Financial Resource Strain: Low Risk   . Difficulty of Paying Living Expenses: Not hard at all  Food Insecurity: Not on file  Housing: Not on file  Physical Activity: Not on file  Social Connections: Not on file  Stress: Not on file  Tobacco Use: Medium Risk  . Smoking Tobacco Use: Former Smoker  . Smokeless Tobacco Use: Never Used  Transportation Needs: Not on file    [place]  No Known Allergies  Medications Reviewed Today    Reviewed by Archie Balboa, CMA (Certified Medical Assistant)  on 05/30/20 at 1055  Med List Status: <None>  Medication Order Taking? Sig Documenting Provider Last Dose Status Informant  acetaminophen (TYLENOL) 500 MG tablet [id number] Yes Take 500 mg by mouth every 6 (six) hours as needed for moderate pain or headache.  [provider] Taking Active Self  aspirin 81 MG tablet [id number] Yes Take 81 mg by mouth daily. [provider] Taking Active Self  brimonidine (ALPHAGAN P) 0.1 % SOLN [id number] Yes Place 1 drop 3 (three) times daily into both eyes. [provider] Taking Active Self  cycloSPORINE, PF, (CEQUA) 0.09 % SOLN [id number] Yes Apply 1 drop to eye 2 (two) times daily. [provider] Taking Active   latanoprost (XALATAN) 0.005 % ophthalmic solution [id number] Yes Place 1 drop into both eyes at bedtime.  [provider] Taking Active Self  levETIRAcetam (KEPPRA) 500 MG tablet [id number] Yes Take 1 tablet (500 mg total) by mouth 2 (two) times daily. Suzzanne Cloud, NP Taking Active   lisinopril (ZESTRIL) 20 MG tablet [id number] Yes Take 1 tablet (20 mg total) by mouth daily. [name], MD Taking Active   magnesium hydroxide (MILK OF MAGNESIA) 400 MG/5ML suspension [id number] Yes Take 30 mLs by mouth as needed for mild constipation. [provider] Taking Active   Multiple Vitamin (MULTIVITAMIN) tablet [id number] Yes Take 1 tablet by mouth daily. [provider] Taking Active Self  tamsulosin (FLOMAX) 0.4 MG CAPS capsule [id number] Yes TAKE 1 CAPSULE(0.4 MG) BY MOUTH DAILY [name], MD Taking Active           Patient Active Problem List   Diagnosis Date Noted  . Left arm pain 02/06/2020  . Iron deficiency anemia secondary to inadequate dietary iron intake 10/14/2019  . Deficiency anemia 10/13/2019  . Cough 10/13/2019  . Chronic idiopathic constipation 10/13/2019  . Cervical radiculopathy 01/10/2019  . Eye problem 01/10/2019  . Left lumbar radiculopathy 12/09/2017  . Hearing loss 09/09/2016  . Benign prostatic hyperplasia 08/28/2015  . Routine general medical examination at a health care facility 08/28/2015  . Allergic rhinitis 02/26/2015  . Glaucoma 05/07/2013  . Essential hypertension, benign 05/07/2013  . Seizures ([place]) 03/17/2013  . Normocytic anemia 09/25/2012    Immunization History  Administered Date(s) Administered  . Fluad Quad(high Dose 65+) 12/17/2018, 11/15/2019  . Influenza, High Dose Seasonal PF 11/17/2012, 11/18/2016, 12/09/2017  . Influenza,inj,Quad PF,6+ Mos 11/14/2013, 01/01/2016  . Influenza-Unspecified 01/18/2015, 01/04/2017, 12/02/2018  . PFIZER(Purple Top)SARS-COV-2 Vaccination 04/15/2019, 05/10/2019  . Pneumococcal Conjugate-13 04/16/2015  . Pneumococcal Polysaccharide-23 11/15/2019  . Pneumococcal-Unspecified 02/17/2010  . Tdap 08/17/2014    Conditions to be addressed/monitored:  Hypertension, Hyperlipidemia, Osteoarthritis, BPH and Constipation, Glaucoma  Care Plan : [place]  Updates made by Charlton Haws, [employer] since 05/30/2020 12:00 AM    Problem: Hypertension, Hyperlipidemia, Osteoarthritis, BPH and Constipation, Glaucoma   Priority: High    Long-Range Goal: Disease management   Start Date: 05/30/2020  Expected End Date: 11/29/2020  This  Visit's Progress: On track  Priority: High  Note:   Current Barriers:  . Unable to independently monitor therapeutic efficacy . Unable to achieve control of arthritis, constipation   Pharmacist Clinical Goal(s):  Marland Kitchen Patient will achieve adherence to monitoring guidelines and medication adherence to achieve therapeutic efficacy . achieve control of arthritis, constipation as evidenced by patient report through collaboration with PharmD and provider.   Interventions: . 1:1 collaboration with [name], MD regarding development and update of comprehensive plan of  care as evidenced by provider attestation and co-signature . Inter-disciplinary care team collaboration (see longitudinal plan of care) . Comprehensive medication review performed; medication list updated in electronic medical record  Hypertension (BP goal <130/80) -Controlled - BP low-normal at home per patient; he denies side effects, dizziness, lightheadedness -Current treatment: . Lisinopril 20 mg daily -Current home readings: 104/55 -Denies hypotensive/hypertensive symptoms -Educated on BP goals and benefits of medications for prevention of heart attack, stroke and kidney damage; Importance of home blood pressure monitoring; Symptoms of hypotension and importance of maintaining adequate hydration; -Counseled to monitor BP at home daily, document, and provide log at future appointments -Recommended to continue current medication  BPH (Goal: reduce urinary symptoms) -Not ideally controlled - pt reports frequent urination, gets up multiple times per night to urinate -Current treatment  . Tamsulosin 0.4 mg daily -Medications previously tried: solifenacin -Counseled on BPH, indication and benefits of tamsulosin;  -Recommended to continue current medication  -Recommended to avoid fluid intake after 6pm  Seizures (Goal: prevent seizures) -Controlled  -follows with Dr Krista Blue, Butler Denmark NP. Last (only) seizure 2014  in s/o cocaine/alcohol withdrawal. -Current treatment  . Levetiracetam 500 mg BID -Educated on seizure threshold and risk of seizure after stopping an anticonvulsant -Advised to discuss need for continued treatment with neurologist  Osteoarthritis (Goal: manage pain) -Not ideally controlled - pt reports joint pain throughout the day, worst in knees, hips, shoulders -Current treatment  . Tylenol 500 mg - 2 tab BID -Recommended to try Voltaren gel  Glaucoma (Goal: maintain IOP) -Not ideally controlled - pt reports blurred vision after using eye drops that lasts the whole day; he particularly notices it after using Cequa drops in AM -Current treatment  . Brimonidine (Alphagan) 0.1% eye drops TID . Cequa (cyclosporne) 0.09% eye drops BID . Latanoprost 0.005% eye drops HS -Recommended trial of Cequa to see effect on blurred vision/dryness -Advised to follow up with eye doctor as scheduled  Chronic constipation (Goal: improve symptoms) -Not ideally controlled - pt reports constipation still comes and goes, there has been some improvement since starting Benefiber but still suffers constipation most days -Current treatment  . Benefiber TID . Milk of magnesia -Medications previously tried: Linzess -Recommended to start colace 100 mg - 2 tab daily  Health Maintenance -Vaccine gaps: covid booster, Shingrix -Current therapy:  . Aspirin 81 mg daily . Milk of magnesia prn . Multivitamin  -Patient is satisfied with current therapy and denies issues -Recommended to continue current medication  Patient Goals/Self-Care Activities . Patient will:  - take medications as prescribed focus on medication adherence by routine check blood pressure daily, document, and provide at future appointments  Follow Up Plan: Telephone follow up appointment with care management team member scheduled for: 3 months      Medication Assistance: None required.  Patient affirms current coverage meets  needs.  Patient's preferred pharmacy is:  [facility] [place], [place] - [address] AT [place] [address] Phone: [phone] Fax: [phone]  [facility] [place], [place] [address] Phone: [phone] Fax: [phone]  Uses pill box? No - prefers bottles Pt endorses 100% compliance  We discussed: Current pharmacy is preferred with insurance plan and patient is satisfied with pharmacy services.  Patient decided to: Continue current medication management strategy  Care Plan and  Follow Up Patient Decision:  Patient agrees to Care Plan and Follow-up.  Plan: Telephone follow up appointment with care management team member scheduled for:  3 months  Charlene Brooke, PharmD, [place], CPP Clinical Pharmacist [facility] at [facility] [phone]

## 2020-05-30 NOTE — Patient Instructions (Addendum)
Pharmacist Phone Number: [phone]  Thank you for meeting with me to discuss your medications! I look forward to working with you to achieve your health care goals. Below is a summary of what we talked about during the visit:  Try VOLTAREN GEL for arthritis pain. Apply to painful joints as needed up to 4 times daily. Continue Tylenol as well.  Try COLACE (DOCUSATE) STOOL SOFTENER - 2 tablets daily to help with constipation. Continue Benefiber as well.  Take a couple days off of [name] and monitor blurriness / dryness.  Avoid drinking water after 5-6 PM to help with frequent urination.  Check BLOOD PRESSURE daily and write down values in a log.  Discuss LEVETIRACETAM (KEPPRA) with neurologist (Dr Evan Escobar) - they are considering stopping it in the future since you have only had 1 seizure in 2014.   Goals Addressed            This Visit's Progress   . Track and Manage My Blood Pressure-Hypertension       Timeframe:  Long-Range Goal Priority:  High Start Date:        05/30/20                     Expected End Date:    12/16/20                   Follow Up Date 07/16/20    - check blood pressure daily - write blood pressure results in a log or diary  -Drink at least 48 oz of water daily to maintain BP and help with constipation   Why is this important?    You won't feel high blood pressure, but it can still hurt your blood vessels.   High blood pressure can cause heart or kidney problems. It can also cause a stroke.   Making lifestyle changes like losing a little weight or eating less salt will help.   Checking your blood pressure at home and at different times of the day can help to control blood pressure.   If the doctor prescribes medicine remember to take it the way the doctor ordered.   Call the office if you cannot afford the medicine or if there are questions about it.     Notes:       Patient Care Plan: CCM Pharmacy Care Plan    Problem Identified:  Hypertension, Hyperlipidemia, Osteoarthritis, BPH and Constipation, Glaucoma   Priority: High    Long-Range Goal: Disease management   Start Date: 05/30/2020  Expected End Date: 11/29/2020  This Visit's Progress: On track  Priority: High  Note:   Current Barriers:  . Unable to independently monitor therapeutic efficacy . Unable to achieve control of arthritis, constipation   Pharmacist Clinical Goal(s):  Marland Kitchen Patient will achieve adherence to monitoring guidelines and medication adherence to achieve therapeutic efficacy . achieve control of arthritis, constipation as evidenced by patient report through collaboration with PharmD and provider.   Interventions: . 1:1 collaboration with Evan Koch, MD regarding development and update of comprehensive plan of care as evidenced by provider attestation and co-signature . Inter-disciplinary care team collaboration (see longitudinal plan of care) . Comprehensive medication review performed; medication list updated in electronic medical record  Hypertension (BP goal <130/80) -Controlled - BP low-normal at home per patient; he denies side effects, dizziness, lightheadedness -Current treatment: . Lisinopril 20 mg daily -Current home readings: 104/55 -Denies hypotensive/hypertensive symptoms -Educated on BP goals and benefits of medications  for prevention of heart attack, stroke and kidney damage; Importance of home blood pressure monitoring; Symptoms of hypotension and importance of maintaining adequate hydration; -Counseled to monitor BP at home daily, document, and provide log at future appointments -Recommended to continue current medication  BPH (Goal: reduce urinary symptoms) -Not ideally controlled - pt reports frequent urination, gets up multiple times per night to urinate -Current treatment  . Tamsulosin 0.4 mg daily -Medications previously tried: solifenacin -Counseled on BPH, indication and benefits of tamsulosin;   -Recommended to continue current medication  -Recommended to avoid fluid intake after 6pm  Seizures (Goal: prevent seizures) -Controlled  -follows with Dr Evan Escobar, Evan Denmark NP. 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 -Current treatment  . Levetiracetam 500 mg BID -Educated on seizure threshold and risk of seizure after stopping an anticonvulsant -Advised to discuss need for continued treatment with neurologist  Osteoarthritis (Goal: manage pain) -Not ideally controlled - pt reports joint pain throughout the day, worst in knees, hips, shoulders -Current treatment  . Tylenol 500 mg - 2 tab BID -Recommended to try Voltaren gel  Glaucoma (Goal: maintain IOP) -Not ideally controlled - pt reports blurred vision after using eye drops that lasts the whole day; he particularly notices it after using Cequa drops in AM -Current treatment  . Brimonidine (Alphagan) 0.1% eye drops TID . Cequa (cyclosporne) 0.09% eye drops BID . Latanoprost 0.005% eye drops HS -Recommended trial of Cequa to see effect on blurred vision/dryness -Advised to follow up with eye doctor as scheduled  Chronic constipation (Goal: improve symptoms) -Not ideally controlled - pt reports constipation still comes and goes, there has been some improvement since starting Benefiber but still suffers constipation most days -Current treatment  . Benefiber TID . Milk of magnesia -Medications previously tried: Linzess -Recommended to start colace 100 mg - 2 tab daily  Health Maintenance -Vaccine gaps: covid booster, Shingrix -Current therapy:  . Aspirin 81 mg daily . Milk of magnesia prn . Multivitamin  -Patient is satisfied with current therapy and denies issues -Recommended to continue current medication  Patient Goals/Self-Care Activities . Patient will:  - take medications as prescribed focus on medication adherence by routine check blood pressure daily, document, and provide at future  appointments  Follow Up Plan: Telephone follow up appointment with care management team member scheduled for: 3 months      Evan Escobar was given information about Chronic Care Management services today including:  1. CCM service includes personalized support from designated clinical staff supervised by his physician, including individualized plan of care and coordination with other care providers 2. 24/7 contact phone numbers for assistance for urgent and routine care needs. 3. Standard insurance, coinsurance, copays and deductibles apply for chronic care management only during months in which we provide at least 20 minutes of these services. Most insurances cover these services at 100%, however patients may be responsible for any copay, coinsurance and/or deductible if applicable. This service may help you avoid the need for more expensive face-to-face services. 4. Only one practitioner may furnish and bill the service in a calendar month. 5. The patient may stop CCM services at any time (effective at the end of the month) by phone call to the office staff.  Patient agreed to services and verbal consent obtained.   Patient verbalizes understanding of instructions provided today and agrees to view in [place].  Telephone follow up appointment with pharmacy team member scheduled for: 3 months  Charlene Brooke, PharmD, BCACP, CPP Clinical Pharmacist  [facility] [phone]   Chronic Constipation Chronic constipation is a condition in which a person has three or fewer bowel movements a week, for 3 months or longer. This condition is especially common in older adults. What are the causes? Causes of chronic constipation may include:  Not drinking enough fluid, eating enough food or fiber, or getting enough physical activity.  Pregnancy.  A tear in the anus (anal fissure).  Blockage in the bowel (bowel obstruction).  Narrowing of the bowel (bowel  stricture).  Having a long-term medical condition, such as: ? Diabetes, hypothyroidism, or iron-deficiency anemia. ? Stroke or spinal cord injury. ? Multiple sclerosis or Parkinson's disease. ? Colon cancer. ? Dementia. ? Inflammatory bowel disease (IBD), outward collapse of the rectum (rectal prolapse), or hemorrhoids.  Taking certain medicines, including: ? Narcotics. These are a certain type of prescription pain medicine. ? Antacids or iron supplements. ? Water pills (diuretics). ? Certain blood pressure medicines. ? Anti-seizure medicines. ? Antidepressants. ? Medicines for Parkinson's disease. Other causes of this condition may include:  Stress.  Problems in the nerves and muscles that control the movement of stool.  Weak or impaired pelvic floor muscles.   What increases the risk? You may be at higher risk for chronic constipation if:  You are older than age 36.  You are male.  You live in a long-term care facility.  You have a long-term disease.  You have a mental health disorder or eating disorder. What are the signs or symptoms? The main symptom of chronic constipation is having three or fewer bowel movements a week for several weeks. Other signs and symptoms may vary from person to person. These include:  Pushing hard (straining) to pass stool, or having hard or lumpy stools.  Painful bowel movements.  Having lower abdominal discomfort, such as cramps or bloating.  Being unable to have a bowel movement when you feel the urge, or feeling like you still need to pass stool after a bowel movement.  Feeling that you have something in your rectum that is blocking or preventing bowel movements.  Seeing blood on the toilet paper or in your stool.  Worsening confusion (in older adults). How is this diagnosed? This condition may be diagnosed based on:  Your symptoms and medical history. You will be asked about your symptoms, lifestyle, diet, and any medicines  that you are taking.  A physical exam. ? Your abdomen will be examined. ? A digital rectal exam may be done. For this exam, a health care provider places a lubricated, gloved finger into the rectum.  Tests to check for any underlying causes of your constipation. These may be ordered if you have bleeding in your rectum, weight loss, or a family history of colon cancer. In these cases, you may have: ? Imaging studies of the colon. These may include X-ray, ultrasound, or a CT scan. ? Blood tests. ? A procedure to examine the inside of your colon (colonoscopy). ? More specialized tests to check:  Whether your anal sphincter works well. This is a ring-shaped muscle that controls the closing of the anus.  How well food moves through your colon. ? Tests to measure the nerve signal in your pelvic floor muscles (electromyography). How is this treated? Treatment for chronic constipation depends on the cause. Most often, treatment starts with:  Being more active and getting regular exercise.  Drinking more fluids.  Adding fiber to your diet. Sources of fiber include fruits, vegetables, whole grains,  and fiber supplements.  Using medicines such as stool softeners or medicines that increase contractions in your digestive system (pro-motility agents).  Training your pelvic muscles with biofeedback.  Surgery, if there is obstruction. Treatment may also include:  Stopping or changing some medicines if they cause constipation.  Using a fiber supplement (bulk laxative) or stool softener.  Using a prescription laxative. This works by [employer] into your colon (osmotic laxative). You may also need to see a specialist who treats conditions of the digestive system (gastroenterologist).   Follow these instructions at home: Medicines  Take over-the-counter and prescription medicines only as told by your health care provider.  If you are taking a laxative, take it as told by your health care  provider. Eating and drinking  Eat a balanced diet that includes enough fiber. Ask your health care provider to recommend a diet that is right for you.  Drink clear fluids, especially water. Avoid drinking alcohol, caffeine, and soda. These can make constipation worse.  Drink enough fluid to keep your urine pale yellow.   General instructions  Get some physical activity every day. Ask your health care provider what activities are safe for you.  Get colon cancer screenings as told by your health care provider.  Keep all follow-up visits as told by your health care provider. This is important. Contact a health care provider if you have:  Three or fewer bowel movements a week.  Stools that are hard or lumpy.  Blood on the toilet paper or in your stool after you have a bowel movement.  Unexplained weight loss.  Rectum (rectal) pain.  Stool leakage.  Nausea or vomiting. Get help right away if you have:  Rectal bleeding or you pass blood clots.  Severe rectal pain.  Body tissue that pushes out (protrudes) from your anus.  Severe pain or bloating (distension) in your abdomen.  Vomiting that you cannot control. Summary  Chronic constipation is a condition in which a person has three or fewer bowel movements a week, for 3 months or longer.  You may have a higher risk for this condition if you are an older adult, you are male, or you have a long-term disease.  Treatment for this condition depends on the cause. Most treatments for chronic constipation include adding fiber to your diet, drinking more fluids, and getting more physical activity. You may also need to treat any underlying medical conditions or stop or change certain medicines if they cause constipation.  If lifestyle changes do not relieve constipation, your health care provider may recommend taking a laxative. This information is not intended to replace advice given to you by your health care provider. Make sure  you discuss any questions you have with your health care provider. Document Revised: 12/22/2018 Document Reviewed: 12/22/2018 Elsevier Patient Education  [employer].

## 2020-05-30 NOTE — Progress Notes (Signed)
Patient ID: Evan Escobar, male   DOB: August 30, 1940, 80 y.o.   MRN: [id number]        Chief Complaint: bilateral hearing loss and reduced hearing, HTn       HPI:  Evan Escobar is a 80 y.o. male here with c/o 1 wk reduced hearing acute on chronic with mild discomfort on the right, but no fever, chills, vertigo, tinnitus, HA  or falls.  Pt denies chest pain, increased sob or doe, wheezing, orthopnea, PND, increased LE swelling, palpitations, dizziness or syncope.   Pt denies polydipsia, polyuria,  Denies new focal neuro s/s.   Pt denies fever, wt loss, night sweats, loss of appetite, or other constitutional symptoms         Wt Readings from Last 3 Encounters:  05/30/20 147 lb (66.7 kg)  02/06/20 145 lb 3.2 oz (65.9 kg)  12/27/19 145 lb 12.8 oz (66.1 kg)   BP Readings from Last 3 Encounters:  05/30/20 130/76  02/06/20 (!) 154/84  01/28/20 (!) 163/68         Past Medical History:  Diagnosis Date  . Allergy   . Arthritis   . Cataract   . Glaucoma   . Hypertension   . Normocytic anemia 09/25/2012  . Polyp, sigmoid colon 11/05/2011  . Seizure ([place])   . Seizures ([place]) 03/17/2013   Past Surgical History:  Procedure Laterality Date  . ANTERIOR CERVICAL DECOMP/DISCECTOMY FUSION N/A 05/22/2017   Procedure: Anterior Cervical Discectomy Fusion - Cervical Three-Cervical Four - Cervical Four-Cervical Five;  Surgeon: Kary Kos, MD;  Location: [facility];  Service: Neurosurgery;  Laterality: N/A;  . CATARACT EXTRACTION, BILATERAL    . EYE SURGERY    . GLAUCOMA REPAIR  10 yrs ago    reports that he quit smoking about 13 years ago. His smoking use included cigarettes. He smoked 1.00 pack per day. He has never used smokeless tobacco. He reports that he does not drink alcohol and does not use drugs. family history includes Colon cancer in his brother; High blood pressure in his father and mother; Stroke in his father and mother. No Known Allergies Current Outpatient Medications on File Prior to Visit   Medication Sig Dispense Refill  . acetaminophen (TYLENOL) 500 MG tablet Take 500 mg by mouth every 6 (six) hours as needed for moderate pain or headache.     Marland Kitchen aspirin 81 MG tablet Take 81 mg by mouth daily.    . brimonidine (ALPHAGAN P) 0.1 % SOLN Place 1 drop 3 (three) times daily into both eyes.    . cycloSPORINE, PF, (CEQUA) 0.09 % SOLN Apply 1 drop to eye 2 (two) times daily.    Marland Kitchen latanoprost (XALATAN) 0.005 % ophthalmic solution Place 1 drop into both eyes at bedtime.     . levETIRAcetam (KEPPRA) 500 MG tablet Take 1 tablet (500 mg total) by mouth 2 (two) times daily. 180 tablet 4  . lisinopril (ZESTRIL) 20 MG tablet Take 1 tablet (20 mg total) by mouth daily. 90 tablet 3  . magnesium hydroxide (MILK OF MAGNESIA) 400 MG/5ML suspension Take 30 mLs by mouth as needed for mild constipation.    . Multiple Vitamin (MULTIVITAMIN) tablet Take 1 tablet by mouth daily.    . tamsulosin (FLOMAX) 0.4 MG CAPS capsule TAKE 1 CAPSULE(0.4 MG) BY MOUTH DAILY 90 capsule 0   No current facility-administered medications on file prior to visit.        ROS:  All others reviewed and negative.  Objective  PE:  BP 130/76 (BP Location: Right Arm, Patient Position: Sitting, Cuff Size: Normal)   Pulse (!) 57   Temp 97.9 F (36.6 C) (Oral)   Ht 5\' 7"  (1.702 m)   Wt 147 lb (66.7 kg)   SpO2 99%   BMI 23.02 kg/m                 Constitutional: Pt appears in NAD               HENT: Head: NCAT.                Right Ear: External ear normal.                 Left Ear: External ear normal.                 Bilateral wax impactions removed with irrigation               Right > left ext canal red, swelling noted without mucous or other d/c               Eyes: . Pupils are equal, round, and reactive to light. Conjunctivae and EOM are normal               Nose: without d/c or deformity               Neck: Neck supple. Gross normal ROM               Cardiovascular: Normal rate and regular rhythm.                  Pulmonary/Chest: Effort normal and breath sounds without rales or wheezing               Neurological: Pt is alert. At baseline orientation, motor grossly intact               Skin: Skin is warm. No rashes, no other new lesions, LE edema - none               Psychiatric: Pt behavior is normal without agitation   Micro: none  Cardiac tracings I have personally interpreted today:  none  Pertinent Radiological findings (summarize): none   Lab Results  Component Value Date   WBC 4.1 10/13/2019   HGB 11.7 (L) 10/13/2019   HCT 35.8 (L) 10/13/2019   PLT 159 10/13/2019   GLUCOSE 88 10/13/2019   CHOL 181 07/14/2019   TRIG 99.0 07/14/2019   HDL 64.50 07/14/2019   LDLCALC 97 07/14/2019   ALT 7 (L) 10/13/2019   AST 15 10/13/2019   NA 136 10/13/2019   K 4.3 10/13/2019   CL 101 10/13/2019   CREATININE 1.28 (H) 10/13/2019   BUN 15 10/13/2019   CO2 30 10/13/2019   TSH 0.96 10/13/2019   PSA 3.27 11/10/2017   HGBA1C 5.8 01/10/2019   Assessment/Plan:  Evan Escobar is a 80 y.o. Black or African American [2] male with  has a past medical history of Allergy, Arthritis, Cataract, Glaucoma, Hypertension, Normocytic anemia (09/25/2012), Polyp, sigmoid colon (11/05/2011), Seizure ([facility]), and Seizures ([place]) (03/17/2013).  Essential hypertension, benign BP Readings from Last 3 Encounters:  05/30/20 130/76  02/06/20 (!) 154/84  01/28/20 (!) 163/68   improved, pt to continue medical treatment  - zestril 20   Hearing loss Improved with wax impaction removed, consider ENT f/u and audiology eval for further hearing loss  External otitis Mild to mod, for antibx  course,  to f/u any worsening symptoms or concerns  Followup: Return if symptoms worsen or fail to improve.  Cathlean Cower, MD 06/02/2020 10:35 PM [facility]

## 2020-06-02 ENCOUNTER — Encounter: Payer: Self-pay | Admitting: Internal Medicine

## 2020-06-02 DIAGNOSIS — H6090 Unspecified otitis externa, unspecified ear: Secondary | ICD-10-CM | POA: Insufficient documentation

## 2020-06-02 NOTE — Assessment & Plan Note (Signed)
Improved with wax impaction removed, consider ENT f/u and audiology eval for further hearing loss

## 2020-06-02 NOTE — Assessment & Plan Note (Signed)
BP Readings from Last 3 Encounters:  05/30/20 130/76  02/06/20 (!) 154/84  01/28/20 (!) 163/68   improved, pt to continue medical treatment  - zestril 20

## 2020-06-02 NOTE — Assessment & Plan Note (Signed)
Mild to mod, for antibx course,  to f/u any worsening symptoms or concerns 

## 2020-06-03 ENCOUNTER — Other Ambulatory Visit: Payer: Self-pay | Admitting: Internal Medicine

## 2020-06-06 NOTE — Telephone Encounter (Signed)
Patient calling for status of refill, no tamsulosin (FLOMAX) 0.4 MG CAPS capsule remaining

## 2020-06-09 ENCOUNTER — Other Ambulatory Visit: Payer: Self-pay

## 2020-06-09 ENCOUNTER — Encounter (HOSPITAL_COMMUNITY): Payer: Self-pay

## 2020-06-09 ENCOUNTER — Ambulatory Visit (HOSPITAL_COMMUNITY)
Admission: EM | Admit: 2020-06-09 | Discharge: 2020-06-09 | Disposition: A | Discharge status: Home / Self Care | Payer: Medicare Other | Attending: Physician Assistant | Admitting: Physician Assistant

## 2020-06-09 DIAGNOSIS — H6122 Impacted cerumen, left ear: Secondary | ICD-10-CM | POA: Diagnosis not present

## 2020-06-09 NOTE — ED Triage Notes (Addendum)
Pt present left ear fullness, pt states that he cannot hear any thing out his ear. Pt was recently on giving ear drops for an ear infection.

## 2020-06-09 NOTE — Discharge Instructions (Addendum)
Your ear was clogged with wax but we got this out during your visit.  If you have any ear pain or drainage please come back to see [place].  Do not use any Q-tips or place anything in your ears.

## 2020-06-09 NOTE — ED Provider Notes (Signed)
[place]    CSN: [id number] Arrival date & time: 06/09/20  1040      History   Chief Complaint Chief Complaint  Patient presents with  . Ear Fullness    HPI Evan Escobar is a 80 y.o. male.   Patient presents today with a 2-day history of worsening decreased hearing in his left ear.  He reports similar symptoms in his right and went to his PCP several weeks ago and had his ears flushed.  They flushed brush ears but he had difficulty tolerating this on the left and was prescribed Cortisporin.  He has completed course of this medication and has noticed worsening feeling of fullness and decreased hearing in the left ear.  He does not use any Q-tips.  He wears hearing aid on the right but does not wear it on the left.  He denies any significant otalgia, otorrhea, fever, nasal congestion.  Denies recent illness or additional antibiotic use.  He denies recent swimming or airplane travel.  He has not tried any over-the-counter medications for symptom management.     Past Medical History:  Diagnosis Date  . Allergy   . Arthritis   . Cataract   . Glaucoma   . Hypertension   . Normocytic anemia 09/25/2012  . Polyp, sigmoid colon 11/05/2011  . Seizure ([place])   . Seizures ([place]) 03/17/2013    Patient Active Problem List   Diagnosis Date Noted  . External otitis 06/02/2020  . Left arm pain 02/06/2020  . Iron deficiency anemia secondary to inadequate dietary iron intake 10/14/2019  . Deficiency anemia 10/13/2019  . Cough 10/13/2019  . Chronic idiopathic constipation 10/13/2019  . Cervical radiculopathy 01/10/2019  . Eye problem 01/10/2019  . Left lumbar radiculopathy 12/09/2017  . Hearing loss 09/09/2016  . Benign prostatic hyperplasia 08/28/2015  . Routine general medical examination at a health care facility 08/28/2015  . Allergic rhinitis 02/26/2015  . Glaucoma 05/07/2013  . Essential hypertension, benign 05/07/2013  . Seizures ([place]) 03/17/2013  . Normocytic  anemia 09/25/2012    Past Surgical History:  Procedure Laterality Date  . ANTERIOR CERVICAL DECOMP/DISCECTOMY FUSION N/A 05/22/2017   Procedure: Anterior Cervical Discectomy Fusion - Cervical Three-Cervical Four - Cervical Four-Cervical Five;  Surgeon: Kary Kos, MD;  Location: [facility];  Service: Neurosurgery;  Laterality: N/A;  . CATARACT EXTRACTION, BILATERAL    . EYE SURGERY    . GLAUCOMA REPAIR  10 yrs ago       Home Medications    Prior to Admission medications   Medication Sig Start Date End Date Taking? Authorizing Provider  acetaminophen (TYLENOL) 500 MG tablet Take 500 mg by mouth every 6 (six) hours as needed for moderate pain or headache.     [provider]  aspirin 81 MG tablet Take 81 mg by mouth daily.    [provider]  brimonidine (ALPHAGAN P) 0.1 % SOLN Place 1 drop 3 (three) times daily into both eyes.    [provider]  cycloSPORINE, PF, (CEQUA) 0.09 % SOLN Apply 1 drop to eye 2 (two) times daily.    [provider]  latanoprost (XALATAN) 0.005 % ophthalmic solution Place 1 drop into both eyes at bedtime.  09/20/12   [provider]  levETIRAcetam (KEPPRA) 500 MG tablet Take 1 tablet (500 mg total) by mouth 2 (two) times daily. 11/28/19   Suzzanne Cloud, NP  lisinopril (ZESTRIL) 20 MG tablet Take 1 tablet (20 mg total) by mouth daily. 02/06/20  [name], MD  magnesium hydroxide (MILK OF MAGNESIA) 400 MG/5ML suspension Take 30 mLs by mouth as needed for mild constipation.    [provider]  Multiple Vitamin (MULTIVITAMIN) tablet Take 1 tablet by mouth daily.    [provider]  NEOMYCIN-POLYMYXIN-HYDROCORTISONE (CORTISPORIN) 1 % SOLN OTIC solution Place 3 drops into both ears 4 (four) times daily for 10 days. 05/30/20 06/09/20  Biagio Borg, MD  tamsulosin (FLOMAX) 0.4 MG CAPS capsule TAKE 1 CAPSULE(0.4 MG) BY MOUTH DAILY 06/06/20   [name], MD    Family History Family History   Problem Relation Age of Onset  . Stroke Mother   . High blood pressure Mother   . Stroke Father   . High blood pressure Father   . Colon cancer Brother   . Esophageal cancer Neg Hx   . Pancreatic cancer Neg Hx   . Rectal cancer Neg Hx   . Stomach cancer Neg Hx     Social History Social History   Tobacco Use  . Smoking status: Former Smoker    Packs/day: 1.00    Types: Cigarettes    Quit date: 02/18/2007    Years since quitting: 13.3  . Smokeless tobacco: Never Used  . Tobacco comment: not sure but a while ago; quit smoking and ETOH  Vaping Use  . Vaping Use: Never used  Substance Use Topics  . Alcohol use: No    Alcohol/week: 0.0 standard drinks    Comment: quit in 2009  . Drug use: No     Allergies   Patient has no known allergies.   Review of Systems Review of Systems  Constitutional: Negative for activity change, appetite change and fatigue.  HENT: Positive for ear pain and hearing loss. Negative for congestion, ear discharge, sinus pressure, sneezing and sore throat.   Respiratory: Negative for cough and shortness of breath.   Cardiovascular: Negative for chest pain.  Gastrointestinal: Negative for abdominal pain, diarrhea, nausea and vomiting.  Neurological: Negative for dizziness, light-headedness and headaches.     Physical Exam Triage Vital Signs ED Triage Vitals  Enc Vitals Group     BP 06/09/20 1143 134/61     Pulse Rate 06/09/20 1143 (!) 58     Resp 06/09/20 1143 18     Temp 06/09/20 1143 98.6 F (37 C)     Temp Source 06/09/20 1143 Oral     SpO2 06/09/20 1143 100 %     Weight --      Height --      Head Circumference --      Peak Flow --      Pain Score 06/09/20 1144 0     Pain Loc --      Pain Edu? --      Excl. in [place]? --    No data found.  Updated Vital Signs BP 134/61 (BP Location: Right Arm)   Pulse (!) 58   Temp 98.6 F (37 C) (Oral)   Resp 18   SpO2 100%   Visual Acuity Right Eye Distance:   Left Eye Distance:    Bilateral Distance:    Right Eye Near:   Left Eye Near:    Bilateral Near:     Physical Exam Vitals reviewed.  Constitutional:      General: He is awake.     Appearance: Normal appearance. He is normal weight. He is not ill-appearing.     Comments: Very pleasant male appears stated age in no  acute distress  HENT:     Head: Normocephalic and atraumatic.     Right Ear: Tympanic membrane, ear canal and external ear normal. Tympanic membrane is not erythematous or bulging.     Left Ear: Tympanic membrane, ear canal and external ear normal. There is no impacted cerumen (resolved). Tympanic membrane is not erythematous or bulging.     Ears:     Comments: Cerumen impaction resolved with in office irrigation revealing normal TM and ear canal.    Nose: Nose normal.     Mouth/Throat:     Pharynx: Uvula midline. No oropharyngeal exudate, posterior oropharyngeal erythema or uvula swelling.  Cardiovascular:     Rate and Rhythm: Normal rate and regular rhythm.     Heart sounds: No murmur heard.   Pulmonary:     Effort: Pulmonary effort is normal. No accessory muscle usage or respiratory distress.     Breath sounds: Normal breath sounds. No stridor. No wheezing, rhonchi or rales.     Comments: Clear to auscultation bilaterally Abdominal:     General: Bowel sounds are normal.     Palpations: Abdomen is soft.     Tenderness: There is no abdominal tenderness.  Lymphadenopathy:     Head:     Right side of head: No submental, submandibular or tonsillar adenopathy.     Left side of head: No submental, submandibular or tonsillar adenopathy.     Cervical: No cervical adenopathy.  Neurological:     Mental Status: He is alert.  Psychiatric:        Behavior: Behavior is cooperative.      UC Treatments / Results  Labs (all labs ordered are listed, but only abnormal results are displayed) Labs Reviewed - No data to display  EKG   Radiology No results found.  Procedures Procedures  (including critical care time)  Medications Ordered in UC Medications - No data to display  Initial Impression / Assessment and Plan / UC Course  I have reviewed the triage vital signs and the nursing notes.  Pertinent labs & imaging results that were available during my care of the patient were reviewed by me and considered in my medical decision making (see chart for details).     Cerumen impaction resolved with in office irrigation.  Patient reports resolution of symptoms and denies any ongoing pain.  Ear canal and TM appear normal.  Patient was discouraged from using Q-tips or other things in his ear.  He is to follow-up with PCP as needed.  Final Clinical Impressions(s) / UC Diagnoses   Final diagnoses:  Hearing loss of left ear due to cerumen impaction  Impacted cerumen of left ear     Discharge Instructions     Your ear was clogged with wax but we got this out during your visit.  If you have any ear pain or drainage please come back to see [place].  Do not use any Q-tips or place anything in your ears.    ED Prescriptions    None     PDMP not reviewed this encounter.   Terrilee Croak, PA-C 06/09/20 1351

## 2020-06-11 ENCOUNTER — Other Ambulatory Visit: Payer: Self-pay

## 2020-06-11 ENCOUNTER — Encounter (INDEPENDENT_AMBULATORY_CARE_PROVIDER_SITE_OTHER): Payer: Medicare Other | Admitting: Ophthalmology

## 2020-06-11 DIAGNOSIS — H348122 Central retinal vein occlusion, left eye, stable: Secondary | ICD-10-CM

## 2020-06-11 DIAGNOSIS — H35033 Hypertensive retinopathy, bilateral: Secondary | ICD-10-CM | POA: Diagnosis not present

## 2020-06-11 DIAGNOSIS — H34831 Tributary (branch) retinal vein occlusion, right eye, with macular edema: Secondary | ICD-10-CM

## 2020-06-11 DIAGNOSIS — H43813 Vitreous degeneration, bilateral: Secondary | ICD-10-CM

## 2020-06-11 DIAGNOSIS — I10 Essential (primary) hypertension: Secondary | ICD-10-CM | POA: Diagnosis not present

## 2020-06-21 ENCOUNTER — Telehealth: Payer: Self-pay | Admitting: Pharmacist

## 2020-06-21 NOTE — Progress Notes (Addendum)
Chronic Care Management Pharmacy Assistant   Name: Evan Escobar  MRN: [id number] DOB: [birth year]  Reason for Encounter: Disease State - Hypertension  Recent office visits:  Jenny Reichmann (PCP) - Ear Pain both ears. Start CORTISPORIN 1% drops. F/u prn.  Recent consult visits:  06/09/20 urgent care: cerumen impaction  Hospital visits:  None in previous 6 months  Medications: Outpatient Encounter Medications as of 06/21/2020  Medication Sig   acetaminophen (TYLENOL) 500 MG tablet Take 500 mg by mouth every 6 (six) hours as needed for moderate pain or headache.    aspirin 81 MG tablet Take 81 mg by mouth daily.   brimonidine (ALPHAGAN P) 0.1 % SOLN Place 1 drop 3 (three) times daily into both eyes.   cycloSPORINE, PF, (CEQUA) 0.09 % SOLN Apply 1 drop to eye 2 (two) times daily.   latanoprost (XALATAN) 0.005 % ophthalmic solution Place 1 drop into both eyes at bedtime.    levETIRAcetam (KEPPRA) 500 MG tablet Take 1 tablet (500 mg total) by mouth 2 (two) times daily.   lisinopril (ZESTRIL) 20 MG tablet Take 1 tablet (20 mg total) by mouth daily.   magnesium hydroxide (MILK OF MAGNESIA) 400 MG/5ML suspension Take 30 mLs by mouth as needed for mild constipation.   Multiple Vitamin (MULTIVITAMIN) tablet Take 1 tablet by mouth daily.   tamsulosin (FLOMAX) 0.4 MG CAPS capsule TAKE 1 CAPSULE(0.4 MG) BY MOUTH DAILY   No facility-administered encounter medications on file as of 06/21/2020.   Reviewed chart prior to disease state call. Spoke with patient regarding BP  Recent Office Vitals: BP Readings from Last 3 Encounters:  06/09/20 134/61  05/30/20 130/76  02/06/20 (!) 154/84   Pulse Readings from Last 3 Encounters:  06/09/20 (!) 58  05/30/20 (!) 57  02/06/20 (!) 59    Wt Readings from Last 3 Encounters:  05/30/20 147 lb (66.7 kg)  02/06/20 145 lb 3.2 oz (65.9 kg)  12/27/19 145 lb 12.8 oz (66.1 kg)     Kidney Function Lab Results  Component Value Date/Time   CREATININE 1.28 (H)  10/13/2019 03:32 PM   CREATININE 1.25 07/14/2019 08:48 AM   CREATININE 1.11 01/10/2019 08:51 AM   GFR 67.60 07/14/2019 08:48 AM   GFRNONAA 53 (L) 10/13/2019 03:32 PM   GFRAA 62 10/13/2019 03:32 PM    BMP Latest Ref Rng & Units 10/13/2019 07/14/2019 01/10/2019  Glucose 65 - 99 mg/dL 88 120(H) 92  BUN 7 - 25 mg/dL 15 19 16   Creatinine 0.70 - 1.18 mg/dL 1.28(H) 1.25 1.11  BUN/Creat Ratio 6 - 22 (calc) 12 - -  Sodium 135 - 146 mmol/L 136 133(L) 138  Potassium 3.5 - 5.3 mmol/L 4.3 4.2 4.2  Chloride 98 - 110 mmol/L 101 101 103  CO2 20 - 32 mmol/L 30 27 26   Calcium 8.6 - 10.3 mg/dL 9.6 9.1 9.4    Current antihypertensive regimen:  Lisinopril 20 mg daily  How often are you checking your Blood Pressure? daily   Current home BP readings:   Patient states he hasn't checked his BP today.  5/4 - 128/59  5/3 - 145/61   5/2 - 142/64  What recent interventions/DTPs have been made by any provider to improve Blood Pressure control since last CPP Visit:   None noted   Any recent hospitalizations or ED visits since last visit with CPP? Yes   What diet changes have been made to improve Blood Pressure Control?  Patient states he need to work on his diet  because he stills experience constipation.  What exercise is being done to improve your Blood Pressure Control?  Patient states he and his wife walks in the mornings if weather permits.  Adherence Review: Is the patient currently on ACE/ARB medication? Yes Does the patient have >5 day gap between last estimated fill dates? Yes Lisinopril - last fill 03/19/20 90D Patient states he was waiting on PCP to refill and has since picked up medications.  Star Rating Drugs: Lisinopril - last fill 03/19/20 90D  Patient states he seen Dr. Zigmund Daniel a week ago for his dry eye/blurred vision and was given an eye injection for treatment. Patient states he can not tolerate the eye injections and refuse the rest of the treatments.  Orinda Kenner,  [facility] Clinical Pharmacists Assistant [phone]  Time Spent: 17

## 2020-07-27 ENCOUNTER — Ambulatory Visit: Payer: Medicare Other | Admitting: Podiatry

## 2020-07-27 ENCOUNTER — Other Ambulatory Visit: Payer: Self-pay

## 2020-07-27 DIAGNOSIS — B351 Tinea unguium: Secondary | ICD-10-CM

## 2020-07-27 DIAGNOSIS — M79675 Pain in left toe(s): Secondary | ICD-10-CM | POA: Diagnosis not present

## 2020-07-27 DIAGNOSIS — M79674 Pain in right toe(s): Secondary | ICD-10-CM

## 2020-08-01 ENCOUNTER — Encounter: Payer: Self-pay | Admitting: Podiatry

## 2020-08-01 NOTE — Progress Notes (Signed)
Subjective: Evan Escobar is a pleasant 80 y.o. male patient seen today painful thick toenails that are difficult to trim. Pain interferes with ambulation. Aggravating factors include wearing enclosed shoe gear. Pain is relieved with periodic professional debridement.  PCP is [name], MD. Last visit was: 02/06/2020.  No Known Allergies  Objective: Physical Exam  General: Evan Escobar is a pleasant 80 y.o. African American male, WD, WN in NAD. AAO x 3.   Vascular:  Capillary refill time to digits immediate b/l. Palpable pedal pulses b/l LE. Pedal hair sparse. Lower extremity skin temperature gradient within normal limits. No pain with calf compression b/l. No edema noted b/l lower extremities.  Dermatological:  Pedal skin with normal turgor, texture and tone bilaterally. No open wounds bilaterally. No interdigital macerations bilaterally. Toenails 1-5 b/l elongated, discolored, dystrophic, thickened, crumbly with subungual debris and tenderness to dorsal palpation.  Musculoskeletal:  Normal muscle strength 5/5 to all lower extremity muscle groups bilaterally. No pain crepitus or joint limitation noted with ROM b/l. Hallux valgus with bunion deformity noted b/l lower extremities. Hammertoe(s) noted to the 2-5 bilaterally.  Neurological:  Protective sensation intact 5/5 intact bilaterally with 10g monofilament b/l. Vibratory sensation intact b/l.  Assessment and Plan:  1. Pain due to onychomycosis of toenails of both feet      -Examined patient. -Patient to continue soft, supportive shoe gear daily. -Toenails 1-5 b/l were debrided in length and girth with sterile nail nippers and dremel without iatrogenic bleeding.  -Patient to report any pedal injuries to medical professional immediately. -Patient/POA to call should there be question/concern in the interim.  Return in about 3 months (around 10/27/2020).  Marzetta Board, DPM

## 2020-08-07 DIAGNOSIS — H401133 Primary open-angle glaucoma, bilateral, severe stage: Secondary | ICD-10-CM | POA: Diagnosis not present

## 2020-08-07 DIAGNOSIS — H16223 Keratoconjunctivitis sicca, not specified as Sjogren's, bilateral: Secondary | ICD-10-CM | POA: Diagnosis not present

## 2020-08-07 DIAGNOSIS — H348121 Central retinal vein occlusion, left eye, with retinal neovascularization: Secondary | ICD-10-CM | POA: Diagnosis not present

## 2020-08-07 DIAGNOSIS — H348312 Tributary (branch) retinal vein occlusion, right eye, stable: Secondary | ICD-10-CM | POA: Diagnosis not present

## 2020-08-07 DIAGNOSIS — H3562 Retinal hemorrhage, left eye: Secondary | ICD-10-CM | POA: Diagnosis not present

## 2020-08-13 ENCOUNTER — Encounter: Payer: Self-pay | Admitting: Internal Medicine

## 2020-08-13 ENCOUNTER — Ambulatory Visit (INDEPENDENT_AMBULATORY_CARE_PROVIDER_SITE_OTHER): Payer: Medicare Other | Admitting: Internal Medicine

## 2020-08-13 ENCOUNTER — Other Ambulatory Visit: Payer: Self-pay

## 2020-08-13 VITALS — BP 124/68 | HR 59 | Temp 98.0°F | Resp 18 | Ht 67.0 in | Wt 143.4 lb

## 2020-08-13 DIAGNOSIS — I10 Essential (primary) hypertension: Secondary | ICD-10-CM | POA: Diagnosis not present

## 2020-08-13 DIAGNOSIS — Z0000 Encounter for general adult medical examination without abnormal findings: Secondary | ICD-10-CM

## 2020-08-13 DIAGNOSIS — K5904 Chronic idiopathic constipation: Secondary | ICD-10-CM | POA: Diagnosis not present

## 2020-08-13 DIAGNOSIS — D539 Nutritional anemia, unspecified: Secondary | ICD-10-CM

## 2020-08-13 DIAGNOSIS — R350 Frequency of micturition: Secondary | ICD-10-CM | POA: Diagnosis not present

## 2020-08-13 DIAGNOSIS — N401 Enlarged prostate with lower urinary tract symptoms: Secondary | ICD-10-CM

## 2020-08-13 DIAGNOSIS — R569 Unspecified convulsions: Secondary | ICD-10-CM

## 2020-08-13 LAB — CBC
HCT: 33.3 % — ABNORMAL LOW (ref 39.0–52.0)
Hemoglobin: 11.1 g/dL — ABNORMAL LOW (ref 13.0–17.0)
MCHC: 33.4 g/dL (ref 30.0–36.0)
MCV: 84.5 fl (ref 78.0–100.0)
Platelets: 146 10*3/uL — ABNORMAL LOW (ref 150.0–400.0)
RBC: 3.94 Mil/uL — ABNORMAL LOW (ref 4.22–5.81)
RDW: 14.1 % (ref 11.5–15.5)
WBC: 6 10*3/uL (ref 4.0–10.5)

## 2020-08-13 LAB — COMPREHENSIVE METABOLIC PANEL
ALT: 9 U/L (ref 0–53)
AST: 16 U/L (ref 0–37)
Albumin: 4.2 g/dL (ref 3.5–5.2)
Alkaline Phosphatase: 24 U/L — ABNORMAL LOW (ref 39–117)
BUN: 17 mg/dL (ref 6–23)
CO2: 26 mEq/L (ref 19–32)
Calcium: 9.2 mg/dL (ref 8.4–10.5)
Chloride: 103 mEq/L (ref 96–112)
Creatinine, Ser: 1.19 mg/dL (ref 0.40–1.50)
GFR: 58.28 mL/min — ABNORMAL LOW (ref >60.00)
Glucose, Bld: 88 mg/dL (ref 70–99)
Potassium: 4.1 mEq/L (ref 3.5–5.1)
Sodium: 136 mEq/L (ref 135–145)
Total Bilirubin: 0.5 mg/dL (ref 0.2–1.2)
Total Protein: 7.1 g/dL (ref 6.0–8.3)

## 2020-08-13 LAB — LIPID PANEL
Cholesterol: 183 mg/dL (ref 0–200)
HDL: 65.6 mg/dL (ref >39.00)
LDL Cholesterol: 105 mg/dL — ABNORMAL HIGH (ref 0–99)
NonHDL: 117.27
Total CHOL/HDL Ratio: 3
Triglycerides: 60 mg/dL (ref 0.0–149.0)
VLDL: 12 mg/dL (ref 0.0–40.0)

## 2020-08-13 NOTE — Patient Instructions (Addendum)
Start taking the constipation medicine every day for 1-2 weeks and then call us and let us know how it is doing.   We are checking the labs today.

## 2020-08-13 NOTE — Progress Notes (Signed)
Subjective:   Patient ID: Evan Escobar, male    DOB: [birth year], 80 y.o.   MRN: [id number]  HPI Here for medicare wellness and physical, no new complaints. Please see A/P for status and treatment of chronic medical problems.   Diet: heart healthy Physical activity: sedentary Depression/mood screen: negative Hearing: mild to moderate loss bilaterally, has hearing aid right does not wear Visual acuity: glasses, left eye poor vision, right eye better, performs annual eye exam  ADLs: capable Fall risk: none Home safety: good Cognitive evaluation: intact to orientation, naming, recall and repetition EOL planning: adv directives discussed  [place] Visit from 08/13/2020 in [place] at [facility] Total Score 0       I have personally reviewed and have noted 1. The patient's medical and social history - reviewed today no changes 2. Their use of alcohol, tobacco or illicit drugs 3. Their current medications and supplements 4. The patient's functional ability including ADL's, fall risks, home safety risks and hearing or visual impairment. 5. Diet and physical activities 6. Evidence for depression or mood disorders 7. Care team reviewed and updated 8.  The patient is not on an opioid pain medication.  Patient Care Team: [name], MD as PCP - General (Internal Medicine) Marcial Pacas, MD as Consulting Physician (Neurology) Regal, Tamala Fothergill, DPM as Consulting Physician (Podiatry) Charlton Haws, [facility] as Pharmacist (Pharmacist) Past Medical History:  Diagnosis Date   Allergy    Arthritis    Cataract    Glaucoma    Hypertension    Normocytic anemia 09/25/2012   Polyp, sigmoid colon 11/05/2011   Seizure ([place])    Seizures ([place]) 03/17/2013   Past Surgical History:  Procedure Laterality Date   ANTERIOR CERVICAL DECOMP/DISCECTOMY FUSION N/A 05/22/2017   Procedure: Anterior Cervical Discectomy Fusion - Cervical Three-Cervical Four - Cervical  Four-Cervical Five;  Surgeon: Kary Kos, MD;  Location: [facility];  Service: Neurosurgery;  Laterality: N/A;   CATARACT EXTRACTION, BILATERAL     EYE SURGERY     GLAUCOMA REPAIR  10 yrs ago   Family History  Problem Relation Age of Onset   Stroke Mother    High blood pressure Mother    Stroke Father    High blood pressure Father    Colon cancer Brother    Esophageal cancer Neg Hx    Pancreatic cancer Neg Hx    Rectal cancer Neg Hx    Stomach cancer Neg Hx     Review of Systems  Constitutional: Negative.   HENT: Negative.    Eyes: Negative.   Respiratory:  Negative for cough, chest tightness and shortness of breath.   Cardiovascular:  Negative for chest pain, palpitations and leg swelling.  Gastrointestinal:  Positive for constipation. Negative for abdominal distention, abdominal pain, diarrhea, nausea and vomiting.  Musculoskeletal:  Positive for arthralgias and back pain.  Skin: Negative.   Neurological: Negative.   Psychiatric/Behavioral: Negative.     Objective:  Physical Exam Constitutional:      Appearance: He is well-developed.  HENT:     Head: Normocephalic and atraumatic.  Cardiovascular:     Rate and Rhythm: Normal rate and regular rhythm.  Pulmonary:     Effort: Pulmonary effort is normal. No respiratory distress.     Breath sounds: Normal breath sounds. No wheezing or rales.  Abdominal:     General: Bowel sounds are normal. There is no distension.     Palpations: Abdomen is soft.  Tenderness: There is no abdominal tenderness. There is no rebound.  Musculoskeletal:        General: Tenderness present.     Cervical back: Normal range of motion.  Skin:    General: Skin is warm and dry.  Neurological:     Mental Status: He is alert and oriented to person, place, and time.     Coordination: Coordination abnormal.     Comments: Slow gait    Vitals:   08/13/20 0959  BP: 124/68  Pulse: (!) 59  Resp: 18  Temp: 98 F (36.7 C)  TempSrc: Oral  SpO2: 99%   Weight: 143 lb 6.4 oz (65 kg)  Height: 5\' 7"  (1.702 m)   This visit occurred during the SARS-CoV-2 public health emergency.  Safety protocols were in place, including screening questions prior to the visit, additional usage of staff PPE, and extensive cleaning of exam room while observing appropriate contact time as indicated for disinfecting solutions.   Assessment & Plan:

## 2020-08-16 ENCOUNTER — Encounter: Payer: Self-pay | Admitting: Internal Medicine

## 2020-08-16 NOTE — Assessment & Plan Note (Signed)
Flu shot yearly. Covid-19 2 shots he states booster done but card not with him. Pneumonia complete. Shingrix counseled to get at pharmacy. Tetanus due 2026. Colonoscopy aged out future. Counseled about sun safety and mole surveillance. Counseled about the dangers of distracted driving. Given 10 year screening recommendations.

## 2020-08-16 NOTE — Assessment & Plan Note (Signed)
BP at goal on lisinopril 20 mg daily. Checking CMP and adjust as needed.

## 2020-08-16 NOTE — Assessment & Plan Note (Signed)
Still having. Was prescribed linzess and did not take regularly. Asked him to take this daily for 1-2 weeks and then let us know if effective.

## 2020-08-16 NOTE — Assessment & Plan Note (Signed)
Taking flomax. Controlling symptoms adequately at this time.

## 2020-08-16 NOTE — Assessment & Plan Note (Signed)
Checking CBC today and adjust as needed.

## 2020-08-16 NOTE — Assessment & Plan Note (Signed)
Seeing neurology and no recurrent seizures. Taking keppra 500 mg BID.

## 2020-08-27 ENCOUNTER — Ambulatory Visit (INDEPENDENT_AMBULATORY_CARE_PROVIDER_SITE_OTHER): Payer: Medicare Other | Admitting: Pharmacist

## 2020-08-27 ENCOUNTER — Telehealth: Payer: Self-pay | Admitting: Pharmacist

## 2020-08-27 ENCOUNTER — Other Ambulatory Visit: Payer: Self-pay

## 2020-08-27 DIAGNOSIS — H409 Unspecified glaucoma: Secondary | ICD-10-CM | POA: Diagnosis not present

## 2020-08-27 DIAGNOSIS — K5904 Chronic idiopathic constipation: Secondary | ICD-10-CM

## 2020-08-27 DIAGNOSIS — I10 Essential (primary) hypertension: Secondary | ICD-10-CM | POA: Diagnosis not present

## 2020-08-27 DIAGNOSIS — R569 Unspecified convulsions: Secondary | ICD-10-CM

## 2020-08-27 NOTE — Telephone Encounter (Signed)
  Chronic Care Management   Outreach Note  08/27/2020 Name: Evan Escobar MRN: [id number] DOB: 02/24/1940  Referred by: [name], MD  Patient had a phone appointment scheduled with clinical pharmacist today.  An unsuccessful telephone outreach was attempted today. The patient was referred to the pharmacist for assistance with medications, care management and care coordination.   Patient will NOT be penalized in any way for missing a CCM appointment. The no-show fee does not apply.  If possible, a message was left to return call to: [phone] or to [facility]: [place], PharmD, Para March, CPP Clinical Pharmacist [facility] at [facility] [phone]

## 2020-08-27 NOTE — Progress Notes (Signed)
Chronic Care Management Pharmacy Note  08/29/2020 Name:  Evan Escobar MRN:  [id number] DOB:  1940/06/21  Summary: -Pt tried Linzess for 2 weeks as PCP suggested and reports it didn't help. He never tried stool softener. He has seen GI for constipation issues before  Recommendations/Changes made from today's visit: -Counseled on hydration and fiber for constipation -Recommended Colace 200 mg/day plus Linzess 145 mcg daily. Advised to schedule appt with GI if constipation does not improve   Subjective: Evan Escobar is an 80 y.o. year old male who is a primary patient of [name], MD.  The CCM team was consulted for assistance with disease management and care coordination needs.    Engaged with patient by telephone for follow up visit in response to provider referral for pharmacy case management and/or care coordination services.   Consent to Services:  The patient was given information about Chronic Care Management services, agreed to services, and gave verbal consent prior to initiation of services.  Please see initial visit note for detailed documentation.   Patient Care Team: [name], MD as PCP - General (Internal Medicine) Marcial Pacas, MD as Consulting Physician (Neurology) Paulla Dolly Tamala Fothergill, DPM as Consulting Physician (Podiatry) Charlton Haws, [facility] as Pharmacist (Pharmacist)   Patient lives at home with wife. 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   Recent office visits: 08/13/20 Dr Sharlet Salina OV: AWV; advised to take Linzess daily x 1-2 weeks and let us know if effective;   05/30/20 Dr Jenny Reichmann OV: acute otitis externa; rx'd Neomycin-polymyxin 03/09/20 Dr Sharlet Salina VV: cough, rx'd benonatate, promethazine-DM 02/06/20 Dr Sharlet Salina OV: f/u back pain, BP, arm pain. Increased lisinopril to 20 mg for BP 154/84. Rx'd prednisone for pain.  Recent consult visits: 06/09/20 Urgent care: hearing loss  d/t cerumen impaction 01/28/20 Urgent Care: bilateral sciatica. Rx'd tizanidine  01/05/20 Dr Venetia Maxon (ophthalmology): f/u glaucoma  12/27/19 NP Chester Holstein (GI): chronic constipation - using MOM twice a week. Advised minimum 48 oz water daily. Add benefiber BID. Consider Linzess again in future - pt did not give it adequate trial.  11/28/19 NP Butler Denmark (neurology): f/u seizures. Last seizure 2014 (only seizure- Hx cocaine use). Decided to continue Keppra for now, may consider stopping in future.  Hospital visits: None in previous 6 months  Objective:  Lab Results  Component Value Date   CREATININE 1.19 08/13/2020   BUN 17 08/13/2020   GFR 58.28 (L) 08/13/2020   GFRNONAA 53 (L) 10/13/2019   GFRAA 62 10/13/2019   NA 136 08/13/2020   K 4.1 08/13/2020   CALCIUM 9.2 08/13/2020   CO2 26 08/13/2020   GLUCOSE 88 08/13/2020    Lab Results  Component Value Date/Time   HGBA1C 5.8 01/10/2019 08:51 AM   GFR 58.28 (L) 08/13/2020 10:24 AM   GFR 67.60 07/14/2019 08:48 AM    Last diabetic Eye exam: No results found for: HMDIABEYEEXA  Last diabetic Foot exam: No results found for: HMDIABFOOTEX   Lab Results  Component Value Date   CHOL 183 08/13/2020   HDL 65.60 08/13/2020   LDLCALC 105 (H) 08/13/2020   TRIG 60.0 08/13/2020   CHOLHDL 3 08/13/2020    Hepatic Function Latest Ref Rng & Units 08/13/2020 10/13/2019 07/14/2019  Total Protein 6.0 - 8.3 g/dL 7.1 7.1 6.8  Albumin 3.5 - 5.2 g/dL 4.2 - 4.1  AST 0 - 37 U/L '16 15 17  ' ALT 0 - 53 U/L 9 7(L) 9  Alk Phosphatase 39 - 117 U/L 24(L) - 27(L)  Total Bilirubin 0.2 - 1.2 mg/dL 0.5 0.4 0.6  Bilirubin, Direct 0.0 - 0.2 mg/dL - 0.1 -    Lab Results  Component Value Date/Time   TSH 0.96 10/13/2019 03:32 PM   TSH 0.86 01/10/2019 08:51 AM   FREET4 0.95 01/10/2019 08:51 AM    CBC Latest Ref Rng & Units 08/13/2020 10/13/2019 07/14/2019  WBC 4.0 - 10.5 K/uL 6.0 4.1 4.2  Hemoglobin 13.0 - 17.0 g/dL 11.1(L) 11.7(L) 11.8(L)  Hematocrit 39.0 -  52.0 % 33.3(L) 35.8(L) 35.6(L)  Platelets 150.0 - 400.0 K/uL 146.0(L) 159 160.0    Lab Results  Component Value Date/Time   VD25OH 47.89 01/10/2019 08:51 AM    Clinical ASCVD: No  The 10-year ASCVD risk score Mikey Bussing DC Jr., et al., 2013) is: 21.3%   Values used to calculate the score:     Age: 10 years     Sex: Male     Is Non-Hispanic African American: Yes     Diabetic: No     Tobacco smoker: No     Systolic Blood Pressure: 756 mmHg     Is BP treated: Yes     HDL Cholesterol: 65.6 mg/dL     Total Cholesterol: 183 mg/dL    Depression screen [facility] 2/9 08/13/2020 07/14/2019 07/19/2018  Decreased Interest 0 0 0  Down, Depressed, Hopeless 0 0 0  PHQ - 2 Score 0 0 0     Social History   Tobacco Use  Smoking Status Former   Packs/day: 1.00   Pack years: 0.00   Types: Cigarettes   Quit date: 02/18/2007   Years since quitting: 13.5  Smokeless Tobacco Never  Tobacco Comments   not sure but a while ago; quit smoking and ETOH   BP Readings from Last 3 Encounters:  08/13/20 124/68  06/09/20 134/61  05/30/20 130/76   Pulse Readings from Last 3 Encounters:  08/13/20 (!) 59  06/09/20 (!) 58  05/30/20 (!) 57   Wt Readings from Last 3 Encounters:  08/13/20 143 lb 6.4 oz (65 kg)  05/30/20 147 lb (66.7 kg)  02/06/20 145 lb 3.2 oz (65.9 kg)   BMI Readings from Last 3 Encounters:  08/13/20 22.46 kg/m  05/30/20 23.02 kg/m  02/06/20 22.74 kg/m    Assessment/Interventions: Review of patient past medical history, allergies, medications, health status, including review of consultants reports, laboratory and other test data, was performed as part of comprehensive evaluation and provision of chronic care management services.   SDOH:  (Social Determinants of Health) assessments and interventions performed: Yes   SDOH Screenings   Alcohol Screen: Not on file  Depression (PHQ2-9): Low Risk    PHQ-2 Score: 0  Financial Resource Strain: Low Risk    Difficulty of Paying Living  Expenses: Not hard at all  Food Insecurity: Not on file  Housing: Not on file  Physical Activity: Not on file  Social Connections: Not on file  Stress: Not on file  Tobacco Use: Medium Risk   Smoking Tobacco Use: Former   Smokeless Tobacco Use: Never  Transportation Needs: Not on file    [place]  No Known Allergies  Medications Reviewed Today     Reviewed by Charlton Haws, [place] (Pharmacist) on 08/28/20 at 1001  Med List Status: <None>   Medication Order Taking? Sig Documenting Provider Last Dose Status Informant  acetaminophen (TYLENOL) 500 MG tablet [id number] Yes Take 500 mg by mouth every 6 (six)  hours as needed for moderate pain or headache.  [provider] Taking Active Self  aspirin 81 MG tablet [id number] Yes Take 81 mg by mouth daily. [provider] Taking Active Self  brimonidine (ALPHAGAN P) 0.1 % SOLN [id number] Yes Place 1 drop 3 (three) times daily into both eyes. [provider] Taking Active Self  cycloSPORINE, PF, (CEQUA) 0.09 % SOLN [id number] Yes Apply 1 drop to eye 2 (two) times daily. [provider] Taking Active   docusate sodium (COLACE) 100 MG capsule [id number] Yes Take 200 mg by mouth 2 (two) times daily. [provider]  Active   latanoprost (XALATAN) 0.005 % ophthalmic solution [id number] Yes Place 1 drop into both eyes at bedtime.  [provider] Taking Active Self  levETIRAcetam (KEPPRA) 500 MG tablet [id number] Yes Take 1 tablet (500 mg total) by mouth 2 (two) times daily. Suzzanne Cloud, NP Taking Active   linaclotide Rolan Lipa) 145 MCG CAPS capsule [id number] No Take 145 mcg by mouth daily before breakfast.  Patient not taking: Reported on 08/28/2020   [provider] Not Taking Active   lisinopril (ZESTRIL) 20 MG tablet [id number] Yes Take 1 tablet (20 mg total) by mouth daily. [name], MD Taking Active   magnesium hydroxide (MILK OF MAGNESIA) 400 MG/5ML suspension  [id number] Yes Take 30 mLs by mouth as needed for mild constipation. [provider] Taking Active   methocarbamol (ROBAXIN) 500 MG tablet [id number] Yes Take by mouth. [provider] Taking Active   Multiple Vitamin (MULTIVITAMIN) tablet [id number] Yes Take 1 tablet by mouth daily. [provider] Taking Active Self  tamsulosin (FLOMAX) 0.4 MG CAPS capsule [id number] Yes TAKE 1 CAPSULE(0.4 MG) BY MOUTH DAILY [name], MD Taking Active             Patient Active Problem List   Diagnosis Date Noted   Deficiency anemia 10/13/2019   Cough 10/13/2019   Chronic idiopathic constipation 10/13/2019   Cervical radiculopathy 01/10/2019   Eye problem 01/10/2019   Left lumbar radiculopathy 12/09/2017   Hearing loss 09/09/2016   Benign prostatic hyperplasia 08/28/2015   Routine general medical examination at a health care facility 08/28/2015   Allergic rhinitis 02/26/2015   Glaucoma 05/07/2013   Essential hypertension, benign 05/07/2013   Seizures ([place]) 03/17/2013    Immunization History  Administered Date(s) Administered   Fluad Quad(high Dose 65+) 12/17/2018, 11/15/2019   Influenza, High Dose Seasonal PF 11/17/2012, 11/18/2016, 12/09/2017   Influenza,inj,Quad PF,6+ Mos 11/14/2013, 01/01/2016   Influenza-Unspecified 01/18/2015, 01/04/2017, 12/02/2018   PFIZER(Purple Top)SARS-COV-2 Vaccination 04/15/2019, 05/10/2019   Pneumococcal Conjugate-13 04/16/2015   Pneumococcal Polysaccharide-23 11/15/2019   Pneumococcal-Unspecified 02/17/2010   Tdap 08/17/2014    Conditions to be addressed/monitored:  Hypertension, Hyperlipidemia, Osteoarthritis, BPH and Constipation , Glaucoma  Care Plan : [place]  Updates made by Charlton Haws, [place] since 08/29/2020 12:00 AM     Problem: Hypertension, Hyperlipidemia, Osteoarthritis, BPH and Constipation, Glaucoma   Priority: High     Long-Range Goal: Disease management   Start Date: 05/30/2020   Expected End Date: 11/29/2020  This Visit's Progress: On track  Recent Progress: On track  Priority: High  Note:   Current Barriers:  Unable to independently monitor therapeutic efficacy Unable to achieve control of constipation   Pharmacist Clinical Goal(s):  Patient will achieve adherence to monitoring guidelines and medication adherence to achieve therapeutic efficacy achieve control of arthritis, constipation as evidenced by patient report through collaboration with PharmD and  provider.   Interventions: 1:1 collaboration with [name], MD regarding development and update of comprehensive plan of care as evidenced by provider attestation and co-signature Inter-disciplinary care team collaboration (see longitudinal plan of care) Comprehensive medication review performed; medication list updated in electronic medical record  Hypertension (BP goal <130/80) -Controlled - BP low-normal at home per patient; he denies side effects, dizziness, lightheadedness -Current treatment: Lisinopril 20 mg daily -Current home readings: 104/55 -Counseled to monitor BP at home daily, document, and provide log at future appointments -Recommended to continue current medication  BPH (Goal: reduce urinary symptoms) -Improving - pt reports frequent nocturia has improved somewhat when he avoids drinking water later in the evening -Current treatment  Tamsulosin 0.4 mg daily -Medications previously tried: solifenacin -Counseled on BPH, indication and benefits of tamsulosin;  -Recommended to avoid fluid intake after 6pm  Glaucoma (Goal: maintain IOP) -Not ideally controlled - pt reports frequent dry eye and blurred vision; he wishes to stop getting injections from ophthalomology, he feels they do not help; he reports artificial tears work best for his ysmptoms -Current treatment  Brimonidine (Alphagan) 0.1% eye drops TID Cequa (cyclosporne) 0.09% eye drops BID Latanoprost 0.005% eye drops  HS Artificial tears -Advised to follow up with eye doctor as scheduled -Recommended to use Artificial Tears as often as needed  Chronic constipation (Goal: improve symptoms) -Not ideally controlled - pt reports constipation still comes and goes, there has been some improvement since starting Benefiber but still suffers constipation most days; pt reports he took Linzess for 2 weeks as directed by PCP and it was not helpful; he never started docusate as suggested previously by PharmD -Pt has seen GI previously for similar issues; he was meant to follow up in Fall 2021 but never made appt -Current treatment  Benefiber TID Milk of magnesia Linzess 145 mcg - stopped after 2 weeks -Medications previously tried: Linzess -Recommended to start colace 100 mg - 2 tab daily along with resuming Linzess daily.  -Advised to contact GI to schedule f/u appt if constipation does not improve in ~2 weeks. Gave phone number for GI.  Patient Goals/Self-Care Activities Patient will:  - take medications as prescribed -focus on medication adherence by routine -check blood pressure daily, document, and provide at future appointments -Try Colace stool softener 2 capsules daily along with Linzess daily -Contact GI to schedule appt if constipation does not improve after 2 weeks (phone: [phone]) -Avoid fluids after 6 pm       Medication Assistance: None required.  Patient affirms current coverage meets needs.  Compliance/Adherence/Medication fill history: Care Gaps: Hepatitis C screening Shingrix vaccine Covid booster (due 10/10/19)  Star-Rating Drugs: Lisinopril - LF 06/16/20 x 90 ds  Patient's preferred pharmacy is:  [facility] [place], [place] [address] AT [address] [place] [address] Phone: [phone] Fax: [phone]  Uses pill box? No - prefers bottles Pt endorses 100% compliance  We discussed: Current pharmacy is  preferred with insurance plan and patient is satisfied with pharmacy services.  Patient decided to: Continue current medication management strategy  Care Plan and Follow Up Patient Decision:  Patient agrees to Care Plan and Follow-up.  Plan: Telephone follow up appointment with care management team member scheduled for:  3 months  Charlene Brooke, PharmD, [place], CPP Clinical Pharmacist [facility] at [facility] [phone]

## 2020-08-29 NOTE — Patient Instructions (Signed)
Visit Information  Phone number for Pharmacist: [phone]   Goals Addressed             This Visit's Progress    Track and Manage My Blood Pressure-Hypertension       Timeframe:  Long-Range Goal Priority:  High Start Date:        05/30/20                     Expected End Date:    12/16/20                   Follow Up Date Oct 2022   - check blood pressure daily - write blood pressure results in a log or diary  -Drink at least 48 oz of water daily to maintain BP and help with constipation -Start Colace 200 mg/day along with Linzess and Benefiber for constipation   Why is this important?   You won't feel high blood pressure, but it can still hurt your blood vessels.  High blood pressure can cause heart or kidney problems. It can also cause a stroke.  Making lifestyle changes like losing a little weight or eating less salt will help.  Checking your blood pressure at home and at different times of the day can help to control blood pressure.  If the doctor prescribes medicine remember to take it the way the doctor ordered.  Call the office if you cannot afford the medicine or if there are questions about it.     Notes:          Patient verbalizes understanding of instructions provided today and agrees to view in [place].  Telephone follow up appointment with pharmacy team member scheduled for: 3 months  Charlene Brooke, PharmD, [place], CPP Clinical Pharmacist [facility] at [facility] : [facility] [phone]

## 2020-09-03 ENCOUNTER — Encounter (HOSPITAL_COMMUNITY): Payer: Self-pay

## 2020-09-03 ENCOUNTER — Other Ambulatory Visit: Payer: Self-pay

## 2020-09-03 ENCOUNTER — Ambulatory Visit (HOSPITAL_COMMUNITY)
Admission: EM | Admit: 2020-09-03 | Discharge: 2020-09-03 | Disposition: A | Discharge status: Home / Self Care | Payer: Medicare Other | Attending: Urgent Care | Admitting: Urgent Care

## 2020-09-03 DIAGNOSIS — I10 Essential (primary) hypertension: Secondary | ICD-10-CM

## 2020-09-03 DIAGNOSIS — L299 Pruritus, unspecified: Secondary | ICD-10-CM

## 2020-09-03 DIAGNOSIS — L249 Irritant contact dermatitis, unspecified cause: Secondary | ICD-10-CM | POA: Diagnosis not present

## 2020-09-03 DIAGNOSIS — H6981 Other specified disorders of Eustachian tube, right ear: Secondary | ICD-10-CM

## 2020-09-03 MED ORDER — CETIRIZINE HCL 5 MG PO TABS: 5.0000 mg | ORAL_TABLET | Freq: Every day | ORAL | 0 refills | Status: DC

## 2020-09-03 MED ORDER — FLUTICASONE PROPIONATE 50 MCG/ACT NA SUSP: 2.0000 | Freq: Every day | NASAL | 12 refills | Status: DC

## 2020-09-03 MED ORDER — TRIAMCINOLONE ACETONIDE 0.1 % EX CREA: 1.0000 "application " | TOPICAL_CREAM | Freq: Two times a day (BID) | CUTANEOUS | 0 refills | Status: DC

## 2020-09-03 NOTE — ED Triage Notes (Signed)
Pt presents with rash on left arm and right ear fullness since yesterday.

## 2020-09-03 NOTE — ED Provider Notes (Signed)
[place]   MRN: [id number] DOB: 1941-01-20  Subjective:   Evan Escobar is a 80 y.o. male presenting for 1 day history of right ear fullness. Denies fever, ear drainage, tinnitus, dizziness, vertigo. Has also had itchy spots on his arms. Denies tenderness, drainage of pus or bleeding. Has not used medications for relief.   No current facility-administered medications for this encounter.  Current Outpatient Medications:    acetaminophen (TYLENOL) 500 MG tablet, Take 500 mg by mouth every 6 (six) hours as needed for moderate pain or headache. , Disp: , Rfl:    aspirin 81 MG tablet, Take 81 mg by mouth daily., Disp: , Rfl:    brimonidine (ALPHAGAN P) 0.1 % SOLN, Place 1 drop 3 (three) times daily into both eyes., Disp: , Rfl:    cycloSPORINE, PF, (CEQUA) 0.09 % SOLN, Apply 1 drop to eye 2 (two) times daily., Disp: , Rfl:    docusate sodium (COLACE) 100 MG capsule, Take 200 mg by mouth 2 (two) times daily., Disp: , Rfl:    latanoprost (XALATAN) 0.005 % ophthalmic solution, Place 1 drop into both eyes at bedtime. , Disp: , Rfl:    levETIRAcetam (KEPPRA) 500 MG tablet, Take 1 tablet (500 mg total) by mouth 2 (two) times daily., Disp: 180 tablet, Rfl: 4   linaclotide (LINZESS) 145 MCG CAPS capsule, Take 145 mcg by mouth daily before breakfast. (Patient not taking: Reported on 08/28/2020), Disp: , Rfl:    lisinopril (ZESTRIL) 20 MG tablet, Take 1 tablet (20 mg total) by mouth daily., Disp: 90 tablet, Rfl: 3   magnesium hydroxide (MILK OF MAGNESIA) 400 MG/5ML suspension, Take 30 mLs by mouth as needed for mild constipation., Disp: , Rfl:    methocarbamol (ROBAXIN) 500 MG tablet, Take by mouth., Disp: , Rfl:    Multiple Vitamin (MULTIVITAMIN) tablet, Take 1 tablet by mouth daily., Disp: , Rfl:    tamsulosin (FLOMAX) 0.4 MG CAPS capsule, TAKE 1 CAPSULE(0.4 MG) BY MOUTH DAILY, Disp: 90 capsule, Rfl: 0   No Known Allergies  Past Medical History:  Diagnosis Date   Allergy     Arthritis    Cataract    Glaucoma    Hypertension    Normocytic anemia 09/25/2012   Polyp, sigmoid colon 11/05/2011   Seizure ([place])    Seizures ([place]) 03/17/2013     Past Surgical History:  Procedure Laterality Date   ANTERIOR CERVICAL DECOMP/DISCECTOMY FUSION N/A 05/22/2017   Procedure: Anterior Cervical Discectomy Fusion - Cervical Three-Cervical Four - Cervical Four-Cervical Five;  Surgeon: Kary Kos, MD;  Location: [place];  Service: Neurosurgery;  Laterality: N/A;   CATARACT EXTRACTION, BILATERAL     EYE SURGERY     GLAUCOMA REPAIR  10 yrs ago    Family History  Problem Relation Age of Onset   Stroke Mother    High blood pressure Mother    Stroke Father    High blood pressure Father    Colon cancer Brother    Esophageal cancer Neg Hx    Pancreatic cancer Neg Hx    Rectal cancer Neg Hx    Stomach cancer Neg Hx     Social History   Tobacco Use   Smoking status: Former    Packs/day: 1.00    Types: Cigarettes    Quit date: 02/18/2007    Years since quitting: 13.5   Smokeless tobacco: Never   Tobacco comments:    not sure but a while ago; quit smoking and ETOH  Vaping Use   Vaping Use: Never used  Substance Use Topics   Alcohol use: No    Alcohol/week: 0.0 standard drinks    Comment: quit in 2009   Drug use: No    ROS   Objective:   Vitals: BP (!) 150/70 (BP Location: Right Arm)   Pulse 60   Temp 98.2 F (36.8 C) (Oral)   Resp 17   SpO2 99%   Physical Exam Constitutional:      General: He is not in acute distress.    Appearance: Normal appearance. He is well-developed and normal weight. He is not ill-appearing, toxic-appearing or diaphoretic.  HENT:     Head: Normocephalic and atraumatic.     Right Ear: Tympanic membrane, ear canal and external ear normal. There is no impacted cerumen.     Left Ear: Tympanic membrane, ear canal and external ear normal. There is no impacted cerumen.     Nose: Nose normal. No congestion or rhinorrhea.     Mouth/Throat:      Mouth: Mucous membranes are moist.     Pharynx: Oropharynx is clear. No oropharyngeal exudate or posterior oropharyngeal erythema.  Eyes:     General: No scleral icterus.       Right eye: No discharge.        Left eye: No discharge.     Extraocular Movements: Extraocular movements intact.     Conjunctiva/sclera: Conjunctivae normal.     Pupils: Pupils are equal, round, and reactive to light.  Cardiovascular:     Rate and Rhythm: Normal rate.  Pulmonary:     Effort: Pulmonary effort is normal.  Musculoskeletal:     Cervical back: Normal range of motion and neck supple. No rigidity. No muscular tenderness.  Skin:      Neurological:     General: No focal deficit present.     Mental Status: He is alert and oriented to person, place, and time.  Psychiatric:        Mood and Affect: Mood normal.        Behavior: Behavior normal.        Thought Content: Thought content normal.        Judgment: Judgment normal.      Assessment and Plan :   PDMP not reviewed this encounter.  1. Irritant contact dermatitis, unspecified trigger   2. Itching   3. Eustachian tube dysfunction, right   4. Essential hypertension, benign     Start Flonase, cetirizine for management of ETD. Kenalog for suspected contact dermatitis. Counseled patient on potential for adverse effects with medications prescribed/recommended today, ER and return-to-clinic precautions discussed, patient verbalized understanding.    Jaynee Eagles, PA-C 09/03/20 0930

## 2020-09-07 ENCOUNTER — Telehealth: Payer: Self-pay | Admitting: Internal Medicine

## 2020-09-07 MED ORDER — TAMSULOSIN HCL 0.4 MG PO CAPS: ORAL_CAPSULE | ORAL | 0 refills | Status: DC

## 2020-09-07 NOTE — Telephone Encounter (Signed)
Medication has been sent to the patient's pharmacy.  

## 2020-09-07 NOTE — Telephone Encounter (Signed)
   Patient requesting tamsulosin (FLOMAX) 0.4 MG CAPS capsule   Pharmacy [place], [place] AT [facility]

## 2020-10-06 ENCOUNTER — Other Ambulatory Visit: Payer: Self-pay | Admitting: Internal Medicine

## 2020-10-23 DIAGNOSIS — H348312 Tributary (branch) retinal vein occlusion, right eye, stable: Secondary | ICD-10-CM | POA: Diagnosis not present

## 2020-10-23 DIAGNOSIS — H16223 Keratoconjunctivitis sicca, not specified as Sjogren's, bilateral: Secondary | ICD-10-CM | POA: Diagnosis not present

## 2020-10-23 DIAGNOSIS — H401133 Primary open-angle glaucoma, bilateral, severe stage: Secondary | ICD-10-CM | POA: Diagnosis not present

## 2020-10-23 DIAGNOSIS — H348121 Central retinal vein occlusion, left eye, with retinal neovascularization: Secondary | ICD-10-CM | POA: Diagnosis not present

## 2020-10-23 DIAGNOSIS — H3562 Retinal hemorrhage, left eye: Secondary | ICD-10-CM | POA: Diagnosis not present

## 2020-10-26 ENCOUNTER — Telehealth: Payer: Self-pay | Admitting: Internal Medicine

## 2020-10-26 DIAGNOSIS — H9193 Unspecified hearing loss, bilateral: Secondary | ICD-10-CM

## 2020-10-26 DIAGNOSIS — H903 Sensorineural hearing loss, bilateral: Secondary | ICD-10-CM | POA: Diagnosis not present

## 2020-10-26 NOTE — Telephone Encounter (Signed)
See below

## 2020-10-26 NOTE — Telephone Encounter (Signed)
Patient is at [facility] to get a Hearing test. Requesting a referral to be placed so they can be to process the hearing test.   Fax #: [phone]

## 2020-10-26 NOTE — Telephone Encounter (Signed)
Referral placed.

## 2020-10-30 ENCOUNTER — Ambulatory Visit (INDEPENDENT_AMBULATORY_CARE_PROVIDER_SITE_OTHER): Payer: Medicare Other | Admitting: Internal Medicine

## 2020-10-30 ENCOUNTER — Encounter: Payer: Self-pay | Admitting: Internal Medicine

## 2020-10-30 ENCOUNTER — Other Ambulatory Visit: Payer: Self-pay

## 2020-10-30 VITALS — BP 128/74 | HR 53 | Temp 97.8°F | Resp 18 | Ht 67.0 in | Wt 143.0 lb

## 2020-10-30 DIAGNOSIS — Z23 Encounter for immunization: Secondary | ICD-10-CM | POA: Diagnosis not present

## 2020-10-30 DIAGNOSIS — M791 Myalgia, unspecified site: Secondary | ICD-10-CM | POA: Diagnosis not present

## 2020-10-30 DIAGNOSIS — K5904 Chronic idiopathic constipation: Secondary | ICD-10-CM | POA: Diagnosis not present

## 2020-10-30 MED ORDER — LINACLOTIDE 145 MCG PO CAPS: 145.0000 ug | ORAL_CAPSULE | Freq: Every day | ORAL | 3 refills | Status: DC

## 2020-10-30 MED ORDER — METHOCARBAMOL 500 MG PO TABS: 500.0000 mg | ORAL_TABLET | Freq: Every evening | ORAL | 1 refills | Status: DC | PRN

## 2020-10-30 NOTE — Assessment & Plan Note (Signed)
Has tried taking linzess regularly and this is helping. Will continue linzess which is refilled today. Previously he was using this prn and it was not effective. He does use magnesium citrate prn for constipation refractory to this and can still use that otc.

## 2020-10-30 NOTE — Progress Notes (Signed)
   Subjective:   Patient ID: Evan Escobar, male    DOB: 04-11-1940, 80 y.o.   MRN: [id number]  HPI The patient is a 80 YO man coming in for follow up constipation and muscle pain.  Review of Systems  Constitutional: Negative.   HENT: Negative.    Eyes: Negative.   Respiratory:  Negative for cough, chest tightness and shortness of breath.   Cardiovascular:  Negative for chest pain, palpitations and leg swelling.  Gastrointestinal:  Positive for constipation. Negative for abdominal distention, abdominal pain, diarrhea, nausea and vomiting.  Musculoskeletal:  Positive for myalgias.  Skin: Negative.   Neurological: Negative.   Psychiatric/Behavioral: Negative.     Objective:  Physical Exam Constitutional:      Appearance: He is well-developed.  HENT:     Head: Normocephalic and atraumatic.  Cardiovascular:     Rate and Rhythm: Normal rate and regular rhythm.  Pulmonary:     Effort: Pulmonary effort is normal. No respiratory distress.     Breath sounds: Normal breath sounds. No wheezing or rales.  Abdominal:     General: Bowel sounds are normal. There is no distension.     Palpations: Abdomen is soft.     Tenderness: There is no abdominal tenderness. There is no rebound.  Musculoskeletal:     Cervical back: Normal range of motion.  Skin:    General: Skin is warm and dry.  Neurological:     Mental Status: He is alert and oriented to person, place, and time.     Coordination: Coordination normal.    Vitals:   10/30/20 0834  BP: 128/74  Pulse: (!) 53  Resp: 18  Temp: 97.8 F (36.6 C)  TempSrc: Oral  SpO2: 99%  Weight: 143 lb (64.9 kg)  Height: '5\' 7"'$  (1.702 m)    This visit occurred during the SARS-CoV-2 public health emergency.  Safety protocols were in place, including screening questions prior to the visit, additional usage of staff PPE, and extensive cleaning of exam room while observing appropriate contact time as indicated for disinfecting solutions.    Assessment & Plan:

## 2020-10-30 NOTE — Assessment & Plan Note (Signed)
Rx methocarbamol for night time leg cramps. He has tried otc without relief.

## 2020-10-30 NOTE — Patient Instructions (Signed)
We have refilled the linzess to take everyday for the stomach.  We have sent in the muscle medicine to take at night time to see if this helps called robaxin (methocarbamol).

## 2020-11-02 ENCOUNTER — Ambulatory Visit: Payer: Medicare Other | Admitting: Podiatry

## 2020-11-27 ENCOUNTER — Encounter: Payer: Self-pay | Admitting: Neurology

## 2020-11-27 ENCOUNTER — Ambulatory Visit: Payer: Medicare Other | Admitting: Neurology

## 2020-11-27 VITALS — BP 170/73 | HR 58 | Ht 67.0 in | Wt 143.0 lb

## 2020-11-27 DIAGNOSIS — R569 Unspecified convulsions: Secondary | ICD-10-CM

## 2020-11-27 MED ORDER — LEVETIRACETAM 500 MG PO TABS: 500.0000 mg | ORAL_TABLET | Freq: Two times a day (BID) | ORAL | 4 refills | Status: DC

## 2020-11-27 NOTE — Progress Notes (Signed)
ASSESSMENT AND PLAN 80 y.o. year old   Seizure disorder  Last reported seizure was in September 2014  Doing well taking Keppra 500 mg twice a day  Continue refill by his primary care physician  Only return to clinic for new issues    DIAGNOSTIC DATA (LABS, IMAGING, TESTING) - I reviewed patient records, labs, notes, testing and imaging myself where available.  HISTORY OF PRESENT ILLNESS: Evan Escobar is a 80 years old right-handed male, following up for epilepsy,   He was previously evaluated by Dr. Janann Colonel who has left practice, and followed up by [facility] nurse practitioner, he presented with one seizure in September 2014, witnessed by his wife, moaning sound, body tonic-clonic shaking, he woke up on the ambulance.   Per patient, he was drinking daily use cocaine frequently during that period of time, about 1 year prior to his seizure, he had frequent spells of feeling going to pass out, could not respond to surroundings.   I have personally reviewed MRI of brain with without contrast in September 2014 that was normal, EEG was normal in September 2014   He was started on Keppra 500 mg twice a day, he has been compliant with his medications, he is no longer drinking or using illicit drugs, there was no recurrent spells, there is no generalized seizure.   He is driving, per ED record, he suffered a T-bone injury in February 2017, there was no seizure activity described, I personally reviewed repeat CAT scan of the brain that was normal.  Update November 27, 2020 He continue with Keppra 500 mg twice a day, no recurrent seizure, overall doing well, he is no longer drinking alcohol or use street drugs,   PHYSICAL EXAM  Vitals:   11/27/20 1507  BP: (!) 170/73  Pulse: (!) 58  Weight: 143 lb (64.9 kg)  Height: 5\' 7"  (1.702 m)   Body mass index is 22.4 kg/m.   PHYSICAL EXAMNIATION:  Gen: NAD, conversant, well nourised, well groomed        NEUROLOGICAL EXAM:  MENTAL  STATUS: Speech/Cognition: Awake, alert, normal speech, oriented to history taking and casual conversation.  CRANIAL NERVES: CN II: Visual fields are full to confrontation.  Pupils are round equal and briskly reactive to light. CN III, IV, VI: extraocular movement are normal. No ptosis. CN V: Facial sensation is intact to light touch. CN VII: Face is symmetric with normal eye closure and smile. CN VIII: Hearing is normal to casual conversation CN IX, X: Palate elevates symmetrically. Phonation is normal. CN XI: Head turning and shoulder shrug are intact  MOTOR: Muscle bulk and tone are normal. Muscle strength is normal.  REFLEXES: Reflexes are 2  and symmetric at the biceps, triceps, knees and ankles. Plantar responses are flexor.  SENSORY: Intact to light touch, pinprick, positional and vibratory sensation at fingers and toes.  COORDINATION: There is no trunk or limb ataxia.    GAIT/STANCE: He needs push-up to get up from seated position, mildly antalgic  REVIEW OF SYSTEMS: Out of a complete 14 system review of symptoms, the patient complains only of the following symptoms, and all other reviewed systems are negative.  Seizure  ALLERGIES: No Known Allergies  HOME MEDICATIONS: Outpatient Medications Prior to Visit  Medication Sig Dispense Refill   acetaminophen (TYLENOL) 500 MG tablet Take 500 mg by mouth every 6 (six) hours as needed for moderate pain or headache.      aspirin 81 MG tablet Take 81 mg by  mouth daily.     brimonidine (ALPHAGAN P) 0.1 % SOLN Place 1 drop 3 (three) times daily into both eyes.     cetirizine (ZYRTEC) 5 MG tablet Take 1 tablet (5 mg total) by mouth daily. 90 tablet 0   cycloSPORINE, PF, (CEQUA) 0.09 % SOLN Apply 1 drop to eye 2 (two) times daily.     docusate sodium (COLACE) 100 MG capsule Take 200 mg by mouth 2 (two) times daily.     fluticasone (FLONASE) 50 MCG/ACT nasal spray Place 2 sprays into both nostrils daily. 16 g 12   latanoprost  (XALATAN) 0.005 % ophthalmic solution Place 1 drop into both eyes at bedtime.      levETIRAcetam (KEPPRA) 500 MG tablet Take 1 tablet (500 mg total) by mouth 2 (two) times daily. 180 tablet 4   linaclotide (LINZESS) 145 MCG CAPS capsule Take 1 capsule (145 mcg total) by mouth daily before breakfast. 90 capsule 3   lisinopril (ZESTRIL) 20 MG tablet Take 1 tablet (20 mg total) by mouth daily. 90 tablet 3   magnesium hydroxide (MILK OF MAGNESIA) 400 MG/5ML suspension Take 30 mLs by mouth as needed for mild constipation.     methocarbamol (ROBAXIN) 500 MG tablet Take 1 tablet (500 mg total) by mouth at bedtime as needed for muscle spasms. 90 tablet 1   Multiple Vitamin (MULTIVITAMIN) tablet Take 1 tablet by mouth daily.     tamsulosin (FLOMAX) 0.4 MG CAPS capsule TAKE 1 CAPSULE(0.4 MG) BY MOUTH DAILY 90 capsule 0   triamcinolone cream (KENALOG) 0.1 % Apply 1 application topically 2 (two) times daily. 30 g 0   No facility-administered medications prior to visit.    PAST MEDICAL HISTORY: Past Medical History:  Diagnosis Date   Allergy    Arthritis    Cataract    Glaucoma    Hypertension    Normocytic anemia 09/25/2012   Polyp, sigmoid colon 11/05/2011   Seizure ([place])    Seizures ([place]) 03/17/2013    PAST SURGICAL HISTORY: Past Surgical History:  Procedure Laterality Date   ANTERIOR CERVICAL DECOMP/DISCECTOMY FUSION N/A 05/22/2017   Procedure: Anterior Cervical Discectomy Fusion - Cervical Three-Cervical Four - Cervical Four-Cervical Five;  Surgeon: Kary Kos, MD;  Location: [facility];  Service: Neurosurgery;  Laterality: N/A;   CATARACT EXTRACTION, BILATERAL     EYE SURGERY     GLAUCOMA REPAIR  10 yrs ago    FAMILY HISTORY: Family History  Problem Relation Age of Onset   Stroke Mother    High blood pressure Mother    Stroke Father    High blood pressure Father    Colon cancer Brother    Esophageal cancer Neg Hx    Pancreatic cancer Neg Hx    Rectal cancer Neg Hx    Stomach cancer Neg  Hx     SOCIAL HISTORY: Social History   Socioeconomic History   Marital status: Married    Spouse name: Terrence Dupont   Number of children: 1   Years of education: 11   Highest education level: Not on file  Occupational History   Occupation: retired  Tobacco Use   Smoking status: Former    Packs/day: 1.00    Types: Cigarettes    Quit date: 02/18/2007    Years since quitting: 13.7   Smokeless tobacco: Never   Tobacco comments:    not sure but a while ago; quit smoking and ETOH  Vaping Use   Vaping Use: Never used  Substance and Sexual Activity  Alcohol use: No    Alcohol/week: 0.0 standard drinks    Comment: quit in 2009   Drug use: No   Sexual activity: Not Currently  Other Topics Concern   Not on file  Social History Narrative   Patient lives at home with his wife Terrence Dupont). Patient is retired. Patient has 11 th grade education.    Caffeine- one cup of coffee daily and one soda.   Right handed.   Social Determinants of Health   Financial Resource Strain: Low Risk    Difficulty of Paying Living Expenses: Not hard at all  Food Insecurity: Not on file  Transportation Needs: Not on file  Physical Activity: Not on file  Stress: Not on file  Social Connections: Not on file  Intimate Partner Violence: Not on file      Marcial Pacas, M.D. Ph.D.  [facility] [place], [place] [address] Phone: [phone] Fax:      [phone]

## 2020-11-28 ENCOUNTER — Telehealth: Payer: Medicare Other

## 2020-11-28 ENCOUNTER — Telehealth: Payer: Self-pay | Admitting: Pharmacist

## 2020-11-28 NOTE — Progress Notes (Signed)
Opened in error

## 2020-11-29 ENCOUNTER — Ambulatory Visit (INDEPENDENT_AMBULATORY_CARE_PROVIDER_SITE_OTHER): Payer: Medicare Other | Admitting: Pharmacist

## 2020-11-29 ENCOUNTER — Other Ambulatory Visit: Payer: Self-pay

## 2020-11-29 VITALS — BP 124/82

## 2020-11-29 DIAGNOSIS — N401 Enlarged prostate with lower urinary tract symptoms: Secondary | ICD-10-CM

## 2020-11-29 DIAGNOSIS — M791 Myalgia, unspecified site: Secondary | ICD-10-CM

## 2020-11-29 DIAGNOSIS — I10 Essential (primary) hypertension: Secondary | ICD-10-CM

## 2020-11-29 DIAGNOSIS — K5904 Chronic idiopathic constipation: Secondary | ICD-10-CM

## 2020-11-29 NOTE — Patient Instructions (Signed)
Visit Information  Phone number for Pharmacist: [phone]   Goals Addressed             This Visit's Progress    Track and Manage My Blood Pressure-Hypertension       Timeframe:  Long-Range Goal Priority:  High Start Date:        05/30/20                     Expected End Date:    12/16/21               Follow Up Date April 2023   - check blood pressure daily - write blood pressure results in a log or diary  -Drink at least 48 oz of water daily to maintain BP and help with constipation -Start Colace 100 mg/day along with Linzess and Benefiber for constipation   Why is this important?   You won't feel high blood pressure, but it can still hurt your blood vessels.  High blood pressure can cause heart or kidney problems. It can also cause a stroke.  Making lifestyle changes like losing a little weight or eating less salt will help.  Checking your blood pressure at home and at different times of the day can help to control blood pressure.  If the doctor prescribes medicine remember to take it the way the doctor ordered.  Call the office if you cannot afford the medicine or if there are questions about it.     Notes:         Care Plan : [place]  Updates made by Charlton Haws, RPH since 11/29/2020 12:00 AM     Problem: Hypertension, Hyperlipidemia, Osteoarthritis, BPH and Constipation   Priority: High     Long-Range Goal: Disease management   Start Date: 05/30/2020  Expected End Date: 11/29/2021  This Visit's Progress: On track  Recent Progress: On track  Priority: High  Note:   Current Barriers:  Unable to independently monitor therapeutic efficacy Unable to achieve control of constipation   Pharmacist Clinical Goal(s):  Patient will achieve adherence to monitoring guidelines and medication adherence to achieve therapeutic efficacy achieve control of arthritis, constipation as evidenced by patient report through collaboration with PharmD and  provider.   Interventions: 1:1 collaboration with [name], MD regarding development and update of comprehensive plan of care as evidenced by provider attestation and co-signature Inter-disciplinary care team collaboration (see longitudinal plan of care) Comprehensive medication review performed; medication list updated in electronic medical record  Hypertension (BP goal <130/80) -Controlled - BP low-normal at home per patient; he had elevated BP at recent office visit but he reports he went home and checked and his BP was normal again; he notes he was somewhat stressed at the appt -Current home readings: 124/82 -Current treatment: Lisinopril 20 mg daily -Counseled to monitor BP at home daily -Recommended to continue current medication  BPH (Goal: reduce urinary symptoms) -Controlled - pt reports frequent nocturia has improved somewhat when he avoids drinking water later in the evening -Current treatment  Tamsulosin 0.4 mg daily -Medications previously tried: solifenacin -Counseled on BPH, indication and benefits of tamsulosin;  -Recommended to avoid fluid intake after 6pm  Chronic constipation (Goal: improve symptoms) -Improving - pt reports constipation still comes and goes, there has been some improvement since starting daily Linzess -Pt has seen GI previously for similar issues -Current treatment  Benefiber TID Milk of magnesia Linzess 145 mcg - stopped after 2 weeks -  Medications previously tried: n/a -Recommended to start colace 100 mg daily  Leg cramps (Goal: manage symptoms) -Not ideally controlled - pt reports methocarbamol did not help with the cramps; he thinks the cramps are related to his constipation because pain is improved when he is not struggling with constipation -Current treatment  Methocarbamol - not taking -Counseled on hydration, maintaining balanced diet  Patient Goals/Self-Care Activities Patient will:  - take medications as  prescribed -focus on medication adherence by routine -check blood pressure daily, document, and provide at future appointments -Try Colace stool softener       Patient verbalizes understanding of instructions provided today and agrees to view in [place].  Telephone follow up appointment with pharmacy team member scheduled for: 6 months  Charlene Brooke, PharmD, [place], CPP Clinical Pharmacist [facility] [phone]

## 2020-11-29 NOTE — Progress Notes (Signed)
Chronic Care Management Pharmacy Note  11/29/2020 Name:  Evan Escobar MRN:  [id number] DOB:  1940-07-24  Summary: -Pt reports constipation improved somewhat with daily Linzess, still has problems every few days. He has not tried a stool softener. -Pt stopped taking methocarbamol since it did not help with leg cramps  Recommendations/Changes made from today's visit: -Counseled on hydration and fiber for constipation -Recommended Colace 100 mg/day -Removed methocarbamol from med list   Subjective: Evan Escobar is an 80 y.o. year old male who is a primary patient of Evan Koch, MD.  The CCM team was consulted for assistance with disease management and care coordination needs.    Engaged with patient by telephone for follow up visit in response to provider referral for pharmacy case management and/or care coordination services.   Consent to Services:  The patient was given information about Chronic Care Management services, agreed to services, and gave verbal consent prior to initiation of services.  Please see initial visit note for detailed documentation.   Patient Care Team: Evan Koch, MD as PCP - General (Internal Medicine) Evan Pacas, MD as Consulting Physician (Neurology) Evan Escobar, DPM as Consulting Physician (Podiatry) Evan Escobar, [facility] as Pharmacist (Pharmacist)   Patient lives at home with wife. 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   Recent office visits: 10/30/20 Dr Evan Escobar OV: c/o constipation, muscle pain. Rx'd methocarbamol for leg cramps. Refilled Linzess for daily use.  08/13/20 Dr Evan Escobar OV: AWV; advised to take Linzess daily x 1-2 weeks and let Evan Escobar know if effective;   05/30/20 Dr Evan Escobar OV: acute otitis externa; rx'd Neomycin-polymyxin 03/09/20 Dr Evan Escobar VV: cough, rx'd benonatate, promethazine-DM 02/06/20 Dr Evan Escobar OV: f/u back pain, BP, arm pain. Increased  lisinopril to 20 mg for BP 154/84. Rx'd prednisone for pain.  Recent consult visits: 11/27/20 Dr Evan Escobar (Neurology): last seizure 2014. Continue Keppra refills per PCP. Return PRN  09/03/20 urgent care: contact dermatitis (ear): rx'd cetirizine, Flonase, and triamcinolone cream  06/09/20 Urgent care: hearing loss d/t cerumen impaction 01/28/20 Urgent Care: bilateral sciatica. Rx'd tizanidine  01/05/20 Dr Evan Escobar (ophthalmology): f/u glaucoma  12/27/19 NP Evan Escobar (GI): chronic constipation - using MOM twice a week. Advised minimum 48 oz water daily. Add benefiber BID. Consider Linzess again in future - pt did not give it adequate trial.  11/28/19 NP Evan Escobar (neurology): f/u seizures. Last seizure 2014 (only seizure- Hx cocaine use). Decided to continue Keppra for now, may consider stopping in future.  Hospital visits: None in previous 6 months  Objective:  Lab Results  Component Value Date   CREATININE 1.19 08/13/2020   BUN 17 08/13/2020   GFR 58.28 (L) 08/13/2020   GFRNONAA 53 (L) 10/13/2019   GFRAA 62 10/13/2019   NA 136 08/13/2020   K 4.1 08/13/2020   CALCIUM 9.2 08/13/2020   CO2 26 08/13/2020   GLUCOSE 88 08/13/2020    Lab Results  Component Value Date/Time   HGBA1C 5.8 01/10/2019 08:51 AM   GFR 58.28 (L) 08/13/2020 10:24 AM   GFR 67.60 07/14/2019 08:48 AM    Last diabetic Eye exam: No results found for: HMDIABEYEEXA  Last diabetic Foot exam: No results found for: HMDIABFOOTEX   Lab Results  Component Value Date   CHOL 183 08/13/2020   HDL 65.60 08/13/2020   LDLCALC 105 (H) 08/13/2020   TRIG 60.0 08/13/2020   CHOLHDL 3 08/13/2020    Hepatic Function Latest Ref Rng &  Units 08/13/2020 10/13/2019 07/14/2019  Total Protein 6.0 - 8.3 g/dL 7.1 7.1 6.8  Albumin 3.5 - 5.2 g/dL 4.2 - 4.1  AST 0 - 37 U/L '16 15 17  ' ALT 0 - 53 U/L 9 7(L) 9  Alk Phosphatase 39 - 117 U/L 24(L) - 27(L)  Total Bilirubin 0.2 - 1.2 mg/dL 0.5 0.4 0.6  Bilirubin, Direct 0.0 - 0.2 mg/dL - 0.1 -     Lab Results  Component Value Date/Time   TSH 0.96 10/13/2019 03:32 PM   TSH 0.86 01/10/2019 08:51 AM   FREET4 0.95 01/10/2019 08:51 AM    CBC Latest Ref Rng & Units 08/13/2020 10/13/2019 07/14/2019  WBC 4.0 - 10.5 K/uL 6.0 4.1 4.2  Hemoglobin 13.0 - 17.0 g/dL 11.1(L) 11.7(L) 11.8(L)  Hematocrit 39.0 - 52.0 % 33.3(L) 35.8(L) 35.6(L)  Platelets 150.0 - 400.0 K/uL 146.0(L) 159 160.0    Lab Results  Component Value Date/Time   VD25OH 47.89 01/10/2019 08:51 AM    Clinical ASCVD: No  The 10-year ASCVD risk score (Arnett DK, et al., 2019) is: 21.3%   Values used to calculate the score:     Age: 59 years     Sex: Male     Is Non-Hispanic African American: Yes     Diabetic: No     Tobacco smoker: No     Systolic Blood Pressure: 158 mmHg     Is BP treated: Yes     HDL Cholesterol: 65.6 mg/dL     Total Cholesterol: 183 mg/dL    Depression screen [facility] 2/9 08/13/2020 07/14/2019 07/19/2018  Decreased Interest 0 0 0  Down, Depressed, Hopeless 0 0 0  PHQ - 2 Score 0 0 0     Social History   Tobacco Use  Smoking Status Former   Packs/day: 1.00   Types: Cigarettes   Quit date: 02/18/2007   Years since quitting: 13.7  Smokeless Tobacco Never  Tobacco Comments   not sure but a while ago; quit smoking and ETOH   BP Readings from Last 3 Encounters:  11/29/20 124/82  11/27/20 (!) 170/73  10/30/20 128/74   Pulse Readings from Last 3 Encounters:  11/27/20 (!) 58  10/30/20 (!) 53  09/03/20 60   Wt Readings from Last 3 Encounters:  11/27/20 143 lb (64.9 kg)  10/30/20 143 lb (64.9 kg)  08/13/20 143 lb 6.4 oz (65 kg)   BMI Readings from Last 3 Encounters:  11/27/20 22.40 kg/m  10/30/20 22.40 kg/m  08/13/20 22.46 kg/m    Assessment/Interventions: Review of patient past medical history, allergies, medications, health status, including review of consultants reports, laboratory and other test data, was performed as part of comprehensive evaluation and provision of chronic care  management services.   SDOH:  (Social Determinants of Health) assessments and interventions performed: Yes   SDOH Screenings   Alcohol Screen: Not on file  Depression (PHQ2-9): Low Risk    PHQ-2 Score: 0  Financial Resource Strain: Low Risk    Difficulty of Paying Living Expenses: Not hard at all  Food Insecurity: Not on file  Housing: Not on file  Physical Activity: Not on file  Social Connections: Not on file  Stress: Not on file  Tobacco Use: Medium Risk   Smoking Tobacco Use: Former   Smokeless Tobacco Use: Never  Transportation Needs: Not on file    [place]  No Known Allergies  Medications Reviewed Today     Reviewed by Evan Escobar, [place] (Pharmacist) on 11/29/20 at  [place] List Status: <None>   Medication Order Taking? Sig Documenting Provider Last Dose Status Informant  acetaminophen (TYLENOL) 500 MG tablet [id number] Yes Take 500 mg by mouth every 6 (six) hours as needed for moderate pain or headache.  [provider] Taking Active Self  aspirin 81 MG tablet [id number] Yes Take 81 mg by mouth daily. [provider] Taking Active Self  brimonidine (ALPHAGAN P) 0.1 % SOLN [id number] Yes Place 1 drop 3 (three) times daily into both eyes. [provider] Taking Active Self  cetirizine (ZYRTEC) 5 MG tablet [id number] Yes Take 1 tablet (5 mg total) by mouth daily. Jaynee Eagles, PA-C Taking Active   cycloSPORINE, PF, (CEQUA) 0.09 % SOLN [id number] Yes Apply 1 drop to eye 2 (two) times daily. [provider] Taking Active   docusate sodium (COLACE) 100 MG capsule [id number] Yes Take 200 mg by mouth 2 (two) times daily. [provider] Taking Active   fluticasone (FLONASE) 50 MCG/ACT nasal spray [id number] Yes Place 2 sprays into both nostrils daily. Jaynee Eagles, PA-C Taking Active   latanoprost (XALATAN) 0.005 % ophthalmic solution [id number] Yes Place 1 drop into both eyes at bedtime.  [provider] Taking Active Self   levETIRAcetam (KEPPRA) 500 MG tablet [id number] Yes Take 1 tablet (500 mg total) by mouth 2 (two) times daily. Evan Pacas, MD Taking Active   linaclotide [facility]) 145 MCG CAPS capsule [id number] Yes Take 1 capsule (145 mcg total) by mouth daily before breakfast. Evan Koch, MD Taking Active   lisinopril (ZESTRIL) 20 MG tablet [id number] Yes Take 1 tablet (20 mg total) by mouth daily. Evan Koch, MD Taking Active   magnesium hydroxide (MILK OF MAGNESIA) 400 MG/5ML suspension [id number] Yes Take 30 mLs by mouth as needed for mild constipation. [provider] Taking Active   Multiple Vitamin (MULTIVITAMIN) tablet [id number] Yes Take 1 tablet by mouth daily. [provider] Taking Active Self  tamsulosin (FLOMAX) 0.4 MG CAPS capsule [id number] Yes TAKE 1 CAPSULE(0.4 MG) BY MOUTH DAILY Evan Koch, MD Taking Active             Patient Active Problem List   Diagnosis Date Noted   Deficiency anemia 10/13/2019   Cough 10/13/2019   Chronic idiopathic constipation 10/13/2019   Cervical radiculopathy 01/10/2019   Eye problem 01/10/2019   Left lumbar radiculopathy 12/09/2017   Hearing loss 09/09/2016   Benign prostatic hyperplasia 08/28/2015   Routine general medical examination at a health care facility 08/28/2015   Allergic rhinitis 02/26/2015   Myalgia 10/16/2014   Glaucoma 05/07/2013   Essential hypertension, benign 05/07/2013   Seizures ([place]) 03/17/2013    Immunization History  Administered Date(s) Administered   Fluad Quad(high Dose 65+) 12/17/2018, 11/15/2019, 10/30/2020   Influenza, High Dose Seasonal PF 11/17/2012, 11/18/2016, 12/09/2017   Influenza,inj,Quad PF,6+ Mos 11/14/2013, 01/01/2016   Influenza-Unspecified 01/18/2015, 01/04/2017, 12/02/2018   PFIZER(Purple Top)SARS-COV-2 Vaccination 04/15/2019, 05/10/2019   Pneumococcal Conjugate-13 04/16/2015   Pneumococcal Polysaccharide-23 11/15/2019   Pneumococcal-Unspecified 02/17/2010    Tdap 08/17/2014    Conditions to be addressed/monitored:  Hypertension, Hyperlipidemia, Osteoarthritis, BPH and Constipation , Glaucoma  Care Plan : [place]  Updates made by Evan Escobar, [place] since 11/29/2020 12:00 AM     Problem: Hypertension, Hyperlipidemia, Osteoarthritis, BPH and Constipation   Priority: High     Long-Range Goal: Disease management   Start Date: 05/30/2020  Expected End Date: 11/29/2021  This Visit's Progress: On track  Recent Progress:  On track  Priority: High  Note:   Current Barriers:  Unable to independently monitor therapeutic efficacy Unable to achieve control of constipation   Pharmacist Clinical Goal(s):  Patient will achieve adherence to monitoring guidelines and medication adherence to achieve therapeutic efficacy achieve control of arthritis, constipation as evidenced by patient report through collaboration with PharmD and provider.   Interventions: 1:1 collaboration with Evan Koch, MD regarding development and update of comprehensive plan of care as evidenced by provider attestation and co-signature Inter-disciplinary care team collaboration (see longitudinal plan of care) Comprehensive medication review performed; medication list updated in electronic medical record  Hypertension (BP goal <130/80) -Controlled - BP low-normal at home per patient; he had elevated BP at recent office visit but he reports he went home and checked and his BP was normal again; he notes he was somewhat stressed at the appt -Current home readings: 124/82 -Current treatment: Lisinopril 20 mg daily -Counseled to monitor BP at home daily -Recommended to continue current medication  BPH (Goal: reduce urinary symptoms) -Controlled - pt reports frequent nocturia has improved somewhat when he avoids drinking water later in the evening -Current treatment  Tamsulosin 0.4 mg daily -Medications previously tried: solifenacin -Counseled  on BPH, indication and benefits of tamsulosin;  -Recommended to avoid fluid intake after 6pm  Chronic constipation (Goal: improve symptoms) -Improving - pt reports constipation still comes and goes, there has been some improvement since starting daily Linzess -Pt has seen GI previously for similar issues -Current treatment  Benefiber TID Milk of magnesia Linzess 145 mcg - stopped after 2 weeks -Medications previously tried: n/a -Recommended to start colace 100 mg daily  Leg cramps (Goal: manage symptoms) -Not ideally controlled - pt reports methocarbamol did not help with the cramps; he thinks the cramps are related to his constipation because pain is improved when he is not struggling with constipation -Current treatment  Methocarbamol - not taking -Counseled on hydration, maintaining balanced diet  Patient Goals/Self-Care Activities Patient will:  - take medications as prescribed -focus on medication adherence by routine -check blood pressure daily, document, and provide at future appointments -Try Colace stool softener      Medication Assistance: None required.  Patient affirms current coverage meets needs.  Compliance/Adherence/Medication fill history: Care Gaps: Hep C screening  Star-Rating Drugs: Lisinopril - LF 09/18/20 x 90 ds  Patient's preferred pharmacy is:  [facility], [place] - [address] AT [facility] [place] [address] Phone: [phone] Fax: [phone]  Uses pill box? No - prefers bottles Pt endorses 100% compliance  We discussed: Current pharmacy is preferred with insurance plan and patient is satisfied with pharmacy services.  Patient decided to: Continue current medication management strategy  Care Plan and Follow Up Patient Decision:  Patient agrees to Care Plan and Follow-up.  Plan: Telephone follow up appointment with care management team member scheduled for:  6 months  Charlene Brooke, PharmD, [place], CPP Clinical Pharmacist [facility] [phone]

## 2020-12-09 ENCOUNTER — Other Ambulatory Visit: Payer: Self-pay | Admitting: Internal Medicine

## 2020-12-11 ENCOUNTER — Telehealth: Payer: Self-pay | Admitting: Internal Medicine

## 2020-12-11 NOTE — Telephone Encounter (Signed)
1.Medication Requested: tamsulosin (FLOMAX) 0.4 MG CAPS capsule  2. Pharmacy (Name, [place]):  [facility], [facility] Phone:  [phone]  Fax:  [phone]      3. On Med List: y  87. Last Visit with PCP: 9.13.2022  5. Next visit date with PCP:n/a   Agent: Please be advised that RX refills may take up to 3 business days. We ask that you follow-up with your pharmacy.

## 2020-12-12 MED ORDER — TAMSULOSIN HCL 0.4 MG PO CAPS: ORAL_CAPSULE | ORAL | 0 refills | Status: DC

## 2020-12-12 NOTE — Telephone Encounter (Signed)
Refill has been sent to the patient's pharmacy.  

## 2020-12-17 DIAGNOSIS — N401 Enlarged prostate with lower urinary tract symptoms: Secondary | ICD-10-CM

## 2020-12-17 DIAGNOSIS — I10 Essential (primary) hypertension: Secondary | ICD-10-CM | POA: Diagnosis not present

## 2020-12-17 DIAGNOSIS — R350 Frequency of micturition: Secondary | ICD-10-CM

## 2021-01-08 ENCOUNTER — Ambulatory Visit (INDEPENDENT_AMBULATORY_CARE_PROVIDER_SITE_OTHER): Payer: Medicare Other | Admitting: Podiatry

## 2021-01-08 ENCOUNTER — Other Ambulatory Visit: Payer: Self-pay

## 2021-01-08 ENCOUNTER — Encounter: Payer: Self-pay | Admitting: Podiatry

## 2021-01-08 DIAGNOSIS — M79675 Pain in left toe(s): Secondary | ICD-10-CM

## 2021-01-08 DIAGNOSIS — B351 Tinea unguium: Secondary | ICD-10-CM | POA: Diagnosis not present

## 2021-01-08 DIAGNOSIS — M79674 Pain in right toe(s): Secondary | ICD-10-CM

## 2021-01-08 NOTE — Patient Instructions (Signed)
Recommend Skechers Loafers with stretchable uppers and memory foam insoles. They can be purchased at [employer], [employer] or [employer]. Also on [email or web address].  

## 2021-01-12 NOTE — Progress Notes (Signed)
  Subjective:  Patient ID: Evan Escobar, male    DOB: 1940-02-19,  MRN: [id number]  Alexande Sheerin presents to clinic today for painful elongated mycotic toenails 1-5 bilaterally which are tender when wearing enclosed shoe gear. Pain is relieved with periodic professional debridement.  He voices no new pedal concerns on today's visit.  PCP is [name], MD , and last visit was 08/27/2020.  No Known Allergies  Review of Systems: Negative except as noted in the HPI. Objective:   Constitutional Mayur Duman is a pleasant 80 y.o. African American male, in NAD. AAO x 3.   Vascular CFT immediate b/l LE. Palpable DP/PT pulses b/l LE. Digital hair sparse b/l. Skin temperature gradient WNL b/l. No pain with calf compression b/l. No edema noted b/l. No cyanosis or clubbing noted b/l LE.  Neurologic Normal speech. Oriented to person, place, and time. Protective sensation intact 5/5 intact bilaterally with 10g monofilament b/l. Vibratory sensation intact b/l.  Dermatologic Pedal integument with normal turgor, texture and tone b/l LE. No open wounds b/l. No interdigital macerations b/l. Toenails 1-5 b/l elongated, thickened, discolored with subungual debris. +Tenderness with dorsal palpation of nailplates. No hyperkeratotic or porokeratotic lesions present.  Orthopedic: Normal muscle strength 5/5 to all lower extremity muscle groups bilaterally. HAV with bunion deformity noted b/l LE. Hammertoe deformity noted 2-5 b/l.Marland Kitchen No pain, crepitus or joint limitation noted with ROM b/l LE.  Patient ambulates independently without assistive aids.   Radiographs: None   Assessment:   1. Pain due to onychomycosis of toenails of both feet    Plan:  Patient was evaluated and treated and all questions answered. Consent given for treatment as described below: -Mycotic toenails 1-5 bilaterally were debrided in length and girth with sterile nail nippers and dremel without incident. -Recommended Skechers  shoes with stretchable uppers and memory foam insoles. -Patient/POA to call should there be question/concern in the interim.  Return in about 3 months (around 04/10/2021).  Marzetta Board, DPM

## 2021-01-24 DIAGNOSIS — H16223 Keratoconjunctivitis sicca, not specified as Sjogren's, bilateral: Secondary | ICD-10-CM | POA: Diagnosis not present

## 2021-01-24 DIAGNOSIS — H401133 Primary open-angle glaucoma, bilateral, severe stage: Secondary | ICD-10-CM | POA: Diagnosis not present

## 2021-01-24 DIAGNOSIS — H348312 Tributary (branch) retinal vein occlusion, right eye, stable: Secondary | ICD-10-CM | POA: Diagnosis not present

## 2021-01-24 DIAGNOSIS — H348121 Central retinal vein occlusion, left eye, with retinal neovascularization: Secondary | ICD-10-CM | POA: Diagnosis not present

## 2021-01-24 DIAGNOSIS — H3562 Retinal hemorrhage, left eye: Secondary | ICD-10-CM | POA: Diagnosis not present

## 2021-02-05 ENCOUNTER — Other Ambulatory Visit: Payer: Self-pay

## 2021-02-05 ENCOUNTER — Encounter (INDEPENDENT_AMBULATORY_CARE_PROVIDER_SITE_OTHER): Payer: Medicare Other | Admitting: Ophthalmology

## 2021-02-05 DIAGNOSIS — I10 Essential (primary) hypertension: Secondary | ICD-10-CM

## 2021-02-05 DIAGNOSIS — H35033 Hypertensive retinopathy, bilateral: Secondary | ICD-10-CM | POA: Diagnosis not present

## 2021-02-05 DIAGNOSIS — H34831 Tributary (branch) retinal vein occlusion, right eye, with macular edema: Secondary | ICD-10-CM | POA: Diagnosis not present

## 2021-02-05 DIAGNOSIS — H34812 Central retinal vein occlusion, left eye, with macular edema: Secondary | ICD-10-CM | POA: Diagnosis not present

## 2021-02-05 DIAGNOSIS — H43813 Vitreous degeneration, bilateral: Secondary | ICD-10-CM | POA: Diagnosis not present

## 2021-02-06 ENCOUNTER — Encounter (INDEPENDENT_AMBULATORY_CARE_PROVIDER_SITE_OTHER): Payer: Medicare Other | Admitting: Ophthalmology

## 2021-03-21 ENCOUNTER — Other Ambulatory Visit: Payer: Self-pay | Admitting: Internal Medicine

## 2021-03-21 DIAGNOSIS — I10 Essential (primary) hypertension: Secondary | ICD-10-CM

## 2021-04-08 ENCOUNTER — Telehealth: Payer: Self-pay

## 2021-04-08 NOTE — Progress Notes (Signed)
Chronic Care Management Pharmacy Assistant   Name: Evan Escobar  MRN: [id number] DOB: 10-28-1940  Evan Escobar is an 81 y.o. year old male who presents for his follow-up CCM visit with the clinical pharmacist.  Reason for Encounter: Disease State   Conditions to be addressed/monitored: HTN   Recent office visits:  None ID  Recent consult visits:  01/08/21 Marzetta Board, DPM-Podiatry ( Nail problems) No orders or med changes  Hospital visits:  None in previous 6 months  Medications: Outpatient Encounter Medications as of 04/08/2021  Medication Sig   acetaminophen (TYLENOL) 500 MG tablet Take 500 mg by mouth every 6 (six) hours as needed for moderate pain or headache.    aspirin 81 MG tablet Take 81 mg by mouth daily.   brimonidine (ALPHAGAN P) 0.1 % SOLN Place 1 drop 3 (three) times daily into both eyes.   cetirizine (ZYRTEC) 5 MG tablet Take 1 tablet (5 mg total) by mouth daily.   cycloSPORINE, PF, (CEQUA) 0.09 % SOLN Apply 1 drop to eye 2 (two) times daily.   docusate sodium (COLACE) 100 MG capsule Take 200 mg by mouth 2 (two) times daily.   fluticasone (FLONASE) 50 MCG/ACT nasal spray Place 2 sprays into both nostrils daily.   latanoprost (XALATAN) 0.005 % ophthalmic solution Place 1 drop into both eyes at bedtime.    levETIRAcetam (KEPPRA) 500 MG tablet Take 1 tablet (500 mg total) by mouth 2 (two) times daily.   linaclotide (LINZESS) 145 MCG CAPS capsule Take 1 capsule (145 mcg total) by mouth daily before breakfast.   lisinopril (ZESTRIL) 20 MG tablet TAKE 1 TABLET(20 MG) BY MOUTH DAILY   magnesium hydroxide (MILK OF MAGNESIA) 400 MG/5ML suspension Take 30 mLs by mouth as needed for mild constipation.   Multiple Vitamin (MULTIVITAMIN) tablet Take 1 tablet by mouth daily.   RESTASIS 0.05 % ophthalmic emulsion 1 drop 2 (two) times daily.   tamsulosin (FLOMAX) 0.4 MG CAPS capsule TAKE 1 CAPSULE(0.4 MG) BY MOUTH DAILY   No facility-administered encounter  medications on file as of 04/08/2021.   Reviewed chart prior to disease state call. Spoke with patient regarding BP  Recent Office Vitals: BP Readings from Last 3 Encounters:  11/29/20 124/82  11/27/20 (!) 170/73  10/30/20 128/74   Pulse Readings from Last 3 Encounters:  11/27/20 (!) 58  10/30/20 (!) 53  09/03/20 60    Wt Readings from Last 3 Encounters:  11/27/20 143 lb (64.9 kg)  10/30/20 143 lb (64.9 kg)  08/13/20 143 lb 6.4 oz (65 kg)     Kidney Function Lab Results  Component Value Date/Time   CREATININE 1.19 08/13/2020 10:24 AM   CREATININE 1.28 (H) 10/13/2019 03:32 PM   CREATININE 1.25 07/14/2019 08:48 AM   GFR 58.28 (L) 08/13/2020 10:24 AM   GFRNONAA 53 (L) 10/13/2019 03:32 PM   GFRAA 62 10/13/2019 03:32 PM    BMP Latest Ref Rng & Units 08/13/2020 10/13/2019 07/14/2019  Glucose 70 - 99 mg/dL 88 88 120(H)  BUN 6 - 23 mg/dL 17 15 19   Creatinine 0.40 - 1.50 mg/dL 1.19 1.28(H) 1.25  BUN/Creat Ratio 6 - 22 (calc) - 12 -  Sodium 135 - 145 mEq/L 136 136 133(L)  Potassium 3.5 - 5.1 mEq/L 4.1 4.3 4.2  Chloride 96 - 112 mEq/L 103 101 101  CO2 19 - 32 mEq/L 26 30 27   Calcium 8.4 - 10.5 mg/dL 9.2 9.6 9.1    Current antihypertensive regimen:  Lisinopril 20 mg  daily  How often are you checking your Blood Pressure? Patient states that he does check blood pressure daily but does not remember last reading  Current home BP readings: NA  What recent interventions/DTPs have been made by any provider to improve Blood Pressure control since last CPP Visit: none noted  Any recent hospitalizations or ED visits since last visit with CPP? No  What diet changes have been made to improve Blood Pressure Control?  Patient states that he has not made any changes to diet  What exercise is being done to improve your Blood Pressure Control?  Patient states that he does try and walk 2 or 3 days a week  Adherence Review: Is the patient currently on ACE/ARB medication? Yes Does the  patient have >5 day gap between last estimated fill dates? No   Care Gaps: Colonoscopy-NA Diabetic Foot Exam-NA Ophthalmology-NA Dexa Scan - NA Annual Well Visit - NA Micro albumin-NA Hemoglobin A1c- NA  Star Rating Drugs: Lisinopril 20 mg-last fill 12/18/20 90 ds  [place] Pharmacist Assistant [phone]

## 2021-04-16 ENCOUNTER — Encounter: Payer: Self-pay | Admitting: Internal Medicine

## 2021-04-16 ENCOUNTER — Other Ambulatory Visit: Payer: Self-pay

## 2021-04-16 ENCOUNTER — Ambulatory Visit (INDEPENDENT_AMBULATORY_CARE_PROVIDER_SITE_OTHER): Payer: Medicare Other | Admitting: Internal Medicine

## 2021-04-16 VITALS — BP 126/74 | HR 63 | Resp 18 | Ht 67.0 in | Wt 147.0 lb

## 2021-04-16 DIAGNOSIS — K5904 Chronic idiopathic constipation: Secondary | ICD-10-CM

## 2021-04-16 DIAGNOSIS — R350 Frequency of micturition: Secondary | ICD-10-CM

## 2021-04-16 DIAGNOSIS — I10 Essential (primary) hypertension: Secondary | ICD-10-CM | POA: Diagnosis not present

## 2021-04-16 DIAGNOSIS — M79672 Pain in left foot: Secondary | ICD-10-CM | POA: Diagnosis not present

## 2021-04-16 DIAGNOSIS — N401 Enlarged prostate with lower urinary tract symptoms: Secondary | ICD-10-CM | POA: Diagnosis not present

## 2021-04-16 DIAGNOSIS — R21 Rash and other nonspecific skin eruption: Secondary | ICD-10-CM | POA: Insufficient documentation

## 2021-04-16 LAB — LIPID PANEL
Cholesterol: 191 mg/dL (ref 0–200)
HDL: 69.9 mg/dL (ref >39.00)
LDL Cholesterol: 107 mg/dL — ABNORMAL HIGH (ref 0–99)
NonHDL: 121.59
Total CHOL/HDL Ratio: 3
Triglycerides: 72 mg/dL (ref 0.0–149.0)
VLDL: 14.4 mg/dL (ref 0.0–40.0)

## 2021-04-16 LAB — COMPREHENSIVE METABOLIC PANEL
ALT: 10 U/L (ref 0–53)
AST: 17 U/L (ref 0–37)
Albumin: 4.2 g/dL (ref 3.5–5.2)
Alkaline Phosphatase: 27 U/L — ABNORMAL LOW (ref 39–117)
BUN: 18 mg/dL (ref 6–23)
CO2: 29 mEq/L (ref 19–32)
Calcium: 9.5 mg/dL (ref 8.4–10.5)
Chloride: 103 mEq/L (ref 96–112)
Creatinine, Ser: 1.23 mg/dL (ref 0.40–1.50)
GFR: 55.75 mL/min — ABNORMAL LOW (ref >60.00)
Glucose, Bld: 87 mg/dL (ref 70–99)
Potassium: 4 mEq/L (ref 3.5–5.1)
Sodium: 138 mEq/L (ref 135–145)
Total Bilirubin: 0.4 mg/dL (ref 0.2–1.2)
Total Protein: 7.2 g/dL (ref 6.0–8.3)

## 2021-04-16 LAB — CBC
HCT: 35.5 % — ABNORMAL LOW (ref 39.0–52.0)
Hemoglobin: 11.7 g/dL — ABNORMAL LOW (ref 13.0–17.0)
MCHC: 32.9 g/dL (ref 30.0–36.0)
MCV: 85 fl (ref 78.0–100.0)
Platelets: 151 10*3/uL (ref 150.0–400.0)
RBC: 4.18 Mil/uL — ABNORMAL LOW (ref 4.22–5.81)
RDW: 14.4 % (ref 11.5–15.5)
WBC: 3 10*3/uL — ABNORMAL LOW (ref 4.0–10.5)

## 2021-04-16 LAB — URINALYSIS, ROUTINE W REFLEX MICROSCOPIC
Bilirubin Urine: NEGATIVE
Hgb urine dipstick: NEGATIVE
Ketones, ur: NEGATIVE
Nitrite: NEGATIVE
Specific Gravity, Urine: <=1.005 — AB (ref 1.000–1.030)
Total Protein, Urine: NEGATIVE
Urine Glucose: NEGATIVE
Urobilinogen, UA: 0.2 (ref 0.0–1.0)
pH: 6 (ref 5.0–8.0)

## 2021-04-16 NOTE — Assessment & Plan Note (Signed)
He already has a podiatrist and has visit scheduled with them in

## 2021-04-16 NOTE — Assessment & Plan Note (Signed)
Having some struggles with going to the bathroom lately. Taking flomax 0.4 mg daily. Checking U/A for signs of infection. If normal can consider increasing dose of flomax or adding proscar to regimen. Not well controlled at this time with moderate exacerbation.

## 2021-04-16 NOTE — Progress Notes (Signed)
° °  Subjective:   Patient ID: Evan Escobar, male    DOB: 1940-12-23, 81 y.o.   MRN: [id number]  HPI The patient is a 81 YO man coming in for concerns and follow up.  Review of Systems  Constitutional: Negative.   HENT: Negative.         Right ear discomfort  Eyes: Negative.   Respiratory:  Negative for cough, chest tightness and shortness of breath.   Cardiovascular:  Negative for chest pain, palpitations and leg swelling.  Gastrointestinal:  Positive for constipation. Negative for abdominal distention, abdominal pain, diarrhea, nausea and vomiting.  Musculoskeletal:  Positive for arthralgias.  Skin:  Positive for rash.  Neurological: Negative.   Psychiatric/Behavioral: Negative.     Objective:  Physical Exam Constitutional:      Appearance: He is well-developed.  HENT:     Head: Normocephalic and atraumatic.     Ears:     Comments:  right ear canal impacted with copious hard wax, examination post ear lavage canal is clear and no bleeding or complications noted.   Cardiovascular:     Rate and Rhythm: Normal rate and regular rhythm.  Pulmonary:     Effort: Pulmonary effort is normal. No respiratory distress.     Breath sounds: Normal breath sounds. No wheezing or rales.  Abdominal:     General: Bowel sounds are normal. There is no distension.     Palpations: Abdomen is soft.     Tenderness: There is no abdominal tenderness. There is no rebound.  Musculoskeletal:     Cervical back: Normal range of motion.  Skin:    General: Skin is warm and dry.     Findings: Rash present.     Comments: Few bumps left olecranon region without stigmata of scratching  Neurological:     Mental Status: He is alert and oriented to person, place, and time.     Coordination: Coordination abnormal.     Comments: Slow to stand and slow gait    Vitals:   04/16/21 0804  BP: 126/74  Pulse: 63  Resp: 18  SpO2: 99%  Weight: 147 lb (66.7 kg)  Height: 5\' 7"  (1.702 m)    This visit occurred  during the SARS-CoV-2 public health emergency.  Safety protocols were in place, including screening questions prior to the visit, additional usage of staff PPE, and extensive cleaning of exam room while observing appropriate contact time as indicated for disinfecting solutions.   Assessment & Plan:

## 2021-04-16 NOTE — Assessment & Plan Note (Signed)
New problem in the last month, used otc cream on this which did help some. He has mild itching in the area. Looks to be contact dermatitis. Checking CBC and CMP for metabolic cause. No recent medication changes. If all normal can consider change of lisinopril to another agent.

## 2021-04-16 NOTE — Assessment & Plan Note (Signed)
Taking linzess 145 mcg daily and he is not sure if this is helping well. States he takes fiber also and sometimes milk of magnesium which does help him have a BM. He is up to date on colonoscopy and aged out of screening. No change to bowel habits recently. Offered increased dose of linzess but he is not sure about this.

## 2021-04-16 NOTE — Patient Instructions (Addendum)
Call [facility] for a visit to address the feet.  [phone]  We have cleaned out the right ear today and are checking the labs.

## 2021-04-16 NOTE — Assessment & Plan Note (Signed)
BP at goal on lisinopril 20 mg daily. Checking CMP and CBC and lipid panel for monitoring. He is having new rash and if no cause could consider changing lisinopril to another agent.

## 2021-04-18 ENCOUNTER — Other Ambulatory Visit: Payer: Self-pay | Admitting: Internal Medicine

## 2021-04-18 MED ORDER — NITROFURANTOIN MONOHYD MACRO 100 MG PO CAPS: 100.0000 mg | ORAL_CAPSULE | Freq: Two times a day (BID) | ORAL | 0 refills | Status: DC

## 2021-04-19 ENCOUNTER — Ambulatory Visit: Payer: Medicare Other | Admitting: Podiatry

## 2021-04-22 ENCOUNTER — Ambulatory Visit: Payer: Medicare Other | Admitting: Podiatry

## 2021-05-14 ENCOUNTER — Telehealth: Payer: Self-pay | Admitting: Internal Medicine

## 2021-05-14 ENCOUNTER — Telehealth: Payer: Medicare Other

## 2021-05-14 NOTE — Progress Notes (Deleted)
? ?Chronic Care Management ?Pharmacy Note ? ?05/14/2021 ?Name:  [place] MRN:  [id number] DOB:  [birth year] ? ?Summary: ?-Pt reports constipation improved somewhat with daily Linzess, still has problems every few days. He has not tried a stool softener. ?-Pt stopped taking methocarbamol since it did not help with leg cramps ? ?Recommendations/Changes made from today's visit: ?-Counseled on hydration and fiber for constipation ?-Recommended Colace 100 mg/day ?-Removed methocarbamol from med list ? ? ?Subjective: ?Evan [place] is an 81 y.o. year old male who is a primary patient of [name], MD.  The CCM team was consulted for assistance with disease management and care coordination needs.   ? ?Engaged with patient by telephone for follow up visit in response to provider referral for pharmacy case management and/or care coordination services.  ? ?Consent to Services:  ?The patient was given information about Chronic Care Management services, agreed to services, and gave verbal consent prior to initiation of services.  Please see initial visit note for detailed documentation.  ? ?Patient Care Team: ?[name], MD as PCP - General (Internal Medicine) ?Marcial Pacas, MD as Consulting Physician (Neurology) ?Wallene Huh, DPM as Consulting Physician (Podiatry) ?Foltanski, Cleaster Corin, [facility] as Pharmacist (Pharmacist) ? ? ?Patient lives at home with wife. 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  ? ?Recent office visits: ?04/16/2021 - Dr. Sharlet Salina - no changes to medications  ? ?Recent consult visits: ?01/08/2021 - Dr. Elisha Ponder - Podiatry - no changes to medications -recommended to use different shoes with memory foam insoles - f/u in 3 months  ? ?Hospital visits: ?None in previous 6 months ? ?Objective: ? ?Lab Results  ?Component Value Date  ? CREATININE 1.23 04/16/2021  ? BUN 18 04/16/2021  ? GFR 55.75 (L) 04/16/2021  ? GFRNONAA 53 (L)  10/13/2019  ? GFRAA 62 10/13/2019  ? NA 138 04/16/2021  ? K 4.0 04/16/2021  ? CALCIUM 9.5 04/16/2021  ? CO2 29 04/16/2021  ? GLUCOSE 87 04/16/2021  ? ? ?Lab Results  ?Component Value Date/Time  ? HGBA1C 5.8 01/10/2019 08:51 AM  ? GFR 55.75 (L) 04/16/2021 08:54 AM  ? GFR 58.28 (L) 08/13/2020 10:24 AM  ?  ?Last diabetic Eye exam: No results found for: HMDIABEYEEXA  ?Last diabetic Foot exam: No results found for: HMDIABFOOTEX  ? ?Lab Results  ?Component Value Date  ? CHOL 191 04/16/2021  ? HDL 69.90 04/16/2021  ? LDLCALC 107 (H) 04/16/2021  ? TRIG 72.0 04/16/2021  ? CHOLHDL 3 04/16/2021  ? ? ? ?  Latest Ref Rng & Units 04/16/2021  ?  8:54 AM 08/13/2020  ? 10:24 AM 10/13/2019  ?  3:32 PM  ?Hepatic Function  ?Total Protein 6.0 - 8.3 g/dL 7.2   7.1   7.1    ?Albumin 3.5 - 5.2 g/dL 4.2   4.2     ?AST 0 - 37 U/L '17   16   15    ' ?ALT 0 - 53 U/L '10   9   7    ' ?Alk Phosphatase 39 - 117 U/L 27   24     ?Total Bilirubin 0.2 - 1.2 mg/dL 0.4   0.5   0.4    ?Bilirubin, Direct 0.0 - 0.2 mg/dL   0.1    ? ? ?Lab Results  ?Component Value Date/Time  ? TSH 0.96 10/13/2019 03:32 PM  ? TSH 0.86 01/10/2019 08:51 AM  ? FREET4 0.95 01/10/2019 08:51 AM  ? ? ? ?  Latest Ref Rng & Units 04/16/2021  ?  8:54 AM 08/13/2020  ? 10:24 AM 10/13/2019  ?  3:32 PM  ?CBC  ?WBC 4.0 - 10.5 K/uL 3.0   6.0   4.1    ?Hemoglobin 13.0 - 17.0 g/dL 11.7   11.1   11.7    ?Hematocrit 39.0 - 52.0 % 35.5   33.3   35.8    ?Platelets 150.0 - 400.0 K/uL 151.0   146.0   159    ? ? ?Lab Results  ?Component Value Date/Time  ? VD25OH 47.89 01/10/2019 08:51 AM  ? ? ?Clinical ASCVD: No  ?The 10-year ASCVD risk score (Arnett DK, et al., 2019) is: 21.7% ?  Values used to calculate the score: ?    Age: 81 years ?    Sex: Male ?    Is Non-Hispanic African American: Yes ?    Diabetic: No ?    Tobacco smoker: No ?    Systolic Blood Pressure: 188 mmHg ?    Is BP treated: Yes ?    HDL Cholesterol: 69.9 mg/dL ?    Total Cholesterol: 191 mg/dL   ? ? ?  08/13/2020  ? 10:03 AM 07/14/2019  ?  8:27  AM 07/19/2018  ?  3:58 PM  ?Depression screen PHQ 2/9  ?Decreased Interest 0 0 0  ?Down, Depressed, Hopeless 0 0 0  ?PHQ - 2 Score 0 0 0  ?  ? ?Social History  ? ?Tobacco Use  ?Smoking Status Former  ? Packs/day: 1.00  ? Types: Cigarettes  ? Quit date: 02/18/2007  ? Years since quitting: 14.2  ?Smokeless Tobacco Never  ?Tobacco Comments  ? not sure but a while ago; quit smoking and ETOH  ? ?BP Readings from Last 3 Encounters:  ?04/16/21 126/74  ?11/29/20 124/82  ?11/27/20 (!) 170/73  ? ?Pulse Readings from Last 3 Encounters:  ?04/16/21 63  ?11/27/20 (!) 58  ?10/30/20 (!) 53  ? ?Wt Readings from Last 3 Encounters:  ?04/16/21 147 lb (66.7 kg)  ?11/27/20 143 lb (64.9 kg)  ?10/30/20 143 lb (64.9 kg)  ? ?BMI Readings from Last 3 Encounters:  ?04/16/21 23.02 kg/m?  ?11/27/20 22.40 kg/m?  ?10/30/20 22.40 kg/m?  ? ? ?Assessment/Interventions: Review of patient past medical history, allergies, medications, health status, including review of consultants reports, laboratory and other test data, was performed as part of comprehensive evaluation and provision of chronic care management services.  ? ?SDOH:  (Social Determinants of Health) assessments and interventions performed: Yes ? ? ?SDOH Screenings  ? ?Alcohol Screen: Not on file  ?Depression (PHQ2-9): Low Risk   ? PHQ-2 Score: 0  ?Financial Resource Strain: Low Risk   ? Difficulty of Paying Living Expenses: Not hard at all  ?Food Insecurity: Not on file  ?Housing: Not on file  ?Physical Activity: Not on file  ?Social Connections: Not on file  ?Stress: Not on file  ?Tobacco Use: Medium Risk  ? Smoking Tobacco Use: Former  ? Smokeless Tobacco Use: Never  ? Passive Exposure: Not on file  ?Transportation Needs: Not on file  ? ? ?[place] ? ?No Known Allergies ? ?Medications Reviewed Today   ? ? Reviewed by [name], MD (Physician) on 04/16/21 at [phone]  Med List Status: <None>  ? ?Medication Order Taking? Sig Documenting Provider Last Dose Status Informant   ?acetaminophen (TYLENOL) 500 MG tablet [id number] Yes Take 500 mg by mouth every 6 (six) hours as needed for moderate pain or headache.  [provider] Taking Active Self  ?aspirin 81 MG tablet [id number] Yes Take 81 mg by mouth daily. [provider] Taking Active Self  ?brimonidine (ALPHAGAN P) 0.1 % SOLN [id number] Yes Place 1 drop 3 (three) times daily into both eyes. [provider] Taking Active Self  ?cetirizine (ZYRTEC) 5 MG tablet [id number] Yes Take 1 tablet (5 mg total) by mouth daily. Jaynee Eagles, PA-C Taking Active   ?cycloSPORINE, PF, (CEQUA) 0.09 % SOLN [id number] Yes Apply 1 drop to eye 2 (two) times daily. [provider] Taking Active   ?docusate sodium (COLACE) 100 MG capsule [id number] Yes Take 200 mg by mouth 2 (two) times daily. [provider] Taking Active   ?fluticasone (FLONASE) 50 MCG/ACT nasal spray [id number] Yes Place 2 sprays into both nostrils daily. Jaynee Eagles, PA-C Taking Active   ?latanoprost (XALATAN) 0.005 % ophthalmic solution [id number] Yes Place 1 drop into both eyes at bedtime.  [provider] Taking Active Self  ?levETIRAcetam (KEPPRA) 500 MG tablet [id number] Yes Take 1 tablet (500 mg total) by mouth 2 (two) times daily. Marcial Pacas, MD Taking Active   ?linaclotide Rolan Lipa) 145 MCG CAPS capsule [id number] Yes Take 1 capsule (145 mcg total) by mouth daily before breakfast. [name], MD Taking Active   ?lisinopril (ZESTRIL) 20 MG tablet [id number] Yes TAKE 1 TABLET(20 MG) BY MOUTH DAILY [name], MD Taking Active   ?magnesium hydroxide (MILK OF MAGNESIA) 400 MG/5ML suspension [id number] Yes Take 30 mLs by mouth as needed for mild constipation. [provider] Taking Active   ?Multiple Vitamin (MULTIVITAMIN) tablet [id number] Yes Take 1 tablet by mouth daily. [provider] Taking Active Self  ?RESTASIS 0.05 % ophthalmic emulsion [id number] Yes 1 drop 2 (two) times daily. [provider] Taking Active   ?tamsulosin (FLOMAX) 0.4 MG CAPS capsule [id number] Yes TAKE 1 CAPSULE(0.4 MG) BY MOUTH DAILY [name], MD Taking Active   ? ?  ?  ? ?  ? ? ?Patient Active Problem List  ? Diagnosi

## 2021-05-14 NOTE — Telephone Encounter (Signed)
Pt states he did not receive a phone call for his pharmacist appt today at 3:30 ? ?Pt requesting a cb ?

## 2021-05-16 ENCOUNTER — Telehealth: Payer: Medicare Other

## 2021-05-16 NOTE — Progress Notes (Deleted)
? ?Chronic Care Management ?Pharmacy Note ? ?05/16/2021 ?Name:  Evan Escobar MRN:  [id number] DOB:  Jun 15, 1940 ? ?Summary: ?-Pt reports constipation improved somewhat with daily Linzess, still has problems every few days. He has not tried a stool softener. ?-Pt stopped taking methocarbamol since it did not help with leg cramps ? ?Recommendations/Changes made from today's visit: ?-Counseled on hydration and fiber for constipation ?-Recommended Colace 100 mg/day ?-Removed methocarbamol from med list ? ? ?Subjective: ?Evan Escobar is an 81 y.o. year old male who is a primary patient of [name], MD.  The CCM team was consulted for assistance with disease management and care coordination needs.   ? ?Engaged with patient by telephone for follow up visit in response to provider referral for pharmacy case management and/or care coordination services.  ? ?Consent to Services:  ?The patient was given information about Chronic Care Management services, agreed to services, and gave verbal consent prior to initiation of services.  Please see initial visit note for detailed documentation.  ? ?Patient Care Team: ?[name], MD as PCP - General (Internal Medicine) ?Marcial Pacas, MD as Consulting Physician (Neurology) ?Wallene Huh, DPM as Consulting Physician (Podiatry) ?Foltanski, Cleaster Corin, [facility] as Pharmacist (Pharmacist) ? ? ?Patient lives at home with wife. 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  ? ?Recent office visits: ?04/16/2021 - Dr. Sharlet Salina - no changes to medications  ? ?Recent consult visits: ?01/08/2021 - Dr. Elisha Ponder - Podiatry - no changes to medications -recommended to use different shoes with memory foam insoles - f/u in 3 months  ? ?Hospital visits: ?None in previous 6 months ? ?Objective: ? ?Lab Results  ?Component Value Date  ? CREATININE 1.23 04/16/2021  ? BUN 18 04/16/2021  ? GFR 55.75 (L) 04/16/2021  ? GFRNONAA 53 (L)  10/13/2019  ? GFRAA 62 10/13/2019  ? NA 138 04/16/2021  ? K 4.0 04/16/2021  ? CALCIUM 9.5 04/16/2021  ? CO2 29 04/16/2021  ? GLUCOSE 87 04/16/2021  ? ? ?Lab Results  ?Component Value Date/Time  ? HGBA1C 5.8 01/10/2019 08:51 AM  ? GFR 55.75 (L) 04/16/2021 08:54 AM  ? GFR 58.28 (L) 08/13/2020 10:24 AM  ?  ?Last diabetic Eye exam: No results found for: HMDIABEYEEXA  ?Last diabetic Foot exam: No results found for: HMDIABFOOTEX  ? ?Lab Results  ?Component Value Date  ? CHOL 191 04/16/2021  ? HDL 69.90 04/16/2021  ? LDLCALC 107 (H) 04/16/2021  ? TRIG 72.0 04/16/2021  ? CHOLHDL 3 04/16/2021  ? ? ? ?  Latest Ref Rng & Units 04/16/2021  ?  8:54 AM 08/13/2020  ? 10:24 AM 10/13/2019  ?  3:32 PM  ?Hepatic Function  ?Total Protein 6.0 - 8.3 g/dL 7.2   7.1   7.1    ?Albumin 3.5 - 5.2 g/dL 4.2   4.2     ?AST 0 - 37 U/L '17   16   15    ' ?ALT 0 - 53 U/L '10   9   7    ' ?Alk Phosphatase 39 - 117 U/L 27   24     ?Total Bilirubin 0.2 - 1.2 mg/dL 0.4   0.5   0.4    ?Bilirubin, Direct 0.0 - 0.2 mg/dL   0.1    ? ? ?Lab Results  ?Component Value Date/Time  ? TSH 0.96 10/13/2019 03:32 PM  ? TSH 0.86 01/10/2019 08:51 AM  ? FREET4 0.95 01/10/2019 08:51 AM  ? ? ? ?  Latest Ref Rng & Units 04/16/2021  ?  8:54 AM 08/13/2020  ? 10:24 AM 10/13/2019  ?  3:32 PM  ?CBC  ?WBC 4.0 - 10.5 K/uL 3.0   6.0   4.1    ?Hemoglobin 13.0 - 17.0 g/dL 11.7   11.1   11.7    ?Hematocrit 39.0 - 52.0 % 35.5   33.3   35.8    ?Platelets 150.0 - 400.0 K/uL 151.0   146.0   159    ? ? ?Lab Results  ?Component Value Date/Time  ? VD25OH 47.89 01/10/2019 08:51 AM  ? ? ?Clinical ASCVD: No  ?The 10-year ASCVD risk score (Arnett DK, et al., 2019) is: 21.7% ?  Values used to calculate the score: ?    Age: 81 years ?    Sex: Male ?    Is Non-Hispanic African American: Yes ?    Diabetic: No ?    Tobacco smoker: No ?    Systolic Blood Pressure: 387 mmHg ?    Is BP treated: Yes ?    HDL Cholesterol: 69.9 mg/dL ?    Total Cholesterol: 191 mg/dL   ? ? ?  08/13/2020  ? 10:03 AM 07/14/2019  ?  8:27  AM 07/19/2018  ?  3:58 PM  ?Depression screen PHQ 2/9  ?Decreased Interest 0 0 0  ?Down, Depressed, Hopeless 0 0 0  ?PHQ - 2 Score 0 0 0  ?  ? ?Social History  ? ?Tobacco Use  ?Smoking Status Former  ? Packs/day: 1.00  ? Types: Cigarettes  ? Quit date: 02/18/2007  ? Years since quitting: 14.2  ?Smokeless Tobacco Never  ?Tobacco Comments  ? not sure but a while ago; quit smoking and ETOH  ? ?BP Readings from Last 3 Encounters:  ?04/16/21 126/74  ?11/29/20 124/82  ?11/27/20 (!) 170/73  ? ?Pulse Readings from Last 3 Encounters:  ?04/16/21 63  ?11/27/20 (!) 58  ?10/30/20 (!) 53  ? ?Wt Readings from Last 3 Encounters:  ?04/16/21 147 lb (66.7 kg)  ?11/27/20 143 lb (64.9 kg)  ?10/30/20 143 lb (64.9 kg)  ? ?BMI Readings from Last 3 Encounters:  ?04/16/21 23.02 kg/m?  ?11/27/20 22.40 kg/m?  ?10/30/20 22.40 kg/m?  ? ? ?Assessment/Interventions: Review of patient past medical history, allergies, medications, health status, including review of consultants reports, laboratory and other test data, was performed as part of comprehensive evaluation and provision of chronic care management services.  ? ?SDOH:  (Social Determinants of Health) assessments and interventions performed: Yes ? ? ?SDOH Screenings  ? ?Alcohol Screen: Not on file  ?Depression (PHQ2-9): Low Risk   ? PHQ-2 Score: 0  ?Financial Resource Strain: Low Risk   ? Difficulty of Paying Living Expenses: Not hard at all  ?Food Insecurity: Not on file  ?Housing: Not on file  ?Physical Activity: Not on file  ?Social Connections: Not on file  ?Stress: Not on file  ?Tobacco Use: Medium Risk  ? Smoking Tobacco Use: Former  ? Smokeless Tobacco Use: Never  ? Passive Exposure: Not on file  ?Transportation Needs: Not on file  ? ? ?[place] ? ?No Known Allergies ? ?Medications Reviewed Today   ? ? Reviewed by [name], MD (Physician) on 04/16/21 at [phone]  Med List Status: <None>  ? ?Medication Order Taking? Sig Documenting Provider Last Dose Status Informant   ?acetaminophen (TYLENOL) 500 MG tablet [id number] Yes Take 500 mg by mouth every 6 (six) hours as needed for moderate pain or headache.  [provider] Taking Active Self  ?aspirin 81 MG tablet [id number] Yes Take 81 mg by mouth daily. [provider] Taking Active Self  ?brimonidine (ALPHAGAN P) 0.1 % SOLN [id number] Yes Place 1 drop 3 (three) times daily into both eyes. [provider] Taking Active Self  ?cetirizine (ZYRTEC) 5 MG tablet [id number] Yes Take 1 tablet (5 mg total) by mouth daily. Jaynee Eagles, PA-C Taking Active   ?cycloSPORINE, PF, (CEQUA) 0.09 % SOLN [id number] Yes Apply 1 drop to eye 2 (two) times daily. [provider] Taking Active   ?docusate sodium (COLACE) 100 MG capsule [id number] Yes Take 200 mg by mouth 2 (two) times daily. [provider] Taking Active   ?fluticasone (FLONASE) 50 MCG/ACT nasal spray [id number] Yes Place 2 sprays into both nostrils daily. Jaynee Eagles, PA-C Taking Active   ?latanoprost (XALATAN) 0.005 % ophthalmic solution [id number] Yes Place 1 drop into both eyes at bedtime.  [provider] Taking Active Self  ?levETIRAcetam (KEPPRA) 500 MG tablet [id number] Yes Take 1 tablet (500 mg total) by mouth 2 (two) times daily. Marcial Pacas, MD Taking Active   ?linaclotide Rolan Lipa) 145 MCG CAPS capsule [id number] Yes Take 1 capsule (145 mcg total) by mouth daily before breakfast. [name], MD Taking Active   ?lisinopril (ZESTRIL) 20 MG tablet [id number] Yes TAKE 1 TABLET(20 MG) BY MOUTH DAILY [name], MD Taking Active   ?magnesium hydroxide (MILK OF MAGNESIA) 400 MG/5ML suspension [id number] Yes Take 30 mLs by mouth as needed for mild constipation. [provider] Taking Active   ?Multiple Vitamin (MULTIVITAMIN) tablet [id number] Yes Take 1 tablet by mouth daily. [provider] Taking Active Self  ?RESTASIS 0.05 % ophthalmic emulsion [id number] Yes 1 drop 2 (two) times daily. [provider] Taking Active   ?tamsulosin (FLOMAX) 0.4 MG CAPS capsule [id number] Yes TAKE 1 CAPSULE(0.4 MG) BY MOUTH DAILY [name], MD Taking Active   ? ?  ?  ? ?  ? ? ?Patient Active Problem List  ? Diagnosi

## 2021-05-20 ENCOUNTER — Telehealth: Payer: Self-pay

## 2021-05-20 DIAGNOSIS — K5904 Chronic idiopathic constipation: Secondary | ICD-10-CM

## 2021-05-20 NOTE — Telephone Encounter (Signed)
Pt is requesting a referral to GI for constipation. Pt states the constipation medication isn't working. ? ?Please advise ?

## 2021-05-21 NOTE — Telephone Encounter (Signed)
We had offered dose change of medication at most recent visit does he want to try that? We can get him in with GI if he wants also. ?

## 2021-05-22 NOTE — Telephone Encounter (Signed)
Pt is willing to increase the dosage of the medication.  ? ?Pt reports that he feels like the medication is trying to work.  ?

## 2021-05-23 MED ORDER — LINACLOTIDE 290 MCG PO CAPS: 290.0000 ug | ORAL_CAPSULE | Freq: Every day | ORAL | 1 refills | Status: DC

## 2021-05-23 NOTE — Telephone Encounter (Signed)
Sent in higher dose follow up 1 month. Okay to take 2 pills daily of linzess he has at home. ?

## 2021-05-23 NOTE — Telephone Encounter (Signed)
Pt called back to check on update on dosage increase. I advised the pt that Dr. Sharlet Salina sent in a Rx to his preferred Pharmacy also that he could take 2 Lizness pill that he has currently at home. ? ?Pt understood and had no further questions ? ?FYI ?

## 2021-05-23 NOTE — Addendum Note (Signed)
Addended by: Pricilla Holm A on: 05/23/2021 09:02 AM ? ? Modules accepted: Orders ? ?

## 2021-06-12 ENCOUNTER — Other Ambulatory Visit: Payer: Self-pay | Admitting: Internal Medicine

## 2021-06-18 NOTE — Telephone Encounter (Signed)
Referral placed.

## 2021-06-18 NOTE — Telephone Encounter (Signed)
Pt states the dosage increase of linaclotide (LINZESS) 290 MCG CAPS capsule is not working ? ?Pt requesting to proceed w/ GI referral ? ?Pt requesting a cb w/ status update ? ?Phone [phone] ?

## 2021-06-18 NOTE — Addendum Note (Signed)
Addended by: Pricilla Holm A on: 06/18/2021 04:01 PM ? ? Modules accepted: Orders ? ?

## 2021-06-20 DIAGNOSIS — H348121 Central retinal vein occlusion, left eye, with retinal neovascularization: Secondary | ICD-10-CM | POA: Diagnosis not present

## 2021-06-20 DIAGNOSIS — H16223 Keratoconjunctivitis sicca, not specified as Sjogren's, bilateral: Secondary | ICD-10-CM | POA: Diagnosis not present

## 2021-06-20 DIAGNOSIS — H3562 Retinal hemorrhage, left eye: Secondary | ICD-10-CM | POA: Diagnosis not present

## 2021-06-20 DIAGNOSIS — H401133 Primary open-angle glaucoma, bilateral, severe stage: Secondary | ICD-10-CM | POA: Diagnosis not present

## 2021-06-20 DIAGNOSIS — H348312 Tributary (branch) retinal vein occlusion, right eye, stable: Secondary | ICD-10-CM | POA: Diagnosis not present

## 2021-07-13 ENCOUNTER — Ambulatory Visit (HOSPITAL_COMMUNITY)
Admission: EM | Admit: 2021-07-13 | Discharge: 2021-07-13 | Disposition: A | Discharge status: Home / Self Care | Payer: Medicare Other | Attending: Emergency Medicine | Admitting: Emergency Medicine

## 2021-07-13 ENCOUNTER — Encounter (HOSPITAL_COMMUNITY): Payer: Self-pay

## 2021-07-13 DIAGNOSIS — R051 Acute cough: Secondary | ICD-10-CM

## 2021-07-13 DIAGNOSIS — M79642 Pain in left hand: Secondary | ICD-10-CM

## 2021-07-13 DIAGNOSIS — M79641 Pain in right hand: Secondary | ICD-10-CM

## 2021-07-13 MED ORDER — BENZONATATE 100 MG PO CAPS: 100.0000 mg | ORAL_CAPSULE | Freq: Three times a day (TID) | ORAL | 0 refills | Status: DC

## 2021-07-13 MED ORDER — DICLOFENAC SODIUM 1 % EX GEL: 2.0000 g | Freq: Four times a day (QID) | CUTANEOUS | 1 refills | Status: DC

## 2021-07-13 NOTE — Discharge Instructions (Signed)
For your hands  -I believe your symptoms today to be an arthritic pain which is an inflammatory process -You may use cream over the hands 4 times daily to help calm discomfort and swelling -You may continue use of Tylenol as needed for comfort -You may hold heating pad over the affected area in 10 to 15-minute intervals as needed -You may attempt use of over-the-counter ibuprofen taking 600 mg every 6 hours as needed for additional comfort -When hand feels stiff and achy you may stretch the area using a "motion  For your cough  -You may use Tessalon pill every 8 hours as needed to help calm coughing -You may attempt use of over-the-counter Delsym as needed for additional comfort -You may attempt use of a antihistamine such as Claritin or Zyrtec to minimize your congestion -You may attempt use of a humidifier at nighttime which will help moisten your airways and reduce coughing -You may continue use of Tylenol to help with chest discomfort -At this time there are no signs of infection and I have a very low suspicion that your symptoms are related to pneumonia, bronchitis or a virus

## 2021-07-13 NOTE — ED Provider Notes (Signed)
[place]    CSN: [id number] Arrival date & time: 07/13/21  1008      History   Chief Complaint Chief Complaint  Patient presents with   Hand Pain    HPI Evan Escobar is a 81 y.o. male.   Presents with bilateral hand pain and intermittent swelling for 3 to 4 weeks.  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  Endorses limited range of motion to the right ring finger and numbness present only to the left thumb.  Range of motion of both hands with the exception of the above is intact.  Has attempted use of Tylenol and rubbing area with alcohol which benefits been minimally painful.  History of arthritis.    Patient concerned with rhinorrhea and a nonproductive cough with chest soreness for 3 to 4 weeks.  Symptoms began after wife was using fan in household.  Denies fever, chills, body aches, shortness of breath, wheezing.  Has not attempted treatment.  Denies seasonal allergies.    Past Medical History:  Diagnosis Date   Allergy    Arthritis    Cataract    Glaucoma    Hypertension    Normocytic anemia 09/25/2012   Polyp, sigmoid colon 11/05/2011   Seizure ([place])    Seizures ([place]) 03/17/2013    Patient Active Problem List   Diagnosis Date Noted   Rash 04/16/2021   Left foot pain 04/16/2021   Deficiency anemia 10/13/2019   Cough 10/13/2019   Chronic idiopathic constipation 10/13/2019   Cervical radiculopathy 01/10/2019   Eye problem 01/10/2019   Left lumbar radiculopathy 12/09/2017   Hearing loss 09/09/2016   Benign prostatic hyperplasia 08/28/2015   Routine general medical examination at a health care facility 08/28/2015   Allergic rhinitis 02/26/2015   Myalgia 10/16/2014   Glaucoma 05/07/2013   Essential hypertension, benign 05/07/2013   Seizures ([place]) 03/17/2013    Past Surgical History:  Procedure Laterality Date   ANTERIOR CERVICAL DECOMP/DISCECTOMY FUSION N/A 05/22/2017   Procedure: Anterior Cervical  Discectomy Fusion - Cervical Three-Cervical Four - Cervical Four-Cervical Five;  Surgeon: Kary Kos, MD;  Location: [facility];  Service: Neurosurgery;  Laterality: N/A;   CATARACT EXTRACTION, BILATERAL     EYE SURGERY     GLAUCOMA REPAIR  10 yrs ago       Home Medications    Prior to Admission medications   Medication Sig Start Date End Date Taking? Authorizing Provider  acetaminophen (TYLENOL) 500 MG tablet Take 500 mg by mouth every 6 (six) hours as needed for moderate pain or headache.     [provider]  aspirin 81 MG tablet Take 81 mg by mouth daily.    [provider]  brimonidine (ALPHAGAN P) 0.1 % SOLN Place 1 drop 3 (three) times daily into both eyes.    [provider]  cetirizine (ZYRTEC) 5 MG tablet Take 1 tablet (5 mg total) by mouth daily. 09/03/20   Jaynee Eagles, PA-C  cycloSPORINE, PF, (CEQUA) 0.09 % SOLN Apply 1 drop to eye 2 (two) times daily.    [provider]  docusate sodium (COLACE) 100 MG capsule Take 200 mg by mouth 2 (two) times daily.    [provider]  fluticasone (FLONASE) 50 MCG/ACT nasal spray Place 2 sprays into both nostrils daily. 09/03/20   Jaynee Eagles, PA-C  latanoprost (XALATAN) 0.005 % ophthalmic solution Place 1 drop into both eyes at bedtime.  09/20/12   [provider]  levETIRAcetam (KEPPRA) 500 MG tablet Take 1 tablet (500 mg total) by mouth 2 (two) times daily. 11/27/20   Marcial Pacas, MD  linaclotide [facility]) 290 MCG CAPS capsule Take 1 capsule (290 mcg total) by mouth daily before breakfast. 05/23/21   [name], MD  lisinopril (ZESTRIL) 20 MG tablet TAKE 1 TABLET(20 MG) BY MOUTH DAILY 03/21/21   [name], MD  magnesium hydroxide (MILK OF MAGNESIA) 400 MG/5ML suspension Take 30 mLs by mouth as needed for mild constipation.    [provider]  Multiple Vitamin (MULTIVITAMIN) tablet Take 1 tablet by mouth daily.    [provider]  nitrofurantoin,  macrocrystal-monohydrate, (MACROBID) 100 MG capsule Take 1 capsule (100 mg total) by mouth 2 (two) times daily. 04/18/21   [name], MD  RESTASIS 0.05 % ophthalmic emulsion 1 drop 2 (two) times daily. 12/03/20   [provider]  tamsulosin (FLOMAX) 0.4 MG CAPS capsule TAKE 1 CAPSULE(0.4 MG) BY MOUTH DAILY 06/12/21   [name], MD    Family History Family History  Problem Relation Age of Onset   Stroke Mother    High blood pressure Mother    Stroke Father    High blood pressure Father    Colon cancer Brother    Esophageal cancer Neg Hx    Pancreatic cancer Neg Hx    Rectal cancer Neg Hx    Stomach cancer Neg Hx     Social History Social History   Tobacco Use   Smoking status: Former    Packs/day: 1.00    Types: Cigarettes    Quit date: 02/18/2007    Years since quitting: 14.4   Smokeless tobacco: Never   Tobacco comments:    not sure but a while ago; quit smoking and ETOH  Vaping Use   Vaping Use: Never used  Substance Use Topics   Alcohol use: No    Alcohol/week: 0.0 standard drinks    Comment: quit in 2009   Drug use: No     Allergies   Patient has no known allergies.   Review of Systems Review of Systems Defer to HPI    Physical Exam Triage Vital Signs ED Triage Vitals  Enc Vitals Group     BP 07/13/21 1100 (!) 165/57     Pulse Rate 07/13/21 1100 (!) 59     Resp 07/13/21 1100 16     Temp 07/13/21 1100 98.1 F (36.7 C)     Temp Source 07/13/21 1100 Oral     SpO2 07/13/21 1100 100 %     Weight --      Height --      Head Circumference --      Peak Flow --      Pain Score 07/13/21 1101 7     Pain Loc --      Pain Edu? --      Excl. in [place]? --    No data found.  Updated Vital Signs BP (!) 165/57 (BP Location: Left Arm)   Pulse (!) 59   Temp 98.1 F (36.7 C) (Oral)   Resp 16   SpO2 100%   Visual Acuity Right Eye Distance:   Left Eye Distance:   Bilateral Distance:    Right Eye Near:   Left Eye Near:     Bilateral Near:     Physical Exam Constitutional:      Appearance: Normal appearance.  HENT:     Head: Normocephalic.  Right Ear: Tympanic membrane, ear canal and external ear normal.     Left Ear: Tympanic membrane, ear canal and external ear normal.     Nose: Nose normal.     Mouth/Throat:     Mouth: Mucous membranes are moist.     Pharynx: Oropharynx is clear.  Eyes:     Extraocular Movements: Extraocular movements intact.  Cardiovascular:     Rate and Rhythm: Normal rate and regular rhythm.     Pulses: Normal pulses.     Heart sounds: Normal heart sounds.  Pulmonary:     Effort: Pulmonary effort is normal.     Breath sounds: Normal breath sounds.  Musculoskeletal:     Cervical back: Normal range of motion and neck supple.     Comments: Unable to reproduce tenderness in the bilateral hands, no swelling, ecchymosis or deformity noted, range of motion is intact for all 10 metacarpals as well as the bilateral wrist, 2+ radial pulses bilaterally  Skin:    General: Skin is warm and dry.  Neurological:     Mental Status: He is alert and oriented to person, place, and time. Mental status is at baseline.  Psychiatric:        Mood and Affect: Mood normal.        Behavior: Behavior normal.     UC Treatments / Results  Labs (all labs ordered are listed, but only abnormal results are displayed) Labs Reviewed - No data to display  EKG   Radiology No results found.  Procedures Procedures (including critical care time)  Medications Ordered in UC Medications - No data to display  Initial Impression / Assessment and Plan / UC Course  I have reviewed the triage vital signs and the nursing notes.  Pertinent labs & imaging results that were available during my care of the patient were reviewed by me and considered in my medical decision making (see chart for details).  Pain of both hands Acute cough  Etiology of symptoms is most likely arthritic pain, discussed with  patient prescribe Diclegis gel as patient does not want to take additional oral medicine, may use Aleve 4 times daily, recommended heat over the affected area ice, rest and daily stretching as tolerated, may attempt oral NSAIDs as needed for additional comfort with follow-up with PCP as needed  Cough has been present for 3 to 4 weeks, vital signs are stable with O2 saturation of 100% and lungs are clear to auscultation, low suspicion for pneumonia, bronchitis or viral etiologies, prescribed Tessalon for outpatient management, patient may use over-the-counter Delsym as well, recommended humidifier at nighttime and continue use of Tylenol for chest soreness, may follow-up with PCP if symptoms continue to persist Final Clinical Impressions(s) / UC Diagnoses   Final diagnoses:  None   Discharge Instructions   None    ED Prescriptions   None    PDMP not reviewed this encounter.   Hans Eden, NP 07/13/21 1247

## 2021-07-13 NOTE — ED Triage Notes (Signed)
Pt states bilateral hand pain and swelling for the past 2-3 weeks. Also c/o  cough and runny nose.

## 2021-07-19 ENCOUNTER — Ambulatory Visit: Payer: Medicare Other | Admitting: Podiatry

## 2021-07-19 ENCOUNTER — Encounter: Payer: Self-pay | Admitting: Podiatry

## 2021-07-19 DIAGNOSIS — M79674 Pain in right toe(s): Secondary | ICD-10-CM

## 2021-07-19 DIAGNOSIS — B351 Tinea unguium: Secondary | ICD-10-CM | POA: Diagnosis not present

## 2021-07-19 DIAGNOSIS — M79675 Pain in left toe(s): Secondary | ICD-10-CM | POA: Diagnosis not present

## 2021-07-19 DIAGNOSIS — L84 Corns and callosities: Secondary | ICD-10-CM

## 2021-07-21 NOTE — Progress Notes (Signed)
Subjective:   Patient ID: Evan Escobar, male   DOB: 81 y.o.   MRN: [id number]   HPI Patient presents with 2 painful calluses on the left big toe second toe and also thick yellow brittle nailbeds 1-5 both feet that he cannot take care of   ROS      Objective:  Physical Exam  Neurovascular status unknown change with thick yellow brittle nailbeds 1-5 both feet that are moderately painful and keratotic lesion on the hallux and second toe left that are moderately tender when pressed     Assessment:  Chronic mycotic nail infection with pain 1-5 both feet along with lesions x2 left     Plan:  H&P reviewed condition and recommended current treatment.  I debrided nailbeds 1-5 both feet no iatrogenic bleeding debrided lesions left iatrogenic bleeding reappoint routine care

## 2021-07-30 ENCOUNTER — Ambulatory Visit (INDEPENDENT_AMBULATORY_CARE_PROVIDER_SITE_OTHER): Payer: Medicare Other | Admitting: Internal Medicine

## 2021-07-30 ENCOUNTER — Encounter: Payer: Self-pay | Admitting: Internal Medicine

## 2021-07-30 DIAGNOSIS — K5904 Chronic idiopathic constipation: Secondary | ICD-10-CM

## 2021-07-30 DIAGNOSIS — B351 Tinea unguium: Secondary | ICD-10-CM | POA: Diagnosis not present

## 2021-07-30 MED ORDER — POLYETHYLENE GLYCOL 3350 17 GM/SCOOP PO POWD: 17.0000 g | Freq: Two times a day (BID) | ORAL | 1 refills | Status: DC | PRN

## 2021-07-30 NOTE — Progress Notes (Signed)
   Subjective:   Patient ID: Evan Escobar, male    DOB: Sep 03, 1940, 81 y.o.   MRN: [id number]  HPI The patient is an 81 YO man coming in for follow up. Some questions.   Review of Systems  Constitutional: Negative.   HENT: Negative.    Eyes: Negative.   Respiratory:  Negative for cough, chest tightness and shortness of breath.   Cardiovascular:  Negative for chest pain, palpitations and leg swelling.  Gastrointestinal:  Positive for constipation. Negative for abdominal distention, abdominal pain, diarrhea, nausea and vomiting.  Musculoskeletal: Negative.   Skin:  Positive for color change.  Neurological: Negative.   Psychiatric/Behavioral: Negative.      Objective:  Physical Exam Constitutional:      Appearance: He is well-developed.  HENT:     Head: Normocephalic and atraumatic.  Cardiovascular:     Rate and Rhythm: Normal rate and regular rhythm.  Pulmonary:     Effort: Pulmonary effort is normal. No respiratory distress.     Breath sounds: Normal breath sounds. No wheezing or rales.  Abdominal:     General: Bowel sounds are normal. There is no distension.     Palpations: Abdomen is soft.     Tenderness: There is no abdominal tenderness. There is no rebound.  Musculoskeletal:     Cervical back: Normal range of motion.  Skin:    General: Skin is warm and dry.     Comments: Toenail problems  Neurological:     Mental Status: He is alert and oriented to person, place, and time.     Coordination: Coordination normal.     Vitals:   07/30/21 0848  BP: 122/64  Pulse: 61  Resp: 18  SpO2: 99%  Weight: 153 lb 9.6 oz (69.7 kg)  Height: '5\' 7"'$  (1.702 m)    Assessment & Plan:  Visit time 25 minutes in face to face communication with patient and coordination of care, additional 10 minutes spent in record review, coordination or care, ordering tests, communicating/referring to other healthcare professionals, documenting in medical records all on the same day of the visit for  total time 35 minutes spent on the visit.

## 2021-07-30 NOTE — Assessment & Plan Note (Signed)
Patient is concerned as he saw podiatry recently and they did not mention mycotic infection to him but put this in the note so he found out from [place]. We discussed typical course and possible medications risk/benefits and success rates. Overall advised not to proceed with treatment and he agrees.

## 2021-07-30 NOTE — Patient Instructions (Addendum)
Call [phone] for the GI doctor to schedule an appointment.  I want you to take fiber daily and add miralax 1 scoop daily. If you are not improving in 1-2 weeks increase mirlax to 1 scoop twice a day.

## 2021-07-30 NOTE — Assessment & Plan Note (Signed)
Given number for GI to call and schedule. Since he has not had relief with linzess 290 mcg daily we will stop. Start fiber daily and mirlax 1 scoop daily. In 1-2 weeks if no improvement go to 2 scoops miralax daily and keep taking fiber daily as well.

## 2021-08-26 ENCOUNTER — Telehealth: Payer: Medicare Other

## 2021-08-27 DIAGNOSIS — H348121 Central retinal vein occlusion, left eye, with retinal neovascularization: Secondary | ICD-10-CM | POA: Diagnosis not present

## 2021-08-27 DIAGNOSIS — H3562 Retinal hemorrhage, left eye: Secondary | ICD-10-CM | POA: Diagnosis not present

## 2021-08-27 DIAGNOSIS — H348312 Tributary (branch) retinal vein occlusion, right eye, stable: Secondary | ICD-10-CM | POA: Diagnosis not present

## 2021-08-27 DIAGNOSIS — H16223 Keratoconjunctivitis sicca, not specified as Sjogren's, bilateral: Secondary | ICD-10-CM | POA: Diagnosis not present

## 2021-08-27 DIAGNOSIS — H1131 Conjunctival hemorrhage, right eye: Secondary | ICD-10-CM | POA: Diagnosis not present

## 2021-08-27 DIAGNOSIS — H401133 Primary open-angle glaucoma, bilateral, severe stage: Secondary | ICD-10-CM | POA: Diagnosis not present

## 2021-09-17 ENCOUNTER — Ambulatory Visit: Payer: Medicare Other | Admitting: Nurse Practitioner

## 2021-09-17 ENCOUNTER — Encounter: Payer: Self-pay | Admitting: Nurse Practitioner

## 2021-09-17 VITALS — BP 134/70 | HR 64 | Ht 67.0 in | Wt 142.0 lb

## 2021-09-17 DIAGNOSIS — K5909 Other constipation: Secondary | ICD-10-CM

## 2021-09-17 MED ORDER — LUBIPROSTONE 24 MCG PO CAPS: 24.0000 ug | ORAL_CAPSULE | Freq: Two times a day (BID) | ORAL | 1 refills | Status: DC

## 2021-09-17 NOTE — Progress Notes (Signed)
Chief Complaint:  constipation   Assessment &  Plan   # 81 yo male with chronic constipation.  Recently has been starting, stopping, and combining several meds for constipation so it is difficult to sort things out.   Discussed that it is not necessary to have a bowel movement every day.  He does need to have at least 3-4 good bowel movements a week.  Stop Linzess Stop Miralax Continue daily fiber  For now try not to use the MOM Trial of Amitiza 24 mcg twice daily. If insurance doesn't cover it then let me know.   Need to drink at least 48 oz water daily Follow up with me in 4 weeks.   # History of adenomatous colon polyps, no polyps or cancers on last colonoscopy 2018.  No future surveillance exams planned due to age   HPI   Evan Escobar is a pleasant 81 y.o. male known to Dr.  Hilarie Fredrickson with a past medical history significant for adenomatous colon polyps, chronic constipation, epilepsy, hypertension, chronic anemia, and glaucoma.   See PMH /PSH for additional history.   Evan Escobar was last seen here in 2021 for evaluation of constipation.  At that time he had never tried fiber so twice daily Benefiber was added.  He hasn't been seen since.   Patient saw his PCP in June 2023 . According to office note he was taking Linzess 290 mcg but it wasn't working for him and so it was discontinued and he was started on miralax 1 scoop daily. In 1-2 weeks if no improvement he was to go to 2 scoops daily and also continue the fiber.   Interval History:  Unclear what happened but patient has changed his bowel regimen around since he saw his PCP.  At various times he has been taking a combination of MiraLAX, fiber, milk of magnesia and recently resumed Linzess to 290 mcg.  Colace is on home med list.  He tried it a few times but currently not taking it. He is having 3-4 bowel movements a week but says that they are not adequate volume, no blood in stools. He drinks at least 3 bottles of water a  day  Previous GI Evaluation   December 2018 colonoscopy for polyp surveillance --complete exam with good prep December 2018. Entire colon was normal.    Labs:     Latest Ref Rng & Units 04/16/2021    8:54 AM 08/13/2020   10:24 AM 10/13/2019    3:32 PM  CBC  WBC 4.0 - 10.5 K/uL 3.0  6.0  4.1   Hemoglobin 13.0 - 17.0 g/dL 11.7  11.1  11.7   Hematocrit 39.0 - 52.0 % 35.5  33.3  35.8   Platelets 150.0 - 400.0 K/uL 151.0  146.0  159        Latest Ref Rng & Units 04/16/2021    8:54 AM 08/13/2020   10:24 AM 10/13/2019    3:32 PM  Hepatic Function  Total Protein 6.0 - 8.3 g/dL 7.2  7.1  7.1   Albumin 3.5 - 5.2 g/dL 4.2  4.2    AST 0 - 37 U/L '17  16  15   ' ALT 0 - 53 U/L '10  9  7   ' Alk Phosphatase 39 - 117 U/L 27  24    Total Bilirubin 0.2 - 1.2 mg/dL 0.4  0.5  0.4   Bilirubin, Direct 0.0 - 0.2 mg/dL   0.1      Past  Medical History:  Diagnosis Date   Allergy    Arthritis    Cataract    Glaucoma    Hypertension    Normocytic anemia 09/25/2012   Polyp, sigmoid colon 11/05/2011   Seizure ([place])    Seizures ([place]) 03/17/2013    Past Surgical History:  Procedure Laterality Date   ANTERIOR CERVICAL DECOMP/DISCECTOMY FUSION N/A 05/22/2017   Procedure: Anterior Cervical Discectomy Fusion - Cervical Three-Cervical Four - Cervical Four-Cervical Five;  Surgeon: Kary Kos, MD;  Location: [facility];  Service: Neurosurgery;  Laterality: N/A;   CATARACT EXTRACTION, BILATERAL     EYE SURGERY     GLAUCOMA REPAIR  10 yrs ago    Current Medications, Allergies, Family History and Social History were reviewed in [employer] record.     Current Outpatient Medications  Medication Sig Dispense Refill   acetaminophen (TYLENOL) 500 MG tablet Take 500 mg by mouth every 6 (six) hours as needed for moderate pain or headache.      aspirin 81 MG tablet Take 81 mg by mouth daily.     benzonatate (TESSALON) 100 MG capsule Take 1 capsule (100 mg total) by mouth every 8 (eight) hours. 40  capsule 0   brimonidine (ALPHAGAN P) 0.1 % SOLN Place 1 drop 3 (three) times daily into both eyes.     cetirizine (ZYRTEC) 5 MG tablet Take 1 tablet (5 mg total) by mouth daily. 90 tablet 0   cycloSPORINE, PF, (CEQUA) 0.09 % SOLN Apply 1 drop to eye 2 (two) times daily.     diclofenac Sodium (VOLTAREN) 1 % GEL Apply 2 g topically 4 (four) times daily. 50 g 1   docusate sodium (COLACE) 100 MG capsule Take 200 mg by mouth 2 (two) times daily.     fluticasone (FLONASE) 50 MCG/ACT nasal spray Place 2 sprays into both nostrils daily. 16 g 12   latanoprost (XALATAN) 0.005 % ophthalmic solution Place 1 drop into both eyes at bedtime.      levETIRAcetam (KEPPRA) 500 MG tablet Take 1 tablet (500 mg total) by mouth 2 (two) times daily. 180 tablet 4   lisinopril (ZESTRIL) 20 MG tablet TAKE 1 TABLET(20 MG) BY MOUTH DAILY 90 tablet 3   magnesium hydroxide (MILK OF MAGNESIA) 400 MG/5ML suspension Take 30 mLs by mouth as needed for mild constipation.     Multiple Vitamin (MULTIVITAMIN) tablet Take 1 tablet by mouth daily.     nitrofurantoin, macrocrystal-monohydrate, (MACROBID) 100 MG capsule Take 1 capsule (100 mg total) by mouth 2 (two) times daily. 14 capsule 0   polyethylene glycol powder (GLYCOLAX/MIRALAX) 17 GM/SCOOP powder Take 17 g by mouth 2 (two) times daily as needed. 3350 g 1   RESTASIS 0.05 % ophthalmic emulsion 1 drop 2 (two) times daily.     tamsulosin (FLOMAX) 0.4 MG CAPS capsule TAKE 1 CAPSULE(0.4 MG) BY MOUTH DAILY 90 capsule 0   No current facility-administered medications for this visit.    Review of Systems: No chest pain. No shortness of breath. No urinary complaints.    Physical Exam  Wt Readings from Last 3 Encounters:  07/30/21 153 lb 9.6 oz (69.7 kg)  04/16/21 147 lb (66.7 kg)  11/27/20 143 lb (64.9 kg)    BP 134/70   Pulse 64   Ht '5\' 7"'  (1.702 m)   Wt 142 lb (64.4 kg)   SpO2 98%   BMI 22.24 kg/m  Constitutional:  Generally well appearing male in no acute  distress. Psychiatric: Pleasant. Normal  mood and affect. Behavior is normal. EENT: Pupils normal.  Conjunctivae are normal. No scleral icterus. Neck supple.  Cardiovascular: Normal rate, regular rhythm. No edema Pulmonary/chest: Effort normal and breath sounds normal. No wheezing, rales or rhonchi. Abdominal: Soft, nondistended, nontender. Bowel sounds active throughout. There are no masses palpable. No hepatomegaly. Neurological: Alert and oriented to person place and time. Skin: Skin is warm and dry. No rashes noted.  Evan Savoy, NP  09/17/2021, 8:30 AM  Cc:  [name], *

## 2021-09-17 NOTE — Patient Instructions (Addendum)
If you are age 80 or older, your body mass index should be between 23-30. Your Body mass index is 22.24 kg/m. If this is out of the aforementioned range listed, please consider follow up with your Primary Care Provider. ________________________________________________________  The [facility] providers would like to encourage you to use [facility] to communicate with providers for non-urgent requests or questions.  Due to long hold times on the telephone, sending your provider a message by [facility] may be a faster and more efficient way to get a response.  Please allow 48 business hours for a response.  Please remember that this is for non-urgent requests.  _______________________________________________________  For constipation Stop Linzess Stop Miralax For now, try not to use the Milk of Magnesia Continue daily fiber  Trial of Amitiza 24 mcg twice daily. If insurance doesn't cover it then let me know.  Need to drink at least 48 oz water daily  You have been scheduled to follow up with Tye Savoy, NP-C on October 22, 2021 at 9:00 am  Thank you for entrusting me with your care and choosing [facility].  Tye Savoy, NP-C

## 2021-09-17 NOTE — Progress Notes (Signed)
Addendum: Reviewed and agree with assessment and management plan. Myosha Cuadras M, MD  

## 2021-09-18 ENCOUNTER — Other Ambulatory Visit: Payer: Self-pay | Admitting: Internal Medicine

## 2021-10-02 ENCOUNTER — Ambulatory Visit (INDEPENDENT_AMBULATORY_CARE_PROVIDER_SITE_OTHER): Payer: Medicare Other | Admitting: Internal Medicine

## 2021-10-02 ENCOUNTER — Ambulatory Visit (INDEPENDENT_AMBULATORY_CARE_PROVIDER_SITE_OTHER): Payer: Medicare Other

## 2021-10-02 ENCOUNTER — Encounter: Payer: Self-pay | Admitting: Internal Medicine

## 2021-10-02 VITALS — BP 126/76 | HR 59 | Temp 98.4°F | Ht 67.0 in | Wt 143.0 lb

## 2021-10-02 DIAGNOSIS — K5904 Chronic idiopathic constipation: Secondary | ICD-10-CM | POA: Diagnosis not present

## 2021-10-02 DIAGNOSIS — R350 Frequency of micturition: Secondary | ICD-10-CM | POA: Diagnosis not present

## 2021-10-02 DIAGNOSIS — M79641 Pain in right hand: Secondary | ICD-10-CM

## 2021-10-02 DIAGNOSIS — R569 Unspecified convulsions: Secondary | ICD-10-CM

## 2021-10-02 DIAGNOSIS — N401 Enlarged prostate with lower urinary tract symptoms: Secondary | ICD-10-CM | POA: Diagnosis not present

## 2021-10-02 DIAGNOSIS — Z0001 Encounter for general adult medical examination with abnormal findings: Secondary | ICD-10-CM

## 2021-10-02 NOTE — Patient Instructions (Addendum)
Try taking magnesium over the counter to help the muscle cramps and stay well hydrated.  We are checking the x-ray of the hand today.

## 2021-10-02 NOTE — Progress Notes (Signed)
Subjective:   Patient ID: Evan Escobar, male    DOB: 1940-02-27, 81 y.o.   MRN: [id number]  HPI Here for medicare wellness and physical, with new complaints. Please see A/P for status and treatment of chronic medical problems.   Diet: heart healthy Physical activity: walks daily Depression/mood screen: negative Hearing: intact to whispered voice, hearing aids, does not wear all the time Visual acuity: grossly normal, performs annual eye exam  ADLs: capable Fall risk: none Home safety: good Cognitive evaluation: intact to orientation, naming, recall and repetition EOL planning: adv directives discussed  [place] Visit from 10/02/2021 in [place] at [facility]  PHQ-2 Total Score 1       [place] Office Visit from 10/02/2021 in [place] at [facility]  PHQ-9 Total Score 3         08/13/2020   10:03 AM 09/03/2020    8:54 AM 07/13/2021   11:02 AM 07/30/2021    8:53 AM 10/02/2021    8:09 AM  Fall Risk  Falls in the past year? 0   0 0  Was there an injury with Fall? 0   0 0  Fall Risk Category Calculator 0   0 0  Fall Risk Category Low   Low Low  Patient Fall Risk Level  Low fall risk Low fall risk  Low fall risk  Patient at Risk for Falls Due to     No Fall Risks  Fall risk Follow up     Falls evaluation completed    I have personally reviewed and have noted 1. The patient's medical and social history - reviewed today no changes 2. Their use of alcohol, tobacco or illicit drugs 3. Their current medications and supplements 4. The patient's functional ability including ADL's, fall risks, home safety risks and hearing or visual impairment. 5. Diet and physical activities 6. Evidence for depression or mood disorders 7. Care team reviewed and updated 8.  The patient is not on an opioid pain medication.  Patient Care Team: [name], MD as PCP - General (Internal Medicine) Marcial Pacas, MD as Consulting Physician  (Neurology) Regal, Tamala Fothergill, DPM as Consulting Physician (Podiatry) Charlton Haws, [facility] as Pharmacist (Pharmacist) Past Medical History:  Diagnosis Date   Allergy    Arthritis    Cataract    Glaucoma    Hypertension    Normocytic anemia 09/25/2012   Polyp, sigmoid colon 11/05/2011   Seizure ([place])    Seizures ([place]) 03/17/2013   Past Surgical History:  Procedure Laterality Date   ANTERIOR CERVICAL DECOMP/DISCECTOMY FUSION N/A 05/22/2017   Procedure: Anterior Cervical Discectomy Fusion - Cervical Three-Cervical Four - Cervical Four-Cervical Five;  Surgeon: Kary Kos, MD;  Location: [facility];  Service: Neurosurgery;  Laterality: N/A;   CATARACT EXTRACTION, BILATERAL     EYE SURGERY     GLAUCOMA REPAIR  10 yrs ago   Family History  Problem Relation Age of Onset   Stroke Mother    High blood pressure Mother    Stroke Father    High blood pressure Father    Colon cancer Brother    Esophageal cancer Neg Hx    Pancreatic cancer Neg Hx    Rectal cancer Neg Hx    Stomach cancer Neg Hx    Review of Systems  Constitutional: Negative.   HENT: Negative.    Eyes: Negative.   Respiratory:  Negative for cough, chest tightness and shortness of breath.   Cardiovascular:  Negative for chest pain, palpitations and leg swelling.  Gastrointestinal:  Negative for abdominal distention, abdominal pain, constipation, diarrhea, nausea and vomiting.  Musculoskeletal:  Positive for arthralgias, joint swelling and myalgias.  Skin: Negative.   Neurological: Negative.   Psychiatric/Behavioral: Negative.      Objective:  Physical Exam Constitutional:      Appearance: He is well-developed.  HENT:     Head: Normocephalic and atraumatic.  Cardiovascular:     Rate and Rhythm: Normal rate and regular rhythm.  Pulmonary:     Effort: Pulmonary effort is normal. No respiratory distress.     Breath sounds: Normal breath sounds. No wheezing or rales.  Abdominal:     General: Bowel sounds are normal.  There is no distension.     Palpations: Abdomen is soft.     Tenderness: There is no abdominal tenderness. There is no rebound.  Musculoskeletal:        General: Swelling and tenderness present.     Cervical back: Normal range of motion.  Skin:    General: Skin is warm and dry.  Neurological:     Mental Status: He is alert and oriented to person, place, and time.     Coordination: Coordination normal.     Vitals:   10/02/21 0800  BP: 126/76  Pulse: (!) 59  Temp: 98.4 F (36.9 C)  TempSrc: Oral  SpO2: 99%  Weight: 143 lb (64.9 kg)  Height: '5\' 7"'$  (1.702 m)    Assessment & Plan:

## 2021-10-04 NOTE — Assessment & Plan Note (Signed)
After injury about 3 months ago. No imaging done thus far. Will check x-ray hand to rule out progressive arthritis versus fracture. Treat as appropriate.

## 2021-10-04 NOTE — Assessment & Plan Note (Signed)
Flu shot yearly. Covid-19 counseled. Pneumonia complete. Shingrix advised to get at pharmacy. Tetanus due 2026. Colonoscopy aged out. Counseled about sun safety and mole surveillance. Counseled about the dangers of distracted driving. Given 10 year screening recommendations.

## 2021-10-04 NOTE — Assessment & Plan Note (Signed)
Has tried Netherlands and this is helping better although he is now waking up at night having to defecate.

## 2021-10-04 NOTE — Assessment & Plan Note (Signed)
Taking flomax 0.4 mg qhs and overall satisfied with control.

## 2021-10-04 NOTE — Assessment & Plan Note (Signed)
Taking keppra 500 mg BID and no seizure activity. Will continue. Labs up to date none needed today.

## 2021-10-14 ENCOUNTER — Telehealth: Payer: Self-pay | Admitting: Internal Medicine

## 2021-10-15 ENCOUNTER — Telehealth: Payer: Self-pay

## 2021-10-15 DIAGNOSIS — M79641 Pain in right hand: Secondary | ICD-10-CM

## 2021-10-15 NOTE — Telephone Encounter (Signed)
As advised after office visit recently his pain is likely from arthritis. What is he using for pain lately? We can send him to a hand specialist but they likely will try medication first as well. Tylenol is safest to try for pain first up to 1000 mg TID.

## 2021-10-15 NOTE — Telephone Encounter (Signed)
Pt is requesting a referral for his Rt hand. It is still causing pain and swollen.  Pt went to UC and they states it was arthritis.   Please advise

## 2021-10-16 NOTE — Telephone Encounter (Signed)
Pt states that he has been taking Tylenol BID '100mg'$  and that is not helping at all with the pain and is requesting to still see a specialist and get something for pain.  Please advise

## 2021-10-17 ENCOUNTER — Encounter: Payer: Self-pay | Admitting: Podiatry

## 2021-10-17 ENCOUNTER — Ambulatory Visit: Payer: Medicare Other | Admitting: Podiatry

## 2021-10-17 DIAGNOSIS — M79674 Pain in right toe(s): Secondary | ICD-10-CM

## 2021-10-17 DIAGNOSIS — M79675 Pain in left toe(s): Secondary | ICD-10-CM

## 2021-10-17 DIAGNOSIS — B351 Tinea unguium: Secondary | ICD-10-CM

## 2021-10-17 NOTE — Telephone Encounter (Signed)
Referral done. I would recommend for pain voltaren gel (available otc) as next best option.

## 2021-10-17 NOTE — Telephone Encounter (Signed)
LVM for pt of Dr. Nathanial Millman advice.

## 2021-10-17 NOTE — Progress Notes (Signed)
Subjective:   Patient ID: Evan Escobar, male   DOB: 81 y.o.   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

## 2021-10-22 ENCOUNTER — Encounter: Payer: Self-pay | Admitting: Nurse Practitioner

## 2021-10-22 ENCOUNTER — Ambulatory Visit: Payer: Medicare Other | Admitting: Sports Medicine

## 2021-10-22 ENCOUNTER — Ambulatory Visit: Payer: Medicare Other | Admitting: Nurse Practitioner

## 2021-10-22 VITALS — BP 130/72 | HR 65 | Ht 67.0 in | Wt 142.0 lb

## 2021-10-22 DIAGNOSIS — K5909 Other constipation: Secondary | ICD-10-CM

## 2021-10-22 MED ORDER — LUBIPROSTONE 24 MCG PO CAPS: 24.0000 ug | ORAL_CAPSULE | Freq: Two times a day (BID) | ORAL | 1 refills | Status: AC

## 2021-10-22 NOTE — Progress Notes (Signed)
Chief Complaint: Follow-up on constipation   Assessment &  Plan   # 81 yo male with chronic constipation. Overall doing well on Amitiza 24 mcg BID , daily fiber and good hydration.  Will refill Amitiza 24 mcg twice daily, 6 refills Continue daily fiber Drink at least 60 ounces of water daily Follow-up as needed  # Chronic [place] anemia ( present for years). Stable  # History of adenomatous colon polyps, no polyps or cancers on last colonoscopy 2018.  No future surveillance exams planned due to age  HPI   Evan Escobar is a 81 y.o. male known to Dr.  Hilarie Fredrickson with a past medical history significant for adenomatous colon polyps, chronic constipation, epilepsy, hypertension, chronic anemia, and glaucoma.. See PMH /PSH for additional history   Evaluated here on 09/17/21 for constipation please refer to that office note for details.  Briefly, he had been on starting and stopping multiple medications for constipation.  Despite his efforts he was having only 3-4 small volume bowel movements a week .  He was started on Amitiza 24 mcg daily, asked to continue fiber and good hydration    Interval History:  Amitiza worked well for the most part.  He has also continued to take daily fiber and stay hydrated.  He went to get a refill on Amitiza but they only gave him enough pills to last until this visit.  He is not having any blood in his stool.  Overall on Amitiza he was having a bowel movement every day.    Previous GI Evaluation   December 2018 colonoscopy for polyp surveillance --complete exam with good prep December 2018. Entire colon was normal   Labs:     Latest Ref Rng & Units 04/16/2021    8:54 AM 08/13/2020   10:24 AM 10/13/2019    3:32 PM  CBC  WBC 4.0 - 10.5 K/uL 3.0  6.0  4.1   Hemoglobin 13.0 - 17.0 g/dL 11.7  11.1  11.7   Hematocrit 39.0 - 52.0 % 35.5  33.3  35.8   Platelets 150.0 - 400.0 K/uL 151.0  146.0  159        Latest Ref Rng & Units 04/16/2021    8:54 AM 08/13/2020    10:24 AM 10/13/2019    3:32 PM  Hepatic Function  Total Protein 6.0 - 8.3 g/dL 7.2  7.1  7.1   Albumin 3.5 - 5.2 g/dL 4.2  4.2    AST 0 - 37 U/L '17  16  15   ' ALT 0 - 53 U/L '10  9  7   ' Alk Phosphatase 39 - 117 U/L 27  24    Total Bilirubin 0.2 - 1.2 mg/dL 0.4  0.5  0.4   Bilirubin, Direct 0.0 - 0.2 mg/dL   0.1      Past Medical History:  Diagnosis Date   Allergy    Arthritis    Cataract    Glaucoma    Hypertension    Normocytic anemia 09/25/2012   Polyp, sigmoid colon 11/05/2011   Seizure ([place])    Seizures ([place]) 03/17/2013    Past Surgical History:  Procedure Laterality Date   ANTERIOR CERVICAL DECOMP/DISCECTOMY FUSION N/A 05/22/2017   Procedure: Anterior Cervical Discectomy Fusion - Cervical Three-Cervical Four - Cervical Four-Cervical Five;  Surgeon: Kary Kos, MD;  Location: [facility];  Service: Neurosurgery;  Laterality: N/A;   CATARACT EXTRACTION, BILATERAL     EYE SURGERY     GLAUCOMA REPAIR  10 yrs ago    Current Medications, Allergies, Family History and Social History were reviewed in [facility] record.     Current Outpatient Medications  Medication Sig Dispense Refill   acetaminophen (TYLENOL) 500 MG tablet Take 500 mg by mouth every 6 (six) hours as needed for moderate pain or headache.      aspirin 81 MG tablet Take 81 mg by mouth daily.     benzonatate (TESSALON) 100 MG capsule Take 1 capsule (100 mg total) by mouth every 8 (eight) hours. 40 capsule 0   brimonidine (ALPHAGAN P) 0.1 % SOLN Place 1 drop 3 (three) times daily into both eyes.     cetirizine (ZYRTEC) 5 MG tablet Take 1 tablet (5 mg total) by mouth daily. 90 tablet 0   cycloSPORINE, PF, (CEQUA) 0.09 % SOLN Apply 1 drop to eye 2 (two) times daily.     diclofenac Sodium (VOLTAREN) 1 % GEL Apply 2 g topically 4 (four) times daily. (Patient not taking: Reported on 10/02/2021) 50 g 1   fluticasone (FLONASE) 50 MCG/ACT nasal spray Place 2 sprays into both nostrils daily. (Patient not  taking: Reported on 10/02/2021) 16 g 12   latanoprost (XALATAN) 0.005 % ophthalmic solution Place 1 drop into both eyes at bedtime.  (Patient not taking: Reported on 10/02/2021)     levETIRAcetam (KEPPRA) 500 MG tablet Take 1 tablet (500 mg total) by mouth 2 (two) times daily. 180 tablet 4   lisinopril (ZESTRIL) 20 MG tablet TAKE 1 TABLET(20 MG) BY MOUTH DAILY 90 tablet 3   lubiprostone (AMITIZA) 24 MCG capsule Take 1 capsule (24 mcg total) by mouth 2 (two) times daily with a meal. 60 capsule 1   magnesium hydroxide (MILK OF MAGNESIA) 400 MG/5ML suspension Take 30 mLs by mouth as needed for mild constipation.     Multiple Vitamin (MULTIVITAMIN) tablet Take 1 tablet by mouth daily.     polyethylene glycol powder (GLYCOLAX/MIRALAX) 17 GM/SCOOP powder Take 17 g by mouth 2 (two) times daily as needed. (Patient not taking: Reported on 10/02/2021) 3350 g 1   RESTASIS 0.05 % ophthalmic emulsion 1 drop 2 (two) times daily.     tamsulosin (FLOMAX) 0.4 MG CAPS capsule TAKE 1 CAPSULE(0.4 MG) BY MOUTH DAILY 90 capsule 0   No current facility-administered medications for this visit.    Review of Systems: No chest pain. No shortness of breath. No urinary complaints.    Physical Exam  Wt Readings from Last 3 Encounters:  10/22/21 142 lb (64.4 kg)  10/02/21 143 lb (64.9 kg)  09/17/21 142 lb (64.4 kg)    Ht '5\' 7"'  (1.702 m)   Wt 142 lb (64.4 kg)   BMI 22.24 kg/m  Constitutional:  Generally well appearing male in no acute distress. Psychiatric: Pleasant. Normal mood and affect. Behavior is normal. EENT: Pupils normal.  Conjunctivae are normal. No scleral icterus. Neck supple.  Cardiovascular: Normal rate, regular rhythm. No edema Pulmonary/chest: Effort normal and breath sounds normal. No wheezing, rales or rhonchi. Abdominal: Soft, nondistended, nontender. Bowel sounds active throughout. There are no masses palpable. No hepatomegaly. Neurological: Alert and oriented to person place and time. Skin:  Skin is warm and dry. No rashes noted.  Tye Savoy, NP  10/22/2021, 8:49 AM        .

## 2021-10-22 NOTE — Progress Notes (Unsigned)
    Benito Mccreedy D.[facility] Phone: [phone]   Assessment and Plan:     There are no diagnoses linked to this encounter.  ***   Pertinent previous records reviewed include ***   Follow Up: ***     Subjective:   I, Ural Acree, am serving as a Education administrator for Doctor Glennon Mac  Chief Complaint: right hand pain   HPI:   10/23/2021 Patient is a 81 year old male complaining of right hand pain. Patient states   Relevant Historical Information: ***  Additional pertinent review of systems negative.   Current Outpatient Medications:    acetaminophen (TYLENOL) 500 MG tablet, Take 500 mg by mouth every 6 (six) hours as needed for moderate pain or headache. , Disp: , Rfl:    aspirin 81 MG tablet, Take 81 mg by mouth daily., Disp: , Rfl:    benzonatate (TESSALON) 100 MG capsule, Take 1 capsule (100 mg total) by mouth every 8 (eight) hours., Disp: 40 capsule, Rfl: 0   brimonidine (ALPHAGAN P) 0.1 % SOLN, Place 1 drop 3 (three) times daily into both eyes., Disp: , Rfl:    cetirizine (ZYRTEC) 5 MG tablet, Take 1 tablet (5 mg total) by mouth daily., Disp: 90 tablet, Rfl: 0   cycloSPORINE, PF, (CEQUA) 0.09 % SOLN, Apply 1 drop to eye 2 (two) times daily., Disp: , Rfl:    diclofenac Sodium (VOLTAREN) 1 % GEL, Apply 2 g topically 4 (four) times daily. (Patient not taking: Reported on 10/02/2021), Disp: 50 g, Rfl: 1   fluticasone (FLONASE) 50 MCG/ACT nasal spray, Place 2 sprays into both nostrils daily. (Patient not taking: Reported on 10/02/2021), Disp: 16 g, Rfl: 12   latanoprost (XALATAN) 0.005 % ophthalmic solution, Place 1 drop into both eyes at bedtime.  (Patient not taking: Reported on 10/02/2021), Disp: , Rfl:    levETIRAcetam (KEPPRA) 500 MG tablet, Take 1 tablet (500 mg total) by mouth 2 (two) times daily., Disp: 180 tablet, Rfl: 4   lisinopril (ZESTRIL) 20 MG tablet, TAKE 1 TABLET(20 MG) BY MOUTH DAILY,  Disp: 90 tablet, Rfl: 3   lubiprostone (AMITIZA) 24 MCG capsule, Take 1 capsule (24 mcg total) by mouth 2 (two) times daily with a meal., Disp: 60 capsule, Rfl: 1   magnesium hydroxide (MILK OF MAGNESIA) 400 MG/5ML suspension, Take 30 mLs by mouth as needed for mild constipation., Disp: , Rfl:    Multiple Vitamin (MULTIVITAMIN) tablet, Take 1 tablet by mouth daily., Disp: , Rfl:    polyethylene glycol powder (GLYCOLAX/MIRALAX) 17 GM/SCOOP powder, Take 17 g by mouth 2 (two) times daily as needed. (Patient not taking: Reported on 10/02/2021), Disp: 3350 g, Rfl: 1   RESTASIS 0.05 % ophthalmic emulsion, 1 drop 2 (two) times daily., Disp: , Rfl:    tamsulosin (FLOMAX) 0.4 MG CAPS capsule, TAKE 1 CAPSULE(0.4 MG) BY MOUTH DAILY, Disp: 90 capsule, Rfl: 0   Objective:     There were no vitals filed for this visit.    There is no height or weight on file to calculate BMI.    Physical Exam:    ***   Electronically signed by:  Benito Mccreedy D.[facility] 7:51 AM 10/22/21

## 2021-10-22 NOTE — Patient Instructions (Addendum)
Continue daily fiber.  Continue Amitiza twice daily Please drink 60 oz of water a day Call us if having recurrent constipation  _______________________________________________________  If you are age 80 or older, your body mass index should be between 23-30. Your Body mass index is 22.24 kg/m. If this is out of the aforementioned range listed, please consider follow up with your Primary Care Provider.  If you are age 70 or younger, your body mass index should be between 19-25. Your Body mass index is 22.24 kg/m. If this is out of the aformentioned range listed, please consider follow up with your Primary Care Provider.   ________________________________________________________  The [facility] providers would like to encourage you to use [facility] to communicate with providers for non-urgent requests or questions.  Due to long hold times on the telephone, sending your provider a message by [facility] may be a faster and more efficient way to get a response.  Please allow 48 business hours for a response.  Please remember that this is for non-urgent requests.  _______________________________________________________

## 2021-10-23 ENCOUNTER — Ambulatory Visit (INDEPENDENT_AMBULATORY_CARE_PROVIDER_SITE_OTHER): Payer: Medicare Other | Admitting: Sports Medicine

## 2021-10-23 VITALS — BP 110/83 | HR 61 | Ht 67.0 in | Wt 142.0 lb

## 2021-10-23 DIAGNOSIS — M24541 Contracture, right hand: Secondary | ICD-10-CM | POA: Diagnosis not present

## 2021-10-23 NOTE — Patient Instructions (Addendum)
Good to see you Voltaren gel over ring finger and palm  Hand HEP  Recommend rolling up a towel to hold at night to prevent stiffness Tylenol as needed  Follow up as needed

## 2021-10-28 NOTE — Progress Notes (Signed)
Addendum: Reviewed and agree with assessment and management plan. Makenzie Vittorio M, MD  

## 2021-11-11 ENCOUNTER — Telehealth: Payer: Self-pay | Admitting: Nurse Practitioner

## 2021-11-11 NOTE — Telephone Encounter (Signed)
Inbound call from patient states the rx he's taking lubiprostone is no longer working for him. Patient is requesting a call back.

## 2021-11-11 NOTE — Telephone Encounter (Signed)
Left message for pt to call back  °

## 2021-11-11 NOTE — Telephone Encounter (Addendum)
Patient returned your call. Requesting a call back as soon as possible. States you should call him on his home phone.

## 2021-11-11 NOTE — Telephone Encounter (Signed)
Pt states that he hasn't had a bowel movement in about 2 days. Pt states that when he started [place] he was having 2 BMs per day. Pt confirmed he is taking Amitiza BID and taking fiber. Asked pt if he was drinking plenty of water and pt thought that he might not be drinking enough water. Pt denies abd pain but states he has pain in his lower back that usually happens when he is constipated. Pt unsure about how many bowel movements he is having per week, pt states he thinks he goes every couple days but is unable to have a complete bowel movement.

## 2021-11-14 NOTE — Telephone Encounter (Signed)
Gave pt recommendations. Pt verbalized understanding and had no other concerns at end of call.  

## 2021-11-14 NOTE — Telephone Encounter (Signed)
Called pt's cell and home phone number. Left voicemail for pt to call back.

## 2021-11-20 ENCOUNTER — Telehealth: Payer: Self-pay | Admitting: Internal Medicine

## 2021-11-20 DIAGNOSIS — M5416 Radiculopathy, lumbar region: Secondary | ICD-10-CM

## 2021-11-20 NOTE — Telephone Encounter (Signed)
Pt stated--had injection before on the left leg/left side back --pain- to imaging place. Pt requesting referral. Please advise

## 2021-11-20 NOTE — Telephone Encounter (Signed)
Patient wants to know if you can send him for shots in his back.    Please advise.

## 2021-11-21 NOTE — Telephone Encounter (Signed)
We will get him in with back specialist they can do the shots. Referral done.

## 2021-11-21 NOTE — Telephone Encounter (Signed)
Pt notified regarding referral.

## 2021-11-22 NOTE — Telephone Encounter (Signed)
Left message for pt to call back  °

## 2021-11-22 NOTE — Telephone Encounter (Signed)
Inbound call from patient stating the medication he was previously prescribed is not working and he would like to be prescribed milk of magnesia? Patient states he is feeling weak and is in pain . Please give a call to further advise.  Thank you

## 2021-11-22 NOTE — Telephone Encounter (Signed)
MOM is [facility] and could be purchased from local pharmacy Other option would be  OTC Mag citrate x 1 bottle OR 2. MiraLax 8 doses in 32 oz of gatorade drank over 2-3 hours as a purge  Resume amitiza thereafter Thanks [employer]

## 2021-11-22 NOTE — Telephone Encounter (Signed)
Pt reports he hasn't had a good bowel movement in over a week. Pt requesting a prescription for milk of magnesia. Pt reports he tried adding miralax with his Amitiza and it is not helping.

## 2021-11-25 NOTE — Telephone Encounter (Signed)
Lm on mobile vm for patient to return call °

## 2021-11-26 NOTE — Telephone Encounter (Signed)
Called pt and gave pt recommendations. Pt verbalized understanding and stated he would use mag citrate.

## 2021-12-03 DIAGNOSIS — M4316 Spondylolisthesis, lumbar region: Secondary | ICD-10-CM | POA: Diagnosis not present

## 2021-12-07 ENCOUNTER — Other Ambulatory Visit: Payer: Self-pay | Admitting: Neurology

## 2021-12-10 DIAGNOSIS — M5126 Other intervertebral disc displacement, lumbar region: Secondary | ICD-10-CM | POA: Diagnosis not present

## 2021-12-10 DIAGNOSIS — M4316 Spondylolisthesis, lumbar region: Secondary | ICD-10-CM | POA: Diagnosis not present

## 2021-12-12 DIAGNOSIS — M5126 Other intervertebral disc displacement, lumbar region: Secondary | ICD-10-CM | POA: Diagnosis not present

## 2021-12-12 DIAGNOSIS — M4316 Spondylolisthesis, lumbar region: Secondary | ICD-10-CM | POA: Diagnosis not present

## 2021-12-16 ENCOUNTER — Other Ambulatory Visit: Payer: Self-pay | Admitting: Neurosurgery

## 2021-12-16 DIAGNOSIS — M5126 Other intervertebral disc displacement, lumbar region: Secondary | ICD-10-CM

## 2021-12-20 ENCOUNTER — Ambulatory Visit
Admission: RE | Admit: 2021-12-20 | Discharge: 2021-12-20 | Disposition: A | Discharge status: Home / Self Care | Payer: Medicare Other | Source: Ambulatory Visit | Attending: Neurosurgery | Admitting: Neurosurgery

## 2021-12-20 DIAGNOSIS — M5126 Other intervertebral disc displacement, lumbar region: Secondary | ICD-10-CM

## 2021-12-20 DIAGNOSIS — M47817 Spondylosis without myelopathy or radiculopathy, lumbosacral region: Secondary | ICD-10-CM | POA: Diagnosis not present

## 2021-12-20 DIAGNOSIS — M48061 Spinal stenosis, lumbar region without neurogenic claudication: Secondary | ICD-10-CM | POA: Diagnosis not present

## 2021-12-20 DIAGNOSIS — M5116 Intervertebral disc disorders with radiculopathy, lumbar region: Secondary | ICD-10-CM | POA: Diagnosis not present

## 2021-12-20 MED ORDER — IOPAMIDOL (ISOVUE-M 200) INJECTION 41%
1.0000 mL | Freq: Once | INTRAMUSCULAR | Status: AC
  Administered 2021-12-20: 1 mL via EPIDURAL

## 2021-12-20 MED ORDER — METHYLPREDNISOLONE ACETATE 40 MG/ML INJ SUSP (RADIOLOG
80.0000 mg | Freq: Once | INTRAMUSCULAR | Status: AC
  Administered 2021-12-20: 80 mg via EPIDURAL

## 2021-12-20 NOTE — Discharge Instructions (Signed)

## 2021-12-23 ENCOUNTER — Other Ambulatory Visit: Payer: Self-pay | Admitting: Internal Medicine

## 2021-12-30 ENCOUNTER — Telehealth: Payer: Self-pay | Admitting: Internal Medicine

## 2021-12-30 NOTE — Telephone Encounter (Signed)
Patient called and said he had gotten his flu and covid shot at [facility] [address] [place], [place] He said it still hasn't been updated in his mychart so he called them and they told him that we would need to call them to get the info. Please advise

## 2021-12-31 NOTE — Telephone Encounter (Signed)
Checked [facility] place dates of flu and covid.Marland KitchenJohny Escobar

## 2022-01-06 ENCOUNTER — Other Ambulatory Visit: Payer: Self-pay

## 2022-01-16 DIAGNOSIS — M4316 Spondylolisthesis, lumbar region: Secondary | ICD-10-CM | POA: Diagnosis not present

## 2022-01-16 DIAGNOSIS — M5126 Other intervertebral disc displacement, lumbar region: Secondary | ICD-10-CM | POA: Diagnosis not present

## 2022-01-23 DIAGNOSIS — M5416 Radiculopathy, lumbar region: Secondary | ICD-10-CM | POA: Diagnosis not present

## 2022-02-03 ENCOUNTER — Ambulatory Visit (INDEPENDENT_AMBULATORY_CARE_PROVIDER_SITE_OTHER): Payer: Medicare Other | Admitting: Internal Medicine

## 2022-02-03 ENCOUNTER — Encounter: Payer: Self-pay | Admitting: Internal Medicine

## 2022-02-03 ENCOUNTER — Other Ambulatory Visit: Payer: Self-pay | Admitting: Internal Medicine

## 2022-02-03 VITALS — BP 180/60 | HR 69 | Temp 97.9°F | Ht 67.0 in | Wt 144.0 lb

## 2022-02-03 DIAGNOSIS — R300 Dysuria: Secondary | ICD-10-CM | POA: Diagnosis not present

## 2022-02-03 LAB — COMPREHENSIVE METABOLIC PANEL
ALT: 13 U/L (ref 0–53)
AST: 23 U/L (ref 0–37)
Albumin: 4.1 g/dL (ref 3.5–5.2)
Alkaline Phosphatase: 26 U/L — ABNORMAL LOW (ref 39–117)
BUN: 18 mg/dL (ref 6–23)
CO2: 29 mEq/L (ref 19–32)
Calcium: 9.5 mg/dL (ref 8.4–10.5)
Chloride: 99 mEq/L (ref 96–112)
Creatinine, Ser: 1.18 mg/dL (ref 0.40–1.50)
GFR: 58.27 mL/min — ABNORMAL LOW (ref >60.00)
Glucose, Bld: 103 mg/dL — ABNORMAL HIGH (ref 70–99)
Potassium: 4.5 mEq/L (ref 3.5–5.1)
Sodium: 136 mEq/L (ref 135–145)
Total Bilirubin: 0.4 mg/dL (ref 0.2–1.2)
Total Protein: 7 g/dL (ref 6.0–8.3)

## 2022-02-03 LAB — CBC
HCT: 33.7 % — ABNORMAL LOW (ref 39.0–52.0)
Hemoglobin: 11.4 g/dL — ABNORMAL LOW (ref 13.0–17.0)
MCHC: 33.7 g/dL (ref 30.0–36.0)
MCV: 85.6 fl (ref 78.0–100.0)
Platelets: 169 10*3/uL (ref 150.0–400.0)
RBC: 3.94 Mil/uL — ABNORMAL LOW (ref 4.22–5.81)
RDW: 14.7 % (ref 11.5–15.5)
WBC: 5.6 10*3/uL (ref 4.0–10.5)

## 2022-02-03 LAB — URINALYSIS, ROUTINE W REFLEX MICROSCOPIC
Bilirubin Urine: NEGATIVE
Hgb urine dipstick: NEGATIVE
Ketones, ur: NEGATIVE
Nitrite: NEGATIVE
Specific Gravity, Urine: 1.01 (ref 1.000–1.030)
Total Protein, Urine: NEGATIVE
Urine Glucose: NEGATIVE
Urobilinogen, UA: 0.2 (ref 0.0–1.0)
pH: 7.5 (ref 5.0–8.0)

## 2022-02-03 MED ORDER — CEPHALEXIN 500 MG PO CAPS: 500.0000 mg | ORAL_CAPSULE | Freq: Two times a day (BID) | ORAL | 0 refills | Status: AC

## 2022-02-03 NOTE — Progress Notes (Signed)
   Subjective:   Patient ID: Evan Escobar, male    DOB: Dec 31, 1940, 81 y.o.   MRN: [id number]  HPI The patient is an 81 YO man coming in for acute visit. Recent shot in back and is improving pain some.   Review of Systems  Constitutional:  Positive for activity change. Negative for appetite change, fatigue, fever and unexpected weight change.  Respiratory: Negative.    Cardiovascular: Negative.   Genitourinary:  Positive for dysuria and frequency.  Musculoskeletal:  Positive for back pain and myalgias. Negative for arthralgias.  Skin: Negative.   Neurological:  Negative for syncope, weakness and numbness.    Objective:  Physical Exam Constitutional:      Appearance: He is well-developed.  HENT:     Head: Normocephalic and atraumatic.  Cardiovascular:     Rate and Rhythm: Normal rate and regular rhythm.  Pulmonary:     Effort: Pulmonary effort is normal. No respiratory distress.     Breath sounds: Normal breath sounds. No wheezing or rales.  Abdominal:     General: Bowel sounds are normal. There is no distension.     Palpations: Abdomen is soft.     Tenderness: There is no abdominal tenderness. There is no rebound.  Musculoskeletal:        General: Tenderness present.     Cervical back: Normal range of motion.  Skin:    General: Skin is warm and dry.  Neurological:     Mental Status: He is alert and oriented to person, place, and time.     Coordination: Coordination abnormal.     Comments: cane     Vitals:   02/03/22 0807 02/03/22 0814  BP: (!) 180/60 (!) 180/60  Pulse: 69   Temp: 97.9 F (36.6 C)   TempSrc: Oral   SpO2: 99%   Weight: 144 lb (65.3 kg)   Height: '5\' 7"'$  (1.702 m)     Assessment & Plan:

## 2022-02-03 NOTE — Patient Instructions (Signed)
We will check the urine and blood today.

## 2022-02-03 NOTE — Assessment & Plan Note (Signed)
Checking U/A, CBC, CMP and treat as appropriate. Could be UTI as he has had this before and may be caused by low back radiculopathy.

## 2022-02-05 ENCOUNTER — Encounter (INDEPENDENT_AMBULATORY_CARE_PROVIDER_SITE_OTHER): Payer: Medicare Other | Admitting: Ophthalmology

## 2022-02-05 DIAGNOSIS — H34831 Tributary (branch) retinal vein occlusion, right eye, with macular edema: Secondary | ICD-10-CM

## 2022-02-05 DIAGNOSIS — H35033 Hypertensive retinopathy, bilateral: Secondary | ICD-10-CM | POA: Diagnosis not present

## 2022-02-05 DIAGNOSIS — H43813 Vitreous degeneration, bilateral: Secondary | ICD-10-CM

## 2022-02-05 DIAGNOSIS — H348122 Central retinal vein occlusion, left eye, stable: Secondary | ICD-10-CM | POA: Diagnosis not present

## 2022-02-05 DIAGNOSIS — I10 Essential (primary) hypertension: Secondary | ICD-10-CM

## 2022-02-20 DIAGNOSIS — H16223 Keratoconjunctivitis sicca, not specified as Sjogren's, bilateral: Secondary | ICD-10-CM | POA: Diagnosis not present

## 2022-02-20 DIAGNOSIS — H348121 Central retinal vein occlusion, left eye, with retinal neovascularization: Secondary | ICD-10-CM | POA: Diagnosis not present

## 2022-02-20 DIAGNOSIS — H1131 Conjunctival hemorrhage, right eye: Secondary | ICD-10-CM | POA: Diagnosis not present

## 2022-02-20 DIAGNOSIS — H401133 Primary open-angle glaucoma, bilateral, severe stage: Secondary | ICD-10-CM | POA: Diagnosis not present

## 2022-02-20 DIAGNOSIS — H3562 Retinal hemorrhage, left eye: Secondary | ICD-10-CM | POA: Diagnosis not present

## 2022-02-20 DIAGNOSIS — H348312 Tributary (branch) retinal vein occlusion, right eye, stable: Secondary | ICD-10-CM | POA: Diagnosis not present

## 2022-03-29 ENCOUNTER — Other Ambulatory Visit: Payer: Self-pay | Admitting: Internal Medicine

## 2022-03-29 ENCOUNTER — Other Ambulatory Visit: Payer: Self-pay | Admitting: Emergency Medicine

## 2022-03-29 DIAGNOSIS — I10 Essential (primary) hypertension: Secondary | ICD-10-CM

## 2022-03-29 NOTE — Telephone Encounter (Signed)
Please send to PCP, Dr. Sharlet Salina for refills.

## 2022-04-01 DIAGNOSIS — H348312 Tributary (branch) retinal vein occlusion, right eye, stable: Secondary | ICD-10-CM | POA: Diagnosis not present

## 2022-04-01 DIAGNOSIS — H348122 Central retinal vein occlusion, left eye, stable: Secondary | ICD-10-CM | POA: Diagnosis not present

## 2022-04-04 ENCOUNTER — Ambulatory Visit: Payer: Medicare Other | Admitting: Internal Medicine

## 2022-04-07 ENCOUNTER — Encounter: Payer: Self-pay | Admitting: Internal Medicine

## 2022-04-07 ENCOUNTER — Other Ambulatory Visit: Payer: Self-pay | Admitting: Internal Medicine

## 2022-04-07 ENCOUNTER — Ambulatory Visit (INDEPENDENT_AMBULATORY_CARE_PROVIDER_SITE_OTHER): Payer: Medicare Other | Admitting: Internal Medicine

## 2022-04-07 VITALS — BP 140/70 | HR 60 | Temp 97.8°F | Ht 67.0 in | Wt 148.0 lb

## 2022-04-07 DIAGNOSIS — R300 Dysuria: Secondary | ICD-10-CM | POA: Diagnosis not present

## 2022-04-07 DIAGNOSIS — I10 Essential (primary) hypertension: Secondary | ICD-10-CM

## 2022-04-07 DIAGNOSIS — M5416 Radiculopathy, lumbar region: Secondary | ICD-10-CM | POA: Diagnosis not present

## 2022-04-07 DIAGNOSIS — K5904 Chronic idiopathic constipation: Secondary | ICD-10-CM

## 2022-04-07 LAB — CBC
HCT: 35.7 % — ABNORMAL LOW (ref 39.0–52.0)
Hemoglobin: 11.8 g/dL — ABNORMAL LOW (ref 13.0–17.0)
MCHC: 33.2 g/dL (ref 30.0–36.0)
MCV: 86.2 fl (ref 78.0–100.0)
Platelets: 160 10*3/uL (ref 150.0–400.0)
RBC: 4.14 Mil/uL — ABNORMAL LOW (ref 4.22–5.81)
RDW: 14.2 % (ref 11.5–15.5)
WBC: 4.7 10*3/uL (ref 4.0–10.5)

## 2022-04-07 LAB — URINALYSIS, ROUTINE W REFLEX MICROSCOPIC
Bilirubin Urine: NEGATIVE
Ketones, ur: NEGATIVE
Nitrite: NEGATIVE
RBC / HPF: NONE SEEN (ref 0–?)
Specific Gravity, Urine: <=1.005 — AB (ref 1.000–1.030)
Total Protein, Urine: NEGATIVE
Urine Glucose: NEGATIVE
Urobilinogen, UA: 0.2 (ref 0.0–1.0)
pH: 6.5 (ref 5.0–8.0)

## 2022-04-07 LAB — COMPREHENSIVE METABOLIC PANEL
ALT: 9 U/L (ref 0–53)
AST: 16 U/L (ref 0–37)
Albumin: 4.2 g/dL (ref 3.5–5.2)
Alkaline Phosphatase: 28 U/L — ABNORMAL LOW (ref 39–117)
BUN: 19 mg/dL (ref 6–23)
CO2: 30 mEq/L (ref 19–32)
Calcium: 9.8 mg/dL (ref 8.4–10.5)
Chloride: 100 mEq/L (ref 96–112)
Creatinine, Ser: 1.2 mg/dL (ref 0.40–1.50)
GFR: 57.04 mL/min — ABNORMAL LOW (ref >60.00)
Glucose, Bld: 79 mg/dL (ref 70–99)
Potassium: 4.2 mEq/L (ref 3.5–5.1)
Sodium: 136 mEq/L (ref 135–145)
Total Bilirubin: 0.4 mg/dL (ref 0.2–1.2)
Total Protein: 7.4 g/dL (ref 6.0–8.3)

## 2022-04-07 LAB — LIPID PANEL
Cholesterol: 188 mg/dL (ref 0–200)
HDL: 77.4 mg/dL (ref >39.00)
LDL Cholesterol: 101 mg/dL — ABNORMAL HIGH (ref 0–99)
NonHDL: 110.68
Total CHOL/HDL Ratio: 2
Triglycerides: 48 mg/dL (ref 0.0–149.0)
VLDL: 9.6 mg/dL (ref 0.0–40.0)

## 2022-04-07 MED ORDER — CEPHALEXIN 500 MG PO CAPS: 500.0000 mg | ORAL_CAPSULE | Freq: Two times a day (BID) | ORAL | 0 refills | Status: AC

## 2022-04-07 NOTE — Assessment & Plan Note (Signed)
BP at goal today on lisinopril 20 mg daily. Checking CBC and CMP today and lipid panel. Adjust as needed.

## 2022-04-07 NOTE — Assessment & Plan Note (Signed)
Doing well currently and drinking water and using milk of magnesia.

## 2022-04-07 NOTE — Assessment & Plan Note (Signed)
Using cane for stability and is having some pain. Recent injection and likely due for repeat if needed. Advised to call neurosurgeon to get this done.

## 2022-04-07 NOTE — Assessment & Plan Note (Signed)
Residual since last visit. Checking U/A and treat as needed.

## 2022-04-07 NOTE — Patient Instructions (Addendum)
We will do the labs today.

## 2022-04-07 NOTE — Progress Notes (Signed)
   Subjective:   Patient ID: Evan Escobar, male    DOB: 1940-05-03, 82 y.o.   MRN: [id number]  HPI The patient is an 82 YO man coming in for follow up and some ongoing dysuria. Did antibiotics recently and better but not gone.  Review of Systems  Constitutional: Negative.   HENT: Negative.    Eyes: Negative.   Respiratory:  Negative for cough, chest tightness and shortness of breath.   Cardiovascular:  Negative for chest pain, palpitations and leg swelling.  Gastrointestinal:  Negative for abdominal distention, abdominal pain, constipation, diarrhea, nausea and vomiting.  Genitourinary:  Positive for dysuria.  Musculoskeletal:  Positive for back pain.  Skin: Negative.   Neurological: Negative.   Psychiatric/Behavioral: Negative.      Objective:  Physical Exam Constitutional:      Appearance: He is well-developed.  HENT:     Head: Normocephalic and atraumatic.  Cardiovascular:     Rate and Rhythm: Normal rate and regular rhythm.  Pulmonary:     Effort: Pulmonary effort is normal. No respiratory distress.     Breath sounds: Normal breath sounds. No wheezing or rales.  Abdominal:     General: Bowel sounds are normal. There is no distension.     Palpations: Abdomen is soft.     Tenderness: There is no abdominal tenderness. There is no rebound.  Musculoskeletal:     Cervical back: Normal range of motion.  Skin:    General: Skin is warm and dry.  Neurological:     Mental Status: He is alert and oriented to person, place, and time.     Coordination: Coordination abnormal.     Comments: Cane     Vitals:   04/07/22 1104 04/07/22 1106  BP: (!) 140/70 (!) 140/70  Pulse: 60   Temp: 97.8 F (36.6 C)   TempSrc: Oral   SpO2: 99%   Weight: 148 lb (67.1 kg)   Height: 5' 7"$  (1.702 m)     Assessment & Plan:

## 2022-04-16 ENCOUNTER — Ambulatory Visit: Payer: Medicare Other | Admitting: Podiatry

## 2022-04-16 ENCOUNTER — Encounter: Payer: Self-pay | Admitting: Podiatry

## 2022-04-16 VITALS — BP 147/75

## 2022-04-16 DIAGNOSIS — M79674 Pain in right toe(s): Secondary | ICD-10-CM | POA: Diagnosis not present

## 2022-04-16 DIAGNOSIS — M79675 Pain in left toe(s): Secondary | ICD-10-CM | POA: Diagnosis not present

## 2022-04-16 DIAGNOSIS — B351 Tinea unguium: Secondary | ICD-10-CM | POA: Diagnosis not present

## 2022-04-16 NOTE — Patient Instructions (Signed)
Apply Aquaphor Lotion for dry skin to both feet once daily. Do not apply between toes.

## 2022-04-16 NOTE — Progress Notes (Signed)
  Subjective:  Patient ID: Evan Escobar, male    DOB: Jun 27, 1940,  MRN: [id number]  Gram Caple presents to clinic today for painful elongated mycotic toenails 1-5 bilaterally which are tender when wearing enclosed shoe gear. Pain is relieved with periodic professional debridement.  Chief Complaint  Patient presents with   Nail Problem    [facility] PCP-[facility] PCP [facility]-"Couple weeks ago"   New problem(s): None.   PCP is Myrlene Broker, MD.  No Known Allergies  Review of Systems: Negative except as noted in the HPI.  Objective: No changes noted in today's physical examination. Vitals:   04/16/22 1050  BP: (!) 147/75   Evan Escobar is a pleasant 82 y.o. male WD, WN in NAD. AAO x 3. Vascular CFT immediate b/l LE. Palpable DP/PT pulses b/l LE. Digital hair sparse b/l. Skin temperature gradient WNL b/l. No pain with calf compression b/l. No edema noted b/l. No cyanosis or clubbing noted b/l LE.  Neurologic Normal speech. Oriented to person, place, and time. Protective sensation intact 5/5 intact bilaterally with 10g monofilament b/l. Vibratory sensation intact b/l.  Dermatologic Pedal integument with normal turgor, texture and tone b/l LE. No open wounds b/l. No interdigital macerations b/l. Toenails 1-5 b/l elongated, thickened, discolored with subungual debris. +Tenderness with dorsal palpation of nailplates. No hyperkeratotic or porokeratotic lesions present.  Orthopedic: Normal muscle strength 5/5 to all lower extremity muscle groups bilaterally. HAV with bunion deformity noted b/l LE. Hammertoe deformity noted 2-5 b/l.Marland Kitchen No pain, crepitus or joint limitation noted with ROM b/l LE.  Patient ambulates independently without assistive aids.   Radiographs: None  Assessment/Plan: 1. Pain due to onychomycosis of toenails of both feet     -Patient was evaluated and treated. All patient's and/or POA's questions/concerns answered on today's visit. -Patient to continue soft, supportive  shoe gear daily. -Toenails 1-5 b/l were debrided in length and girth with sterile nail nippers and dremel without iatrogenic bleeding.  -For dry skin, patient advised to apply AquaPhor Lotion to feet once daily avoiding application between toes. -Patient/POA to call should there be question/concern in the interim.   Return in about 3 months (around 07/15/2022).  Freddie Breech, DPM

## 2022-04-28 DIAGNOSIS — M5416 Radiculopathy, lumbar region: Secondary | ICD-10-CM | POA: Diagnosis not present

## 2022-05-12 ENCOUNTER — Ambulatory Visit (INDEPENDENT_AMBULATORY_CARE_PROVIDER_SITE_OTHER): Payer: Medicare Other | Admitting: Emergency Medicine

## 2022-05-12 ENCOUNTER — Encounter: Payer: Self-pay | Admitting: Emergency Medicine

## 2022-05-12 VITALS — BP 160/82 | HR 67 | Temp 98.0°F | Ht 67.0 in | Wt 145.4 lb

## 2022-05-12 DIAGNOSIS — N390 Urinary tract infection, site not specified: Secondary | ICD-10-CM | POA: Diagnosis not present

## 2022-05-12 DIAGNOSIS — Z87440 Personal history of urinary (tract) infections: Secondary | ICD-10-CM | POA: Insufficient documentation

## 2022-05-12 DIAGNOSIS — R300 Dysuria: Secondary | ICD-10-CM | POA: Diagnosis not present

## 2022-05-12 DIAGNOSIS — N138 Other obstructive and reflux uropathy: Secondary | ICD-10-CM | POA: Diagnosis not present

## 2022-05-12 DIAGNOSIS — N401 Enlarged prostate with lower urinary tract symptoms: Secondary | ICD-10-CM | POA: Diagnosis not present

## 2022-05-12 LAB — POCT URINALYSIS DIPSTICK
Bilirubin, UA: NEGATIVE
Blood, UA: NEGATIVE
Glucose, UA: NEGATIVE
Ketones, UA: NEGATIVE
Nitrite, UA: NEGATIVE
Protein, UA: NEGATIVE
Spec Grav, UA: 1.01 (ref 1.010–1.025)
Urobilinogen, UA: 0.2 E.U./dL
pH, UA: 6 (ref 5.0–8.0)

## 2022-05-12 MED ORDER — CIPROFLOXACIN HCL 500 MG PO TABS: 500.0000 mg | ORAL_TABLET | Freq: Two times a day (BID) | ORAL | 0 refills | Status: AC

## 2022-05-12 NOTE — Progress Notes (Signed)
Evan Escobar 82 y.o.   Chief Complaint  Patient presents with   Constipation    Patient was constipated and took milk mag, and now have diarrhea  Urinary frequency, burning sensation , dysuria, urinary retention     HISTORY OF PRESENT ILLNESS: Acute problem visit today, patient of Dr. Pricilla Holm This is a 82 y.o. male with history of BPH and lower urinary tract symptoms, on Flomax 0.4 daily, with history of recurrent UTIs, complaining of burning on urination for the last couple days.  Denies fever or chills.  Able to eat and drink.  Denies nausea or vomiting.  Denies abdominal or flank pain. Had episode of constipation and took milk of magnesia.  Developed diarrhea secondary to that. No other comp pains or medical concerns today.  Constipation Pertinent negatives include no abdominal pain, fever, nausea or vomiting.     Prior to Admission medications   Medication Sig Start Date End Date Taking? Authorizing Provider  acetaminophen (TYLENOL) 500 MG tablet Take 500 mg by mouth every 6 (six) hours as needed for moderate pain or headache.    Yes [provider]  aspirin 81 MG tablet Take 81 mg by mouth daily.   Yes [provider]  Aspirin-Calcium Carbonate 81-777 MG TABS Take by mouth.   Yes [provider]  benzonatate (TESSALON) 100 MG capsule Take 1 capsule (100 mg total) by mouth every 8 (eight) hours. 07/13/21  Yes White, Adrienne R, NP  brimonidine (ALPHAGAN P) 0.1 % SOLN Place 1 drop 3 (three) times daily into both eyes.   Yes [provider]  cetirizine (ZYRTEC) 5 MG tablet Take 1 tablet (5 mg total) by mouth daily. 09/03/20  Yes Jaynee Eagles, PA-C  cycloSPORINE, PF, (CEQUA) 0.09 % SOLN Apply 1 drop to eye 2 (two) times daily.   Yes [provider]  diclofenac Sodium (VOLTAREN) 1 % GEL Apply 2 g topically 4 (four) times daily. 07/13/21  Yes White, Adrienne R, NP  fluticasone (FLONASE) 50 MCG/ACT nasal spray Place 2 sprays into  both nostrils daily. 09/03/20  Yes Jaynee Eagles, PA-C  gabapentin (NEURONTIN) 300 MG capsule take 1 capsule by oral route at bedtime 12/03/21  Yes [provider]  latanoprost (XALATAN) 0.005 % ophthalmic solution Place 1 drop into both eyes at bedtime. 09/20/12  Yes [provider]  levETIRAcetam (KEPPRA) 500 MG tablet TAKE 1 TABLET(500 MG) BY MOUTH TWICE DAILY 12/09/21  Yes Marcial Pacas, MD  lisinopril (ZESTRIL) 20 MG tablet TAKE 1 TABLET(20 MG) BY MOUTH DAILY 03/31/22  Yes [name], MD  magnesium hydroxide (MILK OF MAGNESIA) 400 MG/5ML suspension Take 30 mLs by mouth as needed for mild constipation.   Yes [provider]  Multiple Vitamin (MULTIVITAMIN) tablet Take 1 tablet by mouth daily.   Yes [provider]  polyethylene glycol powder (GLYCOLAX/MIRALAX) 17 GM/SCOOP powder Take 17 g by mouth 2 (two) times daily as needed. 07/30/21  Yes [name], MD  RESTASIS 0.05 % ophthalmic emulsion 1 drop 2 (two) times daily. 12/03/20  Yes [provider]  tamsulosin (FLOMAX) 0.4 MG CAPS capsule TAKE 1 CAPSULE(0.4 MG) BY MOUTH DAILY 03/31/22  Yes [name], MD  Tetrahydroz-Dextran-PEG-Povid 0.05-0.1-1-1 % SOLN    Yes [provider]  traMADol (ULTRAM) 50 MG tablet Take 50 mg by mouth every 6 (six) hours as needed.   Yes [provider]  ciprofloxacin (CIPRO) 500 MG tablet Take 1 tablet (500 mg total) by mouth 2 (two) times  daily for 7 days. 05/12/22 05/19/22 Yes Horald Pollen, MD    No Known Allergies  Patient Active Problem List   Diagnosis Date Noted   Dysuria 02/03/2022   Toenail fungus 07/30/2021   Left foot pain 04/16/2021   Deficiency anemia 10/13/2019   Cough 10/13/2019   Chronic idiopathic constipation 10/13/2019   Cervical radiculopathy 01/10/2019   Left lumbar radiculopathy 12/09/2017   Right hand pain 10/06/2017   Hearing loss 09/09/2016   Benign prostatic hyperplasia 08/28/2015    Encounter for general adult medical examination with abnormal findings 08/28/2015   Allergic rhinitis 02/26/2015   Glaucoma 05/07/2013   Essential hypertension, benign 05/07/2013   Seizures ([place]) 03/17/2013    Past Medical History:  Diagnosis Date   Allergy    Arthritis    Cataract    Glaucoma    Hypertension    Normocytic anemia 09/25/2012   Polyp, sigmoid colon 11/05/2011   Seizure ([place])    Seizures ([place]) 03/17/2013    Past Surgical History:  Procedure Laterality Date   ANTERIOR CERVICAL DECOMP/DISCECTOMY FUSION N/A 05/22/2017   Procedure: Anterior Cervical Discectomy Fusion - Cervical Three-Cervical Four - Cervical Four-Cervical Five;  Surgeon: Kary Kos, MD;  Location: [facility];  Service: Neurosurgery;  Laterality: N/A;   CATARACT EXTRACTION, BILATERAL     EYE SURGERY     GLAUCOMA REPAIR  10 yrs ago    Social History   Socioeconomic History   Marital status: Married    Spouse name: Terrence Dupont   Number of children: 1   Years of education: 11   Highest education level: Not on file  Occupational History   Occupation: retired  Tobacco Use   Smoking status: Former    Packs/day: 1    Types: Cigarettes    Quit date: 02/18/2007    Years since quitting: 15.2   Smokeless tobacco: Never   Tobacco comments:    not sure but a while ago; quit smoking and ETOH  Vaping Use   Vaping Use: Never used  Substance and Sexual Activity   Alcohol use: No    Alcohol/week: 0.0 standard drinks of alcohol    Comment: quit in 2009   Drug use: No   Sexual activity: Not Currently  Other Topics Concern   Not on file  Social History Narrative   Patient lives at home with his wife Terrence Dupont). Patient is retired. Patient has 11 th grade education.    Caffeine- one cup of coffee daily and one soda.   Right handed.   Social Determinants of Health   Financial Resource Strain: Low Risk  (05/30/2020)   Overall Financial Resource Strain (CARDIA)    Difficulty of Paying Living Expenses: Not hard at all   Food Insecurity: Not on file  Transportation Needs: Not on file  Physical Activity: Not on file  Stress: Not on file  Social Connections: Not on file  Intimate Partner Violence: Not on file    Family History  Problem Relation Age of Onset   Stroke Mother    High blood pressure Mother    Stroke Father    High blood pressure Father    Colon cancer Brother    Esophageal cancer Neg Hx    Pancreatic cancer Neg Hx    Rectal cancer Neg Hx    Stomach cancer Neg Hx      Review of Systems  Constitutional:  Negative for chills and fever.  HENT: Negative.  Negative for congestion and sore throat.   Respiratory: Negative.  Negative for cough and shortness of breath.   Cardiovascular: Negative.  Negative for chest pain and palpitations.  Gastrointestinal:  Positive for constipation. Negative for abdominal pain, nausea and vomiting.  Genitourinary:  Positive for dysuria and frequency. Negative for flank pain and hematuria.  Neurological: Negative.  Negative for dizziness and headaches.  All other systems reviewed and are negative.   Vitals:   05/12/22 0943  BP: (!) 160/82  Pulse: 67  Temp: 98 F (36.7 C)  SpO2: 100%    Physical Exam Vitals reviewed.  Constitutional:      Appearance: Normal appearance.  HENT:     Head: Normocephalic.  Eyes:     Extraocular Movements: Extraocular movements intact.  Cardiovascular:     Rate and Rhythm: Normal rate.  Pulmonary:     Effort: Pulmonary effort is normal.  Abdominal:     Palpations: Abdomen is soft.     Tenderness: There is no abdominal tenderness. There is no right CVA tenderness or left CVA tenderness.  Skin:    General: Skin is warm and dry.  Neurological:     Mental Status: He is alert and oriented to person, place, and time.  Psychiatric:        Mood and Affect: Mood normal.        Behavior: Behavior normal.    Results for orders placed or performed in visit on 05/12/22 (from the past 24 hour(s))  POCT Urinalysis  Dipstick     Status: Abnormal   Collection Time: 05/12/22 10:18 AM  Result Value Ref Range   Color, UA yellow    Clarity, UA clear    Glucose, UA Negative Negative   Bilirubin, UA negative    Ketones, UA negative    Spec Grav, UA 1.010 1.010 - 1.025   Blood, UA negative    pH, UA 6.0 5.0 - 8.0   Protein, UA Negative Negative   Urobilinogen, UA 0.2 0.2 or 1.0 E.U./dL   Nitrite, UA negative    Leukocytes, UA Large (3+) (A) Negative   Appearance     Odor       ASSESSMENT & PLAN: A total of 42 minutes was spent with the patient and counseling/coordination of care regarding preparing for this visit, review of most recent office visit notes, review of chronic medical conditions under management, review of all medications, diagnosis of urinary tract infection and need for antibiotics, prognosis, documentation, and need for follow-up.  Problem List Items Addressed This Visit       Genitourinary   BPH with obstruction/lower urinary tract symptoms    Made worse by infection Continues Flomax 0.4 mg daily Will treat infection with antibiotics      Acute UTI - Primary    Positive urinalysis Urine sent for culture No recent culture available for review Recommend to start Cipro 500 mg twice a day for 7 days        Other   Dysuria    Secondary to infection May take over-the-counter Azo as needed for symptom control Advised to stay well-hydrated and take antibiotics as prescribed      Relevant Medications   ciprofloxacin (CIPRO) 500 MG tablet   Other Relevant Orders   POCT Urinalysis Dipstick (Completed)   Urine Culture   History of recurrent UTIs    No recent urine culture for review of organism and review of resistance and sensitivity to different antibiotics      Patient Instructions  Urinary Tract Infection, Adult A urinary tract infection (UTI)  is an infection of any part of the urinary tract. The urinary tract includes: The kidneys. The ureters. The bladder. The  urethra. These organs make, store, and get rid of pee (urine) in the body. What are the causes? This infection is caused by germs (bacteria) in your genital area. These germs grow and cause swelling (inflammation) of your urinary tract. What increases the risk? The following factors may make you more likely to develop this condition: Using a small, thin tube (catheter) to drain pee. Not being able to control when you pee or poop (incontinence). Being male. If you are male, these things can increase the risk: Using these methods to prevent pregnancy: A medicine that kills sperm (spermicide). A device that blocks sperm (diaphragm). Having low levels of a male hormone (estrogen). Being pregnant. You are more likely to develop this condition if: You have genes that add to your risk. You are sexually active. You take antibiotic medicines. You have trouble peeing because of: A prostate that is bigger than normal, if you are male. A blockage in the part of your body that drains pee from the bladder. A kidney stone. A nerve condition that affects your bladder. Not getting enough to drink. Not peeing often enough. You have other conditions, such as: Diabetes. A weak disease-fighting system (immune system). Sickle cell disease. Gout. Injury of the spine. What are the signs or symptoms? Symptoms of this condition include: Needing to pee right away. Peeing small amounts often. Pain or burning when peeing. Blood in the pee. Pee that smells bad or not like normal. Trouble peeing. Pee that is cloudy. Fluid coming from the vagina, if you are male. Pain in the belly or lower back. Other symptoms include: Vomiting. Not feeling hungry. Feeling mixed up (confused). This may be the first symptom in older adults. Being tired and grouchy (irritable). A fever. Watery poop (diarrhea). How is this treated? Taking antibiotic medicine. Taking other medicines. Drinking enough  water. In some cases, you may need to see a specialist. Follow these instructions at home:  Medicines Take over-the-counter and prescription medicines only as told by your doctor. If you were prescribed an antibiotic medicine, take it as told by your doctor. Do not stop taking it even if you start to feel better. General instructions Make sure you: Pee until your bladder is empty. Do not hold pee for a long time. Empty your bladder after sex. Wipe from front to back after peeing or pooping if you are a male. Use each tissue one time when you wipe. Drink enough fluid to keep your pee pale yellow. Keep all follow-up visits. Contact a doctor if: You do not get better after 1-2 days. Your symptoms go away and then come back. Get help right away if: You have very bad back pain. You have very bad pain in your lower belly. You have a fever. You have chills. You feeling like you will vomit or you vomit. Summary A urinary tract infection (UTI) is an infection of any part of the urinary tract. This condition is caused by germs in your genital area. There are many risk factors for a UTI. Treatment includes antibiotic medicines. Drink enough fluid to keep your pee pale yellow. This information is not intended to replace advice given to you by your health care provider. Make sure you discuss any questions you have with your health care provider. Document Revised: 09/16/2019 Document Reviewed: 09/16/2019 Elsevier Patient Education  [place]  Mitchel Honour, MD [facility]

## 2022-05-12 NOTE — Assessment & Plan Note (Signed)
Secondary to infection May take over-the-counter Azo as needed for symptom control Advised to stay well-hydrated and take antibiotics as prescribed

## 2022-05-12 NOTE — Assessment & Plan Note (Signed)
No recent urine culture for review of organism and review of resistance and sensitivity to different antibiotics

## 2022-05-12 NOTE — Patient Instructions (Signed)
Urinary Tract Infection, Adult A urinary tract infection (UTI) is an infection of any part of the urinary tract. The urinary tract includes: The kidneys. The ureters. The bladder. The urethra. These organs make, store, and get rid of pee (urine) in the body. What are the causes? This infection is caused by germs (bacteria) in your genital area. These germs grow and cause swelling (inflammation) of your urinary tract. What increases the risk? The following factors may make you more likely to develop this condition: Using a small, thin tube (catheter) to drain pee. Not being able to control when you pee or poop (incontinence). Being male. If you are male, these things can increase the risk: Using these methods to prevent pregnancy: A medicine that kills sperm (spermicide). A device that blocks sperm (diaphragm). Having low levels of a male hormone (estrogen). Being pregnant. You are more likely to develop this condition if: You have genes that add to your risk. You are sexually active. You take antibiotic medicines. You have trouble peeing because of: A prostate that is bigger than normal, if you are male. A blockage in the part of your body that drains pee from the bladder. A kidney stone. A nerve condition that affects your bladder. Not getting enough to drink. Not peeing often enough. You have other conditions, such as: Diabetes. A weak disease-fighting system (immune system). Sickle cell disease. Gout. Injury of the spine. What are the signs or symptoms? Symptoms of this condition include: Needing to pee right away. Peeing small amounts often. Pain or burning when peeing. Blood in the pee. Pee that smells bad or not like normal. Trouble peeing. Pee that is cloudy. Fluid coming from the vagina, if you are male. Pain in the belly or lower back. Other symptoms include: Vomiting. Not feeling hungry. Feeling mixed up (confused). This may be the first symptom in  older adults. Being tired and grouchy (irritable). A fever. Watery poop (diarrhea). How is this treated? Taking antibiotic medicine. Taking other medicines. Drinking enough water. In some cases, you may need to see a specialist. Follow these instructions at home:  Medicines Take over-the-counter and prescription medicines only as told by your doctor. If you were prescribed an antibiotic medicine, take it as told by your doctor. Do not stop taking it even if you start to feel better. General instructions Make sure you: Pee until your bladder is empty. Do not hold pee for a long time. Empty your bladder after sex. Wipe from front to back after peeing or pooping if you are a male. Use each tissue one time when you wipe. Drink enough fluid to keep your pee pale yellow. Keep all follow-up visits. Contact a doctor if: You do not get better after 1-2 days. Your symptoms go away and then come back. Get help right away if: You have very bad back pain. You have very bad pain in your lower belly. You have a fever. You have chills. You feeling like you will vomit or you vomit. Summary A urinary tract infection (UTI) is an infection of any part of the urinary tract. This condition is caused by germs in your genital area. There are many risk factors for a UTI. Treatment includes antibiotic medicines. Drink enough fluid to keep your pee pale yellow. This information is not intended to replace advice given to you by your health care provider. Make sure you discuss any questions you have with your health care provider. Document Revised: 09/16/2019 Document Reviewed: 09/16/2019 Elsevier Patient Education    2023 Elsevier Inc.  

## 2022-05-12 NOTE — Assessment & Plan Note (Signed)
Made worse by infection Continues Flomax 0.4 mg daily Will treat infection with antibiotics

## 2022-05-12 NOTE — Assessment & Plan Note (Signed)
Positive urinalysis Urine sent for culture No recent culture available for review Recommend to start Cipro 500 mg twice a day for 7 days

## 2022-05-13 ENCOUNTER — Telehealth: Payer: Self-pay | Admitting: Internal Medicine

## 2022-05-13 NOTE — Telephone Encounter (Signed)
Pt wants a call back from nurse to go over his lab results with him.   Best call back # [phone]

## 2022-05-13 NOTE — Telephone Encounter (Signed)
Thank you :)

## 2022-05-13 NOTE — Telephone Encounter (Signed)
Called pt he actually saw Dr. Mitchel Honour yesterday wanted to know results of urine culture. Inform pt culture has not came in yet, but will give him a call once MD receive.Marland KitchenJohny Chess

## 2022-05-15 LAB — URINE CULTURE

## 2022-05-15 NOTE — Telephone Encounter (Signed)
Please call the patient with the urinalysis results - patient's number:  [phone]

## 2022-05-20 DIAGNOSIS — M5126 Other intervertebral disc displacement, lumbar region: Secondary | ICD-10-CM | POA: Diagnosis not present

## 2022-05-20 DIAGNOSIS — M5416 Radiculopathy, lumbar region: Secondary | ICD-10-CM | POA: Diagnosis not present

## 2022-05-20 DIAGNOSIS — M4316 Spondylolisthesis, lumbar region: Secondary | ICD-10-CM | POA: Diagnosis not present

## 2022-05-21 NOTE — Telephone Encounter (Signed)
Patient was able to access results through my chart an there were no questions or concerns

## 2022-06-17 DIAGNOSIS — H348121 Central retinal vein occlusion, left eye, with retinal neovascularization: Secondary | ICD-10-CM | POA: Diagnosis not present

## 2022-06-17 DIAGNOSIS — H401133 Primary open-angle glaucoma, bilateral, severe stage: Secondary | ICD-10-CM | POA: Diagnosis not present

## 2022-06-17 DIAGNOSIS — H3562 Retinal hemorrhage, left eye: Secondary | ICD-10-CM | POA: Diagnosis not present

## 2022-06-17 DIAGNOSIS — H16223 Keratoconjunctivitis sicca, not specified as Sjogren's, bilateral: Secondary | ICD-10-CM | POA: Diagnosis not present

## 2022-06-17 DIAGNOSIS — H1131 Conjunctival hemorrhage, right eye: Secondary | ICD-10-CM | POA: Diagnosis not present

## 2022-06-17 DIAGNOSIS — H348312 Tributary (branch) retinal vein occlusion, right eye, stable: Secondary | ICD-10-CM | POA: Diagnosis not present

## 2022-07-31 ENCOUNTER — Other Ambulatory Visit: Payer: Self-pay

## 2022-07-31 ENCOUNTER — Encounter (HOSPITAL_COMMUNITY): Payer: Self-pay | Admitting: Emergency Medicine

## 2022-07-31 ENCOUNTER — Ambulatory Visit (HOSPITAL_COMMUNITY)
Admission: EM | Admit: 2022-07-31 | Discharge: 2022-07-31 | Disposition: A | Discharge status: Home / Self Care | Payer: Medicare Other | Attending: Internal Medicine | Admitting: Internal Medicine

## 2022-07-31 DIAGNOSIS — Z87438 Personal history of other diseases of male genital organs: Secondary | ICD-10-CM | POA: Insufficient documentation

## 2022-07-31 DIAGNOSIS — R3915 Urgency of urination: Secondary | ICD-10-CM | POA: Diagnosis not present

## 2022-07-31 DIAGNOSIS — Z87440 Personal history of urinary (tract) infections: Secondary | ICD-10-CM | POA: Diagnosis not present

## 2022-07-31 DIAGNOSIS — R300 Dysuria: Secondary | ICD-10-CM

## 2022-07-31 LAB — POCT URINALYSIS DIP (MANUAL ENTRY)
Bilirubin, UA: NEGATIVE
Blood, UA: NEGATIVE
Glucose, UA: NEGATIVE mg/dL
Ketones, POC UA: NEGATIVE mg/dL
Nitrite, UA: POSITIVE — AB
Protein Ur, POC: NEGATIVE mg/dL
Spec Grav, UA: 1.01 (ref 1.010–1.025)
Urobilinogen, UA: 0.2 E.U./dL
pH, UA: 6 (ref 5.0–8.0)

## 2022-07-31 MED ORDER — CEPHALEXIN 500 MG PO CAPS: 500.0000 mg | ORAL_CAPSULE | Freq: Two times a day (BID) | ORAL | 0 refills | Status: DC

## 2022-07-31 NOTE — Discharge Instructions (Signed)
Recommend start antibiotic Keflex 500mg  twice a day for 7 days. Continue to drink water and fluids to help with symptoms. Continue Flomax (Tamsulosin) once daily as prescribed. You may need to see a Urologist due to history of recurrent urinary tract infections and history of BPH- please call them in the next few days to schedule an appointment. If pain and symptoms get worse and you are not able to urinate at all, go to the ER ASAP. Otherwise, follow-up pending urine culture results.

## 2022-07-31 NOTE — ED Provider Notes (Signed)
[facility]    CSN: [id number] Arrival date & time: 07/31/22  1303      History   Chief Complaint Chief Complaint  Patient presents with   Penis Pain    HPI Evan Escobar is a 82 y.o. male.   82 year old male presents with pain with urination for the past 2 days. First started with some increased urinary frequency and urgency. He also noticed slight difficulty urinating. Then last evening, he had the urge to urinate and tried to pinch the tip of his penis/urethra closed to try to make it to the bathroom but he still experienced some urge incontinence. This morning, he had more dysuria and has had difficulty starting his stream and minimal urine output. He denies any fever, nausea, vomiting, abdominal pain or penile discharge. He has taken OTC Tylenol with minimal relief. He has a history of recurrent UTI's. Last UTI was in March 2024- he was placed on Cipro and his urine culture grew E.coli which was sensitive to most antibiotics. He also has a history of BPH and currently on Flomax daily. He has never seen a Insurance underwriter to his knowledge. Other chronic health issues include HTN which is controlled with Lisinopril, seizure disorder which is controlled with Keppra and glaucoma in which he is on numerous eye drops.   The history is provided by the patient.    Past Medical History:  Diagnosis Date   Allergy    Arthritis    Cataract    Glaucoma    Hypertension    Normocytic anemia 09/25/2012   Polyp, sigmoid colon 11/05/2011   Seizure (HCC)    Seizures (HCC) 03/17/2013    Patient Active Problem List   Diagnosis Date Noted   Acute UTI 05/12/2022   History of recurrent UTIs 05/12/2022   Dysuria 02/03/2022   Toenail fungus 07/30/2021   Left foot pain 04/16/2021   Deficiency anemia 10/13/2019   Cough 10/13/2019   Chronic idiopathic constipation 10/13/2019   Cervical radiculopathy 01/10/2019   Left lumbar radiculopathy 12/09/2017   Right hand pain 10/06/2017   Hearing  loss 09/09/2016   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   Encounter for general adult medical examination with abnormal findings 08/28/2015   Allergic rhinitis 02/26/2015   Glaucoma 05/07/2013   Essential hypertension, benign 05/07/2013   Seizures (HCC) 03/17/2013    Past Surgical History:  Procedure Laterality Date   ANTERIOR CERVICAL DECOMP/DISCECTOMY FUSION N/A 05/22/2017   Procedure: Anterior Cervical Discectomy Fusion - Cervical Three-Cervical Four - Cervical Four-Cervical Five;  Surgeon: Donalee Citrin, MD;  Location: [facility];  Service: Neurosurgery;  Laterality: N/A;   CATARACT EXTRACTION, BILATERAL     EYE SURGERY     GLAUCOMA REPAIR  10 yrs ago       Home Medications    Prior to Admission medications   Medication Sig Start Date End Date Taking? Authorizing Provider  cephALEXin (KEFLEX) 500 MG capsule Take 1 capsule (500 mg total) by mouth 2 (two) times daily for 7 days. 07/31/22 08/07/22 Yes Anjanette Gilkey, Ali Lowe, NP  acetaminophen (TYLENOL) 500 MG tablet Take 500 mg by mouth every 6 (six) hours as needed for moderate pain or headache.     [provider]  aspirin 81 MG tablet Take 81 mg by mouth daily.    [provider]  Aspirin-Calcium Carbonate 81-777 MG TABS Take by mouth.    [provider]  brimonidine (ALPHAGAN P) 0.1 % SOLN Place 1 drop 3 (three) times  daily into both eyes.    [provider]  cycloSPORINE, PF, (CEQUA) 0.09 % SOLN Apply 1 drop to eye 2 (two) times daily.    [provider]  latanoprost (XALATAN) 0.005 % ophthalmic solution Place 1 drop into both eyes at bedtime. 09/20/12   [provider]  levETIRAcetam (KEPPRA) 500 MG tablet TAKE 1 TABLET(500 MG) BY MOUTH TWICE DAILY 12/09/21   Levert Feinstein, MD  lisinopril (ZESTRIL) 20 MG tablet TAKE 1 TABLET(20 MG) BY MOUTH DAILY 03/31/22   Myrlene Broker, MD  magnesium hydroxide (MILK OF MAGNESIA) 400 MG/5ML suspension Take 30 mLs by mouth as needed  for mild constipation.    [provider]  Multiple Vitamin (MULTIVITAMIN) tablet Take 1 tablet by mouth daily.    [provider]  polyethylene glycol powder (GLYCOLAX/MIRALAX) 17 GM/SCOOP powder Take 17 g by mouth 2 (two) times daily as needed. 07/30/21   Myrlene Broker, MD  RESTASIS 0.05 % ophthalmic emulsion 1 drop 2 (two) times daily. 12/03/20   [provider]  tamsulosin (FLOMAX) 0.4 MG CAPS capsule TAKE 1 CAPSULE(0.4 MG) BY MOUTH DAILY 03/31/22   Myrlene Broker, MD  Tetrahydroz-Dextran-PEG-Povid 0.05-0.1-1-1 % SOLN     [provider]    Family History Family History  Problem Relation Age of Onset   Stroke Mother    High blood pressure Mother    Stroke Father    High blood pressure Father    Colon cancer Brother    Esophageal cancer Neg Hx    Pancreatic cancer Neg Hx    Rectal cancer Neg Hx    Stomach cancer Neg Hx     Social History Social History   Tobacco Use   Smoking status: Former    Packs/day: 1    Types: Cigarettes    Quit date: 02/18/2007    Years since quitting: 15.4   Smokeless tobacco: Never   Tobacco comments:    not sure but a while ago; quit smoking and ETOH  Vaping Use   Vaping Use: Never used  Substance Use Topics   Alcohol use: No    Alcohol/week: 0.0 standard drinks of alcohol    Comment: quit in 2009   Drug use: No     Allergies   Patient has no known allergies.   Review of Systems Review of Systems  Constitutional:  Negative for activity change, appetite change, chills, diaphoresis, fatigue and fever.  Gastrointestinal:  Negative for abdominal pain, nausea and vomiting.  Genitourinary:  Positive for decreased urine volume, difficulty urinating, dysuria, frequency, penile pain and urgency. Negative for flank pain, genital sores, hematuria, penile discharge, penile swelling, scrotal swelling and testicular pain.  Musculoskeletal:  Positive for arthralgias and back pain (chronic).  Skin:   Negative for color change and rash.  Allergic/Immunologic: Negative for environmental allergies and food allergies.  Neurological:  Positive for seizures (No recent seizure- on medication). Negative for dizziness, tremors, syncope, light-headedness and headaches.  Hematological:  Negative for adenopathy. Does not bruise/bleed easily.     Physical Exam Triage Vital Signs ED Triage Vitals [07/31/22 1451]  Enc Vitals Group     BP 133/69     Pulse Rate 63     Resp 16     Temp 98 F (36.7 C)     Temp Source Oral     SpO2 98 %     Weight      Height      Head Circumference  Peak Flow      Pain Score 8     Pain Loc      Pain Edu?      Excl. in GC?    No data found.  Updated Vital Signs BP 133/69 (BP Location: Right Arm)   Pulse 63   Temp 98 F (36.7 C) (Oral)   Resp 16   SpO2 98%   Visual Acuity Right Eye Distance:   Left Eye Distance:   Bilateral Distance:    Right Eye Near:   Left Eye Near:    Bilateral Near:     Physical Exam Vitals and nursing note reviewed. Chaperone present: patient declined chaperone.  Constitutional:      General: He is awake. He is not in acute distress.    Appearance: He is well-developed.     Comments: He is sitting in the exam chair in no acute distress, talking in complete sentences but slower to answer questions and appears slightly confused.   HENT:     Head: Normocephalic and atraumatic.     Right Ear: Hearing normal.     Left Ear: Hearing normal.  Cardiovascular:     Rate and Rhythm: Normal rate and regular rhythm.     Heart sounds: Normal heart sounds. No murmur heard. Pulmonary:     Effort: Pulmonary effort is normal. No respiratory distress.     Breath sounds: Normal breath sounds and air entry. No decreased air movement.  Abdominal:     General: Abdomen is flat. There is no distension.     Palpations: Abdomen is soft.     Tenderness: There is no abdominal tenderness. There is no right CVA tenderness, left CVA  tenderness, guarding or rebound.  Genitourinary:    Pubic Area: No rash.      Penis: Circumcised. No hypospadias, erythema, tenderness, discharge, swelling or lesions.      Testes: Normal.     Comments: No bruising or lesions on glans of penis. No urethral redness or discharge present.  Lymphadenopathy:     Lower Body: No right inguinal adenopathy. No left inguinal adenopathy.  Skin:    General: Skin is warm and dry.     Findings: No rash.  Neurological:     General: No focal deficit present.     Mental Status: He is alert and oriented to person, place, and time. He is confused.     Sensory: Sensation is intact.     Motor: Motor function is intact.  Psychiatric:        Attention and Perception: Attention normal.        Mood and Affect: Mood normal.        Behavior: Behavior is slowed. Behavior is cooperative.        Thought Content: Thought content normal.     Comments: Patient appeared slightly confused regarding current medications he is taking and history of urinary infections.       UC Treatments / Results  Labs (all labs ordered are listed, but only abnormal results are displayed) Labs Reviewed  POCT URINALYSIS DIP (MANUAL ENTRY) - Abnormal; Notable for the following components:      Result Value   Color, UA light yellow (*)    Nitrite, UA Positive (*)    Leukocytes, UA Small (1+) (*)    All other components within normal limits  URINE CULTURE    EKG   Radiology No results found.  Procedures Procedures (including critical care time)  Medications Ordered in UC Medications -  No data to display  Initial Impression / Assessment and Plan / UC Course  I have reviewed the triage vital signs and the nursing notes.  Pertinent labs & imaging results that were available during my care of the patient were reviewed by me and considered in my medical decision making (see chart for details).     Reviewed no distinct abnormal findings/bruising on clinical exam. Reviewed  urinalysis results which showed positive WBC's, positive nitrites but negative blood and protein. Discussed probable UTI. Reviewed with patient that UTI can cause increase in frequency, urgency and can decrease amount of urine. BPH can also cause some difficulty urinating. Will send urine for culture. Start Keflex 500mg  twice a day for 7 days. Encouraged to increase water and fluid intake. Continue Flomax daily as prescribed. Discussed possible need to see a Urologist with history of BPH and recurrent UTI's. Provided information on local Urologist for patient. Reviewed if penile pain and dysuria worsen or he is unable to urinate for over 8 hours, he needs to go to the ER ASAP. Otherwise, follow-up pending urine culture results and with a Urologist as needed.   Final Clinical Impressions(s) / UC Diagnoses   Final diagnoses:  Dysuria  Urinary urgency  History of BPH  History of recurrent UTI (urinary tract infection)     Discharge Instructions      Recommend start antibiotic Keflex 500mg  twice a day for 7 days. Continue to drink water and fluids to help with symptoms. Continue Flomax (Tamsulosin) once daily as prescribed. You may need to see a Urologist due to history of recurrent urinary tract infections and history of BPH- please call them in the next few days to schedule an appointment. If pain and symptoms get worse and you are not able to urinate at all, go to the ER ASAP. Otherwise, follow-up pending urine culture results.     ED Prescriptions     Medication Sig Dispense Auth. Provider   cephALEXin (KEFLEX) 500 MG capsule Take 1 capsule (500 mg total) by mouth 2 (two) times daily for 7 days. 14 capsule Edna Grover, Ali Lowe, NP      PDMP not reviewed this encounter.   Sudie Grumbling, NP 07/31/22 2137

## 2022-07-31 NOTE — ED Triage Notes (Signed)
Patient presents after last night he was trying to hold his urine by pinching the tip of his penis to try to make it to the bathroom, and the foce of the stream was too hard and pushed through his clenching.  He then woke up in the morning with some pain where he was holding and painful urination with difficulty sarting his stream and a slow dribble now.;

## 2022-08-01 ENCOUNTER — Ambulatory Visit: Payer: Medicare Other | Admitting: Internal Medicine

## 2022-08-02 LAB — URINE CULTURE: Culture: >=100000 — AB

## 2022-08-02 NOTE — Progress Notes (Signed)
Message sent to pt via My Chart

## 2022-08-04 ENCOUNTER — Inpatient Hospital Stay (HOSPITAL_COMMUNITY)
Admission: EM | Admit: 2022-08-04 | Discharge: 2022-08-07 | DRG: 713 | Disposition: A | Discharge status: Home / Self Care | Payer: Medicare Other | Attending: Internal Medicine | Admitting: Internal Medicine

## 2022-08-04 ENCOUNTER — Encounter: Payer: Self-pay | Admitting: Internal Medicine

## 2022-08-04 ENCOUNTER — Emergency Department (HOSPITAL_COMMUNITY): Payer: Medicare Other

## 2022-08-04 ENCOUNTER — Other Ambulatory Visit: Payer: Self-pay

## 2022-08-04 ENCOUNTER — Ambulatory Visit (INDEPENDENT_AMBULATORY_CARE_PROVIDER_SITE_OTHER): Payer: Medicare Other | Admitting: Internal Medicine

## 2022-08-04 ENCOUNTER — Encounter (HOSPITAL_COMMUNITY): Payer: Self-pay | Admitting: *Deleted

## 2022-08-04 VITALS — BP 160/80 | HR 68 | Temp 98.5°F | Ht 67.0 in | Wt 144.0 lb

## 2022-08-04 DIAGNOSIS — H409 Unspecified glaucoma: Secondary | ICD-10-CM | POA: Diagnosis present

## 2022-08-04 DIAGNOSIS — G40909 Epilepsy, unspecified, not intractable, without status epilepticus: Secondary | ICD-10-CM | POA: Diagnosis present

## 2022-08-04 DIAGNOSIS — N12 Tubulo-interstitial nephritis, not specified as acute or chronic: Principal | ICD-10-CM

## 2022-08-04 DIAGNOSIS — Z9842 Cataract extraction status, left eye: Secondary | ICD-10-CM | POA: Diagnosis not present

## 2022-08-04 DIAGNOSIS — Z87891 Personal history of nicotine dependence: Secondary | ICD-10-CM | POA: Diagnosis not present

## 2022-08-04 DIAGNOSIS — R339 Retention of urine, unspecified: Secondary | ICD-10-CM | POA: Diagnosis not present

## 2022-08-04 DIAGNOSIS — E871 Hypo-osmolality and hyponatremia: Secondary | ICD-10-CM | POA: Diagnosis not present

## 2022-08-04 DIAGNOSIS — Z1611 Resistance to penicillins: Secondary | ICD-10-CM | POA: Diagnosis not present

## 2022-08-04 DIAGNOSIS — Z9841 Cataract extraction status, right eye: Secondary | ICD-10-CM

## 2022-08-04 DIAGNOSIS — N401 Enlarged prostate with lower urinary tract symptoms: Secondary | ICD-10-CM | POA: Diagnosis present

## 2022-08-04 DIAGNOSIS — I10 Essential (primary) hypertension: Secondary | ICD-10-CM | POA: Diagnosis not present

## 2022-08-04 DIAGNOSIS — D649 Anemia, unspecified: Secondary | ICD-10-CM | POA: Diagnosis not present

## 2022-08-04 DIAGNOSIS — N138 Other obstructive and reflux uropathy: Secondary | ICD-10-CM

## 2022-08-04 DIAGNOSIS — N10 Acute tubulo-interstitial nephritis: Secondary | ICD-10-CM | POA: Diagnosis not present

## 2022-08-04 DIAGNOSIS — K5904 Chronic idiopathic constipation: Secondary | ICD-10-CM

## 2022-08-04 DIAGNOSIS — I700 Atherosclerosis of aorta: Secondary | ICD-10-CM | POA: Diagnosis not present

## 2022-08-04 DIAGNOSIS — N134 Hydroureter: Secondary | ICD-10-CM | POA: Diagnosis not present

## 2022-08-04 DIAGNOSIS — D638 Anemia in other chronic diseases classified elsewhere: Secondary | ICD-10-CM | POA: Diagnosis present

## 2022-08-04 DIAGNOSIS — Z823 Family history of stroke: Secondary | ICD-10-CM | POA: Diagnosis not present

## 2022-08-04 DIAGNOSIS — Z87898 Personal history of other specified conditions: Secondary | ICD-10-CM

## 2022-08-04 DIAGNOSIS — N390 Urinary tract infection, site not specified: Secondary | ICD-10-CM

## 2022-08-04 DIAGNOSIS — N320 Bladder-neck obstruction: Secondary | ICD-10-CM | POA: Diagnosis not present

## 2022-08-04 DIAGNOSIS — Z8719 Personal history of other diseases of the digestive system: Secondary | ICD-10-CM

## 2022-08-04 DIAGNOSIS — Z7982 Long term (current) use of aspirin: Secondary | ICD-10-CM

## 2022-08-04 DIAGNOSIS — N5089 Other specified disorders of the male genital organs: Secondary | ICD-10-CM | POA: Diagnosis present

## 2022-08-04 DIAGNOSIS — N136 Pyonephrosis: Secondary | ICD-10-CM | POA: Diagnosis not present

## 2022-08-04 DIAGNOSIS — Z87440 Personal history of urinary (tract) infections: Secondary | ICD-10-CM

## 2022-08-04 DIAGNOSIS — Z981 Arthrodesis status: Secondary | ICD-10-CM

## 2022-08-04 DIAGNOSIS — Z79899 Other long term (current) drug therapy: Secondary | ICD-10-CM | POA: Diagnosis not present

## 2022-08-04 DIAGNOSIS — Z91128 Patient's intentional underdosing of medication regimen for other reason: Secondary | ICD-10-CM

## 2022-08-04 DIAGNOSIS — R338 Other retention of urine: Secondary | ICD-10-CM | POA: Diagnosis not present

## 2022-08-04 DIAGNOSIS — N412 Abscess of prostate: Principal | ICD-10-CM | POA: Diagnosis present

## 2022-08-04 DIAGNOSIS — K5900 Constipation, unspecified: Secondary | ICD-10-CM | POA: Diagnosis present

## 2022-08-04 LAB — COMPREHENSIVE METABOLIC PANEL
ALT: 16 U/L (ref 0–44)
AST: 24 U/L (ref 15–41)
Albumin: 4 g/dL (ref 3.5–5.0)
Alkaline Phosphatase: 33 U/L — ABNORMAL LOW (ref 38–126)
Anion gap: 11 (ref 5–15)
BUN: 13 mg/dL (ref 8–23)
CO2: 23 mmol/L (ref 22–32)
Calcium: 9.3 mg/dL (ref 8.9–10.3)
Chloride: 95 mmol/L — ABNORMAL LOW (ref 98–111)
Creatinine, Ser: 1.32 mg/dL — ABNORMAL HIGH (ref 0.61–1.24)
GFR, Estimated: 54 mL/min — ABNORMAL LOW (ref >60)
Glucose, Bld: 103 mg/dL — ABNORMAL HIGH (ref 70–99)
Potassium: 4 mmol/L (ref 3.5–5.1)
Sodium: 129 mmol/L — ABNORMAL LOW (ref 135–145)
Total Bilirubin: 0.9 mg/dL (ref 0.3–1.2)
Total Protein: 7.5 g/dL (ref 6.5–8.1)

## 2022-08-04 LAB — CBC
HCT: 35.1 % — ABNORMAL LOW (ref 39.0–52.0)
Hemoglobin: 11.5 g/dL — ABNORMAL LOW (ref 13.0–17.0)
MCH: 28.2 pg (ref 26.0–34.0)
MCHC: 32.8 g/dL (ref 30.0–36.0)
MCV: 86 fL (ref 80.0–100.0)
Platelets: 180 10*3/uL (ref 150–400)
RBC: 4.08 MIL/uL — ABNORMAL LOW (ref 4.22–5.81)
RDW: 14 % (ref 11.5–15.5)
WBC: 8 10*3/uL (ref 4.0–10.5)
nRBC: 0 % (ref 0.0–0.2)

## 2022-08-04 LAB — URINALYSIS, ROUTINE W REFLEX MICROSCOPIC
Bacteria, UA: NONE SEEN
Bilirubin Urine: NEGATIVE
Glucose, UA: NEGATIVE mg/dL
Ketones, ur: NEGATIVE mg/dL
Leukocytes,Ua: NEGATIVE
Nitrite: NEGATIVE
Protein, ur: NEGATIVE mg/dL
RBC / HPF: >50 RBC/hpf (ref 0–5)
Specific Gravity, Urine: 1.019 (ref 1.005–1.030)
pH: 6 (ref 5.0–8.0)

## 2022-08-04 LAB — MAGNESIUM: Magnesium: 2.2 mg/dL (ref 1.7–2.4)

## 2022-08-04 LAB — TYPE AND SCREEN

## 2022-08-04 MED ORDER — TAMSULOSIN HCL 0.4 MG PO CAPS
0.4000 mg | ORAL_CAPSULE | Freq: Every day | ORAL | Status: DC
  Administered 2022-08-05 – 2022-08-07 (×4): 0.4 mg via ORAL
  Filled 2022-08-04 (×4): qty 1

## 2022-08-04 MED ORDER — PIPERACILLIN-TAZOBACTAM 3.375 G IVPB
3.3750 g | Freq: Three times a day (TID) | INTRAVENOUS | Status: DC
  Administered 2022-08-04 – 2022-08-07 (×7): 3.375 g via INTRAVENOUS
  Filled 2022-08-04 (×9): qty 50

## 2022-08-04 MED ORDER — IOHEXOL 350 MG/ML SOLN
75.0000 mL | Freq: Once | INTRAVENOUS | Status: AC | PRN
  Administered 2022-08-04: 75 mL via INTRAVENOUS

## 2022-08-04 MED ORDER — ONDANSETRON HCL 4 MG/2ML IJ SOLN: 4.0000 mg | Freq: Four times a day (QID) | INTRAMUSCULAR | Status: DC | PRN

## 2022-08-04 MED ORDER — FENTANYL CITRATE PF 50 MCG/ML IJ SOSY: 25.0000 ug | PREFILLED_SYRINGE | INTRAMUSCULAR | Status: DC | PRN

## 2022-08-04 MED ORDER — SULFAMETHOXAZOLE-TRIMETHOPRIM 800-160 MG PO TABS: 1.0000 | ORAL_TABLET | Freq: Two times a day (BID) | ORAL | 0 refills | Status: DC

## 2022-08-04 MED ORDER — LIDOCAINE HCL URETHRAL/MUCOSAL 2 % EX GEL
1.0000 | Freq: Once | CUTANEOUS | Status: AC
  Administered 2022-08-04: 1 via TOPICAL
  Filled 2022-08-04: qty 11

## 2022-08-04 MED ORDER — TAMSULOSIN HCL 0.4 MG PO CAPS: 0.4000 mg | ORAL_CAPSULE | Freq: Two times a day (BID) | ORAL | 1 refills | Status: DC

## 2022-08-04 MED ORDER — MELATONIN 3 MG PO TABS: 3.0000 mg | ORAL_TABLET | Freq: Every evening | ORAL | Status: DC | PRN

## 2022-08-04 MED ORDER — ACETAMINOPHEN 325 MG PO TABS
650.0000 mg | ORAL_TABLET | Freq: Once | ORAL | Status: AC
  Administered 2022-08-04: 650 mg via ORAL
  Filled 2022-08-04: qty 2

## 2022-08-04 MED ORDER — SODIUM CHLORIDE 0.9 % IV BOLUS
500.0000 mL | Freq: Once | INTRAVENOUS | Status: AC
  Administered 2022-08-04: 500 mL via INTRAVENOUS

## 2022-08-04 MED ORDER — ACETAMINOPHEN 325 MG PO TABS: 650.0000 mg | ORAL_TABLET | Freq: Four times a day (QID) | ORAL | Status: DC | PRN

## 2022-08-04 MED ORDER — LACTATED RINGERS IV SOLN: INTRAVENOUS | Status: AC

## 2022-08-04 MED ORDER — LUBIPROSTONE 24 MCG PO CAPS: 24.0000 ug | ORAL_CAPSULE | Freq: Two times a day (BID) | ORAL | 3 refills | Status: DC

## 2022-08-04 MED ORDER — ACETAMINOPHEN 650 MG RE SUPP: 650.0000 mg | Freq: Four times a day (QID) | RECTAL | Status: DC | PRN

## 2022-08-04 MED ORDER — HYDRALAZINE HCL 20 MG/ML IJ SOLN: 10.0000 mg | INTRAMUSCULAR | Status: DC | PRN

## 2022-08-04 MED ORDER — NALOXONE HCL 0.4 MG/ML IJ SOLN: 0.4000 mg | INTRAMUSCULAR | Status: DC | PRN

## 2022-08-04 MED ORDER — LEVETIRACETAM IN NACL 500 MG/100ML IV SOLN
500.0000 mg | Freq: Two times a day (BID) | INTRAVENOUS | Status: DC
  Administered 2022-08-05 – 2022-08-07 (×6): 500 mg via INTRAVENOUS
  Filled 2022-08-04 (×7): qty 100

## 2022-08-04 NOTE — ED Notes (Signed)
Foley attempted with Counselling psychologist. Unable to advance catheter at this time. Mercadez Heitman PA made aware of unsuccessful attempts.

## 2022-08-04 NOTE — Assessment & Plan Note (Signed)
For unclear reason he is no longer on amitiza 24 mcg BID which had been very effective. Last BM this morning which was normal size. Rx amitiza 24 mcg BID today and advised to resume. Chronic constipation can impact bladder emptying so encouraged him to take this regularly. He is using otc currently advised to continue and start to cut back once on amitiza if going too often.

## 2022-08-04 NOTE — Assessment & Plan Note (Signed)
Recent UTI diagnosed at urgent care and culture suggests that keflex is effective however he has not had any improvement and some worsening of symptoms and has been on for 3-4 days already. Will stop keflex and change to bactrim 5 day course.

## 2022-08-04 NOTE — ED Triage Notes (Signed)
Here by POV from home with family, here for urinary retention, onset yesterday, last void this am, gradually progressively less urine, "just a trickle". Endorses real bad pain".

## 2022-08-04 NOTE — H&P (Signed)
History and Physical      Evan Escobar ZOX:[id number] DOB: 01/28/1941 DOA: 08/04/2022; DOS: 08/04/2022 PCP: Myrlene Broker, MD  Patient coming from: home   I have personally briefly reviewed patient's old medical records in [facility]  Chief Complaint: Diminished urine output  HPI: Evan Escobar is a 82 y.o. male with medical history significant for essential pretension, seizure disorder, BPH, anemia of chronic disease associated baseline hemoglobin 11-12, who is admitted to  [facility] on 08/04/2022 with bilateral pyelonephritis in the setting of bladder outlet obstruction stemming from prostatic abscess after presenting from home to [facility] ED complaining of diminished urine output.   The patient reports 1 week of diminished urine output, conveying that he feels the urgency to urinate, but is able to incompletely empty his bladder.  Over the last few days he has then developed some generalized abdominal discomfort, including sharp, nonradiating bilateral lateral flank pain as well as some mild dysuria when he is able to pass urine.  Denies any associated gross hematuria.  Denies associated subjective fever, chills, rigors, or generalized myalgias.  He carries a diagnosis of underlying BPH for which she is on Flomax, and also has a history of recurrent urinary tract infections.  Per chart review, urine culture collected on 07/31/2022 demonstrated greater than 100,000 colonies of Klebsiella pneumonia, which was noted to have intermediate sensitivity to nitrofurantoin as well as resistance to ampicillin, but without otherwise pansensitive, including sensitive to Zosyn and Bactrim.  It appears that he was started on Bactrim for urinary tract infection following the results of the 07/31/2022 urine culture.  Denies any recent chest pain, shortness of breath, palpitations, diaphoresis, nausea, vomiting, dizziness, presyncope, or syncope.  No recent orthopnea, PND, or worsening of  peripheral edema.  Is on a daily baby aspirin, but otherwise no blood thinners as an outpatient.     ED Course:  Postvoid residual scan noted to be 600 cc.   Vital signs in the ED were notable for the following: Afebrile; heart rates in the 60s to 80s; systolic blood pressures in the 150s to 180s; respiratory rate 18-23, oxygen saturation 98 to 100% on room air.  Labs were notable for the following: CMP notable for the following: Sodium 129 compared to most recent prior value of 136 in February 2024, chloride 95, carbon 23, creatinine 1.32 compared to most recent prior value 1.20 in February 2024, glucose 103, liver enzymes within normal limits.  CBC notable for white blood cell count 8000, hemoglobin 11.5 associated normocytic/recurrent properties as well as nonelevated RDW and relative demonstration prior hemoglobin of 11.8 and 5 or 2024.  Urinalysis notable for no white blood cells, leukocyte esterase/nitrate negative and no evidence of squamous epithelial cells.  Imaging and additional notable ED work-up: CT abdomen/pelvis with contrast showed evidence of bilateral pyelonephritis as well as peripherally enhancing fluid collection in the superior prostate measuring up to 3.2 cm, concerning for abscess.  This imaging also showed distended bladder with mild bilateral hydroureteronephrosis concerning for bladder outlet obstruction secondary to prostate abscess.  EDP discussed patient's case with on-call urology, Dr. Annabell Howells, who conveyed that he will formally consult and see the patient in the morning.  He requested that the patient be made n.p.o. at midnight, and that the patient be admitted to [facility] specifically.   While in the ED, the following were administered: Acetaminophen 650 mg p.o. x 1, Zosyn.  Of note, Foley catheter was placed in the ED today.  Subsequently, the patient  was admitted to [facility] for further evaluation management of presenting bilateral pyelonephritis in the  setting of prostatic abscess, with resultant acute urinary retention.     Review of Systems: As per HPI otherwise 10 point review of systems negative.   Past Medical History:  Diagnosis Date   Allergy    Arthritis    Cataract    Glaucoma    Hypertension    Normocytic anemia 09/25/2012   Polyp, sigmoid colon 11/05/2011   Seizure (HCC)    Seizures (HCC) 03/17/2013    Past Surgical History:  Procedure Laterality Date   ANTERIOR CERVICAL DECOMP/DISCECTOMY FUSION N/A 05/22/2017   Procedure: Anterior Cervical Discectomy Fusion - Cervical Three-Cervical Four - Cervical Four-Cervical Five;  Surgeon: Donalee Citrin, MD;  Location: [facility] OR;  Service: Neurosurgery;  Laterality: N/A;   CATARACT EXTRACTION, BILATERAL     EYE SURGERY     GLAUCOMA REPAIR  10 yrs ago    Social History:  reports that he quit smoking about 15 years ago. His smoking use included cigarettes. He smoked an average of 1 pack per day. He has never used smokeless tobacco. He reports that he does not drink alcohol and does not use drugs.   No Known Allergies  Family History  Problem Relation Age of Onset   Stroke Mother    High blood pressure Mother    Stroke Father    High blood pressure Father    Colon cancer Brother    Esophageal cancer Neg Hx    Pancreatic cancer Neg Hx    Rectal cancer Neg Hx    Stomach cancer Neg Hx     Family history reviewed and not pertinent    Prior to Admission medications   Medication Sig Start Date End Date Taking? Authorizing Provider  acetaminophen (TYLENOL) 500 MG tablet Take 500 mg by mouth every 6 (six) hours as needed for moderate pain or headache.    Yes [provider]  aspirin 81 MG tablet Take 81 mg by mouth daily.   Yes [provider]  brimonidine (ALPHAGAN P) 0.1 % SOLN Place 1 drop into both eyes 2 (two) times daily.   Yes [provider]  cycloSPORINE (RESTASIS) 0.05 % ophthalmic emulsion Place 1 drop into both eyes 2 (two) times daily.   Yes  [provider]  latanoprost (XALATAN) 0.005 % ophthalmic solution Place 1 drop into both eyes at bedtime. 09/20/12  Yes [provider]  levETIRAcetam (KEPPRA) 500 MG tablet TAKE 1 TABLET(500 MG) BY MOUTH TWICE DAILY Patient taking differently: Take 500 mg by mouth 2 (two) times daily. 12/09/21  Yes Levert Feinstein, MD  lisinopril (ZESTRIL) 20 MG tablet TAKE 1 TABLET(20 MG) BY MOUTH DAILY Patient taking differently: Take 20 mg by mouth daily. 03/31/22  Yes Myrlene Broker, MD  magnesium hydroxide (MILK OF MAGNESIA) 400 MG/5ML suspension Take 30 mLs by mouth daily as needed for mild constipation.   Yes [provider]  Multiple Vitamin (MULTIVITAMIN) tablet Take 1 tablet by mouth daily.   Yes [provider]  polyethylene glycol powder (GLYCOLAX/MIRALAX) 17 GM/SCOOP powder Take 17 g by mouth 2 (two) times daily as needed. Patient taking differently: Take 17 g by mouth 2 (two) times daily as needed for moderate constipation. 07/30/21  Yes Myrlene Broker, MD  tamsulosin (FLOMAX) 0.4 MG CAPS capsule Take 1 capsule (0.4 mg total) by mouth in the morning and at bedtime. Patient taking differently: Take 0.4 mg by mouth daily. 08/04/22  Yes Myrlene Broker, MD  lubiprostone (AMITIZA) 24 MCG capsule Take 1 capsule (24 mcg total) by mouth 2 (two) times daily with a meal. 08/04/22   Myrlene Broker, MD  sulfamethoxazole-trimethoprim (BACTRIM DS) 800-160 MG tablet Take 1 tablet by mouth 2 (two) times daily for 5 days. 08/04/22 08/09/22  Myrlene Broker, MD     Objective    Physical Exam: Vitals:   08/04/22 1930 08/04/22 2015 08/04/22 2044 08/04/22 2100  BP: (!) 172/84 (!) 157/77  (!) 155/92  Pulse: 79 70  85  Resp: 20 19  17   Temp:   98.7 F (37.1 C)   TempSrc:   Oral   SpO2: 98% 99%  98%    General: appears to be stated age; alert, oriented Skin: warm, dry, no rash Head:  AT/Home Mouth:  Oral mucosa membranes appear dry, normal  dentition Neck: supple; trachea midline Heart:  RRR; did not appreciate any M/R/G Lungs: CTAB, did not appreciate any wheezes, rales, or rhonchi Abdomen: + BS; soft, ND, NT Vascular: 2+ pedal pulses b/l; 2+ radial pulses b/l Extremities: no peripheral edema, no muscle wasting Neuro: strength and sensation intact in upper and lower extremities b/l    Labs on Admission: I have personally reviewed following labs and imaging studies  CBC: Recent Labs  Lab 08/04/22 1433  WBC 8.0  HGB 11.5*  HCT 35.1*  MCV 86.0  PLT 180   Basic Metabolic Panel: Recent Labs  Lab 08/04/22 1433  NA 129*  K 4.0  CL 95*  CO2 23  GLUCOSE 103*  BUN 13  CREATININE 1.32*  CALCIUM 9.3   GFR: Estimated Creatinine Clearance: 40.5 mL/min (A) (by C-G formula based on SCr of 1.32 mg/dL (H)). Liver Function Tests: Recent Labs  Lab 08/04/22 1433  AST 24  ALT 16  ALKPHOS 33*  BILITOT 0.9  PROT 7.5  ALBUMIN 4.0   No results for input(s): "LIPASE", "AMYLASE" in the last 168 hours. No results for input(s): "AMMONIA" in the last 168 hours. Coagulation Profile: No results for input(s): "INR", "PROTIME" in the last 168 hours. Cardiac Enzymes: No results for input(s): "CKTOTAL", "CKMB", "CKMBINDEX", "TROPONINI" in the last 168 hours. BNP (last 3 results) No results for input(s): "PROBNP" in the last 8760 hours. HbA1C: No results for input(s): "HGBA1C" in the last 72 hours. CBG: No results for input(s): "GLUCAP" in the last 168 hours. Lipid Profile: No results for input(s): "CHOL", "HDL", "LDLCALC", "TRIG", "CHOLHDL", "LDLDIRECT" in the last 72 hours. Thyroid Function Tests: No results for input(s): "TSH", "T4TOTAL", "FREET4", "T3FREE", "THYROIDAB" in the last 72 hours. Anemia Panel: No results for input(s): "VITAMINB12", "FOLATE", "FERRITIN", "TIBC", "IRON", "RETICCTPCT" in the last 72 hours. Urine analysis:    Component Value Date/Time   COLORURINE YELLOW 08/04/2022 1944   APPEARANCEUR CLEAR  08/04/2022 1944   LABSPEC 1.019 08/04/2022 1944   PHURINE 6.0 08/04/2022 1944   GLUCOSEU NEGATIVE 08/04/2022 1944   GLUCOSEU NEGATIVE 04/07/2022 1121   HGBUR LARGE (A) 08/04/2022 1944   BILIRUBINUR NEGATIVE 08/04/2022 1944   BILIRUBINUR negative 07/31/2022 1459   BILIRUBINUR negative 05/12/2022 1018   KETONESUR NEGATIVE 08/04/2022 1944   PROTEINUR NEGATIVE 08/04/2022 1944   UROBILINOGEN 0.2 07/31/2022 1459   UROBILINOGEN 0.2 04/07/2022 1121   NITRITE NEGATIVE 08/04/2022 1944   LEUKOCYTESUR NEGATIVE 08/04/2022 1944    Radiological Exams on Admission: CT ABDOMEN PELVIS W CONTRAST  Result Date: 08/04/2022 CLINICAL DATA:  Urinary retention EXAM: CT ABDOMEN AND PELVIS WITH CONTRAST TECHNIQUE: Multidetector CT  imaging of the abdomen and pelvis was performed using the standard protocol following bolus administration of intravenous contrast. RADIATION DOSE REDUCTION: This exam was performed according to the departmental dose-optimization program which includes automated exposure control, adjustment of the mA and/or kV according to patient size and/or use of iterative reconstruction technique. CONTRAST:  75mL OMNIPAQUE IOHEXOL 350 MG/ML SOLN COMPARISON:  Abdomen radiographs 10/13/2019 FINDINGS: Lower chest: No acute abnormality. Hepatobiliary: Scattered tiny hypodensities are too small to definitively characterize but likely represent cysts. Irregular hypoattenuation along the falciform ligament likely due to fatty infiltration. Unremarkable gallbladder and biliary tree. Pancreas: Unremarkable. Spleen: Unremarkable. Adrenals/Urinary Tract: Normal adrenal glands. Symmetric nonspecific perinephric stranding. Mild bilateral hydroureteronephrosis. Geographic areas of cortical hypoattenuation/hypoenhancement on delayed images compatible with pyelonephritis. Distended bladder. Stomach/Bowel: Normal caliber large and small bowel. No bowel wall thickening. Stomach is within normal limits. Small hiatal hernia.  Vascular/Lymphatic: Aortic atherosclerosis. No enlarged abdominal or pelvic lymph nodes. Reproductive: Enlarged prostate. Within the superior prostate there is a peripherally enhancing fluid collection measuring 3.0 x 2.6 x 3.2 cm. Moderate bilateral hydroceles. Other: No free intraperitoneal fluid or air. Musculoskeletal: Thoracolumbar spondylosis. IMPRESSION: 1. Bilateral pyelonephritis. 2. Peripherally enhancing fluid collection in the superior prostate measuring up to 3.2 cm concerning for abscess. 3. Distended bladder with mild bilateral hydroureteronephrosis. Question bladder outlet obstruction secondary to prostate abscess. 4. Moderate bilateral hydroceles. Aortic Atherosclerosis (ICD10-I70.0). Electronically Signed   By: Minerva Fester M.D.   On: 08/04/2022 19:32      Assessment/Plan   Principal Problem:   Acute pyelonephritis Active Problems:   Essential hypertension   Prostate abscess   Acute urinary retention   History of seizures   Anemia of chronic disease      #) Acute bilateral pyelonephritis: Observed on today's CT abdomen/pelvis in the setting of recent development of bilateral flank discomfort.  Suspect that this is on the basis of new finding of bladder outlet obstruction secondary to prostate abscess, with supporting evidence in the form of newly identified bilateral hydroureteronephrosis.  Patient's urinalysis does not appear to be infected, but analysis of such as the caveat of recent diagnosis of Klebsiella urinary tract infection on 07/31/2022, with interval use of Bactrim.  Symptoms been initiated for prostatic abscess, while noting that he recently identified Klebsiella urinary tract infection was sensitive to such.  In the absence of objective fever or leukocytosis, SIRS criteria not currently met for sepsis.  He appears hemodynamically stable.  Plan: Lactated Ringer's at 100 cc/h x 12 hours.  Monitor strict I's and O's and daily weights.  Add on urine culture.   Further evaluation management of underlying prostatic abscess, including formal urology consultation, noting that case has been discussed with Dr.**Of urology, who is planning on seeing the patient over at [facility] long.  N.p.o. at midnight for this.  Prn IV fentanyl.  As needed IV Zofran for anion.  Repeat CMP, CBC in the morning.            #) Prostate abscess: Today CT abdomen/pelvis suggestive of a 3.2 cm superior prostate abscess, as further detailed above, which appears to be causing a bladder outlet obstruction resulting in acute urinary retention ultimately contributing to the aforementioned bilateral pyelonephritis.  Does not appear septic, as further detailed above.  Was started on Zosyn.  EDP discussed patient's case with on-call urology, Dr. Annabell Howells, who requested [facility] admission to [facility] and conveyed that he will formally consult to the patient in the morning.  He requested the patient be made  n.p.o. at midnight  [facility] Score for this patient in the context of anticipated aforementioned surgery conveys a  0.56% perioperative risk for significant cardiac event. No evidence to suggest acutely decompensated heart failure or acute MI. Consequently, no absolute contraindications to proceeding with proposed surgery at this time.   Plan: Gentle IV fluids, as above.  Add on urine culture.  Continue Zosyn, as above.  Urology formally consult, as above.  N.p.o. at midnight.  Prn IV Zofran.  CMP, CBC in the morning.  Check INR.  Check preoperative EKG.  Admitting to [facility], as above.             #) Acute urinary retention: Over the course of last week, patient reports progressive difficulty in passing urine, noting the urgency to urinate, but difficulty in passage of urine, with postvoid residual scan in the ED demonstrating 600 cc of urine.  This appears to be as a consequence of new bladder outlet obstruction stemming from prostatic abscess, superimposed on documented history  of underlying BPH, for which the patient is on Flomax as outpatient.  Urology to formally consult at [facility] long, as above.  Foley catheter was placed in the ED this evening.  Plan: Urology to formally consult was a long, as above.  Monitor strict I's and O's and daily weights.  Gentle IV fluids, as above.  Continue home Flomax.  Repeat CMP in the morning.               #) Essential Hypertension: documented h/o such, with outpatient antihypertensive regimen including lisinopril, which the patient is already taken today.  He is also noted to be on Flomax as an outpatient.  SBP's in the ED today: 150s to 180s mmHg.   Plan: Close monitoring of subsequent BP via routine VS. holding home lisinopril for now in the setting of impending n.p.o. status.                 #) History of seizures: Documented history of such, without clinical evidence to suggest active seizures at this time.  Outpatient antiepileptic regimen consists of: Keppra 500 mg p.o. twice daily.  In the setting of impending n.p.o. status, I have transiently transitioned the patient's home oral Keppra to IV route of administration.   Plan:  Hold home Keppra for now in the setting of n.p.o. status.  Instead, Keppra 500 mg IV twice daily for now.                  #) Anemia of chronic disease: Documented history of such, a/w with baseline hgb range 11-12, with presenting hgb consistent with this range, in the absence of any overt evidence of active bleed.     Plan: Repeat CBC in the morning.  Check INR.  In the setting of anticipated impending urology procedure, also ordered type and screen.        DVT prophylaxis: SCD's   Code Status: Full code Family Communication: none Disposition Plan: Per Rounding Team Consults called: EDP d/w on-call urology, Dr. Annabell Howells, who will formally consult, as further detailed above;  Admission status: Inpatient to [facility]     I SPENT GREATER THAN 75   MINUTES IN CLINICAL CARE TIME/MEDICAL DECISION-MAKING IN COMPLETING THIS ADMISSION.      Chaney Born Grethel Zenk DO [facility]  From 7PM - 7AM   08/04/2022, 9:15 PM

## 2022-08-04 NOTE — Assessment & Plan Note (Signed)
With new issues on being unable to urinate. Last urinated this morning. Increasing flomax to 0.4 mg BID. He used to be on cardura 4 mg daily as well but not in some time. He has been to urology previously but not in years. Referral done to urology. New rx for flomax 0.4 mg BID. We are changing antibiotics to address UTI.

## 2022-08-04 NOTE — ED Provider Triage Note (Signed)
Emergency Medicine Provider Triage Evaluation Note  Evan Escobar , a 82 y.o. male  was evaluated in triage. H/o BPH on nightly tamsulosin. Pt complains of difficulty urinating for 2 days accompanied with increasing suprapubic abdominal pain. Last urination was this morning but only a "dribble". Also endorses burning pain when urinating for the past week or so. Had been placed on Keflex since 6/13, no improvement in symptoms so switched to Bactrim by PCP  today. Last bowel movement this morning.    Was seen by PCP this morning who also set up referral to urology and increased tamsulosin to BID   Review of Systems  Positive: As above Negative: As above  Physical Exam  BP (!) 169/80   Pulse 75   Temp 98.5 F (36.9 C) (Oral)   Resp 18   SpO2 100%  Gen:   Awake, no distress   Resp:  Normal effort  MSK:   Moves extremities without difficulty  Other:  Regular rate and rhythm   Medical Decision Making  Medically screening exam initiated at 2:07 PM.  Appropriate orders placed.  Quatez Hilt was informed that the remainder of the evaluation will be completed by another provider, this initial triage assessment does not replace that evaluation, and the importance of remaining in the ED until their evaluation is complete.     Arabella Merles, PA-C 08/04/22 1421

## 2022-08-04 NOTE — Patient Instructions (Addendum)
We will increase the tamsulosin (flomax) to twice a day to help with being able to go to urinate (pee).   We will change the antibiotic to see if this helps. We have sent in bactrim to take 1 pill twice a day for 5 days. Stop taking keflex (cephalexin).   We will get you back in with the urologist to see long term how we can keep this from happening again.  We have sent in the amititza for the bowels to help with constipation to take twice a day.

## 2022-08-04 NOTE — Progress Notes (Signed)
Pharmacy Antibiotic Note  Evan Escobar is a 82 y.o. male admitted on 08/04/2022 for urinary retention with prostate abscess. He was previously treated with keflex for a UTI, but changed to Bactrim by his provider. Pharmacy has been consulted for zosyn dosing. WBC 8, sCr 1.32 (bl~1-1.2), afebrile. Imaging shows bilateral pyelonephritis with prostate abscess.  Plan: Zosyn 3.375g IV every 8 hours  Monitor renal function Monitor for signs of clinical improvement, WBC, fever curve, and intervention for antibiotic plan and duration     Temp (24hrs), Avg:98.3 F (36.8 C), Min:98 F (36.7 C), Max:98.5 F (36.9 C)  Recent Labs  Lab 08/04/22 1433  WBC 8.0  CREATININE 1.32*    Estimated Creatinine Clearance: 40.5 mL/min (A) (by C-G formula based on SCr of 1.32 mg/dL (H)).    No Known Allergies  Antimicrobials this admission: Zosyn 6/17 >>   Thank you for allowing pharmacy to be a part of this patient's care.  Arabella Merles, PharmD. [facility] PGY-1 08/04/2022 7:55 PM

## 2022-08-04 NOTE — ED Notes (Signed)
Pt alert, NAD, calm, interactive, resps e/u. Preparing to re-attempt foley catheter. Pt to CT now via stretcher.

## 2022-08-04 NOTE — Progress Notes (Signed)
   Subjective:   Patient ID: Evan Escobar, male    DOB: [birth year], 82 y.o.   MRN: [id number]  HPI The patient is an 81 YO man coming in for chronic constipation and new problems with urination in the last day or so. Seen in urgent care for UTI and started on 1 week keflex starting 07/31/22. Has not seen any improvement with symptoms and now cannot urinate. Chronic constipation not on amitiza lately unsure why not or when he stopped.   Review of Systems  Constitutional: Negative.   HENT: Negative.    Eyes: Negative.   Respiratory:  Negative for cough, chest tightness and shortness of breath.   Cardiovascular:  Negative for chest pain, palpitations and leg swelling.  Gastrointestinal:  Positive for constipation. Negative for abdominal distention, abdominal pain, diarrhea, nausea and vomiting.  Genitourinary:  Positive for difficulty urinating, dysuria, frequency and urgency.  Musculoskeletal: Negative.   Skin: Negative.   Neurological: Negative.   Psychiatric/Behavioral: Negative.      Objective:  Physical Exam Constitutional:      Appearance: He is well-developed.  HENT:     Head: Normocephalic and atraumatic.  Cardiovascular:     Rate and Rhythm: Normal rate and regular rhythm.  Pulmonary:     Effort: Pulmonary effort is normal. No respiratory distress.     Breath sounds: Normal breath sounds. No wheezing or rales.  Abdominal:     General: Bowel sounds are normal. There is no distension.     Palpations: Abdomen is soft.     Tenderness: There is no abdominal tenderness. There is no rebound.  Musculoskeletal:     Cervical back: Normal range of motion.  Skin:    General: Skin is warm and dry.  Neurological:     Mental Status: He is alert and oriented to person, place, and time.     Coordination: Coordination normal.     Vitals:   08/04/22 0816  BP: (!) 160/80  Pulse: 68  Temp: 98.5 F (36.9 C)  TempSrc: Oral  SpO2: 96%  Weight: 144 lb (65.3 kg)  Height: 5\' 7"   (1.702 m)    Assessment & Plan:  Visit time 25 minutes in face to face communication with patient and coordination of care, additional 5 minutes spent in record review, coordination or care, ordering tests, communicating/referring to other healthcare professionals, documenting in medical records all on the same day of the visit for total time 30 minutes spent on the visit.

## 2022-08-04 NOTE — ED Provider Notes (Signed)
[place] EMERGENCY DEPARTMENT AT [facility] Provider Note   CSN: [id number] Arrival date & time: 08/04/22  1312     History  Chief Complaint  Patient presents with   Urinary Retention    Evan Escobar is a 82 y.o. male, history of BPH, who presents to the ED secondary to urinary retention for the last 9 hours.  He states for the last 9 hours he has not been unable to urinate, yesterday he was dribbling, and this morning when he is unable to urinate he became very concerned.  He states he is currently being treated for a UTI, was initially started on Keflex, but now changed to Bactrim per his internal medicine doctor.  He states that it is very painful, and the tip of his penis hurts.  Denies any concern for STDs.  Also reports that he is concerned for constipation, last bowel movement was this a.m., however he feels very constipated.  Has not been taking his medications as prescribed.    Home Medications Prior to Admission medications   Medication Sig Start Date End Date Taking? Authorizing Provider  acetaminophen (TYLENOL) 500 MG tablet Take 500 mg by mouth every 6 (six) hours as needed for moderate pain or headache.    Yes [provider]  aspirin 81 MG tablet Take 81 mg by mouth daily.   Yes [provider]  brimonidine (ALPHAGAN P) 0.1 % SOLN Place 1 drop into both eyes 2 (two) times daily.   Yes [provider]  cycloSPORINE (RESTASIS) 0.05 % ophthalmic emulsion Place 1 drop into both eyes 2 (two) times daily.   Yes [provider]  latanoprost (XALATAN) 0.005 % ophthalmic solution Place 1 drop into both eyes at bedtime. 09/20/12  Yes [provider]  levETIRAcetam (KEPPRA) 500 MG tablet TAKE 1 TABLET(500 MG) BY MOUTH TWICE DAILY Patient taking differently: Take 500 mg by mouth 2 (two) times daily. 12/09/21  Yes Levert Feinstein, MD  lisinopril (ZESTRIL) 20 MG tablet TAKE 1 TABLET(20 MG) BY MOUTH DAILY Patient taking differently:  Take 20 mg by mouth daily. 03/31/22  Yes Myrlene Broker, MD  magnesium hydroxide (MILK OF MAGNESIA) 400 MG/5ML suspension Take 30 mLs by mouth daily as needed for mild constipation.   Yes [provider]  Multiple Vitamin (MULTIVITAMIN) tablet Take 1 tablet by mouth daily.   Yes [provider]  polyethylene glycol powder (GLYCOLAX/MIRALAX) 17 GM/SCOOP powder Take 17 g by mouth 2 (two) times daily as needed. Patient taking differently: Take 17 g by mouth 2 (two) times daily as needed for moderate constipation. 07/30/21  Yes Myrlene Broker, MD  tamsulosin (FLOMAX) 0.4 MG CAPS capsule Take 1 capsule (0.4 mg total) by mouth in the morning and at bedtime. Patient taking differently: Take 0.4 mg by mouth daily. 08/04/22  Yes Myrlene Broker, MD  lubiprostone (AMITIZA) 24 MCG capsule Take 1 capsule (24 mcg total) by mouth 2 (two) times daily with a meal. 08/04/22   Myrlene Broker, MD  sulfamethoxazole-trimethoprim (BACTRIM DS) 800-160 MG tablet Take 1 tablet by mouth 2 (two) times daily for 5 days. 08/04/22 08/09/22  Myrlene Broker, MD      Allergies    Patient has no known allergies.    Review of Systems   Review of Systems  Gastrointestinal:  Positive for nausea.  Genitourinary:  Positive for decreased urine volume, difficulty urinating and penile pain. Negative for penile swelling and scrotal swelling.    Physical Exam  Updated Vital Signs BP 138/77   Pulse 80   Temp 98.7 F (37.1 C) (Oral)   Resp 19   SpO2 98%  Physical Exam Vitals and nursing note reviewed. Exam conducted with a chaperone present.  Constitutional:      General: He is not in acute distress.    Appearance: He is well-developed.  HENT:     Head: Normocephalic and atraumatic.  Eyes:     Conjunctiva/sclera: Conjunctivae normal.  Cardiovascular:     Rate and Rhythm: Normal rate and regular rhythm.     Heart sounds: No murmur heard. Pulmonary:     Effort: Pulmonary effort  is normal. No respiratory distress.     Breath sounds: Normal breath sounds.  Abdominal:     Palpations: Abdomen is soft.     Tenderness: There is generalized abdominal tenderness.  Genitourinary:    Penis: Normal.   Musculoskeletal:        General: No swelling.     Cervical back: Neck supple.  Skin:    General: Skin is warm and dry.     Capillary Refill: Capillary refill takes less than 2 seconds.  Neurological:     Mental Status: He is alert.  Psychiatric:        Mood and Affect: Mood normal.     ED Results / Procedures / Treatments   Labs (all labs ordered are listed, but only abnormal results are displayed) Labs Reviewed  COMPREHENSIVE METABOLIC PANEL - Abnormal; Notable for the following components:      Result Value   Sodium 129 (*)    Chloride 95 (*)    Glucose, Bld 103 (*)    Creatinine, Ser 1.32 (*)    Alkaline Phosphatase 33 (*)    GFR, Estimated 54 (*)    All other components within normal limits  CBC - Abnormal; Notable for the following components:   RBC 4.08 (*)    Hemoglobin 11.5 (*)    HCT 35.1 (*)    All other components within normal limits  URINALYSIS, ROUTINE W REFLEX MICROSCOPIC - Abnormal; Notable for the following components:   Hgb urine dipstick LARGE (*)    All other components within normal limits  CULTURE, OB URINE  CBC WITH DIFFERENTIAL/PLATELET  COMPREHENSIVE METABOLIC PANEL  MAGNESIUM  MAGNESIUM  PROTIME-INR  TYPE AND SCREEN    EKG None  Radiology CT ABDOMEN PELVIS W CONTRAST  Result Date: 08/04/2022 CLINICAL DATA:  Urinary retention EXAM: CT ABDOMEN AND PELVIS WITH CONTRAST TECHNIQUE: Multidetector CT imaging of the abdomen and pelvis was performed using the standard protocol following bolus administration of intravenous contrast. RADIATION DOSE REDUCTION: This exam was performed according to the departmental dose-optimization program which includes automated exposure control, adjustment of the mA and/or kV according to patient  size and/or use of iterative reconstruction technique. CONTRAST:  75mL OMNIPAQUE IOHEXOL 350 MG/ML SOLN COMPARISON:  Abdomen radiographs 10/13/2019 FINDINGS: Lower chest: No acute abnormality. Hepatobiliary: Scattered tiny hypodensities are too Ura Yingling to definitively characterize but likely represent cysts. Irregular hypoattenuation along the falciform ligament likely due to fatty infiltration. Unremarkable gallbladder and biliary tree. Pancreas: Unremarkable. Spleen: Unremarkable. Adrenals/Urinary Tract: Normal adrenal glands. Symmetric nonspecific perinephric stranding. Mild bilateral hydroureteronephrosis. Geographic areas of cortical hypoattenuation/hypoenhancement on delayed images compatible with pyelonephritis. Distended bladder. Stomach/Bowel: Normal caliber large and [place]we bowel. No bowel wall thickening. Stomach is within normal limits. Kashvi Prevette hiatal hernia. Vascular/Lymphatic: Aortic atherosclerosis. No enlarged abdominal or pelvic lymph nodes. Reproductive: Enlarged prostate. Within the superior prostate there is  a peripherally enhancing fluid collection measuring 3.0 x 2.6 x 3.2 cm. Moderate bilateral hydroceles. Other: No free intraperitoneal fluid or air. Musculoskeletal: Thoracolumbar spondylosis. IMPRESSION: 1. Bilateral pyelonephritis. 2. Peripherally enhancing fluid collection in the superior prostate measuring up to 3.2 cm concerning for abscess. 3. Distended bladder with mild bilateral hydroureteronephrosis. Question bladder outlet obstruction secondary to prostate abscess. 4. Moderate bilateral hydroceles. Aortic Atherosclerosis (ICD10-I70.0). Electronically Signed   By: Minerva Fester M.D.   On: 08/04/2022 19:32    Procedures Procedures    Medications Ordered in ED Medications  piperacillin-tazobactam (ZOSYN) IVPB 3.375 g (3.375 g Intravenous New Bag/Given 08/04/22 2001)  acetaminophen (TYLENOL) tablet 650 mg (has no administration in time range)    Or  acetaminophen (TYLENOL)  suppository 650 mg (has no administration in time range)  melatonin tablet 3 mg (has no administration in time range)  ondansetron (ZOFRAN) injection 4 mg (has no administration in time range)  naloxone [facility]) injection 0.4 mg (has no administration in time range)  fentaNYL (SUBLIMAZE) injection 25 mcg (has no administration in time range)  lactated ringers infusion (has no administration in time range)  levETIRAcetam (KEPPRA) IVPB 500 mg/100 mL premix (has no administration in time range)  hydrALAZINE (APRESOLINE) injection 10 mg (has no administration in time range)  tamsulosin (FLOMAX) capsule 0.4 mg (has no administration in time range)  acetaminophen (TYLENOL) tablet 650 mg (650 mg Oral Given 08/04/22 1914)  sodium chloride 0.9 % bolus 500 mL (0 mLs Intravenous Stopped 08/04/22 1959)  lidocaine (XYLOCAINE) 2 % jelly 1 Application (1 Application Topical Given 08/04/22 1914)  iohexol (OMNIPAQUE) 350 MG/ML injection 75 mL (75 mLs Intravenous Contrast Given 08/04/22 1859)    ED Course/ Medical Decision Making/ A&P Clinical Course as of 08/04/22 2238  Mon Aug 04, 2022  1953 This is a 82 year old woman presented ED with urinary difficulty, found to have an enlarged prostate with concern for an abscess on CT imaging.  A Foley was placed and subsequently able to relieve the bladder, abdominal pain and pressure he was experiencing.  We will discuss with the on-call urologist and have ordered IV antibiotics.  He was also noted to be hyponatremic.  Creatinine near baseline at 1.3.  He has no leukocytosis or fever to suggest bacteremia or sepsis.  He is stable at this time [MT]    Clinical Course User Index [MT] Terald Sleeper, MD                             Medical Decision Making Is an 82 year old male, here for inability urinate for the last 10 hours.  Has recently been treated for UTI twice, currently on Bactrim, is having abdominal pain, inability urinate for the last 10 hours.  We obtained  a bladder scan, per nursing patient unable to urinate, has 600+ milliliters in bladder per bladder scan.  We will cath patient, obtain CT to evaluate for possible large amount of stool, causing difficulty urinating  Amount and/or Complexity of Data Reviewed Radiology: ordered.    Details: CT scan shows evidence of an abscess in the prostate, as well as bilateral pyelonephritis Discussion of management or test interpretation with external provider(s): Discussed with patient, has bilateral pyelonephritis as well as abscess in the prostate, we are able to achieve a catheter in the bladder, prior to the evidence of the CT scan with evidence of the abscess.  CT shows evidence of an abscess in the prostate, as  well as bilateral pyelonephritis, we will start on Zosyn, I discussed this with Dr. Annabell Howells, and he request patient be [facility] to [facility] for possible intervention in the a.m., be kept n.p.o. at midnight, and I admitted to hospitalist, Dr. Arlean Hopping.  He will require further urologic evaluation, IV antibiotics, and further use of a catheter for drainage.  Risk OTC drugs. Prescription drug management. Decision regarding hospitalization.    Final Clinical Impression(s) / ED Diagnoses Final diagnoses:  Pyelonephritis  Prostate abscess    Rx / DC Orders ED Discharge Orders     None         Pete Pelt, PA 08/04/22 2238    Terald Sleeper, MD 08/05/22 1450

## 2022-08-05 ENCOUNTER — Other Ambulatory Visit: Payer: Self-pay | Admitting: Urology

## 2022-08-05 DIAGNOSIS — N10 Acute tubulo-interstitial nephritis: Secondary | ICD-10-CM | POA: Diagnosis not present

## 2022-08-05 LAB — COMPREHENSIVE METABOLIC PANEL
ALT: 12 U/L (ref 0–44)
AST: 18 U/L (ref 15–41)
Albumin: 3.5 g/dL (ref 3.5–5.0)
Alkaline Phosphatase: 29 U/L — ABNORMAL LOW (ref 38–126)
Anion gap: 7 (ref 5–15)
BUN: 16 mg/dL (ref 8–23)
CO2: 25 mmol/L (ref 22–32)
Calcium: 9 mg/dL (ref 8.9–10.3)
Chloride: 104 mmol/L (ref 98–111)
Creatinine, Ser: 1.22 mg/dL (ref 0.61–1.24)
GFR, Estimated: 60 mL/min — ABNORMAL LOW (ref >60)
Glucose, Bld: 87 mg/dL (ref 70–99)
Potassium: 4.3 mmol/L (ref 3.5–5.1)
Sodium: 136 mmol/L (ref 135–145)
Total Bilirubin: 0.8 mg/dL (ref 0.3–1.2)
Total Protein: 6.8 g/dL (ref 6.5–8.1)

## 2022-08-05 LAB — CBC WITH DIFFERENTIAL/PLATELET
Abs Immature Granulocytes: 0.03 10*3/uL (ref 0.00–0.07)
Basophils Absolute: 0 10*3/uL (ref 0.0–0.1)
Basophils Relative: 0 %
Eosinophils Absolute: 0.1 10*3/uL (ref 0.0–0.5)
Eosinophils Relative: 1 %
HCT: 33.5 % — ABNORMAL LOW (ref 39.0–52.0)
Hemoglobin: 11 g/dL — ABNORMAL LOW (ref 13.0–17.0)
Immature Granulocytes: 1 %
Lymphocytes Relative: 16 %
Lymphs Abs: 1 10*3/uL (ref 0.7–4.0)
MCH: 28 pg (ref 26.0–34.0)
MCHC: 32.8 g/dL (ref 30.0–36.0)
MCV: 85.2 fL (ref 80.0–100.0)
Monocytes Absolute: 1.2 10*3/uL — ABNORMAL HIGH (ref 0.1–1.0)
Monocytes Relative: 18 %
Neutro Abs: 4.2 10*3/uL (ref 1.7–7.7)
Neutrophils Relative %: 64 %
Platelets: 165 10*3/uL (ref 150–400)
RBC: 3.93 MIL/uL — ABNORMAL LOW (ref 4.22–5.81)
RDW: 14.1 % (ref 11.5–15.5)
WBC: 6.5 10*3/uL (ref 4.0–10.5)
nRBC: 0 % (ref 0.0–0.2)

## 2022-08-05 LAB — TYPE AND SCREEN: ABO/RH(D): A POS

## 2022-08-05 LAB — MAGNESIUM: Magnesium: 2.2 mg/dL (ref 1.7–2.4)

## 2022-08-05 LAB — PROTIME-INR
INR: 1.2 (ref 0.8–1.2)
Prothrombin Time: 15.2 seconds (ref 11.4–15.2)

## 2022-08-05 MED ORDER — SODIUM CHLORIDE 0.9 % IV SOLN: INTRAVENOUS | Status: DC

## 2022-08-05 NOTE — Progress Notes (Signed)
Mobility Specialist - Progress Note   08/05/22 1203  Mobility  Activity Ambulated with assistance in hallway  Level of Assistance Standby assist, set-up cues, supervision of patient - no hands on  Assistive Device Front wheel walker  Distance Ambulated (ft) 500 ft  Activity Response Tolerated well  Mobility Referral Yes  $Mobility charge 1 Mobility  Mobility Specialist Start Time (ACUTE ONLY) 1146  Mobility Specialist Stop Time (ACUTE ONLY) 1202  Mobility Specialist Time Calculation (min) (ACUTE ONLY) 16 min   Pt received in bed and agreeable to mobility. C/o some discomfort with foley, but stated that it felt better with ambulation. No other complaints during session. Pt to bed after session with all needs met. Bed alarm on.    [facility]

## 2022-08-05 NOTE — TOC Initial Note (Signed)
Transition of Care [facility]) - Initial/Assessment Note    Patient Details  Name: Evan Escobar MRN: [id number] Date of Birth: Jun 10, 1940  [facility] CM/SW Contact:    Howell Rucks, RN Phone Number: 08/05/2022, 9:46 AM  Clinical Narrative:   Met with pt/spouse at bedside to introduce role of TOC/NCM and review for dc needs, spouse reports pt has a PCP and pharmacy in place, uses cane for functional mobility, reports no home health services at this time, reports has transportation available at discharge. Pt'pt's spouse had no questions/concerns. TOC will continue to follow.                 Expected Discharge Plan: Home/Self Care Barriers to Discharge: Continued Medical Work up   Patient Goals and CMS Choice            Expected Discharge Plan and Services   Discharge Planning Services: CM Consult   Living arrangements for the past 2 months: Single Family Home                                      Prior Living Arrangements/Services Living arrangements for the past 2 months: Single Family Home Lives with:: Relatives Patient language and need for interpreter reviewed:: Yes Do you feel safe going back to the place where you live?: Yes      Need for Family Participation in Patient Care: Yes (Comment) Care giver support system in place?: Yes (comment) Current home services: (S) DME (cane) Criminal Activity/Legal Involvement Pertinent to Current Situation/Hospitalization: No - Comment as needed  Activities of Daily Living Home Assistive Devices/Equipment: None ADL Screening (condition at time of admission) Patient's cognitive ability adequate to safely complete daily activities?: Yes Is the patient deaf or have difficulty hearing?: Yes Does the patient have difficulty seeing, even when wearing glasses/contacts?: No Does the patient have difficulty concentrating, remembering, or making decisions?: No Patient able to express need for assistance with ADLs?:  No Does the patient have difficulty dressing or bathing?: No Independently performs ADLs?: Yes (appropriate for developmental age) Communication: Independent Dressing (OT): Independent Grooming: Independent Feeding: Independent Bathing: Independent Toileting: Independent In/Out Bed: Independent Walks in Home: Independent Does the patient have difficulty walking or climbing stairs?: No Weakness of Legs: Both Weakness of Arms/Hands: None  Permission Sought/Granted Permission sought to share information with : Case Manager Permission granted to share information with : Yes, Verbal Permission Granted  Share Information with NAME: Fannie Knee, RN           Emotional Assessment Appearance:: Appears stated age Attitude/Demeanor/Rapport: Gracious Affect (typically observed): Accepting Orientation: : Oriented to Self, Oriented to Place Alcohol / Substance Use: Not Applicable Psych Involvement: No (comment)  Admission diagnosis:  Prostate abscess [N41.2] Pyelonephritis [N12] Acute pyelonephritis [N10] Patient Active Problem List   Diagnosis Date Noted   Acute pyelonephritis 08/04/2022   Prostate abscess 08/04/2022   Acute urinary retention 08/04/2022   History of seizures 08/04/2022   Anemia of chronic disease 08/04/2022   Acute UTI 05/12/2022   History of recurrent UTIs 05/12/2022   Toenail fungus 07/30/2021   Left foot pain 04/16/2021   Deficiency anemia 10/13/2019   Cough 10/13/2019   Chronic idiopathic constipation 10/13/2019   Cervical radiculopathy 01/10/2019   Left lumbar radiculopathy 12/09/2017   Right hand pain 10/06/2017   Hearing loss 09/09/2016   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   Encounter  for general adult medical examination with abnormal findings 08/28/2015   Allergic rhinitis 02/26/2015   Glaucoma 05/07/2013   Essential hypertension 05/07/2013   Seizures (HCC) 03/17/2013   PCP:  Myrlene Broker, MD Pharmacy:    [facility] [address] [place] - [address] AT [facility] [address] [place] [address] Phone: [phone] Fax: [phone]     Social Determinants of Health (SDOH) Social History: SDOH Screenings   Food Insecurity: No Food Insecurity (08/04/2022)  Housing: Low Risk  (08/04/2022)  Transportation Needs: No Transportation Needs (08/04/2022)  Utilities: Not At Risk (08/04/2022)  Depression (PHQ2-9): Low Risk  (08/04/2022)  Financial Resource Strain: Low Risk  (05/30/2020)  Tobacco Use: Medium Risk (08/04/2022)   SDOH Interventions:     Readmission Risk Interventions    08/05/2022    9:44 AM  Readmission Risk Prevention Plan  Post Dischage Appt Complete  Medication Screening Complete  Transportation Screening Complete

## 2022-08-05 NOTE — Consult Note (Signed)
Urology Consult  Referring physician: Dr. Dolphus Jenny Reason for referral: Urinary retention  Chief Complaint: Urinary retention  History of Present Illness:  Evan Escobar is an 82 year old male presenting to [facility] emergency department with complaint of diminished urine output 2/2 bladder outlet obstruction D/T prostatic abscess.  He was ultimately transferred to [facility] for surgical consideration.  Patient was unable to provide timelines other than a significant worsening of symptoms over the past week.  He reports severe nocturia, having to return to the bathroom almost as soon as he sits back on the bed.  He reports feeling of incomplete emptying and difficulty getting his stream started.  He denies hematuria.  PMH significant for BPH for which he takes Flomax.  He also has a history of urinary tract infections.    Past Medical History:  Diagnosis Date   Allergy    Arthritis    Cataract    Glaucoma    Hypertension    Normocytic anemia 09/25/2012   Polyp, sigmoid colon 11/05/2011   Seizure (HCC)    Seizures (HCC) 03/17/2013   Past Surgical History:  Procedure Laterality Date   ANTERIOR CERVICAL DECOMP/DISCECTOMY FUSION N/A 05/22/2017   Procedure: Anterior Cervical Discectomy Fusion - Cervical Three-Cervical Four - Cervical Four-Cervical Five;  Surgeon: Donalee Citrin, MD;  Location: [facility] OR;  Service: Neurosurgery;  Laterality: N/A;   CATARACT EXTRACTION, BILATERAL     EYE SURGERY     GLAUCOMA REPAIR  10 yrs ago    Medications: I have reviewed the patient's current medications. Allergies: No Known Allergies  Family History  Problem Relation Age of Onset   Stroke Mother    High blood pressure Mother    Stroke Father    High blood pressure Father    Colon cancer Brother    Esophageal cancer Neg Hx    Pancreatic cancer Neg Hx    Rectal cancer Neg Hx    Stomach cancer Neg Hx    Social History:  reports that he quit smoking about 15 years ago. His smoking use included  cigarettes. He smoked an average of 1 pack per day. He has never used smokeless tobacco. He reports that he does not drink alcohol and does not use drugs.  Review of Systems  Genitourinary:  Positive for dysuria, frequency and urgency.     Physical Exam:  Vital signs in last 24 hours: Temp:  [98 F (36.7 C)-98.7 F (37.1 C)] 98.3 F (36.8 C) (06/18 0506) Pulse Rate:  [62-85] 83 (06/18 0506) Resp:  [17-23] 18 (06/18 0506) BP: (136-204)/(72-92) 136/76 (06/18 0506) SpO2:  [98 %-100 %] 99 % (06/17 2241) Weight:  [64.5 kg-66.1 kg] 66.1 kg (06/18 0500)  Cardiovascular: Skin warm; not flushed Respiratory: Breaths quiet; no shortness of breath Abdomen: No masses, soft, no guarding or rebound Neurological: Normal sensation to touch Musculoskeletal: Normal motor function arms and legs Skin: No rashes Genitourinary:37f coud catheter in place draining clear yellow urine.  Laboratory Data:  Results for orders placed or performed during the hospital encounter of 08/04/22 (from the past 72 hour(s))  Comprehensive metabolic panel     Status: Abnormal   Collection Time: 08/04/22  2:33 PM  Result Value Ref Range   Sodium 129 (L) 135 - 145 mmol/L   Potassium 4.0 3.5 - 5.1 mmol/L   Chloride 95 (L) 98 - 111 mmol/L   CO2 23 22 - 32 mmol/L   Glucose, Bld 103 (H) 70 - 99 mg/dL    Comment: Glucose reference  range applies only to samples taken after fasting for at least 8 hours.   BUN 13 8 - 23 mg/dL   Creatinine, Ser 1.61 (H) 0.61 - 1.24 mg/dL   Calcium 9.3 8.9 - 09.6 mg/dL   Total Protein 7.5 6.5 - 8.1 g/dL   Albumin 4.0 3.5 - 5.0 g/dL   AST 24 15 - 41 U/L   ALT 16 0 - 44 U/L   Alkaline Phosphatase 33 (L) 38 - 126 U/L   Total Bilirubin 0.9 0.3 - 1.2 mg/dL   GFR, Estimated 54 (L) >60 mL/min    Comment: (NOTE) Calculated using the CKD-EPI Creatinine Equation (2021)    Anion gap 11 5 - 15    Comment: Performed at [facility], [address] [place], [place] [address]  CBC      Status: Abnormal   Collection Time: 08/04/22  2:33 PM  Result Value Ref Range   WBC 8.0 4.0 - 10.5 K/uL    Comment: WHITE COUNT CONFIRMED ON SMEAR   RBC 4.08 (L) 4.22 - 5.81 MIL/uL   Hemoglobin 11.5 (L) 13.0 - 17.0 g/dL   HCT 98.1 (L) 19.1 - 47.8 %   MCV 86.0 80.0 - 100.0 fL   MCH 28.2 26.0 - 34.0 pg   MCHC 32.8 30.0 - 36.0 g/dL   RDW 29.5 62.1 - 30.8 %   Platelets 180 150 - 400 K/uL   nRBC 0.0 0.0 - 0.2 %    Comment: Performed at [facility], [address] [place], [place] [address]  Urinalysis, Routine w reflex microscopic -Urine, Clean Catch     Status: Abnormal   Collection Time: 08/04/22  7:44 PM  Result Value Ref Range   Color, Urine YELLOW YELLOW   APPearance CLEAR CLEAR   Specific Gravity, Urine 1.019 1.005 - 1.030   pH 6.0 5.0 - 8.0   Glucose, UA NEGATIVE NEGATIVE mg/dL   Hgb urine dipstick LARGE (A) NEGATIVE   Bilirubin Urine NEGATIVE NEGATIVE   Ketones, ur NEGATIVE NEGATIVE mg/dL   Protein, ur NEGATIVE NEGATIVE mg/dL   Nitrite NEGATIVE NEGATIVE   Leukocytes,Ua NEGATIVE NEGATIVE   RBC / HPF >50 0 - 5 RBC/hpf   WBC, UA 0-5 0 - 5 WBC/hpf   Bacteria, UA NONE SEEN NONE SEEN   Squamous Epithelial / HPF 0-5 0 - 5 /HPF    Comment: Performed at [facility], [address], [place], [place] [address]  Magnesium     Status: None   Collection Time: 08/04/22 11:09 PM  Result Value Ref Range   Magnesium 2.2 1.7 - 2.4 mg/dL    Comment: Performed at [facility] [place] [facility], [address] [place], [place] [address]  Type and screen [facility]     Status: None   Collection Time: 08/04/22 11:09 PM  Result Value Ref Range   ABO/RH(D) A POS    Antibody Screen NEG    Sample Expiration      08/07/2022,2359 Performed at [facility], [address], [place], [place] [address]   CBC with Differential/Platelet     Status: Abnormal   Collection Time: 08/05/22  5:07 AM  Result Value Ref Range   WBC 6.5 4.0 - 10.5 K/uL    RBC 3.93 (L) 4.22 - 5.81 MIL/uL   Hemoglobin 11.0 (L) 13.0 - 17.0 g/dL   HCT 10.2 (L) 72.5 - 36.6 %   MCV 85.2 80.0 - 100.0 fL   MCH  28.0 26.0 - 34.0 pg   MCHC 32.8 30.0 - 36.0 g/dL   RDW 16.1 09.6 - 04.5 %   Platelets 165 150 - 400 K/uL   nRBC 0.0 0.0 - 0.2 %   Neutrophils Relative % 64 %   Neutro Abs 4.2 1.7 - 7.7 K/uL   Lymphocytes Relative 16 %   Lymphs Abs 1.0 0.7 - 4.0 K/uL   Monocytes Relative 18 %   Monocytes Absolute 1.2 (H) 0.1 - 1.0 K/uL   Eosinophils Relative 1 %   Eosinophils Absolute 0.1 0.0 - 0.5 K/uL   Basophils Relative 0 %   Basophils Absolute 0.0 0.0 - 0.1 K/uL   Immature Granulocytes 1 %   Abs Immature Granulocytes 0.03 0.00 - 0.07 K/uL   Giant PLTs PRESENT     Comment: Performed at [facility], [address] [place], [place] [address]  Comprehensive metabolic panel     Status: Abnormal   Collection Time: 08/05/22  5:07 AM  Result Value Ref Range   Sodium 136 135 - 145 mmol/L    Comment: DELTA CHECK NOTED   Potassium 4.3 3.5 - 5.1 mmol/L   Chloride 104 98 - 111 mmol/L   CO2 25 22 - 32 mmol/L   Glucose, Bld 87 70 - 99 mg/dL    Comment: Glucose reference range applies only to samples taken after fasting for at least 8 hours.   BUN 16 8 - 23 mg/dL   Creatinine, Ser 1.91 0.61 - 1.24 mg/dL   Calcium 9.0 8.9 - 47.8 mg/dL   Total Protein 6.8 6.5 - 8.1 g/dL   Albumin 3.5 3.5 - 5.0 g/dL   AST 18 15 - 41 U/L   ALT 12 0 - 44 U/L   Alkaline Phosphatase 29 (L) 38 - 126 U/L   Total Bilirubin 0.8 0.3 - 1.2 mg/dL   GFR, Estimated 60 (L) >60 mL/min    Comment: (NOTE) Calculated using the CKD-EPI Creatinine Equation (2021)    Anion gap 7 5 - 15    Comment: Performed at [facility], [address], [place], [place] [address]  Magnesium     Status: None   Collection Time: 08/05/22  5:07 AM  Result Value Ref Range   Magnesium 2.2 1.7 - 2.4 mg/dL    Comment: Performed at [facility], [address]  [place], [place] [address]  Protime-INR     Status: None   Collection Time: 08/05/22  5:07 AM  Result Value Ref Range   Prothrombin Time 15.2 11.4 - 15.2 seconds   INR 1.2 0.8 - 1.2    Comment: (NOTE) INR goal varies based on device and disease states. Performed at [facility], [address] [place], [place] [address]    Recent Results (from the past 240 hour(s))  Urine Culture     Status: Abnormal   Collection Time: 07/31/22  3:18 PM   Specimen: Urine, Clean Catch  Result Value Ref Range Status   Specimen Description URINE, CLEAN CATCH  Final   Special Requests   Final    NONE Performed at [facility], [address], [place], [place] [address]    Culture >=100,000 COLONIES/mL KLEBSIELLA PNEUMONIAE (A)  Final   Report Status 08/02/2022 FINAL  Final   Organism ID, Bacteria KLEBSIELLA PNEUMONIAE (A)  Final      Susceptibility   Klebsiella pneumoniae - MIC*    AMPICILLIN RESISTANT Resistant  CEFAZOLIN <=4 SENSITIVE Sensitive     CEFEPIME <=0.12 SENSITIVE Sensitive     CEFTRIAXONE <=0.25 SENSITIVE Sensitive     CIPROFLOXACIN <=0.25 SENSITIVE Sensitive     GENTAMICIN <=1 SENSITIVE Sensitive     IMIPENEM <=0.25 SENSITIVE Sensitive     NITROFURANTOIN 64 INTERMEDIATE Intermediate     TRIMETH/SULFA <=20 SENSITIVE Sensitive     AMPICILLIN/SULBACTAM <=2 SENSITIVE Sensitive     PIP/TAZO <=4 SENSITIVE Sensitive     * >=100,000 COLONIES/mL KLEBSIELLA PNEUMONIAE   Creatinine: Recent Labs    08/04/22 1433 08/05/22 0507  CREATININE 1.32* 1.22    Imaging:  See report/chart Independent review of CT A/P shows prostatomegaly with roughly 3 x 3 fluid collections likely representing abscess.  His bladder is markedly distended with prostate encroaching into the bladder outlet, with associated hydronephrosis.  Assessment/Plan:   #prostate abscess #bladder outlet obstruction N.P.O at midnight To the OR tomorrow for TURP to unroof prostatic  abscess Normothermic, WBC WNL. No immediate concern for systemic infection Abscess contents to be cultured from OR  #UTI Recent urine culture on 6/13 positive for Klebsiella pneumonia.  Agree with broad-spectrum ABX at this time     Scherrie Bateman Azaya Goedde 08/05/2022, 9:05 AM  Pager: [phone]

## 2022-08-05 NOTE — Progress Notes (Signed)
PHARMACY NOTE -  Zosyn  Pharmacy has been assisting with dosing of Zosyn for prostatic abscess . Dosage remains stable at 3.375 g IV q8 hr and further renal adjustments per institutional Pharmacy antibiotic protocol  Pharmacy will sign off, following peripherally for culture results, dose adjustments, and length of therapy. Please reconsult if a change in clinical status warrants re-evaluation of dosage.  Bernadene Person, PharmD, BCPS [phone] 08/05/2022, 12:34 PM

## 2022-08-05 NOTE — Progress Notes (Signed)
PROGRESS NOTE  Evan Escobar  DOB: 05-Jul-1940  PCP: Myrlene Broker, MD ZOX:[id number]  DOA: 08/04/2022  LOS: 1 day  Hospital Day: 2  Brief narrative: Evan Escobar is a 82 y.o. male with PMH significant for BPH, recurrent UTI, HTN, seizure, chronic anemia. 6/17, patient presented to the ED with complaint of inability to void and progressive abdominal pain. Symptoms started a week ago and progressively worsened. 6/13, urine culture was done which resulted on 6/15 showing more than 10 thousand CFU per mL of Klebsiella pneumoniae.  Started on a course of Bactrim.  Symptoms worsened and hence presented to the ED  In the ED, afebrile, heart rate in 70s, blood pressure elevated as high as 204/77 Bladder scan showed urinary retention.  Foley catheter was inserted draining more than 600 mL of urine. Labs showed WBC count normal at 8, sodium low at 129, BUN/creatinine 13/1.32 Urinalysis showed clear yellow urine with large amount of hemoglobin, no leukocytes, nitrate or bacteria noted CT abdomen/pelvis with contrast showed evidence of bilateral pyelonephritis, distended bladder with mild bilateral hydroureteronephrosis concerning for bladder outlet obstruction secondary to prostate abscess.  EDP discussed the case with on-call urology, Dr. Annabell Howells. Recommended IV antibiotics, n.p.o. with possible plan of intervention in the morning. Patient was started on IV Zosyn Admitted to [facility]  Subjective: Patient was seen and examined this morning.  Pleasant elderly African-American male.  Lying on bed.  Not in distress.  Clinically.  Feels better than at presentation.  Wife at bedside. Chart reviewed. Remains afebrile, blood pressure improving.  Hemodynamically stable. Lab this morning with improvement in sodium level and creatinine.  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  Urology plans to operate tomorrow tentatively.  N.p.o. after midnight On  IV antibiotics Pain control with as needed Tylenol.  I would avoid fentanyl PRN in this elderly patient. Recent Labs  Lab 08/04/22 1433 08/05/22 0507  WBC 8.0 6.5   Acute bilateral pyelonephritis Recent Klebsiella UTI CT abdomen noted pyelonephritis as well On IV Zosyn at this time  Acute urinary retention Bilateral hydronephrosis H/o BPH Was on Flomax at home.  Acute precipitation of urinary retention likely due to underlying prostate abscess.  Both ureters enlarged because of back pressure.  No evidence of stone. Foley catheter inserted in the ED.  Recent Labs    02/03/22 0829 04/07/22 1121 08/04/22 1433 08/05/22 0507  BUN 18 19 13 16   CREATININE 1.18 1.20 1.32* 1.22   Elevated creatinine Mildly elevated creatinine, due to urinary obstruction.  Creatinine rise not enough to call AKI Improving.  Continue to monitor  Recent Labs    02/03/22 0829 04/07/22 1121 08/04/22 1433 08/05/22 0507  BUN 18 19 13 16   CREATININE 1.18 1.20 1.32* 1.22  CO2 29 30 23 25    HTN PTA meds-lisinopril 20 mg daily. Blood pressure was elevated over 200 at presentation probably because of pain.  Gradually improving. Blood pressure normal this morning.  Plan to resume lisinopril postprocedure.  H/o seizure PTA meds-Keppra 500 mg twice daily   Chronic anemia Slightly low hemoglobin at baseline.  Stable Recent Labs    02/03/22 0829 04/07/22 1121 08/04/22 1433 08/05/22 0507  HGB 11.4* 11.8* 11.5* 11.0*  MCV 85.6 86.2 86.0 85.2   Constipation Bowel regimen  Goals of care   Code Status: Full Code     DVT prophylaxis:  SCDs Start: 08/04/22 2047   Antimicrobials: IV Zosyn Fluid: NS at 50 ml/hr Consultants: urology  Family Communication: Wife at bedside  Status: inpatient Level of care:  Telemetry   Patient from: home Anticipated d/c to: Pending clinical course Needs to continue in-hospital care:  Pending procedure       Diet:  Diet Order             Diet NPO  time specified  Diet effective midnight           Diet Heart Room service appropriate? Yes; Fluid consistency: Thin  Diet effective now                   Scheduled Meds:  tamsulosin  0.4 mg Oral Daily    PRN meds: acetaminophen **OR** acetaminophen, hydrALAZINE, melatonin, naLOXone (NARCAN)  injection, ondansetron (ZOFRAN) IV   Infusions:   sodium chloride     levETIRAcetam 500 mg (08/05/22 0924)   piperacillin-tazobactam (ZOSYN)  IV 3.375 g (08/05/22 0452)    Antimicrobials: Anti-infectives (From admission, onward)    Start     Dose/Rate Route Frequency Ordered Stop   08/04/22 2000  piperacillin-tazobactam (ZOSYN) IVPB 3.375 g        3.375 g 12.5 mL/hr over 240 Minutes Intravenous Every 8 hours 08/04/22 1955         Nutritional status:  Body mass index is 22.82 kg/m.          Objective: Vitals:   08/04/22 2241 08/05/22 0506  BP: (!) 186/72 136/76  Pulse: 67 83  Resp: 18 18  Temp: 98.4 F (36.9 C) 98.3 F (36.8 C)  SpO2: 99%     Intake/Output Summary (Last 24 hours) at 08/05/2022 1302 Last data filed at 08/05/2022 1100 Gross per 24 hour  Intake 1061.06 ml  Output 2150 ml  Net -1088.94 ml   Filed Weights   08/04/22 2241 08/05/22 0500  Weight: 64.5 kg 66.1 kg   Weight change:  Body mass index is 22.82 kg/m.   Physical Exam: General exam: Pleasant, elderly African-American male.  Not in distress Skin: No rashes, lesions or ulcers. HEENT: Atraumatic, normocephalic, no obvious bleeding Lungs: Clear to auscultation bilaterally CVS: Regular rate and rhythm, no murmur GI/Abd soft, tenderness mild in lower abdomen, nondistended, bowel sound present CNS: Alert, awake, oriented x 3 Psychiatry: Mood appropriate Extremities: No pedal edema, no calf tenderness  Data Review: I have personally reviewed the laboratory data and studies available.  F/u labs ordered Unresulted Labs (From admission, onward)     Start     Ordered   08/06/22 0500  CBC  with Differential/Platelet  Daily,   R     Question:  Specimen collection method  Answer:  Lab=Lab collect   08/05/22 1302   08/06/22 0500  Basic metabolic panel  Daily,   R     Question:  Specimen collection method  Answer:  Lab=Lab collect   08/05/22 1302   08/05/22 0539  Urine Culture  Once,   R        08/05/22 0539            Total time spent in review of labs and imaging, patient evaluation, formulation of plan, documentation and communication with family: 55 minutes  Signed, Lorin Glass, MD [facility] 08/05/2022

## 2022-08-06 ENCOUNTER — Inpatient Hospital Stay (HOSPITAL_COMMUNITY): Payer: Medicare Other | Admitting: Certified Registered"

## 2022-08-06 ENCOUNTER — Encounter (HOSPITAL_COMMUNITY): Payer: Self-pay | Admitting: Internal Medicine

## 2022-08-06 ENCOUNTER — Encounter (HOSPITAL_COMMUNITY)
Admission: EM | Disposition: A | Discharge status: Home / Self Care | Payer: Self-pay | Source: Home / Self Care | Attending: Internal Medicine

## 2022-08-06 DIAGNOSIS — N401 Enlarged prostate with lower urinary tract symptoms: Secondary | ICD-10-CM

## 2022-08-06 DIAGNOSIS — I10 Essential (primary) hypertension: Secondary | ICD-10-CM

## 2022-08-06 DIAGNOSIS — R339 Retention of urine, unspecified: Secondary | ICD-10-CM

## 2022-08-06 DIAGNOSIS — Z87891 Personal history of nicotine dependence: Secondary | ICD-10-CM

## 2022-08-06 DIAGNOSIS — N10 Acute tubulo-interstitial nephritis: Secondary | ICD-10-CM | POA: Diagnosis not present

## 2022-08-06 DIAGNOSIS — N412 Abscess of prostate: Secondary | ICD-10-CM

## 2022-08-06 DIAGNOSIS — D649 Anemia, unspecified: Secondary | ICD-10-CM

## 2022-08-06 HISTORY — PX: TRANSURETHRAL RESECTION OF PROSTATE: SHX73

## 2022-08-06 LAB — CBC WITH DIFFERENTIAL/PLATELET
Abs Immature Granulocytes: 0.03 10*3/uL (ref 0.00–0.07)
Basophils Absolute: 0 10*3/uL (ref 0.0–0.1)
Basophils Relative: 0 %
Eosinophils Absolute: 0.1 10*3/uL (ref 0.0–0.5)
Eosinophils Relative: 2 %
HCT: 32.3 % — ABNORMAL LOW (ref 39.0–52.0)
Hemoglobin: 10.5 g/dL — ABNORMAL LOW (ref 13.0–17.0)
Immature Granulocytes: 0 %
Lymphocytes Relative: 15 %
Lymphs Abs: 1.2 10*3/uL (ref 0.7–4.0)
MCH: 28.2 pg (ref 26.0–34.0)
MCHC: 32.5 g/dL (ref 30.0–36.0)
MCV: 86.6 fL (ref 80.0–100.0)
Monocytes Absolute: 1.3 10*3/uL — ABNORMAL HIGH (ref 0.1–1.0)
Monocytes Relative: 17 %
Neutro Abs: 5.1 10*3/uL (ref 1.7–7.7)
Neutrophils Relative %: 66 %
Platelets: 160 10*3/uL (ref 150–400)
RBC: 3.73 MIL/uL — ABNORMAL LOW (ref 4.22–5.81)
RDW: 14.2 % (ref 11.5–15.5)
WBC: 7.7 10*3/uL (ref 4.0–10.5)
nRBC: 0 % (ref 0.0–0.2)

## 2022-08-06 LAB — BASIC METABOLIC PANEL
Anion gap: 7 (ref 5–15)
BUN: 15 mg/dL (ref 8–23)
CO2: 24 mmol/L (ref 22–32)
Calcium: 8.6 mg/dL — ABNORMAL LOW (ref 8.9–10.3)
Chloride: 102 mmol/L (ref 98–111)
Creatinine, Ser: 1.11 mg/dL (ref 0.61–1.24)
GFR, Estimated: >60 mL/min (ref >60)
Glucose, Bld: 92 mg/dL (ref 70–99)
Potassium: 4.1 mmol/L (ref 3.5–5.1)
Sodium: 133 mmol/L — ABNORMAL LOW (ref 135–145)

## 2022-08-06 LAB — URINE CULTURE: Culture: NO GROWTH

## 2022-08-06 LAB — SURGICAL PCR SCREEN
MRSA, PCR: NEGATIVE
Staphylococcus aureus: NEGATIVE

## 2022-08-06 SURGERY — TURP (TRANSURETHRAL RESECTION OF PROSTATE)
Anesthesia: General

## 2022-08-06 MED ORDER — HYDROMORPHONE HCL 1 MG/ML IJ SOLN
INTRAMUSCULAR | Status: AC
  Administered 2022-08-06: 0.25 mg via INTRAVENOUS
  Filled 2022-08-06: qty 1

## 2022-08-06 MED ORDER — SUGAMMADEX SODIUM 200 MG/2ML IV SOLN
INTRAVENOUS | Status: DC | PRN
  Administered 2022-08-06: 150 mg via INTRAVENOUS

## 2022-08-06 MED ORDER — PROPOFOL 10 MG/ML IV BOLUS
INTRAVENOUS | Status: AC
  Filled 2022-08-06: qty 20

## 2022-08-06 MED ORDER — DEXAMETHASONE SODIUM PHOSPHATE 10 MG/ML IJ SOLN
INTRAMUSCULAR | Status: AC
  Filled 2022-08-06: qty 1

## 2022-08-06 MED ORDER — SODIUM CHLORIDE 0.9 % IR SOLN
Status: DC | PRN
  Administered 2022-08-06: 12000 mL via INTRAVESICAL

## 2022-08-06 MED ORDER — SODIUM CHLORIDE 0.9 % IR SOLN
3000.0000 mL | Status: DC
  Administered 2022-08-06 – 2022-08-07 (×2): 3000 mL

## 2022-08-06 MED ORDER — ONDANSETRON HCL 4 MG/2ML IJ SOLN
INTRAMUSCULAR | Status: DC | PRN
  Administered 2022-08-06: 4 mg via INTRAVENOUS

## 2022-08-06 MED ORDER — LACTATED RINGERS IV SOLN: INTRAVENOUS | Status: DC

## 2022-08-06 MED ORDER — LIDOCAINE 2% (20 MG/ML) 5 ML SYRINGE
INTRAMUSCULAR | Status: DC | PRN
  Administered 2022-08-06: 40 mg via INTRAVENOUS

## 2022-08-06 MED ORDER — LIDOCAINE HCL (PF) 2 % IJ SOLN
INTRAMUSCULAR | Status: AC
  Filled 2022-08-06: qty 5

## 2022-08-06 MED ORDER — PROMETHAZINE HCL 25 MG/ML IJ SOLN: 6.2500 mg | INTRAMUSCULAR | Status: DC | PRN

## 2022-08-06 MED ORDER — FENTANYL CITRATE (PF) 100 MCG/2ML IJ SOLN
INTRAMUSCULAR | Status: AC
  Filled 2022-08-06: qty 2

## 2022-08-06 MED ORDER — FENTANYL CITRATE (PF) 100 MCG/2ML IJ SOLN
INTRAMUSCULAR | Status: DC | PRN
  Administered 2022-08-06: 25 ug via INTRAVENOUS
  Administered 2022-08-06 (×2): 50 ug via INTRAVENOUS

## 2022-08-06 MED ORDER — MUPIROCIN 2 % EX OINT
1.0000 | TOPICAL_OINTMENT | Freq: Two times a day (BID) | CUTANEOUS | Status: DC
  Administered 2022-08-06 – 2022-08-07 (×3): 1 via NASAL
  Filled 2022-08-06: qty 22

## 2022-08-06 MED ORDER — MIDAZOLAM HCL 2 MG/2ML IJ SOLN: 0.5000 mg | Freq: Once | INTRAMUSCULAR | Status: DC | PRN

## 2022-08-06 MED ORDER — 0.9 % SODIUM CHLORIDE (POUR BTL) OPTIME
TOPICAL | Status: DC | PRN
  Administered 2022-08-06 (×2): 1000 mL

## 2022-08-06 MED ORDER — HYDROMORPHONE HCL 1 MG/ML IJ SOLN
0.2500 mg | INTRAMUSCULAR | Status: DC | PRN
  Administered 2022-08-06 (×3): 0.25 mg via INTRAVENOUS

## 2022-08-06 MED ORDER — CHLORHEXIDINE GLUCONATE CLOTH 2 % EX PADS
6.0000 | MEDICATED_PAD | Freq: Every day | CUTANEOUS | Status: DC
  Administered 2022-08-06 – 2022-08-07 (×2): 6 via TOPICAL

## 2022-08-06 MED ORDER — PROPOFOL 10 MG/ML IV BOLUS
INTRAVENOUS | Status: DC | PRN
  Administered 2022-08-06: 110 mg via INTRAVENOUS

## 2022-08-06 MED ORDER — OXYCODONE HCL 5 MG/5ML PO SOLN: 5.0000 mg | Freq: Once | ORAL | Status: DC | PRN

## 2022-08-06 MED ORDER — OXYCODONE HCL 5 MG PO TABS: 5.0000 mg | ORAL_TABLET | Freq: Once | ORAL | Status: DC | PRN

## 2022-08-06 MED ORDER — ROCURONIUM BROMIDE 10 MG/ML (PF) SYRINGE
PREFILLED_SYRINGE | INTRAVENOUS | Status: DC | PRN
  Administered 2022-08-06: 60 mg via INTRAVENOUS

## 2022-08-06 MED ORDER — ONDANSETRON HCL 4 MG/2ML IJ SOLN
INTRAMUSCULAR | Status: AC
  Filled 2022-08-06: qty 2

## 2022-08-06 SURGICAL SUPPLY — 19 items
BAG DRN RND TRDRP ANRFLXCHMBR (UROLOGICAL SUPPLIES) ×1
BAG URINE DRAIN 2000ML AR STRL (UROLOGICAL SUPPLIES) IMPLANT
BAG URO CATCHER STRL LF (MISCELLANEOUS) ×2 IMPLANT
CATH FOLEY 3WAY 30CC 22FR (CATHETERS) IMPLANT
DRAPE FOOT SWITCH (DRAPES) ×2 IMPLANT
ELECT REM PT RETURN 15FT ADLT (MISCELLANEOUS) ×2 IMPLANT
GLOVE SURG SS PI 8.0 STRL IVOR (GLOVE) IMPLANT
GOWN STRL REUS W/ TWL XL LVL3 (GOWN DISPOSABLE) ×2 IMPLANT
GOWN STRL REUS W/TWL XL LVL3 (GOWN DISPOSABLE) ×1
HOLDER FOLEY CATH W/STRAP (MISCELLANEOUS) IMPLANT
KIT TURNOVER KIT A (KITS) IMPLANT
LOOP CUT BIPOLAR 24F LRG (ELECTROSURGICAL) IMPLANT
MANIFOLD NEPTUNE II (INSTRUMENTS) ×2 IMPLANT
PACK CYSTO (CUSTOM PROCEDURE TRAY) ×2 IMPLANT
SYR 30ML LL (SYRINGE) IMPLANT
SYR TOOMEY IRRIG 70ML (MISCELLANEOUS) ×1
SYRINGE TOOMEY IRRIG 70ML (MISCELLANEOUS) IMPLANT
TUBING CONNECTING 10 (TUBING) ×2 IMPLANT
TUBING UROLOGY SET (TUBING) ×2 IMPLANT

## 2022-08-06 NOTE — Transfer of Care (Signed)
Immediate Anesthesia Transfer of Care Note  Patient: Evan Escobar  Procedure(s) Performed: TRANSURETHRAL RESECTION OF THE PROSTATE (TURP) UNROOFING PROSTATIC ABSCESS  Patient Location: PACU  Anesthesia Type:General  Level of Consciousness: awake  Airway & Oxygen Therapy: Patient Spontanous Breathing and Patient connected to face mask oxygen  Post-op Assessment: Report given to RN and Post -op Vital signs reviewed and stable  Post vital signs: Reviewed and stable  Last Vitals:  Vitals Value Taken Time  BP 170/68 08/06/22 1741  Temp    Pulse 65 08/06/22 1743  Resp 19 08/06/22 1743  SpO2 100 % 08/06/22 1743  Vitals shown include unvalidated device data.  Last Pain:  Vitals:   08/06/22 1457  TempSrc: Oral  PainSc: 0-No pain         Complications: No notable events documented.

## 2022-08-06 NOTE — Anesthesia Postprocedure Evaluation (Signed)
Anesthesia Post Note  Patient: Evan Escobar  Procedure(s) Performed: TRANSURETHRAL RESECTION OF THE PROSTATE (TURP) UNROOFING PROSTATIC ABSCESS     Patient location during evaluation: PACU Anesthesia Type: General Level of consciousness: patient cooperative, oriented and sedated Pain management: pain level controlled Vital Signs Assessment: post-procedure vital signs reviewed and stable Respiratory status: spontaneous breathing, nonlabored ventilation and respiratory function stable Cardiovascular status: blood pressure returned to baseline and stable Postop Assessment: no apparent nausea or vomiting Anesthetic complications: no   No notable events documented.  Last Vitals:  Vitals:   08/06/22 1830 08/06/22 1845  BP: (!) 181/74 (!) 174/75  Pulse: 69 65  Resp: 18 18  Temp:  36.7 C  SpO2: 98% 98%    Last Pain:  Vitals:   08/06/22 1845  TempSrc:   PainSc: 3                  Viera Okonski,E. Christiano Blandon

## 2022-08-06 NOTE — Progress Notes (Signed)
PROGRESS NOTE  Evan Escobar  DOB: [birth year]  PCP: Myrlene Broker, MD ZOX:[id number]  DOA: 08/04/2022  LOS: 2 days  Hospital Day: 3  Brief narrative: Evan Escobar is a 82 y.o. male with PMH significant for BPH, recurrent UTI, HTN, seizure, chronic anemia. 6/17, patient presented to the ED with complaint of inability to void and progressive abdominal pain. Symptoms started a week ago and progressively worsened. 6/13, urine culture was done which resulted on 6/15 showing more than 10 thousand CFU per mL of Klebsiella pneumoniae.  Started on a course of Bactrim.  Symptoms worsened and hence presented to the ED  In the ED, afebrile, heart rate in 70s, blood pressure elevated as high as 204/77 Bladder scan showed urinary retention.  Foley catheter was inserted draining more than 600 mL of urine. Labs showed WBC count normal at 8, sodium low at 129, BUN/creatinine 13/1.32 Urinalysis showed clear yellow urine with large amount of hemoglobin, no leukocytes, nitrate or bacteria noted CT abdomen/pelvis with contrast showed evidence of bilateral pyelonephritis, distended bladder with mild bilateral hydroureteronephrosis concerning for bladder outlet obstruction secondary to prostate abscess.  EDP discussed the case with on-call urology, Dr. Annabell Howells. Recommended IV antibiotics, n.p.o. with possible plan of intervention in the morning. Patient was started on IV Zosyn Admitted to [facility]  Subjective: Patient was seen and examined this morning.   Lying on bed.  Not in distress.  Pending OR today.  Family not at bedside.   Uneventful last 24 hours.  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  Urology plans to operate this afternoon.  Patient remains n.p.o. after midnight On IV antibiotics Currently pain controlled with as needed Tylenol.  Recent Labs  Lab 08/04/22 1433 08/05/22 0507 08/06/22 0507  WBC 8.0 6.5 7.7    Acute bilateral  pyelonephritis Recent Klebsiella UTI CT abdomen noted pyelonephritis as well On IV antibiotics at this time  Acute urinary retention Bilateral hydronephrosis H/o BPH Was on Flomax at home.  Acute precipitation of urinary retention likely due to underlying prostate abscess.  Both ureters were enlarged because of back pressure.  No evidence of stone. Foley catheter inserted in the ED. Has clear urine in Foley. Recent Labs    02/03/22 0829 04/07/22 1121 08/04/22 1433 08/05/22 0507 08/06/22 0507  BUN 18 19 13 16 15   CREATININE 1.18 1.20 1.32* 1.22 1.11    Elevated creatinine Mildly elevated creatinine, due to urinary obstruction.  Creatinine rise not enough to call AKI Improving.  Continue to monitor  Recent Labs    02/03/22 0829 04/07/22 1121 08/04/22 1433 08/05/22 0507 08/06/22 0507  BUN 18 19 13 16 15   CREATININE 1.18 1.20 1.32* 1.22 1.11  CO2 29 30 23 25 24     HTN PTA meds-lisinopril 20 mg daily. Blood pressure was elevated over 200 at presentation probably because of pain.  Gradually improving. Blood pressure normal this morning.  Plan to resume lisinopril postprocedure.  H/o seizure PTA meds-Keppra 500 mg twice daily   Chronic anemia Slightly low hemoglobin at baseline.  Stable Recent Labs    02/03/22 0829 04/07/22 1121 08/04/22 1433 08/05/22 0507 08/06/22 0507  HGB 11.4* 11.8* 11.5* 11.0* 10.5*  MCV 85.6 86.2 86.0 85.2 86.6    Constipation Bowel regimen to continue  Goals of care   Code Status: Full Code     DVT prophylaxis:  SCDs Start: 08/04/22 2047   Antimicrobials: IV Zosyn Fluid: NS at 50 ml/hr to continue  Consultants: urology Family Communication: Wife not at bedside today  Status: inpatient Level of care:  Telemetry   Patient from: home Anticipated d/c to: Pending clinical course Needs to continue in-hospital care:  Pending procedure       Diet:  Diet Order             Diet NPO time specified  Diet effective midnight                    Scheduled Meds:  Chlorhexidine Gluconate Cloth  6 each Topical Daily   tamsulosin  0.4 mg Oral Daily    PRN meds: acetaminophen **OR** acetaminophen, hydrALAZINE, melatonin, naLOXone (NARCAN)  injection, ondansetron (ZOFRAN) IV   Infusions:   sodium chloride 50 mL/hr at 08/05/22 1839   levETIRAcetam 500 mg (08/05/22 2158)   piperacillin-tazobactam (ZOSYN)  IV 3.375 g (08/06/22 0521)    Antimicrobials: Anti-infectives (From admission, onward)    Start     Dose/Rate Route Frequency Ordered Stop   08/04/22 2000  piperacillin-tazobactam (ZOSYN) IVPB 3.375 g        3.375 g 12.5 mL/hr over 240 Minutes Intravenous Every 8 hours 08/04/22 1955         Nutritional status:  Body mass index is 21.48 kg/m.          Objective: Vitals:   08/05/22 2139 08/06/22 0558  BP: (!) 145/72 (!) 140/74  Pulse: 73 69  Resp: 16 16  Temp: 98.3 F (36.8 C) 98.4 F (36.9 C)  SpO2: 100% 100%    Intake/Output Summary (Last 24 hours) at 08/06/2022 0852 Last data filed at 08/06/2022 0600 Gross per 24 hour  Intake 1702.88 ml  Output 1125 ml  Net 577.88 ml    Filed Weights   08/04/22 2241 08/05/22 0500 08/06/22 0500  Weight: 64.5 kg 66.1 kg 62.2 kg   Weight change: -2.3 kg Body mass index is 21.48 kg/m.   Physical Exam: General exam: Pleasant, elderly African-American male.  Not in distress Skin: No rashes, lesions or ulcers. HEENT: Atraumatic, normocephalic, no obvious bleeding Lungs: Clear to auscultation bilaterally CVS: Regular rate and rhythm, no murmur GI/Abd soft, tenderness mild in lower abdomen, nondistended, bowel sound present CNS: Alert, awake, oriented x 3 Psychiatry: Mood appropriate Extremities: No pedal edema, no calf tenderness  Data Review: I have personally reviewed the laboratory data and studies available.  F/u labs ordered Unresulted Labs (From admission, onward)     Start     Ordered   08/07/22 0600  Type and screen [facility]  Once,   STAT       Comments: [facility]    08/06/22 0347   08/06/22 0500  CBC with Differential/Platelet  Daily,   R     Question:  Specimen collection method  Answer:  Lab=Lab collect   08/05/22 1302   08/06/22 0500  Basic metabolic panel  Daily,   R     Question:  Specimen collection method  Answer:  Lab=Lab collect   08/05/22 1302   08/05/22 0539  Urine Culture  Once,   R        08/05/22 0539            Total time spent in review of labs and imaging, patient evaluation, formulation of plan, documentation and communication with family: 45 minutes  Signed, Lorin Glass, MD [facility] 08/06/2022

## 2022-08-06 NOTE — Anesthesia Preprocedure Evaluation (Addendum)
Anesthesia Evaluation  Patient identified by MRN, date of birth, ID band Patient awake    Reviewed: Allergy & Precautions, NPO status , Patient's Chart, lab work & pertinent test results  History of Anesthesia Complications Negative for: history of anesthetic complications  Airway Mallampati: I  TM Distance: >3 FB Neck ROM: Full    Dental  (+) Edentulous Upper, Poor Dentition, Missing, Dental Advisory Given   Pulmonary former smoker   breath sounds clear to auscultation       Cardiovascular hypertension, Pt. on medications (-) angina  Rhythm:Regular Rate:Normal     Neuro/Psych Seizures -, Well Controlled,  glaucoma    GI/Hepatic negative GI ROS, Neg liver ROS,,,  Endo/Other  negative endocrine ROS    Renal/GU negative Renal ROS     Musculoskeletal   Abdominal   Peds  Hematology  (+) Blood dyscrasia, anemia Hb 10.5, plt 160k   Anesthesia Other Findings   Reproductive/Obstetrics                             Anesthesia Physical Anesthesia Plan  ASA: 3  Anesthesia Plan: General   Post-op Pain Management: Tylenol PO (pre-op)*   Induction: Intravenous  PONV Risk Score and Plan: 2 and Ondansetron, Dexamethasone and Treatment may vary due to age or medical condition  Airway Management Planned: Oral ETT  Additional Equipment: None  Intra-op Plan:   Post-operative Plan: Extubation in OR  Informed Consent: I have reviewed the patients History and Physical, chart, labs and discussed the procedure including the risks, benefits and alternatives for the proposed anesthesia with the patient or authorized representative who has indicated his/her understanding and acceptance.     Dental advisory given  Plan Discussed with: CRNA and Surgeon  Anesthesia Plan Comments:         Anesthesia Quick Evaluation

## 2022-08-06 NOTE — Anesthesia Procedure Notes (Signed)
Procedure Name: Intubation Date/Time: 08/06/2022 4:49 PM  Performed by: Florene Route, CRNAPre-anesthesia Checklist: Patient identified, Emergency Drugs available, Suction available and Patient being monitored Patient Re-evaluated:Patient Re-evaluated prior to induction Oxygen Delivery Method: Circle system utilized Preoxygenation: Pre-oxygenation with 100% oxygen Induction Type: IV induction Ventilation: Mask ventilation without difficulty and Oral airway inserted - appropriate to patient size Laryngoscope Size: Miller and 3 Grade View: Grade I Tube type: Oral Tube size: 7.5 mm Number of attempts: 1 Airway Equipment and Method: Stylet Placement Confirmation: ETT inserted through vocal cords under direct vision, positive ETCO2 and breath sounds checked- equal and bilateral Secured at: 23 cm Tube secured with: Tape Dental Injury: Teeth and Oropharynx as per pre-operative assessment

## 2022-08-06 NOTE — Discharge Instructions (Signed)
I will arrange follow up for catheter removal in my office for next week.

## 2022-08-06 NOTE — Progress Notes (Signed)
Day of Surgery  Subjective: Evan Escobar is doing well.  He has some catheter irritation but is otherwise without complaints.  ROS:  Review of Systems  All other systems reviewed and are negative.   Anti-infectives: Anti-infectives (From admission, onward)    Start     Dose/Rate Route Frequency Ordered Stop   08/04/22 2000  piperacillin-tazobactam (ZOSYN) IVPB 3.375 g        3.375 g 12.5 mL/hr over 240 Minutes Intravenous Every 8 hours 08/04/22 1955         Current Facility-Administered Medications  Medication Dose Route Frequency Provider Last Rate Last Admin   0.9 %  sodium chloride infusion   Intravenous Continuous Dahal, Melina Schools, MD 50 mL/hr at 08/05/22 1839 Infusion Verify at 08/05/22 1839   acetaminophen (TYLENOL) tablet 650 mg  650 mg Oral Q6H PRN Howerter, Justin B, DO       Or   acetaminophen (TYLENOL) suppository 650 mg  650 mg Rectal Q6H PRN Howerter, Justin B, DO       Chlorhexidine Gluconate Cloth 2 % PADS 6 each  6 each Topical Daily Dahal, Binaya, MD       hydrALAZINE (APRESOLINE) injection 10 mg  10 mg Intravenous Q4H PRN Howerter, Justin B, DO       levETIRAcetam (KEPPRA) IVPB 500 mg/100 mL premix  500 mg Intravenous Q12H Howerter, Justin B, DO 400 mL/hr at 08/05/22 2158 500 mg at 08/05/22 2158   melatonin tablet 3 mg  3 mg Oral QHS PRN Howerter, Justin B, DO       naloxone (NARCAN) injection 0.4 mg  0.4 mg Intravenous PRN Howerter, Justin B, DO       ondansetron (ZOFRAN) injection 4 mg  4 mg Intravenous Q6H PRN Howerter, Justin B, DO       piperacillin-tazobactam (ZOSYN) IVPB 3.375 g  3.375 g Intravenous Q8H Woodward, Teply, RPH 12.5 mL/hr at 08/06/22 0521 3.375 g at 08/06/22 0521   tamsulosin (FLOMAX) capsule 0.4 mg  0.4 mg Oral Daily Howerter, Justin B, DO   0.4 mg at 08/05/22 1126     Objective: Vital signs in last 24 hours: Temp:  [97.8 F (36.6 C)-98.4 F (36.9 C)] 98.4 F (36.9 C) (06/19 0558) Pulse Rate:  [69-76] 69 (06/19 0558) Resp:  [16-17] 16 (06/19  0558) BP: (140-152)/(68-74) 140/74 (06/19 0558) SpO2:  [100 %] 100 % (06/19 0558) Weight:  [62.2 kg] 62.2 kg (06/19 0500)  Intake/Output from previous day: 06/18 0701 - 06/19 0700 In: 1702.9 [P.O.:720; I.V.:641.7; IV Piggyback:341.2] Out: 1125 [Urine:1125] Intake/Output this shift: No intake/output data recorded.   Physical Exam Vitals reviewed.  Constitutional:      Appearance: Normal appearance.  Genitourinary:    Comments: Urine is clear.  Neurological:     Mental Status: He is alert.     Lab Results:  Recent Labs    08/05/22 0507 08/06/22 0507  WBC 6.5 7.7  HGB 11.0* 10.5*  HCT 33.5* 32.3*  PLT 165 160   BMET Recent Labs    08/05/22 0507 08/06/22 0507  NA 136 133*  K 4.3 4.1  CL 104 102  CO2 25 24  GLUCOSE 87 92  BUN 16 15  CREATININE 1.22 1.11  CALCIUM 9.0 8.6*   PT/INR Recent Labs    08/05/22 0507  LABPROT 15.2  INR 1.2   ABG No results for input(s): "PHART", "HCO3" in the last 72 hours.  Invalid input(s): "PCO2", "PO2"  Studies/Results: CT ABDOMEN PELVIS W CONTRAST  Result Date: 08/04/2022  CLINICAL DATA:  Urinary retention EXAM: CT ABDOMEN AND PELVIS WITH CONTRAST TECHNIQUE: Multidetector CT imaging of the abdomen and pelvis was performed using the standard protocol following bolus administration of intravenous contrast. RADIATION DOSE REDUCTION: This exam was performed according to the departmental dose-optimization program which includes automated exposure control, adjustment of the mA and/or kV according to patient size and/or use of iterative reconstruction technique. CONTRAST:  75mL OMNIPAQUE IOHEXOL 350 MG/ML SOLN COMPARISON:  Abdomen radiographs 10/13/2019 FINDINGS: Lower chest: No acute abnormality. Hepatobiliary: Scattered tiny hypodensities are too small to definitively characterize but likely represent cysts. Irregular hypoattenuation along the falciform ligament likely due to fatty infiltration. Unremarkable gallbladder and biliary  tree. Pancreas: Unremarkable. Spleen: Unremarkable. Adrenals/Urinary Tract: Normal adrenal glands. Symmetric nonspecific perinephric stranding. Mild bilateral hydroureteronephrosis. Geographic areas of cortical hypoattenuation/hypoenhancement on delayed images compatible with pyelonephritis. Distended bladder. Stomach/Bowel: Normal caliber large and small bowel. No bowel wall thickening. Stomach is within normal limits. Small hiatal hernia. Vascular/Lymphatic: Aortic atherosclerosis. No enlarged abdominal or pelvic lymph nodes. Reproductive: Enlarged prostate. Within the superior prostate there is a peripherally enhancing fluid collection measuring 3.0 x 2.6 x 3.2 cm. Moderate bilateral hydroceles. Other: No free intraperitoneal fluid or air. Musculoskeletal: Thoracolumbar spondylosis. IMPRESSION: 1. Bilateral pyelonephritis. 2. Peripherally enhancing fluid collection in the superior prostate measuring up to 3.2 cm concerning for abscess. 3. Distended bladder with mild bilateral hydroureteronephrosis. Question bladder outlet obstruction secondary to prostate abscess. 4. Moderate bilateral hydroceles. Aortic Atherosclerosis (ICD10-I70.0). Electronically Signed   By: Minerva Fester M.D.   On: 08/04/2022 19:32     Assessment and Plan: Prostatic abscess with retention.   We will proceed with TURP unroofing of the prostatic abscess this evening.       LOS: 2 days    Evan Escobar 6/19/2024Patient ID: Evan Escobar, male   DOB: 04/09/1940, 82 y.o.   MRN: [id number]

## 2022-08-06 NOTE — Op Note (Signed)
Procedure: Cystoscopy with transurethral resection of the prostate and unroofing of prostatic abscess.  Preoperative diagnosis: BPH with prostatic abscess with urinary retention.  Postoperative diagnosis same.  Surgeon: Dr. [name].  Anesthesia: General.  Specimen: Prostate tissue.  Drain: 22 [place] three-way Foley catheter.  EBL: 50 mL.  Complications: None.  Indications: The patient is an 82 year old male who presented to the hospital and urinary retention after being treated for multiple urinary tract infections over the last few months.  A CT was done which demonstrated a large prostate with a 3 cm abscess, distended bladder and bilateral hydronephrosis.  It was felt that transurethral resection and roofing of the abscess was indicated.  Procedure: He had been on Zosyn and was taken the operating room where general anesthetic was induced.  He was placed in lithotomy position and fitted with PAS hose.  His perineum and genitalia were prepped with Betadine solution he was draped in usual sterile fashion.  Cystoscopy was performed using 21 [place] scope and 30 degree lens.  Examination: Normal urethra.  The external sphincter was intact.  The prostatic urethra had trilobar hyperplasia with a prominent middle lobe and urethral length approximately 4 cm.  The bladder had considerable erythema from decompression and Foley irritation with some edema but no tumors or stones were noted.  Ureteral orifices were unremarkable.  The cystoscope was removed and replaced with a 26 French continuous-flow resectoscope with the aid of a visual obturator.  The visual obturator was replaced with an Wandra Scot handle with a bipolar loop and the 30 degree lens.  Saline was used the irrigant.  The middle lobe was resected from 5-7 o'clock down to the bladder neck fibers and during the middle lobe resection I encountered the abscess which expressed a large volume of creamy purulent material.  I then continued the  resection of the floor of the prostate out to alongside the verumontanum bilaterally.  This was followed by resection of the right and left lateral lobes sufficient to create an adequate channel.  I then cleaned up the abscess cavity to remove debris and complete hemostasis.  The bladder was then evacuated free of all chips and final hemostasis was achieved.  Final inspection demonstrated intact ureteral orifices, a widely patent prostatic channel without active bleeding and an intact external sphincter.  The scope was removed and pressure on the bladder produced a good stream.  A 22 French three-way Foley catheter was then placed with the aid of a catheter guide.  The balloon was filled with 30 mL of sterile fluid and the catheter hand irrigated with clear return and then was placed to straight drainage and continuous irrigation.  He was taken down for lithotomy position, his anesthetic was reversed and he was moved to recovery room in stable condition.  There were no complications.

## 2022-08-07 ENCOUNTER — Encounter (HOSPITAL_COMMUNITY): Payer: Self-pay | Admitting: Urology

## 2022-08-07 LAB — CBC WITH DIFFERENTIAL/PLATELET
Abs Immature Granulocytes: 0.03 10*3/uL (ref 0.00–0.07)
Basophils Absolute: 0 10*3/uL (ref 0.0–0.1)
Basophils Relative: 0 %
Eosinophils Absolute: 0.2 10*3/uL (ref 0.0–0.5)
Eosinophils Relative: 2 %
HCT: 32.6 % — ABNORMAL LOW (ref 39.0–52.0)
Hemoglobin: 10.3 g/dL — ABNORMAL LOW (ref 13.0–17.0)
Immature Granulocytes: 0 %
Lymphocytes Relative: 13 %
Lymphs Abs: 1 10*3/uL (ref 0.7–4.0)
MCH: 28 pg (ref 26.0–34.0)
MCHC: 31.6 g/dL (ref 30.0–36.0)
MCV: 88.6 fL (ref 80.0–100.0)
Monocytes Absolute: 1.1 10*3/uL — ABNORMAL HIGH (ref 0.1–1.0)
Monocytes Relative: 15 %
Neutro Abs: 4.9 10*3/uL (ref 1.7–7.7)
Neutrophils Relative %: 70 %
Platelets: 152 10*3/uL (ref 150–400)
RBC: 3.68 MIL/uL — ABNORMAL LOW (ref 4.22–5.81)
RDW: 14.1 % (ref 11.5–15.5)
WBC: 7.2 10*3/uL (ref 4.0–10.5)
nRBC: 0 % (ref 0.0–0.2)

## 2022-08-07 LAB — BASIC METABOLIC PANEL
Anion gap: 8 (ref 5–15)
BUN: 12 mg/dL (ref 8–23)
CO2: 24 mmol/L (ref 22–32)
Calcium: 8.6 mg/dL — ABNORMAL LOW (ref 8.9–10.3)
Chloride: 103 mmol/L (ref 98–111)
Creatinine, Ser: 1.14 mg/dL (ref 0.61–1.24)
GFR, Estimated: >60 mL/min (ref >60)
Glucose, Bld: 82 mg/dL (ref 70–99)
Potassium: 3.9 mmol/L (ref 3.5–5.1)
Sodium: 135 mmol/L (ref 135–145)

## 2022-08-07 LAB — TYPE AND SCREEN
ABO/RH(D): A POS
Antibody Screen: NEGATIVE

## 2022-08-07 MED ORDER — AMOXICILLIN-POT CLAVULANATE 875-125 MG PO TABS: 1.0000 | ORAL_TABLET | Freq: Two times a day (BID) | ORAL | 0 refills | Status: AC

## 2022-08-07 MED ORDER — SACCHAROMYCES BOULARDII 250 MG PO CAPS: 250.0000 mg | ORAL_CAPSULE | Freq: Two times a day (BID) | ORAL | 0 refills | Status: AC

## 2022-08-07 MED ORDER — PHENOL 1.4 % MT LIQD: 1.0000 | OROMUCOSAL | Status: DC | PRN

## 2022-08-07 NOTE — Discharge Summary (Signed)
Physician Discharge Summary  Evan Escobar RUE:[id number] DOB: [birth year] DOA: 08/04/2022  PCP: Myrlene Broker, MD  Admit date: 08/04/2022 Discharge date: 08/07/2022  Admitted From: Home Discharge disposition: Home  Recommendations at discharge:  Continue Augmentin for next 7 days with probiotics to complete course. Follow-up with Dr. Annabell Howells as outpatient for voiding trial.    Brief narrative: Evan Escobar is a 82 y.o. male with PMH significant for BPH, recurrent UTI, HTN, seizure, chronic anemia. 6/17, patient presented to the ED with complaint of inability to void and progressive abdominal pain. Symptoms started a week ago and progressively worsened. 6/13, urine culture was done which resulted on 6/15 showing more than 10 thousand CFU per mL of Klebsiella pneumoniae.  Started on a course of Bactrim.  Symptoms worsened and hence presented to the ED  In the ED, afebrile, heart rate in 70s, blood pressure elevated as high as 204/77 Bladder scan showed urinary retention.  Foley catheter was inserted draining more than 600 mL of urine. Labs showed WBC count normal at 8, sodium low at 129, BUN/creatinine 13/1.32 Urinalysis showed clear yellow urine with large amount of hemoglobin, no leukocytes, nitrate or bacteria noted CT abdomen/pelvis with contrast showed evidence of bilateral pyelonephritis, distended bladder with mild bilateral hydroureteronephrosis concerning for bladder outlet obstruction secondary to prostate abscess.  EDP discussed the case with on-call urology, Dr. Annabell Howells. Recommended IV antibiotics, n.p.o. with possible plan of intervention in the morning. Patient was started on IV Zosyn Admitted to [facility]  Subjective: Patient was seen and examined this morning.   Lying on bed.  Not in distress.  Feels well.  Appetite improved. Feels ready for discharge today.  Chart reviewed: Prostate abscess 3.2 cm in size prostate abscess noted in CT pelvis.   Urology was  consulted.   6/19, underwent TURP and unroofing of prostatic abscess.   Initially treated with IV Zosyn. Initial urine culture has not shown any growth.  Discharged on 7 more days of oral Augmentin with probiotics. Currently pain controlled with as needed Tylenol.  Recent Labs  Lab 08/04/22 1433 08/05/22 0507 08/06/22 0507 08/07/22 0450  WBC 8.0 6.5 7.7 7.2   Acute bilateral pyelonephritis Recent Klebsiella UTI CT abdomen noted pyelonephritis as well Currently on IV Zosyn.  7 more days of oral Augmentin as stated above  Acute urinary retention Bilateral hydronephrosis H/o BPH Acute precipitation of urinary retention likely due to underlying prostate abscess.  Both ureters were enlarged because of back pressure.  No evidence of stone. Foley catheter was inserted in the ED.  Per urology, continue Foley at discharge.  Follow-up with Dr. Cyndie Chime next week for outpatient voiding trial. Continue Flomax as before. Recent Labs    02/03/22 0829 04/07/22 1121 08/04/22 1433 08/05/22 0507 08/06/22 0507 08/07/22 0450  BUN 18 19 13 16 15 12   CREATININE 1.18 1.20 1.32* 1.22 1.11 1.14   Elevated creatinine Mildly elevated creatinine, due to urinary obstruction.  Creatinine rise not enough to call AKI Improving.  Continue to monitor  Recent Labs    02/03/22 0829 04/07/22 1121 08/04/22 1433 08/05/22 0507 08/06/22 0507 08/07/22 0450  BUN 18 19 13 16 15 12   CREATININE 1.18 1.20 1.32* 1.22 1.11 1.14  CO2 29 30 23 25 24 24    HTN PTA meds-lisinopril 20 mg daily. Blood pressure was elevated over 200 at presentation probably because of pain.  Gradually improved.  Continue lisinopril at home.  H/o seizure PTA meds-Keppra 500 mg twice daily   Chronic anemia Slightly  low hemoglobin at baseline.  Stable Recent Labs    04/07/22 1121 08/04/22 1433 08/05/22 0507 08/06/22 0507 08/07/22 0450  HGB 11.8* 11.5* 11.0* 10.5* 10.3*  MCV 86.2 86.0 85.2 86.6 88.6   Constipation Bowel  regimen to continue  Goals of care   Code Status: Full Code   Wounds:  -    Discharge Exam:   Vitals:   08/06/22 2102 08/07/22 0408 08/07/22 0500 08/07/22 1233  BP: (!) 137/58 (!) 159/73  (!) 157/79  Pulse: 72 72  67  Resp: 18 17  16   Temp: 98.4 F (36.9 C) 98.2 F (36.8 C)  97.6 F (36.4 C)  TempSrc: Oral Oral  Oral  SpO2: 100% 98%  100%  Weight:   63.8 kg   Height:        Body mass index is 22.03 kg/m.  General exam: Pleasant, elderly African-American male.  Not in distress.  Foley catheter with clear urine Skin: No rashes, lesions or ulcers. HEENT: Atraumatic, normocephalic, no obvious bleeding Lungs: Clear to auscultation bilaterally CVS: Regular rate and rhythm, no murmur GI/Abd soft, tenderness mild in lower abdomen, nondistended, bowel sound present CNS: Alert, awake, oriented x 3 Psychiatry: Mood appropriate Extremities: No pedal edema, no calf tenderness  Follow ups:    Follow-up Information     [facility] Follow up.   Why: The office will contact you to arrange follow up for next week. Contact information: [address] [place] [address] [phone]        Myrlene Broker, MD Follow up.   Specialty: Internal Medicine Contact information: [address] [place] [address] [phone]                 Discharge Instructions:   Discharge Instructions     Call MD for:  difficulty breathing, headache or visual disturbances   Complete by: As directed    Call MD for:  extreme fatigue   Complete by: As directed    Call MD for:  hives   Complete by: As directed    Call MD for:  persistant dizziness or light-headedness   Complete by: As directed    Call MD for:  persistant nausea and vomiting   Complete by: As directed    Call MD for:  severe uncontrolled pain   Complete by: As directed    Call MD for:  temperature >100.4   Complete by: As directed    Diet general   Complete by:  As directed    Discharge instructions   Complete by: As directed    Recommendations at discharge:   Continue Augmentin for next 7 days with probiotics to complete course.  Follow-up with Dr. Annabell Howells as outpatient for voiding trial.  General discharge instructions: Follow with Primary MD Myrlene Broker, MD in 7 days  Please request your PCP  to go over your hospital tests, procedures, radiology results at the follow up. Please get your medicines reviewed and adjusted.  Your PCP may decide to repeat certain labs or tests as needed. Do not drive, operate heavy machinery, perform activities at heights, swimming or participation in water activities or provide baby sitting services if your were admitted for syncope or siezures until you have seen by Primary MD or a Neurologist and advised to do so again. [place] Controlled Substance Reporting System database was reviewed. Do not drive, operate heavy machinery, perform activities at heights, swim, participate in water activities or provide  baby-sitting services while on medications for pain, sleep and mood until your outpatient physician has reevaluated you and advised to do so again.  You are strongly recommended to comply with the dose, frequency and duration of prescribed medications. Activity: As tolerated with Full fall precautions use walker/cane & assistance as needed Avoid using any recreational substances like cigarette, tobacco, alcohol, or non-prescribed drug. If you experience worsening of your admission symptoms, develop shortness of breath, life threatening emergency, suicidal or homicidal thoughts you must seek medical attention immediately by calling 911 or calling your MD immediately  if symptoms less severe. You must read complete instructions/literature along with all the possible adverse reactions/side effects for all the medicines you take and that have been prescribed to you. Take any new medicine only after you have  completely understood and accepted all the possible adverse reactions/side effects.  Wear Seat belts while driving. You were cared for by a hospitalist during your hospital stay. If you have any questions about your discharge medications or the care you received while you were in the hospital after you are discharged, you can call the unit and ask to speak with the hospitalist or the covering physician. Once you are discharged, your primary care physician will handle any further medical issues. Please note that NO REFILLS for any discharge medications will be authorized once you are discharged, as it is imperative that you return to your primary care physician (or establish a relationship with a primary care physician if you do not have one).   Increase activity slowly   Complete by: As directed        Discharge Medications:   Allergies as of 08/07/2022   No Known Allergies      Medication List     STOP taking these medications    sulfamethoxazole-trimethoprim 800-160 MG tablet Commonly known as: BACTRIM DS       TAKE these medications    acetaminophen 500 MG tablet Commonly known as: TYLENOL Take 500 mg by mouth every 6 (six) hours as needed for moderate pain or headache.   amoxicillin-clavulanate 875-125 MG tablet Commonly known as: AUGMENTIN Take 1 tablet by mouth 2 (two) times daily for 7 days.   aspirin 81 MG tablet Take 81 mg by mouth daily.   brimonidine 0.1 % Soln Commonly known as: ALPHAGAN P Place 1 drop into both eyes 2 (two) times daily.   cycloSPORINE 0.05 % ophthalmic emulsion Commonly known as: RESTASIS Place 1 drop into both eyes 2 (two) times daily.   latanoprost 0.005 % ophthalmic solution Commonly known as: XALATAN Place 1 drop into both eyes at bedtime.   levETIRAcetam 500 MG tablet Commonly known as: KEPPRA TAKE 1 TABLET(500 MG) BY MOUTH TWICE DAILY What changed: See the new instructions.   lisinopril 20 MG tablet Commonly known as:  ZESTRIL TAKE 1 TABLET(20 MG) BY MOUTH DAILY What changed: See the new instructions.   lubiprostone 24 MCG capsule Commonly known as: AMITIZA Take 1 capsule (24 mcg total) by mouth 2 (two) times daily with a meal.   magnesium hydroxide 400 MG/5ML suspension Commonly known as: MILK OF MAGNESIA Take 30 mLs by mouth daily as needed for mild constipation.   multivitamin tablet Take 1 tablet by mouth daily.   polyethylene glycol powder 17 GM/SCOOP powder Commonly known as: GLYCOLAX/MIRALAX Take 17 g by mouth 2 (two) times daily as needed. What changed: reasons to take this   saccharomyces boulardii 250 MG capsule Commonly known as: FLORASTOR Take 1 capsule (  250 mg total) by mouth 2 (two) times daily for 7 days.   tamsulosin 0.4 MG Caps capsule Commonly known as: FLOMAX Take 1 capsule (0.4 mg total) by mouth in the morning and at bedtime. What changed: when to take this         The results of significant diagnostics from this hospitalization (including imaging, microbiology, ancillary and laboratory) are listed below for reference.    Procedures and Diagnostic Studies:   CT ABDOMEN PELVIS W CONTRAST  Result Date: 08/04/2022 CLINICAL DATA:  Urinary retention EXAM: CT ABDOMEN AND PELVIS WITH CONTRAST TECHNIQUE: Multidetector CT imaging of the abdomen and pelvis was performed using the standard protocol following bolus administration of intravenous contrast. RADIATION DOSE REDUCTION: This exam was performed according to the departmental dose-optimization program which includes automated exposure control, adjustment of the mA and/or kV according to patient size and/or use of iterative reconstruction technique. CONTRAST:  75mL OMNIPAQUE IOHEXOL 350 MG/ML SOLN COMPARISON:  Abdomen radiographs 10/13/2019 FINDINGS: Lower chest: No acute abnormality. Hepatobiliary: Scattered tiny hypodensities are too small to definitively characterize but likely represent cysts. Irregular hypoattenuation  along the falciform ligament likely due to fatty infiltration. Unremarkable gallbladder and biliary tree. Pancreas: Unremarkable. Spleen: Unremarkable. Adrenals/Urinary Tract: Normal adrenal glands. Symmetric nonspecific perinephric stranding. Mild bilateral hydroureteronephrosis. Geographic areas of cortical hypoattenuation/hypoenhancement on delayed images compatible with pyelonephritis. Distended bladder. Stomach/Bowel: Normal caliber large and small bowel. No bowel wall thickening. Stomach is within normal limits. Small hiatal hernia. Vascular/Lymphatic: Aortic atherosclerosis. No enlarged abdominal or pelvic lymph nodes. Reproductive: Enlarged prostate. Within the superior prostate there is a peripherally enhancing fluid collection measuring 3.0 x 2.6 x 3.2 cm. Moderate bilateral hydroceles. Other: No free intraperitoneal fluid or air. Musculoskeletal: Thoracolumbar spondylosis. IMPRESSION: 1. Bilateral pyelonephritis. 2. Peripherally enhancing fluid collection in the superior prostate measuring up to 3.2 cm concerning for abscess. 3. Distended bladder with mild bilateral hydroureteronephrosis. Question bladder outlet obstruction secondary to prostate abscess. 4. Moderate bilateral hydroceles. Aortic Atherosclerosis (ICD10-I70.0). Electronically Signed   By: Minerva Fester M.D.   On: 08/04/2022 19:32     Labs:   Basic Metabolic Panel: Recent Labs  Lab 08/04/22 1433 08/04/22 2309 08/05/22 0507 08/06/22 0507 08/07/22 0450  NA 129*  --  136 133* 135  K 4.0  --  4.3 4.1 3.9  CL 95*  --  104 102 103  CO2 23  --  25 24 24   GLUCOSE 103*  --  87 92 82  BUN 13  --  16 15 12   CREATININE 1.32*  --  1.22 1.11 1.14  CALCIUM 9.3  --  9.0 8.6* 8.6*  MG  --  2.2 2.2  --   --    GFR Estimated Creatinine Clearance: 45.9 mL/min (by C-G formula based on SCr of 1.14 mg/dL). Liver Function Tests: Recent Labs  Lab 08/04/22 1433 08/05/22 0507  AST 24 18  ALT 16 12  ALKPHOS 33* 29*  BILITOT 0.9 0.8   PROT 7.5 6.8  ALBUMIN 4.0 3.5   No results for input(s): "LIPASE", "AMYLASE" in the last 168 hours. No results for input(s): "AMMONIA" in the last 168 hours. Coagulation profile Recent Labs  Lab 08/05/22 0507  INR 1.2    CBC: Recent Labs  Lab 08/04/22 1433 08/05/22 0507 08/06/22 0507 08/07/22 0450  WBC 8.0 6.5 7.7 7.2  NEUTROABS  --  4.2 5.1 4.9  HGB 11.5* 11.0* 10.5* 10.3*  HCT 35.1* 33.5* 32.3* 32.6*  MCV 86.0 85.2 86.6 88.6  PLT  180 165 160 152   Cardiac Enzymes: No results for input(s): "CKTOTAL", "CKMB", "CKMBINDEX", "TROPONINI" in the last 168 hours. BNP: Invalid input(s): "POCBNP" CBG: No results for input(s): "GLUCAP" in the last 168 hours. D-Dimer No results for input(s): "DDIMER" in the last 72 hours. Hgb A1c No results for input(s): "HGBA1C" in the last 72 hours. Lipid Profile No results for input(s): "CHOL", "HDL", "LDLCALC", "TRIG", "CHOLHDL", "LDLDIRECT" in the last 72 hours. Thyroid function studies No results for input(s): "TSH", "T4TOTAL", "T3FREE", "THYROIDAB" in the last 72 hours.  Invalid input(s): "FREET3" Anemia work up No results for input(s): "VITAMINB12", "FOLATE", "FERRITIN", "TIBC", "IRON", "RETICCTPCT" in the last 72 hours. Microbiology Recent Results (from the past 240 hour(s))  Urine Culture     Status: Abnormal   Collection Time: 07/31/22  3:18 PM   Specimen: Urine, Clean Catch  Result Value Ref Range Status   Specimen Description URINE, CLEAN CATCH  Final   Special Requests   Final    NONE Performed at [facility], [address], [place], [place] [address]    Culture >=100,000 COLONIES/mL KLEBSIELLA PNEUMONIAE (A)  Final   Report Status 08/02/2022 FINAL  Final   Organism ID, Bacteria KLEBSIELLA PNEUMONIAE (A)  Final      Susceptibility   Klebsiella pneumoniae - MIC*    AMPICILLIN RESISTANT Resistant     CEFAZOLIN <=4 SENSITIVE Sensitive     CEFEPIME <=0.12 SENSITIVE Sensitive     CEFTRIAXONE <=0.25 SENSITIVE  Sensitive     CIPROFLOXACIN <=0.25 SENSITIVE Sensitive     GENTAMICIN <=1 SENSITIVE Sensitive     IMIPENEM <=0.25 SENSITIVE Sensitive     NITROFURANTOIN 64 INTERMEDIATE Intermediate     TRIMETH/SULFA <=20 SENSITIVE Sensitive     AMPICILLIN/SULBACTAM <=2 SENSITIVE Sensitive     PIP/TAZO <=4 SENSITIVE Sensitive     * >=100,000 COLONIES/mL KLEBSIELLA PNEUMONIAE  Urine Culture     Status: None   Collection Time: 08/05/22  5:39 AM   Specimen: Urine, Clean Catch  Result Value Ref Range Status   Specimen Description   Final    URINE, CLEAN CATCH Performed at [facility] [address] [place], [place] [address]    Special Requests   Final    NONE Performed at [facility], [address], [place], [place] [address]    Culture   Final    NO GROWTH Performed at [facility], [address] [place], [place] [address]    Report Status 08/06/2022 FINAL  Final  Surgical PCR screen     Status: None   Collection Time: 08/06/22 11:27 AM   Specimen: Nasal Mucosa; Nasal Swab  Result Value Ref Range Status   MRSA, PCR NEGATIVE NEGATIVE Final   Staphylococcus aureus NEGATIVE NEGATIVE Final    Comment: (NOTE) The Xpert SA Assay (FDA approved for NASAL specimens in patients 65 years of age and older), is one component of a comprehensive surveillance program. It is not intended to diagnose infection nor to guide or monitor treatment. Performed at [facility], [address] [place], [place] [address]     Time coordinating discharge: 45 minutes  Signed: Melina Schools Efrata Brunner  [facility] 08/07/2022, 1:13 PM

## 2022-08-07 NOTE — Progress Notes (Signed)
1 Day Post-Op Subjective: Patient reports feeling well.  Only complaint is of cath discomfort.  Denies N/V.  Eating regular diet. AF and labs stable.  Objective: Vital signs in last 24 hours: Temp:  [98 F (36.7 C)-98.4 F (36.9 C)] 98.2 F (36.8 C) (06/20 0408) Pulse Rate:  [58-72] 72 (06/20 0408) Resp:  [17-19] 17 (06/20 0408) BP: (137-184)/(58-80) 159/73 (06/20 0408) SpO2:  [98 %-100 %] 98 % (06/20 0408) Weight:  [63.8 kg] 63.8 kg (06/20 0500)  Intake/Output from previous day: 06/19 0701 - 06/20 0700 In: 4450 [I.V.:700] Out: 6175 [Urine:6175] Intake/Output this shift: Total I/O In: -  Out: 625 [Urine:625]  Physical Exam:  General:alert, cooperative, and no distress GI: abd soft NT ND Cath in place with pink urine in tubing. CBI off    Lab Results: Recent Labs    08/05/22 0507 08/06/22 0507 08/07/22 0450  HGB 11.0* 10.5* 10.3*  HCT 33.5* 32.3* 32.6*   BMET Recent Labs    08/06/22 0507 08/07/22 0450  NA 133* 135  K 4.1 3.9  CL 102 103  CO2 24 24  GLUCOSE 92 82  BUN 15 12  CREATININE 1.11 1.14  CALCIUM 8.6* 8.6*   Recent Labs    08/05/22 0507  INR 1.2   No results for input(s): "LABURIN" in the last 72 hours. Results for orders placed or performed during the hospital encounter of 08/04/22  Urine Culture     Status: None   Collection Time: 08/05/22  5:39 AM   Specimen: Urine, Clean Catch  Result Value Ref Range Status   Specimen Description   Final    URINE, CLEAN CATCH Performed at [facility], [address] [place], [place] [address]    Special Requests   Final    NONE Performed at [facility], [address], [place], [place] [address]    Culture   Final    NO GROWTH Performed at [facility], [address], [place], [place] [address]    Report Status 08/06/2022 FINAL  Final  Surgical PCR screen     Status: None   Collection Time: 08/06/22 11:27 AM   Specimen: Nasal Mucosa; Nasal Swab   Result Value Ref Range Status   MRSA, PCR NEGATIVE NEGATIVE Final   Staphylococcus aureus NEGATIVE NEGATIVE Final    Comment: (NOTE) The Xpert SA Assay (FDA approved for NASAL specimens in patients 36 years of age and older), is one component of a comprehensive surveillance program. It is not intended to diagnose infection nor to guide or monitor treatment. Performed at [facility], [address] [place], [place] [address]     Studies/Results: No results found.  Assessment/Plan: 1 Day Post-Op Procedure(s) (LRB): TRANSURETHRAL RESECTION OF THE PROSTATE (TURP) UNROOFING PROSTATIC ABSCESS (N/A) Doing well Continue foley.  Will arrange outpt void trial with Dr. Annabell Howells next week Continue Abx and flomax D/c planning per IM   LOS: 3 days   Harrie Foreman 08/07/2022, 10:11 AM

## 2022-08-08 ENCOUNTER — Ambulatory Visit: Payer: Self-pay

## 2022-08-08 ENCOUNTER — Telehealth: Payer: Self-pay | Admitting: *Deleted

## 2022-08-08 ENCOUNTER — Encounter: Payer: Self-pay | Admitting: *Deleted

## 2022-08-08 DIAGNOSIS — N10 Acute tubulo-interstitial nephritis: Secondary | ICD-10-CM

## 2022-08-08 LAB — SURGICAL PATHOLOGY

## 2022-08-08 NOTE — Transitions of Care (Post Inpatient/ED Visit) (Addendum)
08/08/2022  Name: Evan Escobar MRN: [id number] DOB: [birth year]  Today's TOC FU Call Status: Today's TOC FU Call Status:: Successful TOC FU Call Competed TOC FU Call Complete Date: 08/08/22  Transition Care Management Follow-up Telephone Call Date of Discharge: 08/07/22 Discharge Facility: [facility]) Type of Discharge: Inpatient Admission Primary Inpatient Discharge Diagnosis:: pyelonephritis, acute urinary retention, TURP How have you been since you were released from the hospital?: Same ("I am doing okay I guess, but this catheter is so uncomfortable, it is bothering me.  It is uncomfortable.  I have an appointment with the urologist but it is not until 08/22/22.  Thanks for getting me in with Dr. Okey Dupre next week") Any questions or concerns?: Yes Patient Questions/Concerns:: Indwelling Foley catheter post-discharge- new to patient and is uncomfortable; reports "a few drops pf blood on my penis every now and then;" confirms it is not actively bleeding Patient Questions/Concerns Addressed: Other: (facilitated scheduling of HFU OV with PCP as per discharge instructions; provided extensive instructions around foley catheter care/ maintenance at home; provided my direct number for any ongoing issues; scheduled with RN CM for follow up call)  Items Reviewed: Did you receive and understand the discharge instructions provided?: Yes (thoroughly reviewed with patient and his spouse who verbalizes good understanding of same) Medications obtained,verified, and reconciled?: Yes (Medications Reviewed) (Full medication reconciliation/ review completed; no concerns or discrepancies identified; confirmed patient obtained/ is taking all newly Rx'd medications as instructed; self-manages medications and denies questions/ concerns around medications today) Any new allergies since your discharge?: No Dietary orders reviewed?: Yes Type of Diet Ordered:: "As healthy as possible" Do you have support at  home?: Yes People in Home: spouse Name of Support/Comfort Primary Source: Reports essentially independent in self-care activities; supportive spouse assists as/ if needed/ indicated  Medications Reviewed Today: Medications Reviewed Today     Reviewed by Michaela Corner, RN (Registered Nurse) on 08/08/22 at 1154  Med List Status: <None>   Medication Order Taking? Sig Documenting Provider Last Dose Status Informant  acetaminophen (TYLENOL) 500 MG tablet [id number] Yes Take 500 mg by mouth every 6 (six) hours as needed for moderate pain or headache.  [provider] Taking Active Spouse/Significant Other  amoxicillin-clavulanate (AUGMENTIN) 875-125 MG tablet [id number] Yes Take 1 tablet by mouth 2 (two) times daily for 7 days. Lorin Glass, MD Taking Active   aspirin 81 MG tablet [id number] Yes Take 81 mg by mouth daily. [provider] Taking Active Spouse/Significant Other  brimonidine (ALPHAGAN P) 0.1 % SOLN [id number] Yes Place 1 drop into both eyes 2 (two) times daily. [provider] Taking Active Spouse/Significant Other  cycloSPORINE (RESTASIS) 0.05 % ophthalmic emulsion [id number] Yes Place 1 drop into both eyes 2 (two) times daily. [provider] Taking Active Spouse/Significant Other  latanoprost (XALATAN) 0.005 % ophthalmic solution [id number] Yes Place 1 drop into both eyes at bedtime. [provider] Taking Active Spouse/Significant Other  levETIRAcetam (KEPPRA) 500 MG tablet [id number] Yes TAKE 1 TABLET(500 MG) BY MOUTH TWICE DAILY  Patient taking differently: Take 500 mg by mouth 2 (two) times daily.   Levert Feinstein, MD Taking Active Spouse/Significant Other  lisinopril (ZESTRIL) 20 MG tablet [id number] Yes TAKE 1 TABLET(20 MG) BY MOUTH DAILY  Patient taking differently: Take 20 mg by mouth daily.   Myrlene Broker, MD Taking Active Spouse/Significant Other  lubiprostone (AMITIZA) 24 MCG capsule [id number] No Take 1 capsule (24 mcg total) by  mouth 2 (two) times daily with a meal.  Patient not taking: Reported on 08/08/2022   Myrlene Broker, MD Not Taking Active Spouse/Significant Other           Med Note Michaela Corner   Fri Aug 08, 2022 11:52 AM) 08/08/22: reports during [facility] call-- no longer taking; states "I don't even know what that medicine is for"   magnesium hydroxide (MILK OF MAGNESIA) 400 MG/5ML suspension [id number] Yes Take 30 mLs by mouth daily as needed for mild constipation. [provider] Taking Active Spouse/Significant Other  Multiple Vitamin (MULTIVITAMIN) tablet [id number] Yes Take 1 tablet by mouth daily. [provider] Taking Active Spouse/Significant Other  polyethylene glycol powder (GLYCOLAX/MIRALAX) 17 GM/SCOOP powder [id number] Yes Take 17 g by mouth 2 (two) times daily as needed.  Patient taking differently: Take 17 g by mouth 2 (two) times daily as needed for moderate constipation.   Myrlene Broker, MD Taking Active Spouse/Significant Other  saccharomyces boulardii (FLORASTOR) 250 MG capsule [id number] No Take 1 capsule (250 mg total) by mouth 2 (two) times daily for 7 days.  Patient not taking: Reported on 08/08/2022   Lorin Glass, MD Not Taking Active            Med Note Michaela Corner   Fri Aug 08, 2022 11:50 AM) 08/08/22: reports during [facility] call has not yet obtained; advised to obtain and start as prescribed- verbalizes understanding, states will do  tamsulosin (FLOMAX) 0.4 MG CAPS capsule [id number] Yes Take 1 capsule (0.4 mg total) by mouth in the morning and at bedtime.  Patient taking differently: Take 0.4 mg by mouth daily.   Myrlene Broker, MD Taking Active Spouse/Significant Other           Med Note Michaela Corner   Fri Aug 08, 2022 11:54 AM) 08/08/22: Reports during TOC call he is currently taking every day-- provided education; he stated he would start taking BID            Home Care and Equipment/Supplies: Were Home Health Services Ordered?:  No Any new equipment or medical supplies ordered?: No  Functional Questionnaire: Do you need assistance with bathing/showering or dressing?: Yes (spouse assisting as indicated post-recent hospital discharge) Do you need assistance with meal preparation?: Yes (spouse assisting as indicated post-recent hospital discharge) Do you need assistance with eating?: No Do you have difficulty maintaining continence: Yes (spouse assisting with foley catheter care/ maintenance as indicated post-recent hospital discharge) Do you need assistance with getting out of bed/getting out of a chair/moving?: No Do you have difficulty managing or taking your medications?: Yes (spouse assisting as indicated post-recent hospital discharge)  Follow up appointments reviewed: PCP Follow-up appointment confirmed?: Yes (care coordination outreach in real-time with scheduling care guide to successfully schedule hospital follow up PCP appointment 08/13/22) Date of PCP follow-up appointment?: 08/13/22 Follow-up Provider: PCP Specialist Hospital Follow-up appointment confirmed?: Yes Date of Specialist follow-up appointment?: 08/22/22 Follow-Up Specialty Provider:: [facility] Do you need transportation to your follow-up appointment?: No Do you understand care options if your condition(s) worsen?: Yes-patient verbalized understanding  SDOH Interventions Today    Flowsheet Row Most Recent Value  SDOH Interventions   Food Insecurity Interventions Other (Comment)  [denies true food insecurity, but would like resources for food acquisition given current economy/ grocery prices,  placed [employer] Guide referral]  Transportation Interventions Intervention Not Indicated  [spouse provides transportation]      TOC Interventions Today    Flowsheet Row Most Recent Value  TOC Interventions   TOC  Interventions Discussed/Reviewed TOC Interventions Discussed, Arranged PCP follow up within 7 days/Care Guide scheduled   [provided my direct contact information should questions/ concerns/ needs arise post-TOC call, prior to RN CM telephone visit  08/14/22]      Interventions Today    Flowsheet Row Most Recent Value  Chronic Disease   Chronic disease during today's visit Other  [pyleonephritis,  urinary retention,  TURP]  General Interventions   General Interventions Discussed/Reviewed General Interventions Discussed, Doctor Visits, Referral to Nurse, Communication with, [employer], Horticulturist, commercial (DME)  [Community Resource Care Guide referral placed for provision of food resources]  Doctor Visits Discussed/Reviewed Doctor Visits Discussed, Specialist, PCP  Durable Medical Equipment (DME) Other, Suszanne Conners indwelling foley catheter in place post- discharge,  has cane/ walker and uses both as needed]  PCP/Specialist Visits Compliance with follow-up visit  Communication with RN  [scheduled with RN CM Care Coordinator for 08/14/22]  Education Interventions   Education Provided Provided Education  Provided Verbal Education On Medication, Other, [employer] of taking pro-biotic as prescribed in setting of antibiotic therapy,  extensive education around foley care/ maintenance at home,  purpose of/ need to complete updated DPR form at PCP office,  possible food resources available]  Nutrition Interventions   Nutrition Discussed/Reviewed Nutrition Discussed  Pharmacy Interventions   Pharmacy Dicussed/Reviewed Pharmacy Topics Discussed  [Full medication review with updating medication list in EHR per patient report]  Safety Interventions   Safety Discussed/Reviewed Safety Discussed      Caryl Pina, RN, BSN, CCRN Alumnus RN CM Care Coordination/ Transition of Care- [facility] Care Management [phone]: direct office

## 2022-08-08 NOTE — Chronic Care Management (AMB) (Signed)
   08/08/2022  Evan Escobar 04-29-1940 [id number]   Reason for Encounter Patient is not currently enrolled in the CCM program. CCM status changed to "Previously enrolled"   [facility]/Chronic Care Management [phone]

## 2022-08-11 ENCOUNTER — Telehealth: Payer: Self-pay | Admitting: *Deleted

## 2022-08-11 NOTE — Telephone Encounter (Signed)
   Telephone encounter was:  Successful.  08/11/2022 Name: Evan Escobar MRN: [id number] DOB: 28-Feb-1940  Evan Escobar is a 82 y.o. year old male who is a primary care patient of Okey Dupre Austin Miles, MD . The community resource team was consulted for assistance with Food Insecurity  Care guide performed the following interventions: Patient provided with information about care guide support team and interviewed to confirm resource needs. Will mail food bank lists to patient  Follow Up Plan:  No further follow up planned at this time. The patient has been provided with needed resources. Yehuda Mao Greenauer -[facility], Population Health [phone] [place] , [place] [address] Email : [email or web address]

## 2022-08-12 ENCOUNTER — Ambulatory Visit: Payer: Medicare Other | Admitting: Podiatry

## 2022-08-13 ENCOUNTER — Telehealth: Payer: Self-pay | Admitting: Internal Medicine

## 2022-08-13 ENCOUNTER — Ambulatory Visit (INDEPENDENT_AMBULATORY_CARE_PROVIDER_SITE_OTHER): Payer: Medicare Other | Admitting: Internal Medicine

## 2022-08-13 ENCOUNTER — Encounter: Payer: Self-pay | Admitting: Internal Medicine

## 2022-08-13 VITALS — BP 120/80 | HR 84 | Temp 98.4°F | Ht 67.0 in | Wt 141.0 lb

## 2022-08-13 DIAGNOSIS — R338 Other retention of urine: Secondary | ICD-10-CM

## 2022-08-13 DIAGNOSIS — K5904 Chronic idiopathic constipation: Secondary | ICD-10-CM | POA: Diagnosis not present

## 2022-08-13 DIAGNOSIS — N390 Urinary tract infection, site not specified: Secondary | ICD-10-CM | POA: Diagnosis not present

## 2022-08-13 NOTE — Progress Notes (Signed)
   Subjective:   Patient ID: Evan Escobar, male    DOB: March 13, 1940, 82 y.o.   MRN: [id number]  HPI The patient is an 82 YO man coming in for hospital follow up. He is still finishing antibiotics and has catheter. Visit in mid-July with urology to assess removal. He denies fevers, nausea, vomiting. Is eating but appetite is poor. He denies diarrhea. Still constipation and has not resumed amitiza which we prescribed last time. He has filled this but got confused about what it was for.   PMH, [facility], social history reviewed and updated  Review of Systems  Constitutional:  Positive for appetite change and fatigue. Negative for activity change and unexpected weight change.  HENT: Negative.    Eyes: Negative.   Respiratory:  Negative for cough, chest tightness and shortness of breath.   Cardiovascular:  Negative for chest pain, palpitations and leg swelling.  Gastrointestinal:  Negative for abdominal distention, abdominal pain, constipation, diarrhea, nausea and vomiting.  Genitourinary:        Discomfort from catheter  Musculoskeletal: Negative.   Skin: Negative.   Neurological: Negative.   Psychiatric/Behavioral: Negative.      Objective:  Physical Exam Constitutional:      Appearance: He is well-developed.  HENT:     Head: Normocephalic and atraumatic.  Cardiovascular:     Rate and Rhythm: Normal rate and regular rhythm.  Pulmonary:     Effort: Pulmonary effort is normal. No respiratory distress.     Breath sounds: Normal breath sounds. No wheezing or rales.  Abdominal:     General: Bowel sounds are normal. There is no distension.     Palpations: Abdomen is soft.     Tenderness: There is no abdominal tenderness. There is no rebound.  Musculoskeletal:     Cervical back: Normal range of motion.  Skin:    General: Skin is warm and dry.  Neurological:     Mental Status: He is alert and oriented to person, place, and time.     Coordination: Coordination abnormal.     Vitals:    08/13/22 0952  BP: 120/80  Pulse: 84  Temp: 98.4 F (36.9 C)  TempSrc: Oral  SpO2: 97%  Weight: 141 lb (64 kg)  Height: 5\' 7"  (1.702 m)    Assessment & Plan:  Visit time 25 minutes in face to face communication with patient and coordination of care, additional 5 minutes spent in record review, coordination or care, ordering tests, communicating/referring to other healthcare professionals, documenting in medical records all on the same day of the visit for total time 30 minutes spent on the visit.

## 2022-08-13 NOTE — Assessment & Plan Note (Signed)
Using catheter until visit with urology. We discussed abscess drainage and likely they are waiting until inflammation is decreased to give best chance of success with removal.

## 2022-08-13 NOTE — Telephone Encounter (Signed)
Patient had a referral to a urologist in [facility]. He said he cannot drive to [facility] and would like a referral to a urologist in [place]. Best callback is [phone].

## 2022-08-13 NOTE — Patient Instructions (Addendum)
Lubiprostone (amitiza) is for constipation so start taking this 1 pill twice a day to help.   Finish the antibiotics.

## 2022-08-13 NOTE — Assessment & Plan Note (Signed)
Strongly encouraged to start amitiza 24 mcg BID which he has at home. Talked to patient and wife and written down information for him. The chronic constipation can contribute to urinary retention.

## 2022-08-13 NOTE — Assessment & Plan Note (Signed)
Finishing augmentin last 2 doses. He did not have bacteria in culture at the hospital and abscess drainage was completed. He will keep catheter until visit with urology.

## 2022-08-14 ENCOUNTER — Ambulatory Visit: Payer: Self-pay

## 2022-08-14 NOTE — Patient Outreach (Signed)
  Care Coordination   Initial Visit Note   08/14/2022 Name: Evan Escobar MRN: [id number] DOB: Oct 17, 1940  Evan Escobar is a 82 y.o. year old male who sees Evan Broker, MD for primary care. I spoke with  Evan Escobar by phone today.  What matters to the patients health and wellness today?  RNCM received message per PCP requesting assistance with transportation to attend urology appointment in [place]. Mr. Evan Escobar declines assistance with transportation to a Nurse, learning disability. He states he is aware he has an appointment with urologist in [place] who plans to remove foley at that visit. Patient expressed his main problem today is getting relief from constipation.   Goals Addressed             This Visit's Progress    Assist with health managment-continue to improve post hospitalization       Interventions Today    Flowsheet Row Most Recent Value  Chronic Disease   Chronic disease during today's visit Other  [pyleonephritis,  urinary retention,  TURP]  General Interventions   General Interventions Discussed/Reviewed General Interventions Discussed, Doctor Visits, Communication with  Doctor Visits Discussed/Reviewed Doctor Visits Discussed, Doctor Visits Reviewed, PCP, Specialist  PCP/Specialist Visits Compliance with follow-up visit  Communication with PCP/Specialists  [contacted [facility]-informed patient has a follow up 08/22/22 at 8:15 (spoke with Evan Escobar). communication with PCP with patient's concerns and request regarding constipation.]  Education Interventions   Education Provided Provided Education  [re: constipation-emphasized that medications may need a little time to work]  Provided Verbal Education On Medication, When to see the doctor  [medications reviewed with patient, encouraged to take medications as recommened. reviewed medications prescribed for patient specifically for constipation. reinforced the need to maintain a good regimen to  minimize/prevent constipation]  Nutrition Interventions   Nutrition Discussed/Reviewed Nutrition Discussed, Fluid intake  [discussed increase fiber and water]  Pharmacy Interventions   Pharmacy Dicussed/Reviewed Pharmacy Topics Discussed, Medications and their functions, Medication Adherence            SDOH assessments and interventions completed:  No recently completed.   Care Coordination Interventions:  Yes, provided   Follow up plan: Follow up call scheduled for 08/29/22    Encounter Outcome:  Pt. Visit Completed   Kathyrn Sheriff, RN, MSN, BSN, CCM [facility] Care Coordinator [phone]

## 2022-08-14 NOTE — Patient Instructions (Addendum)
Visit Information  Thank you for taking time to visit with me today. Please don't hesitate to contact me if I can be of assistance to you.   Following are the goals we discussed today:  Continue to take medications as prescribed Continue to attend provider visits as scheduled Contact your provider or seek medial attention if condition does not improve or worsens   Our next appointment is by telephone on 08/29/22 at 9:45 am  Please call the care guide team at [phone] if you need to cancel or reschedule your appointment.   If you are experiencing a Mental Health or Behavioral Health Crisis or need someone to talk to, please call the Suicide and Crisis Lifeline: 38  Kathyrn Sheriff, RN, MSN, BSN, CCM [facility] Care Coordinator [phone]   Constipation, Adult Constipation is when a person has fewer than three bowel movements in a week, has difficulty having a bowel movement, or has stools (feces) that are dry, hard, or larger than normal. Constipation may be caused by an underlying condition. It may become worse with age if a person takes certain medicines and does not take in enough fluids. Follow these instructions at home: Eating and drinking  Eat foods that have a lot of fiber, such as beans, whole grains, and fresh fruits and vegetables. Limit foods that are low in fiber and high in fat and processed sugars, such as fried or sweet foods. These include french fries, hamburgers, cookies, candies, and soda. Drink enough fluid to keep your urine pale yellow. General instructions Exercise regularly or as told by your health care provider. Try to do 150 minutes of moderate exercise each week. Use the bathroom when you have the urge to go. Do not hold it in. Take over-the-counter and prescription medicines only as told by your health care provider. This includes any fiber supplements. During bowel movements: Practice deep breathing while relaxing the lower abdomen. Practice pelvic floor  relaxation. Watch your condition for any changes. Let your health care provider know about them. Keep all follow-up visits as told by your health care provider. This is important. Contact a health care provider if: You have pain that gets worse. You have a fever. You do not have a bowel movement after 4 days. You vomit. You are not hungry or you lose weight. You are bleeding from the opening between the buttocks (anus). You have thin, pencil-like stools. Get help right away if: You have a fever and your symptoms suddenly get worse. You leak stool or have blood in your stool. Your abdomen is bloated. You have severe pain in your abdomen. You feel dizzy or you faint. Summary Constipation is when a person has fewer than three bowel movements in a week, has difficulty having a bowel movement, or has stools (feces) that are dry, hard, or larger than normal. Eat foods that have a lot of fiber, such as beans, whole grains, and fresh fruits and vegetables. Drink enough fluid to keep your urine pale yellow. Take over-the-counter and prescription medicines only as told by your health care provider. This includes any fiber supplements. This information is not intended to replace advice given to you by your health care provider. Make sure you discuss any questions you have with your health care provider. Document Revised: 12/18/2021 Document Reviewed: 12/18/2021 Elsevier Patient Education  2024 [employer].

## 2022-08-15 ENCOUNTER — Telehealth: Payer: Self-pay

## 2022-08-15 NOTE — Patient Outreach (Signed)
  Care Coordination   Follow Up Visit Note   08/15/2022 Name: Evan Escobar MRN: [id number] DOB: 02-23-1940  Evan Escobar is a 82 y.o. year old male who sees Myrlene Broker, MD for primary care. I spoke with  Evan Escobar by phone today.  What matters to the patients health and wellness today?  RNCM received recommendation as to what patient can take along with Amitiza for constipation. Patient called and informed. He voiced understanding. Denies any other questions or concerns at this time.  Goals Addressed             This Visit's Progress    Assist with health managment-continue to improve post hospitalization       Interventions Today    Flowsheet Row Most Recent Value  General Interventions   General Interventions Discussed/Reviewed Communication with  Doctor Visits Discussed/Reviewed Doctor Visits Reviewed  [reiterated appointment wth [facility] scheduled for next week 08/22/22. patient confirmed understanding.]  Communication with PCP/Specialists  [per Guy Sandifer, PAC: "The preferred medication in addition to Amitiza would be miralax. mix one scoop (17g) in once daily to help with his bowels". patient provided information and advised mix with water.]            SDOH assessments and interventions completed:  No  Care Coordination Interventions:  Yes, provided   Follow up plan: Follow up call scheduled for 08/29/22    Encounter Outcome:  Pt. Visit Completed   Kathyrn Sheriff, RN, MSN, BSN, CCM [facility] Care  Coordinator [phone]

## 2022-08-15 NOTE — Patient Instructions (Addendum)
Visit Information  Thank you for taking time to visit with me today. Please don't hesitate to contact me if I can be of assistance to you.   Following are the goals we discussed today:   Per your PCP, the preferred medication in addition to Amitiza is Miralax daily. Mix one scoop(17g) as directed. Contact your PCP if condition does not improve or worsens   Our next appointment is by telephone on 08/29/22 at 9:45 am  Please call the care guide team at [phone] if you need to cancel or reschedule your appointment.   If you are experiencing a Mental Health or Behavioral Health Crisis or need someone to talk to, please call the Suicide and Crisis Lifeline: 110  Kathyrn Sheriff, RN, MSN, BSN, CCM [facility] Care Coordinator [phone]

## 2022-08-18 ENCOUNTER — Ambulatory Visit: Payer: Self-pay

## 2022-08-18 DIAGNOSIS — N472 Paraphimosis: Secondary | ICD-10-CM | POA: Diagnosis not present

## 2022-08-18 NOTE — Patient Instructions (Signed)
Visit Information  Thank you for taking time to visit with me today. Please don't hesitate to contact me if I can be of assistance to you.   Following are the goals we discussed today:  Contact [facility] Specialist [phone] regarding questions/concerns as needed. Your follow up appointment with [facility] Specialist is scheduled for 08/22/22 at 8:15. Please contact your RN Care coordinator if you have transportation needs at least 3 days prior to appointment if needed  Our next appointment is by telephone on 08/29/22 at 9:45 am  Please call the care guide team at [phone] if you need to cancel or reschedule your appointment.   If you are experiencing a Mental Health or Behavioral Health Crisis or need someone to talk to, please call the Suicide and Crisis Lifeline: 74  Kathyrn Sheriff, RN, MSN, BSN, CCM [facility] Care Coordinator [phone]

## 2022-08-18 NOTE — Patient Outreach (Signed)
  Care Coordination   Follow Up Visit Note   08/18/2022 Name: Tyriece Patak MRN: [id number] DOB: 06/17/1940  Frandy Edmison is a 82 y.o. year old male who sees Myrlene Broker, MD for primary care. I spoke with  [name] by phone today.  What matters to the patients health and wellness today?  Mr. Grap with upcoming appointment with urology. RNCM called to re-confirm patient still has transportation.  Goals Addressed             This Visit's Progress    Assist with health managment-continue to improve post hospitalization       Interventions Today    Flowsheet Row Most Recent Value  General Interventions   General Interventions Discussed/Reviewed Communication with  Communication with PCP/Specialists  [facility] to reconfirm patient's appointment for 08/22/22 at 8:15 am( spent 37 min on hold between two calls). Patient called to reiterate this appointment-patient confirms that he has transportation to this appointment.]  Education Interventions   Education Provided Provided Education  Provided Verbal Education On Other  [facility] advised patient to contact urologist at [phone] with any questions or concerns as needed]            SDOH assessments and interventions completed:  No  Care Coordination Interventions:  Yes, provided   Follow up plan:  as previously scheduled    Encounter Outcome:  Pt. Visit Completed   Kathyrn Sheriff, RN, MSN, BSN, CCM [facility] Care Coordinator [phone]

## 2022-08-25 DIAGNOSIS — R8271 Bacteriuria: Secondary | ICD-10-CM | POA: Diagnosis not present

## 2022-08-29 ENCOUNTER — Ambulatory Visit: Payer: Self-pay

## 2022-08-29 NOTE — Patient Instructions (Addendum)
Visit Information  Thank you for taking time to visit with me today. Please don't hesitate to contact me if I can be of assistance to you.   Following are the goals we discussed today:  Continue to take medications as prescribed Continue to attend provider visits as scheduled Contact your provider with health questions or concerns as needed Follow up with urologist at [phone] as scheduled and with any questions concerns   Our next appointment is by telephone on 10/02/22 at 9:30 am  Please call the care guide team at [phone] if you need to cancel or reschedule your appointment.   If you are experiencing a Mental Health or Behavioral Health Crisis or need someone to talk to, please call the Suicide and Crisis Lifeline: 24  Kathyrn Sheriff, RN, MSN, BSN, CCM [facility] Care Coordinator [phone]

## 2022-08-29 NOTE — Patient Outreach (Signed)
  Care Coordination   Follow Up Visit Note   08/29/2022 Name: Javian Bart MRN: [id number] DOB: 06/21/1940  Rooney Chernesky is a 82 y.o. year old male who sees Myrlene Broker, MD for primary care. I spoke with  [name] by phone today.  What matters to the patients health and wellness today?  Reports went to urology visit and states trial taking foley out, but states foley placed back in. He reports a follow up appointment with urologist next Tuesday. Reports it is still uncomfortable, but reports it is getting better. Mr. Kehoe denies any questions or concerns at this time.   Goals Addressed             This Visit's Progress    Assist with health managment-continue to improve post hospitalization       Interventions Today    Flowsheet Row Most Recent Value  Chronic Disease   Chronic disease during today's visit Other  [urinary retention,  TURP]  General Interventions   General Interventions Discussed/Reviewed General Interventions Reviewed, Doctor Visits  Doctor Visits Discussed/Reviewed Doctor Visits Discussed, Specialist  [Confirmed patient attended follow up visit with urologist]  PCP/Specialist Visits Compliance with follow-up visit  Education Interventions   Provided Verbal Education On --  [encouraged to continue to attend provider visits as scheduled. advised patient to contact urologist at [phone] with any questions concerns regarding TURP.]            SDOH assessments and interventions completed:  No  Care Coordination Interventions:  Yes, provided   Follow up plan: Follow up call scheduled for 10/02/22    Encounter Outcome:  Pt. Visit Completed   Kathyrn Sheriff, RN, MSN, BSN, CCM [facility] Care Coordinator [phone]

## 2022-09-03 DIAGNOSIS — L851 Acquired keratosis [keratoderma] palmaris et plantaris: Secondary | ICD-10-CM | POA: Diagnosis not present

## 2022-09-03 DIAGNOSIS — M792 Neuralgia and neuritis, unspecified: Secondary | ICD-10-CM | POA: Diagnosis not present

## 2022-09-03 DIAGNOSIS — M2012 Hallux valgus (acquired), left foot: Secondary | ICD-10-CM | POA: Diagnosis not present

## 2022-09-03 DIAGNOSIS — I739 Peripheral vascular disease, unspecified: Secondary | ICD-10-CM | POA: Diagnosis not present

## 2022-09-09 DIAGNOSIS — R338 Other retention of urine: Secondary | ICD-10-CM | POA: Diagnosis not present

## 2022-09-25 ENCOUNTER — Ambulatory Visit (INDEPENDENT_AMBULATORY_CARE_PROVIDER_SITE_OTHER): Payer: Medicare Other

## 2022-09-25 VITALS — Ht 67.5 in | Wt 124.0 lb

## 2022-09-25 DIAGNOSIS — Z0000 Encounter for general adult medical examination without abnormal findings: Secondary | ICD-10-CM

## 2022-09-25 NOTE — Patient Instructions (Signed)
Evan Escobar , Thank you for taking time to come for your Medicare Wellness Visit. I appreciate your ongoing commitment to your health goals. Please review the following plan we discussed and let me know if I can assist you in the future.   Referrals/Orders/Follow-Ups/Clinician Recommendations: Your Flu vaccine is due soon and you can get this done at your pharmacy of choice.  Remember to call [facility] at [facility] to schedule your lung cancer screening.  [phone]).  It was nice speaking with you.  Hopes for a speedy recovery for you.  This is a list of the screening recommended for you and due dates:  Health Maintenance  Topic Date Due   Flu Shot  12/18/2022*   COVID-19 Vaccine (3 - 2023-24 season) 01/16/2023*   Zoster (Shingles) Vaccine (1 of 2) 02/16/2023*   Medicare Annual Wellness Visit  09/25/2023   DTaP/Tdap/Td vaccine (2 - Td or Tdap) 08/16/2024   Pneumonia Vaccine  Completed   HPV Vaccine  Aged Out  *Topic was postponed. The date shown is not the original due date.    Advanced directives: (Copy Requested) Please bring a copy of your health care power of attorney and living will to the office to be added to your chart at your convenience.  Next Medicare Annual Wellness Visit scheduled for next year: No  Preventive Care 22 Years and Older, Male  Preventive care refers to lifestyle choices and visits with your health care provider that can promote health and wellness. What does preventive care include? A yearly physical exam. This is also called an annual well check. Dental exams once or twice a year. Routine eye exams. Ask your health care provider how often you should have your eyes checked. Personal lifestyle choices, including: Daily care of your teeth and gums. Regular physical activity. Eating a healthy diet. Avoiding tobacco and drug use. Limiting alcohol use. Practicing safe sex. Taking low doses of aspirin every day. Taking vitamin  and mineral supplements as recommended by your health care provider. What happens during an annual well check? The services and screenings done by your health care provider during your annual well check will depend on your age, overall health, lifestyle risk factors, and family history of disease. Counseling  Your health care provider may ask you questions about your: Alcohol use. Tobacco use. Drug use. Emotional well-being. Home and relationship well-being. Sexual activity. Eating habits. History of falls. Memory and ability to understand (cognition). Work and work Astronomer. Screening  You may have the following tests or measurements: Height, weight, and BMI. Blood pressure. Lipid and cholesterol levels. These may be checked every 5 years, or more frequently if you are over 39 years old. Skin check. Lung cancer screening. You may have this screening every year starting at age 92 if you have a 30-pack-year history of smoking and currently smoke or have quit within the past 15 years. Fecal occult blood test (FOBT) of the stool. You may have this test every year starting at age 27. Flexible sigmoidoscopy or colonoscopy. You may have a sigmoidoscopy every 5 years or a colonoscopy every 10 years starting at age 43. Prostate cancer screening. Recommendations will vary depending on your family history and other risks. Hepatitis C blood test. Hepatitis B blood test. Sexually transmitted disease (STD) testing. Diabetes screening. This is done by checking your blood sugar (glucose) after you have not eaten for a while (fasting). You may have this done every 1-3 years. Abdominal aortic aneurysm (AAA) screening. You  may need this if you are a current or former smoker. Osteoporosis. You may be screened starting at age 79 if you are at high risk. Talk with your health care provider about your test results, treatment options, and if necessary, the need for more tests. Vaccines  Your health care  provider may recommend certain vaccines, such as: Influenza vaccine. This is recommended every year. Tetanus, diphtheria, and acellular pertussis (Tdap, Td) vaccine. You may need a Td booster every 10 years. Zoster vaccine. You may need this after age 27. Pneumococcal 13-valent conjugate (PCV13) vaccine. One dose is recommended after age 77. Pneumococcal polysaccharide (PPSV23) vaccine. One dose is recommended after age 4. Talk to your health care provider about which screenings and vaccines you need and how often you need them. This information is not intended to replace advice given to you by your health care provider. Make sure you discuss any questions you have with your health care provider. Document Released: 03/02/2015 Document Revised: 10/24/2015 Document Reviewed: 12/05/2014 Elsevier Interactive Patient Education  2017 [employer].  Fall Prevention in the Home Falls can cause injuries. They can happen to people of all ages. There are many things you can do to make your home safe and to help prevent falls. What can I do on the outside of my home? Regularly fix the edges of walkways and driveways and fix any cracks. Remove anything that might make you trip as you walk through a door, such as a raised step or threshold. Trim any bushes or trees on the path to your home. Use bright outdoor lighting. Clear any walking paths of anything that might make someone trip, such as rocks or tools. Regularly check to see if handrails are loose or broken. Make sure that both sides of any steps have handrails. Any raised decks and porches should have guardrails on the edges. Have any leaves, snow, or ice cleared regularly. Use sand or salt on walking paths during winter. Clean up any spills in your garage right away. This includes oil or grease spills. What can I do in the bathroom? Use night lights. Install grab bars by the toilet and in the tub and shower. Do not use towel bars as grab  bars. Use non-skid mats or decals in the tub or shower. If you need to sit down in the shower, use a plastic, non-slip stool. Keep the floor dry. Clean up any water that spills on the floor as soon as it happens. Remove soap buildup in the tub or shower regularly. Attach bath mats securely with double-sided non-slip rug tape. Do not have throw rugs and other things on the floor that can make you trip. What can I do in the bedroom? Use night lights. Make sure that you have a light by your bed that is easy to reach. Do not use any sheets or blankets that are too big for your bed. They should not hang down onto the floor. Have a firm chair that has side arms. You can use this for support while you get dressed. Do not have throw rugs and other things on the floor that can make you trip. What can I do in the kitchen? Clean up any spills right away. Avoid walking on wet floors. Keep items that you use a lot in easy-to-reach places. If you need to reach something above you, use a strong step stool that has a grab bar. Keep electrical cords out of the way. Do not use floor polish or wax that  makes floors slippery. If you must use wax, use non-skid floor wax. Do not have throw rugs and other things on the floor that can make you trip. What can I do with my stairs? Do not leave any items on the stairs. Make sure that there are handrails on both sides of the stairs and use them. Fix handrails that are broken or loose. Make sure that handrails are as long as the stairways. Check any carpeting to make sure that it is firmly attached to the stairs. Fix any carpet that is loose or worn. Avoid having throw rugs at the top or bottom of the stairs. If you do have throw rugs, attach them to the floor with carpet tape. Make sure that you have a light switch at the top of the stairs and the bottom of the stairs. If you do not have them, ask someone to add them for you. What else can I do to help prevent  falls? Wear shoes that: Do not have high heels. Have rubber bottoms. Are comfortable and fit you well. Are closed at the toe. Do not wear sandals. If you use a stepladder: Make sure that it is fully opened. Do not climb a closed stepladder. Make sure that both sides of the stepladder are locked into place. Ask someone to hold it for you, if possible. Clearly mark and make sure that you can see: Any grab bars or handrails. First and last steps. Where the edge of each step is. Use tools that help you move around (mobility aids) if they are needed. These include: Canes. Walkers. Scooters. Crutches. Turn on the lights when you go into a dark area. Replace any light bulbs as soon as they burn out. Set up your furniture so you have a clear path. Avoid moving your furniture around. If any of your floors are uneven, fix them. If there are any pets around you, be aware of where they are. Review your medicines with your doctor. Some medicines can make you feel dizzy. This can increase your chance of falling. Ask your doctor what other things that you can do to help prevent falls. This information is not intended to replace advice given to you by your health care provider. Make sure you discuss any questions you have with your health care provider. Document Released: 11/30/2008 Document Revised: 07/12/2015 Document Reviewed: 03/10/2014 Elsevier Interactive Patient Education  2017 [employer].

## 2022-09-25 NOTE — Progress Notes (Signed)
Subjective:   Evan Escobar is a 82 y.o. male who presents for Medicare Annual/Subsequent preventive examination.  Visit Complete: Virtual  I connected with  Evan Escobar on 09/25/22 by a audio enabled telemedicine application and verified that I am speaking with the correct person using two identifiers.  Patient Location: Home  Provider Location: Office/Clinic  I discussed the limitations of evaluation and management by telemedicine. The patient expressed understanding and agreed to proceed.  Vital Signs: Vital signs are patient reported.   Review of Systems    Cardiac Risk Factors include: advanced age (>44men, >68 women);hypertension;male gender     Objective:    Today's Vitals   09/25/22 1435 09/25/22 1436  Weight: 124 lb (56.2 kg)   Height: 5' 7.5" (1.715 m)   PainSc:  8    Body mass index is 19.13 kg/m.     09/25/2022    2:45 PM 08/04/2022   11:00 PM 08/04/2022    2:20 PM 05/22/2017    3:15 PM 05/13/2017    8:48 AM 05/13/2017    8:47 AM 08/11/2016    8:02 AM  Advanced Directives  Does Patient Have a Medical Advance Directive? Yes No No No  No No  Type of Estate agent of [place];Living will        Copy of Healthcare Power of Attorney in Chart? No - copy requested        Would patient like information on creating a medical advance directive?  No - Patient declined  No - Patient declined No - Patient declined  No - Patient declined    Current Medications (verified) Outpatient Encounter Medications as of 09/25/2022  Medication Sig   acetaminophen (TYLENOL) 500 MG tablet Take 500 mg by mouth every 6 (six) hours as needed for moderate pain or headache.    aspirin 81 MG tablet Take 81 mg by mouth daily.   brimonidine (ALPHAGAN P) 0.1 % SOLN Place 1 drop into both eyes 2 (two) times daily.   cycloSPORINE (RESTASIS) 0.05 % ophthalmic emulsion Place 1 drop into both eyes 2 (two) times daily.   latanoprost (XALATAN) 0.005 % ophthalmic solution Place  1 drop into both eyes at bedtime.   lisinopril (ZESTRIL) 20 MG tablet TAKE 1 TABLET(20 MG) BY MOUTH DAILY (Patient taking differently: Take 20 mg by mouth daily.)   magnesium hydroxide (MILK OF MAGNESIA) 400 MG/5ML suspension Take 30 mLs by mouth daily as needed for mild constipation.   Multiple Vitamin (MULTIVITAMIN) tablet Take 1 tablet by mouth daily.   polyethylene glycol powder (GLYCOLAX/MIRALAX) 17 GM/SCOOP powder Take 17 g by mouth 2 (two) times daily as needed. (Patient taking differently: Take 17 g by mouth 2 (two) times daily as needed for moderate constipation.)   tamsulosin (FLOMAX) 0.4 MG CAPS capsule Take 1 capsule (0.4 mg total) by mouth in the morning and at bedtime. (Patient taking differently: Take 0.4 mg by mouth daily.)   levETIRAcetam (KEPPRA) 500 MG tablet TAKE 1 TABLET(500 MG) BY MOUTH TWICE DAILY (Patient taking differently: Take 500 mg by mouth 2 (two) times daily.)   lubiprostone (AMITIZA) 24 MCG capsule Take 1 capsule (24 mcg total) by mouth 2 (two) times daily with a meal. (Patient not taking: Reported on 09/25/2022)   No facility-administered encounter medications on file as of 09/25/2022.    Allergies (verified) Patient has no known allergies.   History: Past Medical History:  Diagnosis Date   Allergy    Arthritis    Cataract  Glaucoma    Hypertension    Normocytic anemia 09/25/2012   Polyp, sigmoid colon 11/05/2011   Seizure (HCC)    Seizures (HCC) 03/17/2013   Past Surgical History:  Procedure Laterality Date   ANTERIOR CERVICAL DECOMP/DISCECTOMY FUSION N/A 05/22/2017   Procedure: Anterior Cervical Discectomy Fusion - Cervical Three-Cervical Four - Cervical Four-Cervical Five;  Surgeon: Donalee Citrin, MD;  Location: [facility] OR;  Service: Neurosurgery;  Laterality: N/A;   CATARACT EXTRACTION, BILATERAL     EYE SURGERY     GLAUCOMA REPAIR  10 yrs ago   TRANSURETHRAL RESECTION OF PROSTATE N/A 08/06/2022   Procedure: TRANSURETHRAL RESECTION OF THE PROSTATE (TURP)  UNROOFING PROSTATIC ABSCESS;  Surgeon: [name], MD;  Location: [facility] ORS;  Service: Urology;  Laterality: N/A;  1 HR FOR CASE   Family History  Problem Relation Age of Onset   Stroke Mother    High blood pressure Mother    Stroke Father    High blood pressure Father    Colon cancer Brother    Esophageal cancer Neg Hx    Pancreatic cancer Neg Hx    Rectal cancer Neg Hx    Stomach cancer Neg Hx    Social History   Socioeconomic History   Marital status: Married    Spouse name: Evan Escobar   Number of children: 1   Years of education: 11   Highest education level: Not on file  Occupational History   Occupation: retired  Tobacco Use   Smoking status: Former    Current packs/day: 0.00    Types: Cigarettes    Quit date: 02/18/2007    Years since quitting: 15.6   Smokeless tobacco: Never   Tobacco comments:    not sure but a while ago; quit smoking and ETOH  Vaping Use   Vaping status: Never Used  Substance and Sexual Activity   Alcohol use: No    Alcohol/week: 0.0 standard drinks of alcohol    Comment: quit in 2009   Drug use: No   Sexual activity: Not Currently  Other Topics Concern   Not on file  Social History Narrative   Patient lives at home with his wife Evan Escobar). Patient is retired. Patient has 11 th grade education.    Caffeine- one cup of coffee daily and one soda.   Right handed.   Social Determinants of Health   Financial Resource Strain: Low Risk  (09/25/2022)   Overall Financial Resource Strain (CARDIA)    Difficulty of Paying Living Expenses: Not hard at all  Food Insecurity: No Food Insecurity (09/25/2022)   Hunger Vital Sign    Worried About Running Out of Food in the Last Year: Never true    Ran Out of Food in the Last Year: Never true  Recent Concern: Food Insecurity - Food Insecurity Present (08/08/2022)   Hunger Vital Sign    Worried About Running Out of Food in the Last Year: Sometimes true    Ran Out of Food in the Last Year: Never true  Transportation  Needs: No Transportation Needs (09/25/2022)   PRAPARE - Administrator, Civil Service (Medical): No    Lack of Transportation (Non-Medical): No  Physical Activity: Inactive (09/25/2022)   Exercise Vital Sign    Days of Exercise per Week: 0 days    Minutes of Exercise per Session: 0 min  Stress: Stress Concern Present (09/25/2022)   [employer] of Occupational Health - Occupational Stress Questionnaire    Feeling of Stress : To some  extent  Social Connections: Moderately Integrated (09/25/2022)   Social Connection and Isolation Panel [NHANES]    Frequency of Communication with Friends and Family: More than three times a week    Frequency of Social Gatherings with Friends and Family: Never    Attends Religious Services: More than 4 times per year    Active Member of [employer] or Organizations: No    Attends Engineer, structural: Never    Marital Status: Married    Tobacco Counseling Counseling given: Not Answered Tobacco comments: not sure but a while ago; quit smoking and ETOH   Clinical Intake:  Pre-visit preparation completed: Yes  Pain : 0-10 Pain Score: 8  Pain Type: Acute pain Pain Location: Back Pain Descriptors / Indicators: Aching, Discomfort Pain Onset: More than a month ago Pain Frequency: Constant Pain Relieving Factors: Tylenol  Pain Relieving Factors: Tylenol  BMI - recorded: 19.13 Nutritional Risks: Nausea/ vomitting/ diarrhea (diahrrea)  How often do you need to have someone help you when you read instructions, pamphlets, or other written materials from your doctor or pharmacy?: 1 - Never  Interpreter Needed?: No  Information entered by ::  , RMA   Activities of Daily Living    09/25/2022    2:40 PM 08/04/2022   11:00 PM  In your present state of health, do you have any difficulty performing the following activities:  Hearing? 1 1  Comment Wears hearing aides   Vision? 0 0  Difficulty concentrating or making decisions? 0  0  Walking or climbing stairs? 1   Dressing or bathing? 0 0  Doing errands, shopping? 0   Preparing Food and eating ? N   Using the Toilet? N   In the past six months, have you accidently leaked urine? N   Do you have problems with loss of bowel control? N   Managing your Medications? N   Managing your Finances? N   Housekeeping or managing your Housekeeping? N     Patient Care Team: Myrlene Broker, MD as PCP - General (Internal Medicine) Levert Feinstein, MD as Consulting Physician (Neurology) Charlsie Merles Kirstie Peri, DPM as Consulting Physician (Podiatry) [place], Garry Heater, [place] (Inactive) as Pharmacist (Pharmacist) [name], MD as Consulting Physician (Urology) Colletta [place], RN as [facility] Care Management  Indicate any recent Medical Services you may have received from other than [facility] providers in the past year (date may be approximate).     Assessment:   This is a routine wellness examination for [place].  Hearing/Vision screen Hearing Screening - Comments:: Wears hearing aides Vision Screening - Comments:: Wears eyeglasses  Dietary issues and exercise activities discussed:     Goals Addressed   None   Depression Screen    09/25/2022    2:48 PM 08/13/2022    9:53 AM 08/04/2022    8:18 AM 05/12/2022    9:44 AM 04/07/2022   11:06 AM 02/03/2022    8:06 AM 10/02/2021    8:08 AM  PHQ 2/9 Scores  PHQ - 2 Score 1 0 0 0 0 0 1  PHQ- 9 Score 2 0 0  0 0 3    Fall Risk    09/25/2022    2:45 PM 08/13/2022    9:53 AM 08/04/2022    8:18 AM 05/12/2022    9:44 AM 04/07/2022   11:06 AM  Fall Risk   Falls in the past year? 0 0 0 0 0  Number falls in past yr: 0 0 0  0 0  Injury with Fall? 0 0 0 0 0  Risk for fall due to : No Fall Risks   No Fall Risks   Follow up Falls prevention discussed Falls evaluation completed Falls evaluation completed Falls evaluation completed Falls evaluation completed    MEDICARE RISK AT HOME:  Medicare Risk at Home - 09/25/22 1445      Any stairs in or around the home? No    Home free of loose throw rugs in walkways, pet beds, electrical cords, etc? Yes    Adequate lighting in your home to reduce risk of falls? Yes    Life alert? No    Use of a cane, walker or w/c? Yes    Grab bars in the bathroom? No    Shower chair or bench in shower? No    Elevated toilet seat or a handicapped toilet? No             TIMED UP AND GO:  Was the test performed?  No    Cognitive Function:    07/25/2016    9:45 AM 08/17/2014    8:49 AM  MMSE - Mini Mental State Exam  Not completed:  Unable to complete  Orientation to time 5   Orientation to Place 5   Registration 3   Attention/ Calculation 5   Recall 3   Language- name 2 objects 2   Language- repeat 1   Language- follow 3 step command 3   Language- read & follow direction 1   Write a sentence 1   Copy design 1   Total score 30         09/25/2022    2:45 PM  6CIT Screen  What Year? 0 points  What month? 0 points  What time? 0 points  Count back from 20 0 points  Months in reverse 4 points  Repeat phrase 6 points  Total Score 10 points    Immunizations Immunization History  Administered Date(s) Administered   COVID-19, mRNA, vaccine(Comirnaty)12 years and older 11/25/2021   Fluad Quad(high Dose 65+) 12/17/2018, 11/15/2019, 10/30/2020, 11/25/2021   Influenza, High Dose Seasonal PF 11/17/2012, 11/18/2016, 12/09/2017   Influenza,inj,Quad PF,6+ Mos 11/14/2013, 01/01/2016   Influenza-Unspecified 01/18/2015, 01/04/2017, 12/02/2018, 11/25/2021   PFIZER(Purple Top)SARS-COV-2 Vaccination 04/15/2019, 05/10/2019   Pfizer Covid-19 Vaccine Bivalent Booster 4yrs & up 11/25/2021   Pneumococcal Conjugate-13 04/16/2015   Pneumococcal Polysaccharide-23 11/15/2019   Pneumococcal-Unspecified 02/17/2010   Tdap 08/17/2014    TDAP status: Up to date  Flu Vaccine status: Up to date  Pneumococcal vaccine status: Up to date  Covid-19 vaccine status: Information provided  on how to obtain vaccines.   Qualifies for Shingles Vaccine? Yes   Zostavax completed No   Shingrix Completed?: No.    Education has been provided regarding the importance of this vaccine. Patient has been advised to call insurance company to determine out of pocket expense if they have not yet received this vaccine. Advised may also receive vaccine at local pharmacy or Health Dept. Verbalized acceptance and understanding.  Screening Tests Health Maintenance  Topic Date Due   Zoster Vaccines- Shingrix (1 of 2) Never done   COVID-19 Vaccine (3 - 2023-24 season) 03/28/2022   INFLUENZA VACCINE  09/18/2022   Medicare Annual Wellness (AWV)  09/25/2023   DTaP/Tdap/Td (2 - Td or Tdap) 08/16/2024   Pneumonia Vaccine 9+ Years old  Completed   HPV VACCINES  Aged Out    Health Maintenance  Health Maintenance Due  Topic Date  Due   Zoster Vaccines- Shingrix (1 of 2) Never done   COVID-19 Vaccine (3 - 2023-24 season) 03/28/2022   INFLUENZA VACCINE  09/18/2022    Colorectal cancer screening: No longer required.   Lung Cancer Screening: (Low Dose CT Chest recommended if Age 80-80 years, 20 pack-year currently smoking OR have quit w/in 15years.) does qualify.   Lung Cancer Screening Referral: 09/25/2022  Additional Screening:  Hepatitis C Screening: does not qualify;   Vision Screening: Recommended annual ophthalmology exams for early detection of glaucoma and other disorders of the eye. Is the patient up to date with their annual eye exam?  Yes  Who is the provider or what is the name of the office in which the patient attends annual eye exams? Dr. Harlon Flor If pt is not established with a provider, would they like to be referred to a provider to establish care? No .   Dental Screening: Recommended annual dental exams for proper oral hygiene   Community Resource Referral / Chronic Care Management: CRR required this visit?  No   CCM required this visit?  No     Plan:     I have  personally reviewed and noted the following in the patient's chart:   Medical and social history Use of alcohol, tobacco or illicit drugs  Current medications and supplements including opioid prescriptions. Patient is not currently taking opioid prescriptions. Functional ability and status Nutritional status Physical activity Advanced directives List of other physicians Hospitalizations, surgeries, and ER visits in previous 12 months Vitals Screenings to include cognitive, depression, and falls Referrals and appointments  In addition, I have reviewed and discussed with patient certain preventive protocols, quality metrics, and best practice recommendations. A written personalized care plan for preventive services as well as general preventive health recommendations were provided to patient.      L , CMA   09/25/2022   After Visit Summary: (MyChart) Due to this being a telephonic visit, the after visit summary with patients personalized plan was offered to patient via MyChart   Nurse Notes: Patient has complainants about the constipation.  Patient stated that nothing given has worked efficiently.  He is wanting some other recommendation to help with this issue. Also patient is interested in getting a Lung cancer screening.  A referral has been place at [facility] today. Patient will call the office to schedule for next year's Medicare AWV.

## 2022-10-02 ENCOUNTER — Ambulatory Visit: Payer: Self-pay

## 2022-10-02 NOTE — Patient Instructions (Signed)
 Visit Information  Thank you for taking time to visit with me today. Please don't hesitate to contact me if I can be of assistance to you.   Following are the goals we discussed today:  Attend provider visits as scheduled Take medications as prescribed Contact provider with health questions or concerns as needed   If you are experiencing a Mental Health or Behavioral Health Crisis or need someone to talk to, please call the Suicide and Crisis Lifeline: 988 call the [place] National Suicide Prevention Lifeline: (660)344-1847 or TTY: (513)416-7014 TTY 629-355-0116) to talk to a trained counselor call 1-800-273-TALK (toll free, 24 hour hotline)  Kathyrn Sheriff, RN, MSN, BSN, CCM [facility] Care Coordinator [phone]

## 2022-10-02 NOTE — Patient Outreach (Signed)
  Care Coordination   Follow Up Visit Note   10/02/2022 Name: Evan Escobar MRN: [id number] DOB: 02/11/1941  Evan Escobar is a 82 y.o. year old male who sees Myrlene Broker, MD for primary care. I spoke with  [name] by phone today.  What matters to the patients health and wellness today?  Mr. Cassani reports he is doing much better, he reports swelling in prostate area is gone. He reports he still has a foley and is scheduled to follow up with urologist on Wednesday. Annual wellness visit completed on 09/25/22. He denies any constipation at this time. Denies any care coordination needs at this time. Patient to contact RNCM if care coordination needs in the future. Contact number provided.  Goals Addressed             This Visit's Progress    COMPLETED: Assist with health managment-continue to improve post hospitalization       Interventions Today    Flowsheet Row Most Recent Value  Chronic Disease   Chronic disease during today's visit Other  General Interventions   General Interventions Discussed/Reviewed General Interventions Reviewed, Doctor Visits  [annual wellness visit completed 09/25/22]  Doctor Visits Discussed/Reviewed Doctor Visits Discussed  Education Interventions   Education Provided Provided Education  [advised to contact provider with health questions or concerns as needed]  Provided Verbal Education On When to see the doctor, Medication  [advised to continue to take medications as prescribed, attend provider visits as schedule, discussed constipation and encouraged to take prescribed medications as needed to help avoid constipation.]  Pharmacy Interventions   Pharmacy Dicussed/Reviewed Pharmacy Topics Reviewed            SDOH assessments and interventions completed:  No  Care Coordination Interventions:  Yes, provided   Follow up plan: No further intervention required.   Encounter Outcome:  Pt. Visit Completed   Kathyrn Sheriff, RN, MSN, BSN,  CCM [facility] Care Coordinator [phone]

## 2022-10-09 DIAGNOSIS — R338 Other retention of urine: Secondary | ICD-10-CM | POA: Diagnosis not present

## 2022-10-14 DIAGNOSIS — H16223 Keratoconjunctivitis sicca, not specified as Sjogren's, bilateral: Secondary | ICD-10-CM | POA: Diagnosis not present

## 2022-10-14 DIAGNOSIS — H401133 Primary open-angle glaucoma, bilateral, severe stage: Secondary | ICD-10-CM | POA: Diagnosis not present

## 2022-10-14 DIAGNOSIS — H348312 Tributary (branch) retinal vein occlusion, right eye, stable: Secondary | ICD-10-CM | POA: Diagnosis not present

## 2022-10-14 DIAGNOSIS — H1131 Conjunctival hemorrhage, right eye: Secondary | ICD-10-CM | POA: Diagnosis not present

## 2022-10-14 DIAGNOSIS — H3562 Retinal hemorrhage, left eye: Secondary | ICD-10-CM | POA: Diagnosis not present

## 2022-10-16 ENCOUNTER — Ambulatory Visit: Payer: Medicare Other | Admitting: Podiatry

## 2022-10-16 ENCOUNTER — Encounter: Payer: Self-pay | Admitting: Podiatry

## 2022-10-16 DIAGNOSIS — M2042 Other hammer toe(s) (acquired), left foot: Secondary | ICD-10-CM | POA: Diagnosis not present

## 2022-10-16 DIAGNOSIS — L84 Corns and callosities: Secondary | ICD-10-CM

## 2022-10-16 DIAGNOSIS — M21619 Bunion of unspecified foot: Secondary | ICD-10-CM

## 2022-10-16 NOTE — Progress Notes (Signed)
Subjective:   Patient ID: Evan Escobar, male   DOB: 82 y.o.   MRN: [id number]   HPI Patient presents with digital deformity and chronic lesion with structural deformity of the left foot stating is been very painful.  Presents with caregiver and states this has been more recent   ROS      Objective:  Physical Exam  Neurovascular status unchanged with a very painful lesion second digit left foot that is dystrophic and hard for her to wear shoe gear with.  Patient has pressure between the hallux and second toe that is creating this pathology     Assessment:  Combination of structural deformity along with lesion from pressure     Plan:  H&P reviewed with patient.  At this point debridement accomplished and this was done courtesy I discussed structural deformity digit and big toe and we could consider if we had to some form of surgical procedure but I do not recommend that unless absolutely necessary.  Patient will be seen back to recheck

## 2022-10-20 ENCOUNTER — Encounter (HOSPITAL_COMMUNITY): Payer: Self-pay

## 2022-10-20 ENCOUNTER — Other Ambulatory Visit: Payer: Self-pay

## 2022-10-20 ENCOUNTER — Emergency Department (HOSPITAL_COMMUNITY)
Admission: EM | Admit: 2022-10-20 | Discharge: 2022-10-20 | Disposition: A | Discharge status: Home / Self Care | Payer: Medicare Other | Attending: Emergency Medicine | Admitting: Emergency Medicine

## 2022-10-20 DIAGNOSIS — R339 Retention of urine, unspecified: Secondary | ICD-10-CM | POA: Insufficient documentation

## 2022-10-20 DIAGNOSIS — R319 Hematuria, unspecified: Secondary | ICD-10-CM | POA: Insufficient documentation

## 2022-10-20 LAB — URINALYSIS, W/ REFLEX TO CULTURE (INFECTION SUSPECTED)
Bilirubin Urine: NEGATIVE
Glucose, UA: NEGATIVE mg/dL
Ketones, ur: NEGATIVE mg/dL
Nitrite: NEGATIVE
Protein, ur: 30 mg/dL — AB
Specific Gravity, Urine: 1.003 — ABNORMAL LOW (ref 1.005–1.030)
WBC, UA: >50 WBC/hpf (ref 0–5)
pH: 8 (ref 5.0–8.0)

## 2022-10-20 LAB — CBC
HCT: 27.3 % — ABNORMAL LOW (ref 39.0–52.0)
Hemoglobin: 9 g/dL — ABNORMAL LOW (ref 13.0–17.0)
MCH: 28.4 pg (ref 26.0–34.0)
MCHC: 33 g/dL (ref 30.0–36.0)
MCV: 86.1 fL (ref 80.0–100.0)
Platelets: 152 10*3/uL (ref 150–400)
RBC: 3.17 MIL/uL — ABNORMAL LOW (ref 4.22–5.81)
RDW: 14.3 % (ref 11.5–15.5)
WBC: 3.6 10*3/uL — ABNORMAL LOW (ref 4.0–10.5)
nRBC: 0 % (ref 0.0–0.2)

## 2022-10-20 LAB — BASIC METABOLIC PANEL
Anion gap: 9 (ref 5–15)
BUN: 15 mg/dL (ref 8–23)
CO2: 22 mmol/L (ref 22–32)
Calcium: 8.9 mg/dL (ref 8.9–10.3)
Chloride: 90 mmol/L — ABNORMAL LOW (ref 98–111)
Creatinine, Ser: 1.03 mg/dL (ref 0.61–1.24)
GFR, Estimated: >60 mL/min (ref >60)
Glucose, Bld: 82 mg/dL (ref 70–99)
Potassium: 4.2 mmol/L (ref 3.5–5.1)
Sodium: 121 mmol/L — ABNORMAL LOW (ref 135–145)

## 2022-10-20 NOTE — ED Provider Notes (Signed)
[place] EMERGENCY DEPARTMENT AT [facility] Provider Note   CSN: [id number] Arrival date & time: 10/20/22  1423     History Chief Complaint  Patient presents with   Hematuria    HPI Evan Escobar is a 82 y.o. male presenting for chief complaint of hematuria.  States that he has had a Foley catheter in due to urinary retention has not been changed in approximately 3 weeks. Today while he was having issues with constipation, he noted discoloration of his urine.  He also feels that not as much urine is draining as historically it has. Denies fevers chills nausea vomiting shortness of breath.  Does have some discomfort at his urethral meatus..   Patient's recorded medical, surgical, social, medication list and allergies were reviewed in the Snapshot window as part of the initial history.   Review of Systems   Review of Systems  Constitutional:  Negative for chills and fever.  HENT:  Negative for ear pain and sore throat.   Eyes:  Negative for pain and visual disturbance.  Respiratory:  Negative for cough and shortness of breath.   Cardiovascular:  Negative for chest pain and palpitations.  Gastrointestinal:  Negative for abdominal pain and vomiting.  Genitourinary:  Positive for difficulty urinating, dysuria and hematuria.  Musculoskeletal:  Negative for arthralgias and back pain.  Skin:  Negative for color change and rash.  Neurological:  Negative for seizures and syncope.  All other systems reviewed and are negative.   Physical Exam Updated Vital Signs BP (!) 164/70   Pulse (!) 59   Temp 98 F (36.7 C) (Oral)   Resp 17   Ht 5\' 7"  (1.702 m)   Wt 63.5 kg   SpO2 100%   BMI 21.93 kg/m  Physical Exam Vitals and nursing note reviewed.  Constitutional:      General: He is not in acute distress.    Appearance: He is well-developed.  HENT:     Head: Normocephalic and atraumatic.  Eyes:     Conjunctiva/sclera: Conjunctivae normal.  Cardiovascular:     Rate  and Rhythm: Normal rate and regular rhythm.     Heart sounds: No murmur heard. Pulmonary:     Effort: Pulmonary effort is normal. No respiratory distress.     Breath sounds: Normal breath sounds.  Abdominal:     Palpations: Abdomen is soft.     Tenderness: There is no abdominal tenderness.  Musculoskeletal:        General: No swelling.     Cervical back: Neck supple.  Skin:    General: Skin is warm and dry.     Capillary Refill: Capillary refill takes less than 2 seconds.  Neurological:     Mental Status: He is alert.  Psychiatric:        Mood and Affect: Mood normal.      ED Course/ Medical Decision Making/ A&P    Procedures Procedures   Medications Ordered in ED Medications - No data to display  Medical Decision Making:    Evan Escobar is a 82 y.o. male who presented to the ED today with urinary discomfort detailed above.     Complete initial physical exam performed, notably the patient  was hemodynamically stable no acute distress.      Reviewed and confirmed nursing documentation for past medical history, family history, social history.    Initial Assessment:   With the patient's presentation of hematuria, most likely diagnosis is infectious versus benign etiology possibly trauma from indwelling  Foley. Other diagnoses were considered including (but not limited to) obstruction, retention, pyelonephritis, nephrolithiasis. These are considered less likely due to history of present illness and physical exam findings.   This is most consistent with an acute life/limb threatening illness complicated by underlying chronic conditions.  Initial Plan:  Will replace Foley catheter and get a clean-catch urinalysis with culture from fresh catheterization to evaluate for infection Screening labs including CBC and Metabolic panel to evaluate for infectious or metabolic etiology of disease.  Objective evaluation as below reviewed with plan for close reassessment  Initial Study  Results:   Laboratory  All laboratory results reviewed without evidence of clinically relevant pathology.     Reassessment and Plan:   Patient well-appearing.  No evidence of's severe infection.  Will follow-up urine culture for determination of antibiosis.  Urine is now draining clear favor likely irritation from his Foley that was causing the hematuria though infection is still on the differential.  Will follow-up asynchronous urine culture with pharmacy protocol. Strict return precautions regarding interval worsening reinforced.   Disposition:  I have considered need for hospitalization, however, considering all of the above, I believe this patient is stable for discharge at this time.  Patient/family educated about specific return precautions for given chief complaint and symptoms.  Patient/family educated about follow-up with PCP.     Patient/family expressed understanding of return precautions and need for follow-up. Patient spoken to regarding all imaging and laboratory results and appropriate follow up for these results. All education provided in verbal form with additional information in written form. Time was allowed for answering of patient questions. Patient discharged.    Emergency Department Medication Summary:   Medications - No data to display   Clinical Impression:  1. Urinary retention      Discharge   Final Clinical Impression(s) / ED Diagnoses Final diagnoses:  Urinary retention    Rx / DC Orders ED Discharge Orders     None         Glyn Ade, MD 10/20/22 1902

## 2022-10-20 NOTE — ED Triage Notes (Signed)
Pt c.o hematuria since this morning, foley catheter in place for about a month due to retention. Denies abd pain or urethral bleeding. Pt states he was constipated and was straining this morning and does not know if that is related

## 2022-10-21 LAB — URINE CULTURE: Culture: <10000 — AB

## 2022-10-29 DIAGNOSIS — R338 Other retention of urine: Secondary | ICD-10-CM | POA: Diagnosis not present

## 2022-10-31 DIAGNOSIS — R338 Other retention of urine: Secondary | ICD-10-CM | POA: Diagnosis not present

## 2022-11-03 ENCOUNTER — Other Ambulatory Visit: Payer: Self-pay | Admitting: *Deleted

## 2022-11-03 DIAGNOSIS — Z125 Encounter for screening for malignant neoplasm of prostate: Secondary | ICD-10-CM

## 2022-11-04 ENCOUNTER — Telehealth: Payer: Self-pay

## 2022-11-04 NOTE — Telephone Encounter (Signed)
Transition Care Management Follow-up Telephone Call Date of discharge and from where: 10/20/2022 [facility] How have you been since you were released from the hospital? [name]. Any questions or concerns? No  Items Reviewed: Did the pt receive and understand the discharge instructions provided? Yes  Medications obtained and verified?  No medication prescribed. Other? No  Any new allergies since your discharge? No  Dietary orders reviewed? Yes Do you have support at home? Yes   Follow up appointments reviewed:  PCP Hospital f/u appt confirmed? No  Scheduled to see  on  @ . Specialist Hospital f/u appt confirmed? Yes  Scheduled to see Evan Escobar, PRN on 11/03/2022 @ [facility]. Are transportation arrangements needed? No  If their condition worsens, is the pt aware to call PCP or go to the Emergency Dept.? Yes Was the patient provided with contact information for the PCP's office or ED? Yes Was to pt encouraged to call back with questions or concerns? Yes  Evan [facility]obar [facility]  [facility], [facility] Guide Direct Dial: [phone]  Website: [email or web address]

## 2022-11-04 NOTE — Progress Notes (Signed)
Patient: Evan Escobar           Date of Birth: 10-17-1940           MRN: [id number] Visit Date: 11/03/2022 PCP: Myrlene Broker, MD  Prostate Cancer Screening Date of last physical exam:  (3 weeks ago) Have you ever had any of the following?: None Have you ever had or been told you have an allergy to latex products?: No Are you currently taking any natural prostate preparations?: No Are you currently experiencing any urinary symptoms?: No  Prostate Exam Exam not completed. PSA blood test completed only.  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   Encounter for general adult medical examination with abnormal findings 08/28/2015   Allergic rhinitis 02/26/2015   Glaucoma 05/07/2013   Essential hypertension 05/07/2013   Seizures (HCC) 03/17/2013   Past Medical History:  Diagnosis Date   Allergy    Arthritis    Cataract    Glaucoma    Hypertension    Normocytic anemia 09/25/2012   Polyp, sigmoid colon 11/05/2011   Seizure (HCC)    Seizures (HCC) 03/17/2013    Family History  Problem Relation Age of Onset   Stroke Mother    High blood pressure Mother    Stroke Father    High blood pressure Father    Colon cancer Brother    Esophageal cancer Neg Hx    Pancreatic cancer Neg Hx    Rectal cancer Neg Hx    Stomach cancer Neg Hx     Social History   Occupational History   Occupation: retired   Tobacco Use   Smoking status: Former    Current packs/day: 0.00    Types: Cigarettes    Quit date: 02/18/2007    Years since quitting: 15.7   Smokeless tobacco: Never   Tobacco comments:    not sure but a while ago; quit smoking and ETOH  Vaping Use   Vaping status: Never Used  Substance and Sexual Activity   Alcohol use: No    Alcohol/week: 0.0 standard drinks of alcohol    Comment: quit in 2009   Drug use: No   Sexual activity: Not Currently

## 2022-11-07 ENCOUNTER — Telehealth: Payer: Self-pay

## 2022-11-07 NOTE — Telephone Encounter (Signed)
Patient informed PSA results, elevated at 3.7, normal range (>81 yrs old) is less than 3.0. Patient states he currently sees Dr. Annabell Howells @ [facility], has appointment next week to have catheter removed, requests results to be sent to Dr. Annabell Howells. Results sent to Dr. Annabell Howells.

## 2022-11-13 DIAGNOSIS — R338 Other retention of urine: Secondary | ICD-10-CM | POA: Diagnosis not present

## 2022-11-14 ENCOUNTER — Other Ambulatory Visit: Payer: Self-pay

## 2022-11-14 ENCOUNTER — Emergency Department (HOSPITAL_COMMUNITY)
Admission: EM | Admit: 2022-11-14 | Discharge: 2022-11-15 | Disposition: A | Discharge status: Home / Self Care | Payer: Medicare Other | Attending: Emergency Medicine | Admitting: Emergency Medicine

## 2022-11-14 ENCOUNTER — Encounter (HOSPITAL_COMMUNITY): Payer: Self-pay | Admitting: *Deleted

## 2022-11-14 DIAGNOSIS — Z87891 Personal history of nicotine dependence: Secondary | ICD-10-CM | POA: Diagnosis not present

## 2022-11-14 DIAGNOSIS — R3981 Functional urinary incontinence: Secondary | ICD-10-CM | POA: Diagnosis present

## 2022-11-14 DIAGNOSIS — K5900 Constipation, unspecified: Secondary | ICD-10-CM | POA: Diagnosis not present

## 2022-11-14 DIAGNOSIS — R32 Unspecified urinary incontinence: Secondary | ICD-10-CM

## 2022-11-14 DIAGNOSIS — I10 Essential (primary) hypertension: Secondary | ICD-10-CM | POA: Diagnosis not present

## 2022-11-14 NOTE — ED Triage Notes (Signed)
The pt had  a catheter placed one month ago  the catheter was removed yesterday.  Now the pt wants the catheter placed again because he is voiding all the time since it was removed

## 2022-11-14 NOTE — ED Notes (Signed)
Pt unable to void at this time. 

## 2022-11-15 LAB — COMPREHENSIVE METABOLIC PANEL
ALT: 12 U/L (ref 0–44)
AST: 19 U/L (ref 15–41)
Albumin: 3.7 g/dL (ref 3.5–5.0)
Alkaline Phosphatase: 27 U/L — ABNORMAL LOW (ref 38–126)
Anion gap: 11 (ref 5–15)
BUN: 12 mg/dL (ref 8–23)
CO2: 23 mmol/L (ref 22–32)
Calcium: 9.2 mg/dL (ref 8.9–10.3)
Chloride: 96 mmol/L — ABNORMAL LOW (ref 98–111)
Creatinine, Ser: 1.06 mg/dL (ref 0.61–1.24)
GFR, Estimated: >60 mL/min (ref >60)
Glucose, Bld: 92 mg/dL (ref 70–99)
Potassium: 4.7 mmol/L (ref 3.5–5.1)
Sodium: 130 mmol/L — ABNORMAL LOW (ref 135–145)
Total Bilirubin: 0.7 mg/dL (ref 0.3–1.2)
Total Protein: 6.8 g/dL (ref 6.5–8.1)

## 2022-11-15 LAB — URINALYSIS, ROUTINE W REFLEX MICROSCOPIC
Bilirubin Urine: NEGATIVE
Glucose, UA: NEGATIVE mg/dL
Ketones, ur: NEGATIVE mg/dL
Nitrite: NEGATIVE
Protein, ur: NEGATIVE mg/dL
Specific Gravity, Urine: 1.004 — ABNORMAL LOW (ref 1.005–1.030)
WBC, UA: >50 WBC/hpf (ref 0–5)
pH: 8 (ref 5.0–8.0)

## 2022-11-15 LAB — CBC
HCT: 28.8 % — ABNORMAL LOW (ref 39.0–52.0)
Hemoglobin: 9.6 g/dL — ABNORMAL LOW (ref 13.0–17.0)
MCH: 27.9 pg (ref 26.0–34.0)
MCHC: 33.3 g/dL (ref 30.0–36.0)
MCV: 83.7 fL (ref 80.0–100.0)
Platelets: 152 10*3/uL (ref 150–400)
RBC: 3.44 MIL/uL — ABNORMAL LOW (ref 4.22–5.81)
RDW: 14.5 % (ref 11.5–15.5)
WBC: 9.3 10*3/uL (ref 4.0–10.5)
nRBC: 0 % (ref 0.0–0.2)

## 2022-11-15 LAB — LIPASE, BLOOD: Lipase: 19 U/L (ref 11–51)

## 2022-11-15 MED ORDER — BISACODYL 10 MG RE SUPP: 10.0000 mg | RECTAL | 0 refills | Status: DC | PRN

## 2022-11-15 MED ORDER — POLYETHYLENE GLYCOL 3350 17 G PO PACK: 17.0000 g | PACK | Freq: Every day | ORAL | 1 refills | Status: AC

## 2022-11-15 NOTE — ED Provider Notes (Signed)
[facility]PT [facility] Emergency Department Provider Note MRN:  [id number]  Arrival date & time: 11/15/22     Chief Complaint   Urination issue History of Present Illness   Evan Escobar is a 82 y.o. year-old male with a history of prostate resection presenting to the ED with chief complaint of urination issue.  Patient explains that he has had issues with his prostate and urinary retention required surgery in the past.  He has had catheters placed for retention multiple times, had one placed a month ago and it was removed yesterday.  He is having a hard time controlling his urination since then.  He does not strain, the urine comes out freely but he is having incontinence, not able to get to the bathroom in time.  Becoming frustrated, wants the catheter back in.  Also having some constipation and wondering if that is contributing.  Review of Systems  A thorough review of systems was obtained and all systems are negative except as noted in the HPI and PMH.   Patient's Health History    Past Medical History:  Diagnosis Date   Allergy    Arthritis    Cataract    Glaucoma    Hypertension    Normocytic anemia 09/25/2012   Polyp, sigmoid colon 11/05/2011   Seizure (HCC)    Seizures (HCC) 03/17/2013    Past Surgical History:  Procedure Laterality Date   ANTERIOR CERVICAL DECOMP/DISCECTOMY FUSION N/A 05/22/2017   Procedure: Anterior Cervical Discectomy Fusion - Cervical Three-Cervical Four - Cervical Four-Cervical Five;  Surgeon: Donalee Citrin, MD;  Location: [facility] OR;  Service: Neurosurgery;  Laterality: N/A;   CATARACT EXTRACTION, BILATERAL     EYE SURGERY     GLAUCOMA REPAIR  10 yrs ago   TRANSURETHRAL RESECTION OF PROSTATE N/A 08/06/2022   Procedure: TRANSURETHRAL RESECTION OF THE PROSTATE (TURP) UNROOFING PROSTATIC ABSCESS;  Surgeon: [name], MD;  Location: [facility] ORS;  Service: Urology;  Laterality: N/A;  1 HR FOR CASE    Family History  Problem Relation Age of Onset    Stroke Mother    High blood pressure Mother    Stroke Father    High blood pressure Father    Colon cancer Brother    Esophageal cancer Neg Hx    Pancreatic cancer Neg Hx    Rectal cancer Neg Hx    Stomach cancer Neg Hx     Social History   Socioeconomic History   Marital status: Married    Spouse name: Kara Mead   Number of children: 1   Years of education: 11   Highest education level: Not on file  Occupational History   Occupation: retired  Tobacco Use   Smoking status: Former    Current packs/day: 0.00    Types: Cigarettes    Quit date: 02/18/2007    Years since quitting: 15.7   Smokeless tobacco: Never   Tobacco comments:    not sure but a while ago; quit smoking and ETOH  Vaping Use   Vaping status: Never Used  Substance and Sexual Activity   Alcohol use: No    Alcohol/week: 0.0 standard drinks of alcohol    Comment: quit in 2009   Drug use: No   Sexual activity: Not Currently  Other Topics Concern   Not on file  Social History Narrative   Patient lives at home with his wife Kara Mead). Patient is retired. Patient has 11 th grade education.    Caffeine- one cup of coffee daily and  one soda.   Right handed.   Social Determinants of Health   Financial Resource Strain: Low Risk  (09/25/2022)   Overall Financial Resource Strain (CARDIA)    Difficulty of Paying Living Expenses: Not hard at all  Food Insecurity: No Food Insecurity (09/25/2022)   Hunger Vital Sign    Worried About Running Out of Food in the Last Year: Never true    Ran Out of Food in the Last Year: Never true  Recent Concern: Food Insecurity - Food Insecurity Present (08/08/2022)   Hunger Vital Sign    Worried About Running Out of Food in the Last Year: Sometimes true    Ran Out of Food in the Last Year: Never true  Transportation Needs: No Transportation Needs (09/25/2022)   PRAPARE - Administrator, Civil Service (Medical): No    Lack of Transportation (Non-Medical): No  Physical Activity:  Inactive (09/25/2022)   Exercise Vital Sign    Days of Exercise per Week: 0 days    Minutes of Exercise per Session: 0 min  Stress: Stress Concern Present (09/25/2022)   [employer] of Occupational Health - Occupational Stress Questionnaire    Feeling of Stress : To some extent  Social Connections: Moderately Integrated (09/25/2022)   Social Connection and Isolation Panel [NHANES]    Frequency of Communication with Friends and Family: More than three times a week    Frequency of Social Gatherings with Friends and Family: Never    Attends Religious Services: More than 4 times per year    Active Member of [employer] or Organizations: No    Attends Banker Meetings: Never    Marital Status: Married  Catering manager Violence: Not At Risk (09/25/2022)   Humiliation, Afraid, Rape, and Kick questionnaire    Fear of Current or Ex-Partner: No    Emotionally Abused: No    Physically Abused: No    Sexually Abused: No     Physical Exam   Vitals:   11/14/22 2340  BP: 135/63  Pulse: 79  Resp: 18  Temp: 97.9 F (36.6 C)  SpO2: 99%    CONSTITUTIONAL: Well-appearing, NAD NEURO/PSYCH:  Alert and oriented x 3, no focal deficits EYES:  eyes equal and reactive ENT/NECK:  no LAD, no JVD CARDIO: Regular rate, well-perfused, normal S1 and S2 PULM:  CTAB no wheezing or rhonchi GI/GU:  non-distended, non-tender MSK/SPINE:  No gross deformities, no edema SKIN:  no rash, atraumatic   *Additional and/or pertinent findings included in MDM below  Diagnostic and Interventional Summary    EKG Interpretation Date/Time:    Ventricular Rate:    PR Interval:    QRS Duration:    QT Interval:    QTC Calculation:   R Axis:      Text Interpretation:         Labs Reviewed  COMPREHENSIVE METABOLIC PANEL - Abnormal; Notable for the following components:      Result Value   Sodium 130 (*)    Chloride 96 (*)    Alkaline Phosphatase 27 (*)    All other components within normal limits   CBC - Abnormal; Notable for the following components:   RBC 3.44 (*)    Hemoglobin 9.6 (*)    HCT 28.8 (*)    All other components within normal limits  URINALYSIS, ROUTINE W REFLEX MICROSCOPIC - Abnormal; Notable for the following components:   APPearance HAZY (*)    Specific Gravity, Urine 1.004 (*)    Hgb urine  dipstick MODERATE (*)    Leukocytes,Ua LARGE (*)    Bacteria, UA FEW (*)    All other components within normal limits  URINE CULTURE  LIPASE, BLOOD    No orders to display    Medications - No data to display   Procedures  /  Critical Care Procedures  ED Course and Medical Decision Making  Initial Impression and Ddx Urinary incontinence, mild constipation.  Vitals normal, no pain, nontender abdomen.  We discussed management options.  Seems more reasonable to wait a few days to see if his body will adjust to not having the catheter anymore.  Would ideally avoid needing an indwelling catheter, avoiding the risk of infection and excetra.  We discussed may be not drinking as much water in the afternoon so that he does not have to get up as often at night, we discussed wearing some absorptive briefs and overall not being so hard on himself for the next few days to see if his body can adjust.  Also will treat his constipation to see if this helps.  Patient agreeable with this plan.  Past medical/surgical history that increases complexity of ED encounter: Prostatectomy  Interpretation of Diagnostics I personally reviewed the laboratory assessment and my interpretation is as follows: No significant blood count or electrolyte disturbance  Urinalysis leukoesterase positive, white blood cells, similar to prior urinalysis and culture was negative at that time, will send culture again  Patient Reassessment and Ultimate Disposition/Management     Discharge  Patient management required discussion with the following services or consulting groups:  None  Complexity of Problems  Addressed Acute illness or injury that poses threat of life of bodily function  Additional Data Reviewed and Analyzed Further history obtained from: Further history from spouse/family member  Additional Factors Impacting ED Encounter Risk Prescriptions  Elmer Sow. Pilar Plate, MD [facility] [email or web address]  Final Clinical Impressions(s) / ED Diagnoses     ICD-10-CM   1. Urinary incontinence, unspecified type  R32     2. Constipation, unspecified constipation type  K59.00       ED Discharge Orders          Ordered    bisacodyl (DULCOLAX) 10 MG suppository  As needed        11/15/22 0211    polyethylene glycol (MIRALAX) 17 g packet  Daily        11/15/22 0211             Discharge Instructions Discussed with and Provided to Patient:    Discharge Instructions      You were evaluated in the Emergency Department and after careful evaluation, we did not find any emergent condition requiring admission or further testing in the hospital.  Your exam/testing today is overall reassuring.  Try to give yourself a few more days before considering the catheter.  Use the MiraLAX up to 4 times daily to help with your constipation.  Can also use the suppositories as needed.  Can follow-up with your urologist if you continue to have issues.  Please return to the Emergency Department if you experience any worsening of your condition.   Thank you for allowing [place] to be a part of your care.      Sabas Sous, MD 11/15/22 [phone]

## 2022-11-15 NOTE — Discharge Instructions (Signed)
You were evaluated in the Emergency Department and after careful evaluation, we did not find any emergent condition requiring admission or further testing in the hospital.  Your exam/testing today is overall reassuring.  Try to give yourself a few more days before considering the catheter.  Use the MiraLAX up to 4 times daily to help with your constipation.  Can also use the suppositories as needed.  Can follow-up with your urologist if you continue to have issues.  Please return to the Emergency Department if you experience any worsening of your condition.   Thank you for allowing [place] to be a part of your care.

## 2022-11-17 LAB — URINE CULTURE: Culture: >=100000 — AB

## 2022-11-18 ENCOUNTER — Telehealth (HOSPITAL_BASED_OUTPATIENT_CLINIC_OR_DEPARTMENT_OTHER): Payer: Self-pay | Admitting: *Deleted

## 2022-11-18 NOTE — Telephone Encounter (Signed)
Post ED Visit - Positive Culture Follow-up  Culture report reviewed by antimicrobial stewardship pharmacist: [facility] []  Enzo Bi, Pharm.D. [x]  Celedonio Miyamoto, Pharm.D., BCPS AQ-ID []  Garvin Fila, Pharm.D., BCPS []  Georgina Pillion, Pharm.D., BCPS []  [place], [place].D., BCPS, AAHIVP []  Estella Husk, Pharm.D., BCPS, AAHIVP []  Lysle Pearl, PharmD, BCPS []  Phillips Climes, PharmD, BCPS []  Agapito Games, PharmD, BCPS []  Verlan Friends, PharmD []  Mervyn Gay, PharmD, BCPS []  Vinnie Level, PharmD  [facility] []  Len Childs, PharmD []  Greer Pickerel, PharmD []  Adalberto Cole, PharmD []  Perlie Gold, Rph []  Lonell Face) Jean Rosenthal, PharmD []  Earl Many, PharmD []  Junita Push, PharmD []  Dorna Leitz, PharmD []  Terrilee Files, PharmD []  Lynann Beaver, PharmD []  Keturah Barre, PharmD []  Loralee Pacas, PharmD []  Bernadene Person, PharmD   Positive urine culture Treated with no antibiotics per Dr. Milderd Meager, Dagmar Hait 11/18/2022, 9:09 AM

## 2022-11-18 NOTE — Progress Notes (Signed)
ED Antimicrobial Stewardship Positive Culture Follow Up   Evan Escobar is an 82 y.o. male who presented to [facility] on 11/14/2022 with a chief complaint of urinary problems   Recent Results (from the past 720 hour(s))  Urine Culture     Status: Abnormal   Collection Time: 10/20/22  4:54 PM   Specimen: Urine, Clean Catch  Result Value Ref Range Status   Specimen Description URINE, CLEAN CATCH  Final   Special Requests NONE Reflexed from [id number]  Final   Culture (A)  Final    <10,000 COLONIES/mL INSIGNIFICANT GROWTH Performed at [facility], [address] [place], [place] [address]    Report Status 10/21/2022 FINAL  Final  Urine Culture     Status: Abnormal   Collection Time: 11/14/22 11:48 PM   Specimen: Urine, Clean Catch  Result Value Ref Range Status   Specimen Description URINE, CLEAN CATCH  Final   Special Requests   Final    NONE Performed at [facility], [address], [place], [place] [address]    Culture >=100,000 COLONIES/mL PSEUDOMONAS AERUGINOSA (A)  Final   Report Status 11/17/2022 FINAL  Final   Organism ID, Bacteria PSEUDOMONAS AERUGINOSA (A)  Final      Susceptibility   Pseudomonas aeruginosa - MIC*    CEFTAZIDIME 4 SENSITIVE Sensitive     CIPROFLOXACIN >=4 RESISTANT Resistant     GENTAMICIN 8 INTERMEDIATE Intermediate     IMIPENEM 1 SENSITIVE Sensitive     PIP/TAZO 8 SENSITIVE Sensitive     * >=100,000 COLONIES/mL PSEUDOMONAS AERUGINOSA    Patient had foley catheter removed and requested replacement.  Plan was to not replace catheter immediately and see if the problem resolves over time.  No signs of infection noted  New antibiotic prescription: None  ED Provider: Benjiman Core, MD   Evan Escobar 11/18/2022, 8:06 AM Clinical Pharmacist Monday - Friday phone -  [phone] Saturday - Sunday phone - [phone]

## 2022-11-21 DIAGNOSIS — R3914 Feeling of incomplete bladder emptying: Secondary | ICD-10-CM | POA: Diagnosis not present

## 2022-11-21 DIAGNOSIS — R8271 Bacteriuria: Secondary | ICD-10-CM | POA: Diagnosis not present

## 2022-11-21 DIAGNOSIS — M5416 Radiculopathy, lumbar region: Secondary | ICD-10-CM | POA: Diagnosis not present

## 2022-12-17 DIAGNOSIS — R8271 Bacteriuria: Secondary | ICD-10-CM | POA: Diagnosis not present

## 2022-12-17 DIAGNOSIS — R3914 Feeling of incomplete bladder emptying: Secondary | ICD-10-CM | POA: Diagnosis not present

## 2022-12-22 ENCOUNTER — Telehealth: Payer: Self-pay | Admitting: *Deleted

## 2022-12-22 NOTE — Telephone Encounter (Signed)
Transition Care Management Follow-up Telephone Call Date of discharge and from where: [facility]  11/15/2022 How have you been since you were released from the hospital? Feeling good  Any questions or concerns? No  Items Reviewed: Did the pt receive and understand the discharge instructions provided? Yes  Medications obtained and verified? Yes  Other? No  Any new allergies since your discharge? No  Dietary orders reviewed? No Do you have support at home? YES      PCP Hospital f/u appt confirmed?  May see him later but no appt scheduled now        Are transportation arrangements needed? No  If their condition worsens, is the pt aware to call PCP or go to the Emergency Dept.? Yes Was the patient provided with contact information for the PCP's office or ED? Yes Was to pt encouraged to call back with questions or concerns? Yes

## 2022-12-25 DIAGNOSIS — R8271 Bacteriuria: Secondary | ICD-10-CM | POA: Diagnosis not present

## 2023-01-06 ENCOUNTER — Encounter: Payer: Self-pay | Admitting: Internal Medicine

## 2023-01-06 ENCOUNTER — Ambulatory Visit (INDEPENDENT_AMBULATORY_CARE_PROVIDER_SITE_OTHER): Payer: Medicare Other | Admitting: Internal Medicine

## 2023-01-06 VITALS — BP 142/80 | HR 72 | Temp 98.1°F | Ht 67.0 in | Wt 141.0 lb

## 2023-01-06 DIAGNOSIS — I10 Essential (primary) hypertension: Secondary | ICD-10-CM

## 2023-01-06 DIAGNOSIS — Z0000 Encounter for general adult medical examination without abnormal findings: Secondary | ICD-10-CM

## 2023-01-06 DIAGNOSIS — N401 Enlarged prostate with lower urinary tract symptoms: Secondary | ICD-10-CM | POA: Diagnosis not present

## 2023-01-06 DIAGNOSIS — Z0001 Encounter for general adult medical examination with abnormal findings: Secondary | ICD-10-CM

## 2023-01-06 DIAGNOSIS — D539 Nutritional anemia, unspecified: Secondary | ICD-10-CM | POA: Diagnosis not present

## 2023-01-06 DIAGNOSIS — R569 Unspecified convulsions: Secondary | ICD-10-CM | POA: Diagnosis not present

## 2023-01-06 DIAGNOSIS — D638 Anemia in other chronic diseases classified elsewhere: Secondary | ICD-10-CM | POA: Diagnosis not present

## 2023-01-06 DIAGNOSIS — K5904 Chronic idiopathic constipation: Secondary | ICD-10-CM | POA: Diagnosis not present

## 2023-01-06 DIAGNOSIS — N138 Other obstructive and reflux uropathy: Secondary | ICD-10-CM | POA: Diagnosis not present

## 2023-01-06 LAB — URINALYSIS, ROUTINE W REFLEX MICROSCOPIC
Bilirubin Urine: NEGATIVE
Hgb urine dipstick: NEGATIVE
Ketones, ur: NEGATIVE
Nitrite: NEGATIVE
RBC / HPF: NONE SEEN (ref 0–?)
Specific Gravity, Urine: 1.01 (ref 1.000–1.030)
Total Protein, Urine: NEGATIVE
Urine Glucose: NEGATIVE
Urobilinogen, UA: 0.2 (ref 0.0–1.0)
pH: 7 (ref 5.0–8.0)

## 2023-01-06 LAB — COMPREHENSIVE METABOLIC PANEL
ALT: 11 U/L (ref 0–53)
AST: 18 U/L (ref 0–37)
Albumin: 4.3 g/dL (ref 3.5–5.2)
Alkaline Phosphatase: 30 U/L — ABNORMAL LOW (ref 39–117)
BUN: 18 mg/dL (ref 6–23)
CO2: 30 meq/L (ref 19–32)
Calcium: 9.5 mg/dL (ref 8.4–10.5)
Chloride: 98 meq/L (ref 96–112)
Creatinine, Ser: 1.11 mg/dL (ref 0.40–1.50)
GFR: 62.3 mL/min (ref >60.00)
Glucose, Bld: 102 mg/dL — ABNORMAL HIGH (ref 70–99)
Potassium: 4.4 meq/L (ref 3.5–5.1)
Sodium: 135 meq/L (ref 135–145)
Total Bilirubin: 0.5 mg/dL (ref 0.2–1.2)
Total Protein: 7.3 g/dL (ref 6.0–8.3)

## 2023-01-06 LAB — CBC
HCT: 33.1 % — ABNORMAL LOW (ref 39.0–52.0)
Hemoglobin: 11 g/dL — ABNORMAL LOW (ref 13.0–17.0)
MCHC: 33.4 g/dL (ref 30.0–36.0)
MCV: 85.2 fL (ref 78.0–100.0)
Platelets: 158 10*3/uL (ref 150.0–400.0)
RBC: 3.89 Mil/uL — ABNORMAL LOW (ref 4.22–5.81)
RDW: 14.7 % (ref 11.5–15.5)
WBC: 3.2 10*3/uL — ABNORMAL LOW (ref 4.0–10.5)

## 2023-01-06 LAB — VITAMIN B12: Vitamin B-12: 782 pg/mL (ref 211–911)

## 2023-01-06 LAB — FERRITIN: Ferritin: 67.6 ng/mL (ref 22.0–322.0)

## 2023-01-06 NOTE — Progress Notes (Signed)
   Subjective:   Patient ID: Evan Escobar, male    DOB: 02/18/1940, 82 y.o.   MRN: [id number]  HPI The patient is here for physical.  PMH, [facility], social history reviewed and updated  Review of Systems  Constitutional: Negative.   HENT: Negative.    Eyes: Negative.   Respiratory:  Negative for cough, chest tightness and shortness of breath.   Cardiovascular:  Negative for chest pain, palpitations and leg swelling.  Gastrointestinal:  Positive for constipation. Negative for abdominal distention, abdominal pain, diarrhea, nausea and vomiting.  Musculoskeletal: Negative.   Skin: Negative.   Neurological: Negative.   Psychiatric/Behavioral: Negative.      Objective:  Physical Exam Constitutional:      Appearance: He is well-developed.  HENT:     Head: Normocephalic and atraumatic.  Cardiovascular:     Rate and Rhythm: Normal rate and regular rhythm.  Pulmonary:     Effort: Pulmonary effort is normal. No respiratory distress.     Breath sounds: Normal breath sounds. No wheezing or rales.  Abdominal:     General: Bowel sounds are normal. There is no distension.     Palpations: Abdomen is soft.     Tenderness: There is no abdominal tenderness. There is no rebound.  Musculoskeletal:     Cervical back: Normal range of motion.  Skin:    General: Skin is warm and dry.  Neurological:     Mental Status: He is alert and oriented to person, place, and time.     Coordination: Coordination normal.     Vitals:   01/06/23 0810 01/06/23 0817  BP: (!) 142/80 (!) 142/80  Pulse: 72   Temp: 98.1 F (36.7 C)   TempSrc: Oral   SpO2: 99%   Weight: 141 lb (64 kg)   Height: 5\' 7"  (1.702 m)     Assessment & Plan:

## 2023-01-06 NOTE — Assessment & Plan Note (Signed)
Flu shot complete for season. Pneumonia complete. Shingrix complete. Tetanus due 2026. Colonoscopy aged out. Counseled about sun safety and mole surveillance. Counseled about the dangers of distracted driving. Given 10 year screening recommendations.

## 2023-01-06 NOTE — Assessment & Plan Note (Signed)
Checking CBC and ferritin and B12 and adjust as needed.

## 2023-01-06 NOTE — Patient Instructions (Signed)
Try taking a capful of miralax daily to help the bowels be regular.

## 2023-01-06 NOTE — Assessment & Plan Note (Signed)
BP at goal on lisinopril. Checking CMP and adjust as needed.  

## 2023-01-06 NOTE — Assessment & Plan Note (Signed)
No new seizures. Taking keppra 500 mg BID and continue.

## 2023-01-06 NOTE — Assessment & Plan Note (Signed)
Not taking anything for this using milk of magnesia when very constipated. Advised to resume miralax daily as this was helping.

## 2023-01-06 NOTE — Assessment & Plan Note (Signed)
Checking U/A and urine culture. He states he was told he has an infection which cannot be cured and sent to ID. I see upcoming apt. I have notes from urology but no results from U/A or culture. Treat as appropriate.Marland Kitchen

## 2023-01-10 LAB — URINE CULTURE

## 2023-01-12 ENCOUNTER — Other Ambulatory Visit: Payer: Self-pay | Admitting: Internal Medicine

## 2023-01-12 MED ORDER — SULFAMETHOXAZOLE-TRIMETHOPRIM 800-160 MG PO TABS: 1.0000 | ORAL_TABLET | Freq: Two times a day (BID) | ORAL | 0 refills | Status: AC

## 2023-01-18 ENCOUNTER — Encounter: Payer: Self-pay | Admitting: Infectious Disease

## 2023-01-18 DIAGNOSIS — R32 Unspecified urinary incontinence: Secondary | ICD-10-CM

## 2023-01-18 HISTORY — DX: Unspecified urinary incontinence: R32

## 2023-01-18 NOTE — Progress Notes (Unsigned)
Reason for Infectious Disease Consult:  Requesting Physician; [name], MD  PCP: Hillard Danker, MD   Subjective:    Patient ID: Evan Escobar, male    DOB: Nov 08, 1940, 82 y.o.   MRN: [id number]  HPI   Past Medical History:  Diagnosis Date   Allergy    Arthritis    Cataract    Glaucoma    Hypertension    Normocytic anemia 09/25/2012   Polyp, sigmoid colon 11/05/2011   Seizure (HCC)    Seizures (HCC) 03/17/2013    Past Surgical History:  Procedure Laterality Date   ANTERIOR CERVICAL DECOMP/DISCECTOMY FUSION N/A 05/22/2017   Procedure: Anterior Cervical Discectomy Fusion - Cervical Three-Cervical Four - Cervical Four-Cervical Five;  Surgeon: Donalee Citrin, MD;  Location: [facility] OR;  Service: Neurosurgery;  Laterality: N/A;   CATARACT EXTRACTION, BILATERAL     EYE SURGERY     GLAUCOMA REPAIR  10 yrs ago   TRANSURETHRAL RESECTION OF PROSTATE N/A 08/06/2022   Procedure: TRANSURETHRAL RESECTION OF THE PROSTATE (TURP) UNROOFING PROSTATIC ABSCESS;  Surgeon: [name], MD;  Location: [facility] ORS;  Service: Urology;  Laterality: N/A;  1 HR FOR CASE    Family History  Problem Relation Age of Onset   Stroke Mother    High blood pressure Mother    Stroke Father    High blood pressure Father    Colon cancer Brother    Esophageal cancer Neg Hx    Pancreatic cancer Neg Hx    Rectal cancer Neg Hx    Stomach cancer Neg Hx       Social History   Socioeconomic History   Marital status: Married    Spouse name: Kara Mead   Number of children: 1   Years of education: 11   Highest education level: Not on file  Occupational History   Occupation: retired  Tobacco Use   Smoking status: Former    Current packs/day: 0.00    Types: Cigarettes    Quit date: 02/18/2007    Years since quitting: 15.9   Smokeless tobacco: Never   Tobacco comments:    not sure but a while ago; quit smoking and ETOH  Vaping Use   Vaping status: Never Used  Substance and Sexual Activity   Alcohol use: No     Alcohol/week: 0.0 standard drinks of alcohol    Comment: quit in 2009   Drug use: No   Sexual activity: Not Currently  Other Topics Concern   Not on file  Social History Narrative   Patient lives at home with his wife Kara Mead). Patient is retired. Patient has 11 th grade education.    Caffeine- one cup of coffee daily and one soda.   Right handed.   Social Determinants of Health   Financial Resource Strain: Low Risk  (09/25/2022)   Overall Financial Resource Strain (CARDIA)    Difficulty of Paying Living Expenses: Not hard at all  Food Insecurity: No Food Insecurity (09/25/2022)   Hunger Vital Sign    Worried About Running Out of Food in the Last Year: Never true    Ran Out of Food in the Last Year: Never true  Recent Concern: Food Insecurity - Food Insecurity Present (08/08/2022)   Hunger Vital Sign    Worried About Running Out of Food in the Last Year: Sometimes true    Ran Out of Food in the Last Year: Never true  Transportation Needs: No Transportation Needs (09/25/2022)   PRAPARE - Transportation    Lack of  Transportation (Medical): No    Lack of Transportation (Non-Medical): No  Physical Activity: Inactive (09/25/2022)   Exercise Vital Sign    Days of Exercise per Week: 0 days    Minutes of Exercise per Session: 0 min  Stress: Stress Concern Present (09/25/2022)   [employer] of Occupational Health - Occupational Stress Questionnaire    Feeling of Stress : To some extent  Social Connections: Moderately Integrated (09/25/2022)   Social Connection and Isolation Panel [NHANES]    Frequency of Communication with Friends and Family: More than three times a week    Frequency of Social Gatherings with Friends and Family: Never    Attends Religious Services: More than 4 times per year    Active Member of [employer] or Organizations: No    Attends Banker Meetings: Never    Marital Status: Married    No Known Allergies   Current Outpatient Medications:    acetaminophen  (TYLENOL) 500 MG tablet, Take 500 mg by mouth every 6 (six) hours as needed for moderate pain or headache. , Disp: , Rfl:    aspirin 81 MG tablet, Take 81 mg by mouth daily., Disp: , Rfl:    bisacodyl (DULCOLAX) 10 MG suppository, Place 1 suppository (10 mg total) rectally as needed for moderate constipation. (Patient not taking: Reported on 01/06/2023), Disp: 12 suppository, Rfl: 0   brimonidine (ALPHAGAN P) 0.1 % SOLN, Place 1 drop into both eyes 2 (two) times daily., Disp: , Rfl:    cycloSPORINE (RESTASIS) 0.05 % ophthalmic emulsion, Place 1 drop into both eyes 2 (two) times daily., Disp: , Rfl:    latanoprost (XALATAN) 0.005 % ophthalmic solution, Place 1 drop into both eyes at bedtime., Disp: , Rfl:    levETIRAcetam (KEPPRA) 500 MG tablet, TAKE 1 TABLET(500 MG) BY MOUTH TWICE DAILY (Patient taking differently: Take 500 mg by mouth 2 (two) times daily.), Disp: 180 tablet, Rfl: 4   lisinopril (ZESTRIL) 20 MG tablet, TAKE 1 TABLET(20 MG) BY MOUTH DAILY (Patient taking differently: Take 20 mg by mouth daily.), Disp: 90 tablet, Rfl: 3   lubiprostone (AMITIZA) 24 MCG capsule, Take 1 capsule (24 mcg total) by mouth 2 (two) times daily with a meal., Disp: 180 capsule, Rfl: 3   magnesium hydroxide (MILK OF MAGNESIA) 400 MG/5ML suspension, Take 30 mLs by mouth daily as needed for mild constipation., Disp: , Rfl:    Multiple Vitamin (MULTIVITAMIN) tablet, Take 1 tablet by mouth daily., Disp: , Rfl:    polyethylene glycol (MIRALAX) 17 g packet, Take 17 g by mouth daily., Disp: 30 each, Rfl: 1   sulfamethoxazole-trimethoprim (BACTRIM DS) 800-160 MG tablet, Take 1 tablet by mouth 2 (two) times daily for 7 days., Disp: 14 tablet, Rfl: 0   tamsulosin (FLOMAX) 0.4 MG CAPS capsule, Take 1 capsule (0.4 mg total) by mouth in the morning and at bedtime. (Patient taking differently: Take 0.4 mg by mouth daily.), Disp: 180 capsule, Rfl: 1    Review of Systems     Objective:   Physical Exam        Assessment  & Plan:

## 2023-01-19 ENCOUNTER — Ambulatory Visit (INDEPENDENT_AMBULATORY_CARE_PROVIDER_SITE_OTHER): Payer: Medicare Other | Admitting: Infectious Disease

## 2023-01-19 ENCOUNTER — Other Ambulatory Visit: Payer: Self-pay

## 2023-01-19 ENCOUNTER — Encounter: Payer: Self-pay | Admitting: Infectious Disease

## 2023-01-19 VITALS — BP 182/73 | HR 69 | Temp 97.6°F | Wt 141.0 lb

## 2023-01-19 DIAGNOSIS — Z7185 Encounter for immunization safety counseling: Secondary | ICD-10-CM

## 2023-01-19 DIAGNOSIS — R339 Retention of urine, unspecified: Secondary | ICD-10-CM

## 2023-01-19 DIAGNOSIS — B965 Pseudomonas (aeruginosa) (mallei) (pseudomallei) as the cause of diseases classified elsewhere: Secondary | ICD-10-CM | POA: Diagnosis not present

## 2023-01-19 DIAGNOSIS — N412 Abscess of prostate: Secondary | ICD-10-CM

## 2023-01-19 DIAGNOSIS — R32 Unspecified urinary incontinence: Secondary | ICD-10-CM

## 2023-01-19 DIAGNOSIS — Z2239 Carrier of other specified bacterial diseases: Secondary | ICD-10-CM

## 2023-01-20 ENCOUNTER — Telehealth: Payer: Self-pay | Admitting: Internal Medicine

## 2023-01-20 NOTE — Telephone Encounter (Signed)
Ok fine 

## 2023-01-20 NOTE — Telephone Encounter (Signed)
Patient saw one of his other doctors yesterday who raised concerns about a medication Dr. Okey Dupre prescribed. Patient would like a call back to decide whether or not he should continue taking it. Best callback is [phone].

## 2023-01-20 NOTE — Telephone Encounter (Signed)
Spoke with patient and he explained to me the doctor told him the antibiotic was not going to work after looking at his results. So patient has stopped taking it

## 2023-01-21 ENCOUNTER — Other Ambulatory Visit: Payer: Self-pay | Admitting: Neurology

## 2023-01-22 ENCOUNTER — Other Ambulatory Visit: Payer: Self-pay | Admitting: Neurology

## 2023-02-06 ENCOUNTER — Encounter (INDEPENDENT_AMBULATORY_CARE_PROVIDER_SITE_OTHER): Payer: Medicare Other | Admitting: Ophthalmology

## 2023-02-06 DIAGNOSIS — H35033 Hypertensive retinopathy, bilateral: Secondary | ICD-10-CM

## 2023-02-06 DIAGNOSIS — H348122 Central retinal vein occlusion, left eye, stable: Secondary | ICD-10-CM

## 2023-02-06 DIAGNOSIS — H34831 Tributary (branch) retinal vein occlusion, right eye, with macular edema: Secondary | ICD-10-CM

## 2023-02-06 DIAGNOSIS — H43813 Vitreous degeneration, bilateral: Secondary | ICD-10-CM

## 2023-02-06 DIAGNOSIS — I10 Essential (primary) hypertension: Secondary | ICD-10-CM | POA: Diagnosis not present

## 2023-02-20 ENCOUNTER — Ambulatory Visit: Payer: Medicare Other | Admitting: Podiatry

## 2023-02-20 ENCOUNTER — Encounter: Payer: Self-pay | Admitting: Podiatry

## 2023-02-20 VITALS — Ht 67.0 in | Wt 141.0 lb

## 2023-02-20 DIAGNOSIS — B351 Tinea unguium: Secondary | ICD-10-CM

## 2023-02-20 DIAGNOSIS — M79674 Pain in right toe(s): Secondary | ICD-10-CM

## 2023-02-20 DIAGNOSIS — M79675 Pain in left toe(s): Secondary | ICD-10-CM | POA: Diagnosis not present

## 2023-02-20 NOTE — Progress Notes (Signed)
 This patient presents to the office with chief complaint of long thick painful nails.  Patient says the nails are painful walking and wearing shoes.  This patient is unable to self treat.  This patient is unable to trim her nails since she is unable to reach her nails.  She presents to the office for preventative foot care services.  General Appearance  Alert, conversant and in no acute stress.  Vascular  Dorsalis pedis and posterior tibial  pulses are palpable  bilaterally.  Capillary return is within normal limits  bilaterally. Temperature is within normal limits  bilaterally.  Neurologic  Senn-Weinstein monofilament wire test within normal limits  bilaterally. Muscle power within normal limits bilaterally.  Nails Thick disfigured discolored nails with subungual debris  from hallux to fifth toes bilaterally. No evidence of bacterial infection or drainage bilaterally.  Orthopedic  No limitations of motion  feet .  No crepitus or effusions noted.  HAV  B/L.  Hammer toe  B/L.  Skin  normotropic skin with no porokeratosis noted bilaterally.  No signs of infections or ulcers noted.     Onychomycosis  Nails  B/L.  Pain in right toes  Pain in left toes  Debridement of nails both feet followed trimming the nails with dremel tool.    RTC 3 months.   Cordella Bold DPM

## 2023-03-03 ENCOUNTER — Other Ambulatory Visit: Payer: Self-pay | Admitting: Neurology

## 2023-03-04 ENCOUNTER — Telehealth: Payer: Self-pay

## 2023-03-04 NOTE — Telephone Encounter (Signed)
LVM for patient to call the office to schedule an appointment.

## 2023-03-04 NOTE — Telephone Encounter (Signed)
 PT NEEDS APPT TO CONTINUE GETTING REFILLS OF [facility]. PLEASE SCHEDULE

## 2023-03-17 ENCOUNTER — Other Ambulatory Visit: Payer: Self-pay

## 2023-03-17 ENCOUNTER — Emergency Department (HOSPITAL_COMMUNITY)
Admission: EM | Admit: 2023-03-17 | Discharge: 2023-03-18 | Discharge status: Against Medical Advice | Payer: Medicare Other | Attending: Emergency Medicine | Admitting: Emergency Medicine

## 2023-03-17 ENCOUNTER — Emergency Department (HOSPITAL_COMMUNITY): Payer: Medicare Other

## 2023-03-17 DIAGNOSIS — Z5321 Procedure and treatment not carried out due to patient leaving prior to being seen by health care provider: Secondary | ICD-10-CM | POA: Diagnosis not present

## 2023-03-17 DIAGNOSIS — M79622 Pain in left upper arm: Secondary | ICD-10-CM | POA: Insufficient documentation

## 2023-03-17 DIAGNOSIS — M542 Cervicalgia: Secondary | ICD-10-CM | POA: Insufficient documentation

## 2023-03-17 DIAGNOSIS — M25512 Pain in left shoulder: Secondary | ICD-10-CM | POA: Diagnosis not present

## 2023-03-17 DIAGNOSIS — R519 Headache, unspecified: Secondary | ICD-10-CM | POA: Diagnosis not present

## 2023-03-17 DIAGNOSIS — M79603 Pain in arm, unspecified: Secondary | ICD-10-CM | POA: Diagnosis not present

## 2023-03-17 DIAGNOSIS — M19012 Primary osteoarthritis, left shoulder: Secondary | ICD-10-CM | POA: Diagnosis not present

## 2023-03-17 DIAGNOSIS — H3562 Retinal hemorrhage, left eye: Secondary | ICD-10-CM | POA: Diagnosis not present

## 2023-03-17 DIAGNOSIS — H348312 Tributary (branch) retinal vein occlusion, right eye, stable: Secondary | ICD-10-CM | POA: Diagnosis not present

## 2023-03-17 DIAGNOSIS — I6529 Occlusion and stenosis of unspecified carotid artery: Secondary | ICD-10-CM | POA: Diagnosis not present

## 2023-03-17 DIAGNOSIS — H401133 Primary open-angle glaucoma, bilateral, severe stage: Secondary | ICD-10-CM | POA: Diagnosis not present

## 2023-03-17 DIAGNOSIS — Y92410 Unspecified street and highway as the place of occurrence of the external cause: Secondary | ICD-10-CM | POA: Diagnosis not present

## 2023-03-17 DIAGNOSIS — Z041 Encounter for examination and observation following transport accident: Secondary | ICD-10-CM | POA: Diagnosis not present

## 2023-03-17 DIAGNOSIS — H16223 Keratoconjunctivitis sicca, not specified as Sjogren's, bilateral: Secondary | ICD-10-CM | POA: Diagnosis not present

## 2023-03-17 DIAGNOSIS — I672 Cerebral atherosclerosis: Secondary | ICD-10-CM | POA: Diagnosis not present

## 2023-03-17 DIAGNOSIS — Z981 Arthrodesis status: Secondary | ICD-10-CM | POA: Diagnosis not present

## 2023-03-17 DIAGNOSIS — H348121 Central retinal vein occlusion, left eye, with retinal neovascularization: Secondary | ICD-10-CM | POA: Diagnosis not present

## 2023-03-17 DIAGNOSIS — Z743 Need for continuous supervision: Secondary | ICD-10-CM | POA: Diagnosis not present

## 2023-03-17 MED ORDER — ACETAMINOPHEN 325 MG PO TABS: 650.0000 mg | ORAL_TABLET | Freq: Four times a day (QID) | ORAL | Status: DC | PRN
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
## 2023-03-18 NOTE — ED Notes (Signed)
Pt was called 3x no response.

## 2023-03-24 DIAGNOSIS — M5416 Radiculopathy, lumbar region: Secondary | ICD-10-CM | POA: Diagnosis not present

## 2023-03-24 DIAGNOSIS — M542 Cervicalgia: Secondary | ICD-10-CM | POA: Diagnosis not present

## 2023-03-26 ENCOUNTER — Ambulatory Visit (INDEPENDENT_AMBULATORY_CARE_PROVIDER_SITE_OTHER): Payer: Medicare Other | Admitting: Internal Medicine

## 2023-03-26 ENCOUNTER — Encounter: Payer: Self-pay | Admitting: Internal Medicine

## 2023-03-26 VITALS — BP 140/80 | HR 65 | Temp 98.3°F | Ht 67.0 in | Wt 149.0 lb

## 2023-03-26 DIAGNOSIS — Z8673 Personal history of transient ischemic attack (TIA), and cerebral infarction without residual deficits: Secondary | ICD-10-CM

## 2023-03-26 DIAGNOSIS — M5412 Radiculopathy, cervical region: Secondary | ICD-10-CM

## 2023-03-26 MED ORDER — METHOCARBAMOL 500 MG PO TABS: 500.0000 mg | ORAL_TABLET | Freq: Three times a day (TID) | ORAL | 0 refills | Status: DC | PRN

## 2023-03-26 NOTE — Patient Instructions (Signed)
 We have sent in methocarbamol  to use for pain up to 3 times a day. This is a muscle relaxer and is safe to take with tylenol .
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
## 2023-03-27 DIAGNOSIS — Z8673 Personal history of transient ischemic attack (TIA), and cerebral infarction without residual deficits: Secondary | ICD-10-CM | POA: Insufficient documentation
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
## 2023-03-27 NOTE — Assessment & Plan Note (Signed)
 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 Reviewed that this was present 2020 when we had checked and this was around the time his left sided issues had started and likely are related.

## 2023-04-07 DIAGNOSIS — H348121 Central retinal vein occlusion, left eye, with retinal neovascularization: Secondary | ICD-10-CM | POA: Diagnosis not present

## 2023-04-07 DIAGNOSIS — H16223 Keratoconjunctivitis sicca, not specified as Sjogren's, bilateral: Secondary | ICD-10-CM | POA: Diagnosis not present

## 2023-04-07 DIAGNOSIS — H3562 Retinal hemorrhage, left eye: Secondary | ICD-10-CM | POA: Diagnosis not present

## 2023-04-07 DIAGNOSIS — H348312 Tributary (branch) retinal vein occlusion, right eye, stable: Secondary | ICD-10-CM | POA: Diagnosis not present

## 2023-04-07 DIAGNOSIS — H401133 Primary open-angle glaucoma, bilateral, severe stage: Secondary | ICD-10-CM | POA: Diagnosis not present

## 2023-04-13 ENCOUNTER — Telehealth: Payer: Self-pay | Admitting: Neurology

## 2023-04-13 MED ORDER — LEVETIRACETAM 500 MG PO TABS: 500.0000 mg | ORAL_TABLET | Freq: Two times a day (BID) | ORAL | 0 refills | Status: DC

## 2023-04-13 NOTE — Telephone Encounter (Signed)
 Refilled enough to get them to appt

## 2023-04-13 NOTE — Telephone Encounter (Signed)
 Pt request refill for levETIRAcetam (KEPPRA) 500 MG tablet send to [facility] [phone]

## 2023-04-14 DIAGNOSIS — H16223 Keratoconjunctivitis sicca, not specified as Sjogren's, bilateral: Secondary | ICD-10-CM | POA: Diagnosis not present

## 2023-04-14 DIAGNOSIS — H401133 Primary open-angle glaucoma, bilateral, severe stage: Secondary | ICD-10-CM | POA: Diagnosis not present

## 2023-04-14 DIAGNOSIS — H3562 Retinal hemorrhage, left eye: Secondary | ICD-10-CM | POA: Diagnosis not present

## 2023-04-20 ENCOUNTER — Telehealth: Payer: Self-pay | Admitting: Internal Medicine

## 2023-04-20 NOTE — Telephone Encounter (Signed)
Placed inside office box 

## 2023-04-20 NOTE — Telephone Encounter (Signed)
 Pt has dropped off a parking placard for his provider to fill out and it has been placed in Dr. Lawana Chambers box.  Please call pt when form is ready to be picked up: [phone]

## 2023-05-08 ENCOUNTER — Other Ambulatory Visit: Payer: Self-pay | Admitting: Internal Medicine

## 2023-05-08 DIAGNOSIS — I10 Essential (primary) hypertension: Secondary | ICD-10-CM

## 2023-05-18 ENCOUNTER — Other Ambulatory Visit: Payer: Self-pay | Admitting: Internal Medicine

## 2023-05-21 ENCOUNTER — Ambulatory Visit: Payer: Medicare Other | Admitting: Podiatry

## 2023-06-04 ENCOUNTER — Encounter: Payer: Self-pay | Admitting: Neurology

## 2023-06-04 ENCOUNTER — Ambulatory Visit: Payer: Medicare Other | Admitting: Neurology

## 2023-06-04 VITALS — BP 159/69 | HR 67 | Ht 67.0 in | Wt 144.0 lb

## 2023-06-04 DIAGNOSIS — R569 Unspecified convulsions: Secondary | ICD-10-CM | POA: Diagnosis not present

## 2023-06-04 MED ORDER — LEVETIRACETAM 250 MG PO TABS: 250.0000 mg | ORAL_TABLET | Freq: Two times a day (BID) | ORAL | 11 refills | Status: DC
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
## 2023-06-09 ENCOUNTER — Other Ambulatory Visit: Admitting: *Deleted

## 2023-06-17 NOTE — Telephone Encounter (Signed)
 Pt has called and r/s the appointment

## 2023-06-19 ENCOUNTER — Ambulatory Visit: Admitting: Podiatry

## 2023-06-19 ENCOUNTER — Other Ambulatory Visit: Payer: Self-pay | Admitting: Neurology

## 2023-06-19 NOTE — Telephone Encounter (Signed)
 Last seen on 06/04/23 Follow up scheduled on 06/09/24

## 2023-06-22 ENCOUNTER — Ambulatory Visit: Admitting: Neurology

## 2023-06-22 DIAGNOSIS — R569 Unspecified convulsions: Secondary | ICD-10-CM | POA: Diagnosis not present

## 2023-06-26 ENCOUNTER — Ambulatory Visit: Admitting: Podiatry

## 2023-07-03 ENCOUNTER — Ambulatory Visit: Admitting: Podiatry

## 2023-07-03 ENCOUNTER — Encounter: Payer: Self-pay | Admitting: Podiatry

## 2023-07-03 DIAGNOSIS — M79672 Pain in left foot: Secondary | ICD-10-CM

## 2023-07-03 DIAGNOSIS — M79671 Pain in right foot: Secondary | ICD-10-CM | POA: Diagnosis not present

## 2023-07-03 DIAGNOSIS — B351 Tinea unguium: Secondary | ICD-10-CM

## 2023-07-03 NOTE — Progress Notes (Signed)
 Patient presents for evaluation and treatment of tenderness and some redness around nails feet.  Tenderness around toes with walking and wearing shoes.  Physical exam:  General appearance: Alert, pleasant, and in no acute distress.  Vascular: Pedal pulses: DP palpable bilaterally, PT nonpalpable bilaterally.  Mild edema lower legs bilaterally  Neurological:  No complaint of burning or numbness feet  Dermatologic:  Nails thickened, disfigured, discolored 1-5 BL with subungual debris.  Redness and hypertrophic nail folds along nail folds bilaterally but no signs of drainage or infection.  Musculoskeletal:  Hammertoes 2 through 5 bilaterally   Diagnosis: 1.  Painful onychomycotic nails 1 through 5 bilaterally. 2. Pain toes 1 through 5 bilaterally.  Plan: Debrided onychomycotic nails 1 through 5 bilaterally.  Return 3 months

## 2023-07-24 ENCOUNTER — Ambulatory Visit: Payer: Self-pay | Admitting: Neurology

## 2023-07-24 NOTE — Procedures (Signed)
   HISTORY: 83 year old male with history of seizure  TECHNIQUE:  This is a routine 16 channel EEG recording with one channel devoted to a limited EKG recording.  It was performed during wakefulness, drowsiness and asleep.  Photic stimulation were performed as activating procedures.  There are minimum muscle and movement artifact noted.  Upon maximum arousal, posterior dominant waking rhythm consistent of rhythmic alpha range activity. Activities are symmetric over the bilateral posterior derivations and attenuated with eye opening.  Photic stimulation did not alter the tracing.  Hyperventilation was not performed  During EEG recording, patient developed drowsiness and no deeper stage of sleep was achieved  During EEG recording, there was no epileptiform discharge noted.  EKG demonstrate normal sinus rhythm.  CONCLUSION: This is a  normal awake EEG.  There is no electrodiagnostic evidence of epileptiform discharge.  Shaneece Stockburger, M.D. Ph.D.  [facility] [address] [place], [place] [address] Phone: [phone] Fax:      [phone]

## 2023-07-27 DIAGNOSIS — H348312 Tributary (branch) retinal vein occlusion, right eye, stable: Secondary | ICD-10-CM | POA: Diagnosis not present

## 2023-07-27 DIAGNOSIS — H16223 Keratoconjunctivitis sicca, not specified as Sjogren's, bilateral: Secondary | ICD-10-CM | POA: Diagnosis not present

## 2023-07-27 DIAGNOSIS — H401133 Primary open-angle glaucoma, bilateral, severe stage: Secondary | ICD-10-CM | POA: Diagnosis not present

## 2023-07-27 DIAGNOSIS — H348121 Central retinal vein occlusion, left eye, with retinal neovascularization: Secondary | ICD-10-CM | POA: Diagnosis not present

## 2023-07-27 DIAGNOSIS — H3562 Retinal hemorrhage, left eye: Secondary | ICD-10-CM | POA: Diagnosis not present

## 2023-08-06 ENCOUNTER — Ambulatory Visit (INDEPENDENT_AMBULATORY_CARE_PROVIDER_SITE_OTHER): Admitting: Podiatry

## 2023-08-06 DIAGNOSIS — Z91199 Patient's noncompliance with other medical treatment and regimen due to unspecified reason: Secondary | ICD-10-CM

## 2023-08-07 NOTE — Progress Notes (Signed)
 Patient was no-show for appointment today

## 2023-08-19 ENCOUNTER — Encounter: Payer: Self-pay | Admitting: Internal Medicine

## 2023-08-19 ENCOUNTER — Ambulatory Visit: Admitting: Internal Medicine

## 2023-08-19 VITALS — BP 134/80 | HR 57 | Temp 98.5°F | Ht 67.0 in | Wt 139.6 lb

## 2023-08-19 DIAGNOSIS — K5904 Chronic idiopathic constipation: Secondary | ICD-10-CM

## 2023-08-19 DIAGNOSIS — I10 Essential (primary) hypertension: Secondary | ICD-10-CM

## 2023-08-19 MED ORDER — LINACLOTIDE 72 MCG PO CAPS: 72.0000 ug | ORAL_CAPSULE | Freq: Every day | ORAL | 5 refills | Status: DC

## 2023-08-19 NOTE — Progress Notes (Signed)
 Patient ID: Evan Escobar, male   DOB: 1941-02-06, 83 y.o.   MRN: [id number]        Chief Complaint: follow up constipation, htn       HPI:  Evan Escobar is a 83 y.o. male here with c/o flare of worsening constipation with increased difficulty passing stool, though no worsening nausea, pain, fever , or blood.Not better with 3 days miralax  then MOM on top that that.    Denies worsening reflux, dysphagia.  Pt denies chest pain, increased sob or doe, wheezing, orthopnea, PND, increased LE swelling, palpitations, dizziness or syncope.      Wt Readings from Last 3 Encounters:  08/19/23 139 lb 9.6 oz (63.3 kg)  06/04/23 144 lb (65.3 kg)  03/26/23 149 lb (67.6 kg)   BP Readings from Last 3 Encounters:  08/19/23 134/80  06/04/23 (!) 159/69  03/26/23 (!) 140/80         Past Medical History:  Diagnosis Date   Allergy    Arthritis    Cataract    Glaucoma    Hypertension    Normocytic anemia 09/25/2012   Polyp, sigmoid colon 11/05/2011   Seizure (HCC)    Seizures (HCC) 03/17/2013   Urinary incontinence 01/18/2023   Past Surgical History:  Procedure Laterality Date   ANTERIOR CERVICAL DECOMP/DISCECTOMY FUSION N/A 05/22/2017   Procedure: Anterior Cervical Discectomy Fusion - Cervical Three-Cervical Four - Cervical Four-Cervical Five;  Surgeon: Onetha Kuba, MD;  Location[facility];  Service: Neurosurgery;  Laterality: N/A;   CATARACT EXTRACTION, BILATERAL     EYE SURGERY     GLAUCOMA REPAIR  10 yrs ago   TRANSURETHRAL RESECTION OF PROSTATE N/A 08/06/2022   Procedure: TRANSURETHRAL RESECTION OF THE PROSTATE (TURP) UNROOFING PROSTATIC ABSCESS;  Surgeon: Watt Rush, MD;  Location: [facility] ORS;  Service: Urology;  Laterality: N/A;  1 HR FOR CASE    reports that he quit smoking about 16 years ago. His smoking use included cigarettes. He has never used smokeless tobacco. He reports that he does not drink alcohol and does not use drugs. family history includes Colon cancer in his brother; High blood  pressure in his father and mother; Stroke in his father and mother. No Known Allergies Current Outpatient Medications on File Prior to Visit  Medication Sig Dispense Refill   acetaminophen  (TYLENOL ) 500 MG tablet Take 500 mg by mouth every 6 (six) hours as needed for moderate pain or headache.      aspirin  81 MG tablet Take 81 mg by mouth daily.     brimonidine (ALPHAGAN P) 0.1 % SOLN Place 1 drop into both eyes 2 (two) times daily.     cycloSPORINE  (RESTASIS ) 0.05 % ophthalmic emulsion Place 1 drop into both eyes 2 (two) times daily.     latanoprost  (XALATAN ) 0.005 % ophthalmic solution Place 1 drop into both eyes at bedtime.     levETIRAcetam  (KEPPRA ) 250 MG tablet Take 1 tablet (250 mg total) by mouth 2 (two) times daily. APPOINTMENT NEEDED FOR FURTHER REFILLS 60 tablet 11   lisinopril  (ZESTRIL ) 20 MG tablet TAKE 1 TABLET(20 MG) BY MOUTH DAILY 90 tablet 3   magnesium  hydroxide (MILK OF MAGNESIA) 400 MG/5ML suspension Take 30 mLs by mouth daily as needed for mild constipation.     methocarbamol  (ROBAXIN ) 500 MG tablet Take 1 tablet (500 mg total) by mouth every 8 (eight) hours as needed for muscle spasms. 60 tablet 0   Multiple Vitamin (MULTIVITAMIN) tablet Take 1 tablet by mouth daily.  polyethylene glycol (MIRALAX ) 17 g packet Take 17 g by mouth daily. 30 each 1   tamsulosin  (FLOMAX ) 0.4 MG CAPS capsule TAKE 1 CAPSULE(0.4 MG) BY MOUTH IN THE MORNING AND AT BEDTIME 180 capsule 1   No current facility-administered medications on file prior to visit.        ROS:  All others reviewed and negative.  Objective        PE:  BP 134/80 (BP Location: Left Arm, Patient Position: Sitting)   Pulse (!) 57   Temp 98.5 F (36.9 C) (Temporal)   Ht 5' 7 (1.702 m)   Wt 139 lb 9.6 oz (63.3 kg)   SpO2 100%   BMI 21.86 kg/m                 Constitutional: Pt appears in NAD               HENT: Head: NCAT.                Right Ear: External ear normal.                 Left Ear: External ear  normal.                Eyes: . Pupils are equal, round, and reactive to light. Conjunctivae and EOM are normal               Nose: without d/c or deformity               Neck: Neck supple. Gross normal ROM               Cardiovascular: Normal rate and regular rhythm.                 Pulmonary/Chest: Effort normal and breath sounds without rales or wheezing.                Abd:  Soft, NT, ND, + BS, no organomegaly - benign               Neurological: Pt is alert. At baseline orientation, motor grossly intact               Skin: Skin is warm. No rashes, no other new lesions, LE edema - none               Psychiatric: Pt behavior is normal without agitation   Micro: none  Cardiac tracings I have personally interpreted today:  none  Pertinent Radiological findings (summarize): none   Lab Results  Component Value Date   WBC 3.2 (L) 01/06/2023   HGB 11.0 (L) 01/06/2023   HCT 33.1 (L) 01/06/2023   PLT 158.0 01/06/2023   GLUCOSE 102 (H) 01/06/2023   CHOL 188 04/07/2022   TRIG 48.0 04/07/2022   HDL 77.40 04/07/2022   LDLCALC 101 (H) 04/07/2022   ALT 11 01/06/2023   AST 18 01/06/2023   NA 135 01/06/2023   K 4.4 01/06/2023   CL 98 01/06/2023   CREATININE 1.11 01/06/2023   BUN 18 01/06/2023   CO2 30 01/06/2023   TSH 0.96 10/13/2019   PSA 3.27 11/10/2017   INR 1.2 08/05/2022   HGBA1C 5.8 01/10/2019   Assessment/Plan:  Evan Escobar is a 83 y.o. Black or African American [2] male with  has a past medical history of Allergy, Arthritis, Cataract, Glaucoma, Hypertension, Normocytic anemia (09/25/2012), Polyp, sigmoid colon (11/05/2011), Seizure (HCC), Seizures (HCC) (03/17/2013), and Urinary incontinence (01/18/2023).  Chronic  idiopathic constipation Mild to mod worsening, for colace 100 mg bid prn, also Linzess  72 mg daily prn, to f/u any worsening symptoms or concerns  Essential hypertension BP Readings from Last 3 Encounters:  08/19/23 134/80  06/04/23 (!) 159/69  03/26/23 (!)  140/80   Stable, pt to continue medical treatment lisinopril  20 mg qd  Followup: Return if symptoms worsen or fail to improve.  Lynwood Rush, MD 08/19/2023 1:31 PM [facility]

## 2023-08-19 NOTE — Assessment & Plan Note (Signed)
 Mild to mod worsening, for colace 100 mg bid prn, also Linzess  72 mg daily prn, to f/u any worsening symptoms or concerns

## 2023-08-19 NOTE — Patient Instructions (Signed)
 Please take all new medication as prescribed- the Linzess  as needed  Please also take OTC Colace 100 mg twice per day as a stool softner  Please continue all other medications as before, and refills have been done if requested.  Please have the pharmacy call with any other refills you may need.  Please keep your appointments with your specialists as you may have planned

## 2023-08-19 NOTE — Assessment & Plan Note (Signed)
 BP Readings from Last 3 Encounters:  08/19/23 134/80  06/04/23 (!) 159/69  03/26/23 (!) 140/80   Stable, pt to continue medical treatment lisinopril  20 mg qd

## 2023-08-24 ENCOUNTER — Ambulatory Visit: Payer: Self-pay | Admitting: *Deleted

## 2023-08-24 NOTE — Telephone Encounter (Signed)
 FYI Only or Action Required?: FYI only for provider.  Patient was last seen in primary care on 08/19/2023 by [name] ORN, MD.  Called Nurse Triage reporting Constipation.  Symptoms began several days ago.  Interventions attempted: Prescription medications: linzess .  Symptoms are: unchanged.  Triage Disposition: See PCP When Office is Open (Within 3 Days)  Patient/caregiver understands and will follow disposition?:   Reason for Disposition  Unable to have a bowel movement (BM) without laxative or enema  Answer Assessment - Initial Assessment Questions 1. STOOL PATTERN OR FREQUENCY: How often do you have a bowel movement (BM)?  (Normal range: 3 times a day to every 3 days)  When was your last BM?       Last BM- few days now- no complete BM in a good while, patient feels pressure to go but nothing is moving- passes gas 2. STRAINING: Do you have to strain to have a BM?      Patient is trying not to strain 3. RECTAL PAIN: Does your rectum hurt when the stool comes out? If Yes, ask: Do you have hemorrhoids? How bad is the pain?  (Scale 1-10; or mild, moderate, severe)     No- patient has stool softener- but not passing stool to tell 4. STOOL COMPOSITION: Are the stools hard?      2-3 days 5. BLOOD ON STOOLS: Has there been any blood on the toilet tissue or on the surface of the BM? If Yes, ask: When was the last time?     no 6. CHRONIC CONSTIPATION: Is this a new problem for you?  If No, ask: How long have you had this problem? (days, weeks, months)      Patient has had constipation issue for a long time- worse now 7. CHANGES IN DIET OR HYDRATION: Have there been any recent changes in your diet? How much fluids are you drinking on a daily basis?  How much have you had to drink today?     Patient is drinking- 4-5 16oz waters/day 8. MEDICINES: Have you been taking any new medicines? Are you taking any narcotic pain medicines? (e.g., Dilaudid , morphine, Percocet,  Vicodin)     no 9. LAXATIVES: Have you been using any stool softeners, laxatives, or enemas?  If Yes, ask What, how often, and when was the last time?     Patient was using laxative- stopped working  11. CAUSE: What do you think is causing the constipation?        Not sure- seems that stool is not moving 12. OTHER SYMPTOMS: Do you have any other symptoms? (e.g., abdomen pain, bloating, fever, vomiting)       Some bloating  Protocols used: Constipation-A-AH  Copied from CRM [id number]. Topic: Clinical - Medical Advice >> Aug 24, 2023 12:57 PM [name] I wrote: Reason for CRM:  Patient was prescribed linaclotide  (LINZESS ) 72 MCG capsule  for constipation but it is not working, patient is also taking stool softener as well. Patient would like to know what else he can do for constipation or if anything else can be prescribed.

## 2023-08-25 ENCOUNTER — Ambulatory Visit (INDEPENDENT_AMBULATORY_CARE_PROVIDER_SITE_OTHER): Admitting: Internal Medicine

## 2023-08-25 ENCOUNTER — Encounter: Payer: Self-pay | Admitting: Internal Medicine

## 2023-08-25 VITALS — BP 142/66 | HR 67 | Temp 97.9°F | Ht 67.0 in | Wt 139.4 lb

## 2023-08-25 DIAGNOSIS — K5904 Chronic idiopathic constipation: Secondary | ICD-10-CM | POA: Diagnosis not present

## 2023-08-25 DIAGNOSIS — H6123 Impacted cerumen, bilateral: Secondary | ICD-10-CM

## 2023-08-25 DIAGNOSIS — I10 Essential (primary) hypertension: Secondary | ICD-10-CM

## 2023-08-25 MED ORDER — LINACLOTIDE 145 MCG PO CAPS: 145.0000 ug | ORAL_CAPSULE | Freq: Every day | ORAL | 11 refills | Status: DC

## 2023-08-25 NOTE — Patient Instructions (Addendum)
 Ok to increase the linzess  to 145 mcg per day  Ok to take the Miralax  once daily with it, as well as the stool softner   Please continue all other medications as before, and refills have been done if requested.  Please have the pharmacy call with any other refills you may need.  Please keep your appointments with your specialists as you may have planned  If you continue to have pain and /or worsening constipation, we could consider CT scan for the abdominal area  No lab work needed today  Your ears were cleared of wax today

## 2023-08-25 NOTE — Progress Notes (Unsigned)
 Patient ID: Evan Escobar, male   DOB: 1941/01/24, 83 y.o.   MRN: [id number]        Chief Complaint: follow up persistent constipation, bilateral ear wax impactions, htn       HPI:  Evan Escobar is a 83 y.o. male here with c/o persistent constipation despite small Bms with linzess  72 mcg every day   Also taking daily miralax  and colace bid.  Denies worsening reflux, dysphagia, n/v, bowel change or blood.   Pt denies chest pain, increased sob or doe, wheezing, orthopnea, PND, increased LE swelling, palpitations, dizziness or syncope.   Pt denies polydipsia, polyuria, or new focal neuro s/s.   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  Last colonoscopy 2018 without polyp or malignancy       Wt Readings from Last 3 Encounters:  08/27/23 139 lb (63 kg)  08/25/23 139 lb 6.4 oz (63.2 kg)  08/19/23 139 lb 9.6 oz (63.3 kg)   BP Readings from Last 3 Encounters:  08/25/23 (!) 142/66  08/19/23 134/80  06/04/23 (!) 159/69         Past Medical History:  Diagnosis Date   Allergy    Arthritis    Cataract    Glaucoma    Hypertension    Normocytic anemia 09/25/2012   Polyp, sigmoid colon 11/05/2011   Seizure (HCC)    Seizures (HCC) 03/17/2013   Urinary incontinence 01/18/2023   Past Surgical History:  Procedure Laterality Date   ANTERIOR CERVICAL DECOMP/DISCECTOMY FUSION N/A 05/22/2017   Procedure: Anterior Cervical Discectomy Fusion - Cervical Three-Cervical Four - Cervical Four-Cervical Five;  Surgeon: Onetha Kuba, MD;  Location: [facility] OR;  Service: Neurosurgery;  Laterality: N/A;   CATARACT EXTRACTION, BILATERAL     EYE SURGERY     GLAUCOMA REPAIR  10 yrs ago   TRANSURETHRAL RESECTION OF PROSTATE N/A 08/06/2022   Procedure: TRANSURETHRAL RESECTION OF THE PROSTATE (TURP) UNROOFING PROSTATIC ABSCESS;  Surgeon: Watt Rush, MD;  Location: [facility] ORS;  Service: Urology;  Laterality: N/A;  1 HR FOR CASE    reports that he quit smoking about 16 years ago. His smoking use included cigarettes. He  has never used smokeless tobacco. He reports that he does not drink alcohol and does not use drugs. family history includes Colon cancer in his brother; High blood pressure in his father and mother; Stroke in his father and mother. No Known Allergies Current Outpatient Medications on File Prior to Visit  Medication Sig Dispense Refill   acetaminophen  (TYLENOL ) 500 MG tablet Take 500 mg by mouth every 6 (six) hours as needed for moderate pain or headache.      aspirin  81 MG tablet Take 81 mg by mouth daily.     brimonidine (ALPHAGAN P) 0.1 % SOLN Place 1 drop into both eyes 2 (two) times daily.     cycloSPORINE  (RESTASIS ) 0.05 % ophthalmic emulsion Place 1 drop into both eyes 2 (two) times daily.     latanoprost  (XALATAN ) 0.005 % ophthalmic solution Place 1 drop into both eyes at bedtime.     levETIRAcetam  (KEPPRA ) 250 MG tablet Take 1 tablet (250 mg total) by mouth 2 (two) times daily. APPOINTMENT NEEDED FOR FURTHER REFILLS 60 tablet 11   lisinopril  (ZESTRIL ) 20 MG tablet TAKE 1 TABLET(20 MG) BY MOUTH DAILY 90 tablet 3   magnesium  hydroxide (MILK OF MAGNESIA) 400 MG/5ML suspension Take 30 mLs by mouth daily as needed for mild constipation.     methocarbamol  (ROBAXIN ) 500 MG tablet Take  1 tablet (500 mg total) by mouth every 8 (eight) hours as needed for muscle spasms. 60 tablet 0   Multiple Vitamin (MULTIVITAMIN) tablet Take 1 tablet by mouth daily.     polyethylene glycol (MIRALAX ) 17 g packet Take 17 g by mouth daily. 30 each 1   tamsulosin  (FLOMAX ) 0.4 MG CAPS capsule TAKE 1 CAPSULE(0.4 MG) BY MOUTH IN THE MORNING AND AT BEDTIME 180 capsule 1   No current facility-administered medications on file prior to visit.        ROS:  All others reviewed and negative.  Objective        PE:  BP (!) 142/66   Pulse 67   Temp 97.9 F (36.6 C)   Ht 5' 7 (1.702 m)   Wt 139 lb 6.4 oz (63.2 kg)   SpO2 99%   BMI 21.83 kg/m                 Constitutional: Pt appears in NAD               HENT:  Head: NCAT.                Right Ear: External ear normal.                 Left Ear: External ear normal.                Eyes: . Pupils are equal, round, and reactive to light. Conjunctivae and EOM are normal               Nose: without d/c or deformity               Neck: Neck supple. Gross normal ROM               Cardiovascular: Normal rate and regular rhythm.                 Pulmonary/Chest: Effort normal and breath sounds without rales or wheezing.                Abd:  Soft, NT but mild firm diffusely, ND, + BS, no organomegaly               Neurological: Pt is alert. At baseline orientation, motor grossly intact               Skin: Skin is warm. No rashes, no other new lesions, LE edema - none               Psychiatric: Pt behavior is normal without agitation   Micro: none  Cardiac tracings I have personally interpreted today:  none  Pertinent Radiological findings (summarize): none   Lab Results  Component Value Date   WBC 3.2 (L) 01/06/2023   HGB 11.0 (L) 01/06/2023   HCT 33.1 (L) 01/06/2023   PLT 158.0 01/06/2023   GLUCOSE 102 (H) 01/06/2023   CHOL 188 04/07/2022   TRIG 48.0 04/07/2022   HDL 77.40 04/07/2022   LDLCALC 101 (H) 04/07/2022   ALT 11 01/06/2023   AST 18 01/06/2023   NA 135 01/06/2023   K 4.4 01/06/2023   CL 98 01/06/2023   CREATININE 1.11 01/06/2023   BUN 18 01/06/2023   CO2 30 01/06/2023   TSH 0.96 10/13/2019   PSA 3.27 11/10/2017   INR 1.2 08/05/2022   HGBA1C 5.8 01/10/2019   Assessment/Plan:  Evan Escobar is a 83 y.o. Black or African American [2] male with  has a past medical history of Allergy, Arthritis, Cataract, Glaucoma, Hypertension, Normocytic anemia (09/25/2012), Polyp, sigmoid colon (11/05/2011), Seizure (HCC), Seizures (HCC) (03/17/2013), and Urinary incontinence (01/18/2023).  Essential hypertension BP Readings from Last 3 Encounters:  08/25/23 (!) 142/66  08/19/23 134/80  06/04/23 (!) 159/69   Uncontrolled, likely reactive, pt  to continue medical treatment lisinopril  20 qd   Chronic idiopathic constipation Pt with persistent symptoms, ok to continue miralax  daily, colace bid, and increased linzess  to 142 mcg qd  Bilateral impacted cerumen Cleared today, hearing improved,  to f/u any worsening symptoms or concerns  Followup: Return if symptoms worsen or fail to improve.  Evan Rush, MD 08/27/2023 7:34 PM [facility] - [facility]

## 2023-08-26 ENCOUNTER — Ambulatory Visit: Payer: Self-pay

## 2023-08-26 NOTE — Telephone Encounter (Signed)
 FYI Only or Action Required?: FYI only for provider.  Patient was last seen in primary care on 08/25/2023 by [name] ORN, MD.  Called Nurse Triage reporting Constipation.  Symptoms began 3 days ago.  Interventions attempted: Prescription medications: Linzess ,colace and miralax  and Rest, hydration, or home remedies.  Symptoms are: gradually worsening.  Triage Disposition: See HCP Within 4 Hours (Or PCP Triage)-instructed to the ED if unsuccessful with Miralax .   Patient/caregiver understands and will follow disposition?: Yes  Copied from CRM [id number]. Topic: Clinical - Red Word Triage >> Aug 26, 2023  3:10 PM Deaijah H wrote: Red Word that prompted transfer to Nurse Triage: Was advised to take Take 2 tablets linaclotide  but still of Real bad pain & cannot go to restroom  1. STOOL PATTERN OR FREQUENCY: How often do you have a bowel movement (BM)? (Normal range: 3 times a day to every 3 days) When was your last BM? Patient reports his last bowel movement was three days ago. Patient states his bowel schedule is different depending on how he eats.  2. STRAINING: Do you have to strain to have a BM?-patient reports he is having to strain.    3. ONSET: When did the constipation begin? Three days ago  4. RECTAL PAIN: Does your rectum hurt when the stool comes out? If Yes, ask: Do you have hemorrhoids? How bad is the pain? (Scale 1-10; or mild, moderate, severe)  5. BM COMPOSITION: Are the stools hard? Patient states his stool have been soft.  6. BLOOD ON STOOLS: Has there been any blood on the toilet tissue or on the surface of the BM? If Yes, ask: When was the last time?No  7. CHRONIC CONSTIPATION: Is this a new problem for you? If No, ask: How long have you had this problem? (days, weeks, months) yes  8. CHANGES IN DIET OR HYDRATION: Have there been any recent changes in your diet? How much fluids are you drinking on a daily basis? How much have you had to drink  today? No changes in diet.   9. MEDICINES: Have you been taking any new medicines? Are you taking any narcotic pain medicines? (e.g., Dilaudid , morphine, Percocet, Vicodin) no  10. LAXATIVES: Have you been using any stool softeners, laxatives, or enemas? If Yes, ask What are you using, how often, and when was the last time?  Took linzess , colace.  11. ACTIVITY: How much walking do you do every day? Has your activity level decreased in the past week? Typically yes but patient reports generally not feeling well due to the constipation.   12. CAUSE: What do you think is causing the constipation? Unsure but history of chronic constipation  13. MEDICAL HISTORY: Do you have a history of hemorrhoids, rectal fissures, rectal surgery, or rectal abscess? NO  14. OTHER SYMPTOMS: Do you have any other symptoms? (e.g., abdomen pain, bloating, fever, vomiting) bloating, abdominal cramping  Patient recommended to the ED due to continued pain with constipation. Patient reporting abdominal bloating and cramping. Reviewed instructions from patient's appointment yesterday-increased to increase Linzess  and take both Miralax  and stool softener. Patient reports that he has done that except no Miralax  today. Patient is instructed to try a dose of Miralax  and try ambulating around his house to help with motility. Patient is instructed that if he has no success than he should proceed to the ED. Patient verbalized understanding and all questions answered.   Reason for Disposition  Unable to have a bowel movement (BM) without manually removing  stool (using finger to pull out stool or perform disimpaction)  Protocols used: Constipation-A-AH

## 2023-08-27 ENCOUNTER — Encounter: Payer: Self-pay | Admitting: Internal Medicine

## 2023-08-27 ENCOUNTER — Encounter: Payer: Self-pay | Admitting: Podiatry

## 2023-08-27 ENCOUNTER — Ambulatory Visit: Admitting: Podiatry

## 2023-08-27 VITALS — Ht 67.0 in | Wt 139.0 lb

## 2023-08-27 DIAGNOSIS — M2042 Other hammer toe(s) (acquired), left foot: Secondary | ICD-10-CM

## 2023-08-27 DIAGNOSIS — H6123 Impacted cerumen, bilateral: Secondary | ICD-10-CM | POA: Insufficient documentation

## 2023-08-27 DIAGNOSIS — L600 Ingrowing nail: Secondary | ICD-10-CM | POA: Diagnosis not present

## 2023-08-27 NOTE — Assessment & Plan Note (Signed)
 BP Readings from Last 3 Encounters:  08/25/23 (!) 142/66  08/19/23 134/80  06/04/23 (!) 159/69   Uncontrolled, likely reactive, pt to continue medical treatment lisinopril  20 qd

## 2023-08-27 NOTE — Progress Notes (Signed)
 Subjective:   Patient ID: Evan Escobar, male   DOB: 83 y.o.   MRN: [id number]   HPI Patient presents stating he is having problems where the big toenail left to be ingrown and also is developing a lesion on the second toe left with probable digital deformity.  Presents with caregiver   ROS      Objective:  Physical Exam  Neurovascular status intact muscle strength adequate range of motion adequate interval growth lateral border left big toe painful and digital deformity digit to left with elevated toe and keratotic lesion at the proximal inner phalangeal joint     Assessment:  Ingrown toenail deformity left big toe lateral border painful along with digital deformity second left with lesion     Plan:  H&P both conditions reviewed and for hammertoe I did courtesy debridement of lesion I discussed offloading cushioning and the possibility that we may need to correct the deformity.  Educated him on Medical illustrator.  For the nail have recommended removal of the nail border patient wants this done I allowed him to read the consent form understanding risk and patient signed consent form.  I then infiltrated the left big toe 60 mg Xylocaine  Marcaine mixture sterile prep done using sterile instrumentation remove the lateral border exposed matrix applied phenol 3 applications 30 seconds followed by alcohol lavage sterile dressing gave instructions on soaks reappoint to recheck

## 2023-08-27 NOTE — Assessment & Plan Note (Signed)
 Pt with persistent symptoms, ok to continue miralax  daily, colace bid, and increased linzess  to 142 mcg qd

## 2023-08-27 NOTE — Patient Instructions (Signed)

## 2023-08-27 NOTE — Assessment & Plan Note (Signed)
 Cleared today, hearing improved,  to f/u any worsening symptoms or concerns

## 2023-09-07 ENCOUNTER — Ambulatory Visit: Payer: Self-pay

## 2023-09-07 NOTE — Telephone Encounter (Unsigned)
 Copied from CRM [id number]. Topic: Appointments - Scheduling Inquiry for Clinic >> Sep 07, 2023  8:49 AM Jasmin G wrote: Reason for CRM: Pt wants to schedule an appt with PCP for a needed shoot. Please call pt back ASAP, it's okay to leave a message.

## 2023-09-07 NOTE — Telephone Encounter (Signed)
 FYI Only or Action Required?: Action required by provider: clinical question for provider.  Patient was last seen in primary care on 08/25/2023 by [name] ORN, MD.  Called Nurse Triage reporting Animal Bite.  Symptoms began Sunday.  Interventions attempted: Nothing.  Symptoms are: stable.  Triage Disposition: See Physician Within 24 Hours  Patient/caregiver understands and will follow disposition?: No, wishes to speak with PCP    Copied from CRM [id number]. Topic: Clinical - Red Word Triage >> Sep 07, 2023 10:01 AM Evan Escobar wrote: Red Word that prompted transfer to Nurse Triage: patient bite on his right hand and by a dog yesterday about 24 hours ago, and was asking if he need a tetanus shot, it hurts to touch the the spot but he has not noticed any swelling yet  Pt num [phone] (M) Reason for Disposition  [1] Last tetanus shot > 5 years ago AND [2] any break in skin (e.g., cut, puncture or scratch)  Answer Assessment - Initial Assessment Questions 1. APPEARANCE What does it look like?  (e.g., abrasion, bruise, puncture)      unknown 2. SIZE: How big is the bite? (e.g., inches, cm; or compare to size of coin, pea, grape, ping pong ball)      no 3. LOCATION: Where is the bite located?      Right hand 4. ONSET: When did the bite happen? (e.g., minutes, hours ago)      Sunday 5. ANIMAL: What type of animal caused the bite? Is the injury from a bite or a claw? If the animal is a dog or a cat, ask: Was it a pet or a stray? Was it acting ill or behaving strangely?     dog 6. RABIES VACCINE: For dog or cat bites, ask: Do you know if the pet is vaccinated against rabies?  (e.g., yes, no, overdue for rabies shot, unknown)     unknown 7. CIRCUMSTANCES: Tell me how this happened.      na 8. TETANUS: When was your last tetanus booster?     unknown 9. PREGNANCY: Is there any chance you are pregnant? When was your last menstrual period?     Na  Pt wants to  know if he needs a tetnaus shot or not  Protocols used: Animal Bite-A-AH

## 2023-09-08 ENCOUNTER — Ambulatory Visit (HOSPITAL_COMMUNITY)
Admission: EM | Admit: 2023-09-08 | Discharge: 2023-09-08 | Disposition: A | Discharge status: Home / Self Care | Attending: Emergency Medicine | Admitting: Emergency Medicine

## 2023-09-08 ENCOUNTER — Encounter (HOSPITAL_COMMUNITY): Payer: Self-pay

## 2023-09-08 DIAGNOSIS — Z23 Encounter for immunization: Secondary | ICD-10-CM

## 2023-09-08 DIAGNOSIS — W540XXA Bitten by dog, initial encounter: Secondary | ICD-10-CM

## 2023-09-08 DIAGNOSIS — M79644 Pain in right finger(s): Secondary | ICD-10-CM | POA: Insufficient documentation

## 2023-09-08 MED ORDER — AMOXICILLIN-POT CLAVULANATE 875-125 MG PO TABS: 1.0000 | ORAL_TABLET | Freq: Two times a day (BID) | ORAL | 0 refills | Status: DC

## 2023-09-08 MED ORDER — TETANUS-DIPHTH-ACELL PERTUSSIS 5-2.5-18.5 LF-MCG/0.5 IM SUSY
PREFILLED_SYRINGE | INTRAMUSCULAR | Status: AC
  Filled 2023-09-08: qty 0.5

## 2023-09-08 MED ORDER — TETANUS-DIPHTH-ACELL PERTUSSIS 5-2.5-18.5 LF-MCG/0.5 IM SUSY
0.5000 mL | PREFILLED_SYRINGE | Freq: Once | INTRAMUSCULAR | Status: AC
  Administered 2023-09-08: 0.5 mL via INTRAMUSCULAR

## 2023-09-08 MED ORDER — MUPIROCIN 2 % EX OINT: 1.0000 | TOPICAL_OINTMENT | Freq: Two times a day (BID) | CUTANEOUS | 0 refills | Status: AC

## 2023-09-08 NOTE — ED Provider Notes (Signed)
 [facility]    CSN: [id number] Arrival date & time: 09/08/23  9075      History   Chief Complaint Chief Complaint  Patient presents with   Animal Bite    HPI Gracyn Santillanes is a 83 y.o. male.   83 year old male pt, [place], presents to urgent for evaluation of dog bite to right pointer finger that occurred 09/06/2023. Pt has used peroxide and neosporin. Pt is right handed tetanus needs updating.   Animal control paperwork was completed by pt and staff. Pt has FULL ROM of right hand/finger.  The history is provided by the patient. No language interpreter was used.    Past Medical History:  Diagnosis Date   Allergy    Arthritis    Cataract    Glaucoma    Hypertension    Normocytic anemia 09/25/2012   Polyp, sigmoid colon 11/05/2011   Seizure (HCC)    Seizures (HCC) 03/17/2013   Urinary incontinence 01/18/2023    Patient Active Problem List   Diagnosis Date Noted   Dog bite 09/08/2023   Need for tetanus booster 09/08/2023   Bilateral impacted cerumen 08/27/2023   Hx of completed stroke 03/27/2023   Urinary incontinence 01/18/2023   Prostate abscess 08/04/2022   Anemia of chronic disease 08/04/2022   History of recurrent UTIs 05/12/2022   Toenail fungus 07/30/2021   Deficiency anemia 10/13/2019   Cough 10/13/2019   Chronic idiopathic constipation 10/13/2019   Cervical radiculopathy 01/10/2019   Left lumbar radiculopathy 12/09/2017   Finger pain, right 10/06/2017   Hearing loss 09/09/2016   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   Encounter for general adult medical examination with abnormal findings 08/28/2015   Allergic rhinitis 02/26/2015   Glaucoma 05/07/2013   Essential hypertension 05/07/2013   Seizures (HCC) 03/17/2013    Past Surgical History:  Procedure Laterality Date   ANTERIOR CERVICAL DECOMP/DISCECTOMY FUSION N/A 05/22/2017   Procedure: Anterior Cervical Discectomy Fusion - Cervical Three-Cervical Four  - Cervical Four-Cervical Five;  Surgeon: Onetha Kuba, MD;  Location: [facility] OR;  Service: Neurosurgery;  Laterality: N/A;   CATARACT EXTRACTION, BILATERAL     EYE SURGERY     GLAUCOMA REPAIR  10 yrs ago   TRANSURETHRAL RESECTION OF PROSTATE N/A 08/06/2022   Procedure: TRANSURETHRAL RESECTION OF THE PROSTATE (TURP) UNROOFING PROSTATIC ABSCESS;  Surgeon: Watt Rush, MD;  Location: [facility] ORS;  Service: Urology;  Laterality: N/A;  1 HR FOR CASE       Home Medications    Prior to Admission medications   Medication Sig Start Date End Date Taking? Authorizing Provider  amoxicillin -clavulanate (AUGMENTIN ) 875-125 MG tablet Take 1 tablet by mouth every 12 (twelve) hours. 09/08/23  Yes Asya Derryberry, NP  mupirocin  ointment (BACTROBAN ) 2 % Apply 1 Application topically 2 (two) times daily for 7 days. Right finger 09/08/23 09/15/23 Yes Hiliana Eilts, Rilla, NP  acetaminophen  (TYLENOL ) 500 MG tablet Take 500 mg by mouth every 6 (six) hours as needed for moderate pain or headache.     [provider]  aspirin  81 MG tablet Take 81 mg by mouth daily.    [provider]  brimonidine (ALPHAGAN P) 0.1 % SOLN Place 1 drop into both eyes 2 (two) times daily.    [provider]  cycloSPORINE  (RESTASIS ) 0.05 % ophthalmic emulsion Place 1 drop into both eyes 2 (two) times daily.    [provider]  latanoprost  (XALATAN ) 0.005 % ophthalmic solution Place 1 drop into both eyes at  bedtime. 09/20/12   [provider]  levETIRAcetam  (KEPPRA ) 250 MG tablet Take 1 tablet (250 mg total) by mouth 2 (two) times daily. APPOINTMENT NEEDED FOR FURTHER REFILLS 06/04/23   Onita Duos, MD  linaclotide  (LINZESS ) 145 MCG CAPS capsule Take 1 capsule (145 mcg total) by mouth daily before breakfast. 08/25/23   [name] ORN, MD  lisinopril  (ZESTRIL ) 20 MG tablet TAKE 1 TABLET(20 MG) BY MOUTH DAILY 05/08/23   Rollene Almarie LABOR, MD  magnesium  hydroxide (MILK OF MAGNESIA) 400 MG/5ML suspension Take 30 mLs  by mouth daily as needed for mild constipation.    [provider]  methocarbamol  (ROBAXIN ) 500 MG tablet Take 1 tablet (500 mg total) by mouth every 8 (eight) hours as needed for muscle spasms. 03/26/23   Rollene Almarie LABOR, MD  Multiple Vitamin (MULTIVITAMIN) tablet Take 1 tablet by mouth daily.    [provider]  polyethylene glycol (MIRALAX ) 17 g packet Take 17 g by mouth daily. 11/15/22   Theadore Ozell HERO, MD  tamsulosin  (FLOMAX ) 0.4 MG CAPS capsule TAKE 1 CAPSULE(0.4 MG) BY MOUTH IN THE MORNING AND AT BEDTIME 05/19/23   Rollene Almarie LABOR, MD    Family History Family History  Problem Relation Age of Onset   Stroke Mother    High blood pressure Mother    Stroke Father    High blood pressure Father    Colon cancer Brother    Esophageal cancer Neg Hx    Pancreatic cancer Neg Hx    Rectal cancer Neg Hx    Stomach cancer Neg Hx     Social History Social History   Tobacco Use   Smoking status: Former    Current packs/day: 0.00    Types: Cigarettes    Quit date: 02/18/2007    Years since quitting: 16.5   Smokeless tobacco: Never   Tobacco comments:    not sure but a while ago; quit smoking and ETOH  Vaping Use   Vaping status: Never Used  Substance Use Topics   Alcohol use: No    Alcohol/week: 0.0 standard drinks of alcohol    Comment: quit in 2009   Drug use: No     Allergies   Patient has no known allergies.   Review of Systems Review of Systems  Constitutional:  Negative for fever.  Skin:  Positive for wound.  All other systems reviewed and are negative.    Physical Exam Triage Vital Signs ED Triage Vitals  Encounter Vitals Group     BP 09/08/23 0957 (!) 150/69     Girls Systolic BP Percentile --      Girls Diastolic BP Percentile --      Boys Systolic BP Percentile --      Boys Diastolic BP Percentile --      Pulse Rate 09/08/23 0957 64     Resp 09/08/23 0957 18     Temp 09/08/23 0957 98 F (36.7 C)     Temp Source 09/08/23 0957  Oral     SpO2 09/08/23 0957 98 %     Weight --      Height --      Head Circumference --      Peak Flow --      Pain Score 09/08/23 0958 0     Pain Loc --      Pain Education --      Exclude from Growth Chart --    No data found.  Updated Vital Signs BP ROLLEN)  150/69 (BP Location: Right Arm)   Pulse 64   Temp 98 F (36.7 C) (Oral)   Resp 18   SpO2 98%   Visual Acuity Right Eye Distance:   Left Eye Distance:   Bilateral Distance:    Right Eye Near:   Left Eye Near:    Bilateral Near:     Physical Exam Vitals and nursing note reviewed.  Constitutional:      General: He is not in acute distress.    Appearance: He is well-developed.  HENT:     Head: Normocephalic and atraumatic.  Eyes:     Conjunctiva/sclera: Conjunctivae normal.  Cardiovascular:     Rate and Rhythm: Normal rate and regular rhythm.     Heart sounds: No murmur heard. Pulmonary:     Effort: Pulmonary effort is normal. No respiratory distress.     Breath sounds: Normal breath sounds.  Abdominal:     Palpations: Abdomen is soft.     Tenderness: There is no abdominal tenderness.  Musculoskeletal:        General: No swelling.     Cervical back: Neck supple.  Skin:    General: Skin is warm and dry.     Capillary Refill: Capillary refill takes less than 2 seconds.     Findings: Wound present.     Comments: Right index finger at base of finger 2 small puncture wounds, no purulent drainage, +TTP. NV intact, handgrips equal bilaterally  Neurological:     General: No focal deficit present.     Mental Status: He is alert and oriented to person, place, and time.     GCS: GCS eye subscore is 4. GCS verbal subscore is 5. GCS motor subscore is 6.  Psychiatric:        Attention and Perception: Attention normal.        Mood and Affect: Mood normal.        Speech: Speech normal.        Behavior: Behavior normal.      UC Treatments / Results  Labs (all labs ordered are listed, but only abnormal results are  displayed) Labs Reviewed - No data to display  EKG   Radiology No results found.  Procedures Procedures (including critical care time)  Medications Ordered in UC Medications  Tdap (BOOSTRIX ) injection 0.5 mL (0.5 mLs Intramuscular Given 09/08/23 1025)    Initial Impression / Assessment and Plan / UC Course  I have reviewed the triage vital signs and the nursing notes.  Pertinent labs & imaging results that were available during my care of the patient were reviewed by me and considered in my medical decision making (see chart for details).  Clinical Course as of 09/08/23 1028  Tue Sep 08, 2023  1018 Tdap ordered as tetanus is not UTD. Discussed exam findings and plan of care with pt, will treat with Augmentin  and mupirocin , pt given strict go to Er precautions.  Patient verbalized understanding to this provider.   [JD]    Clinical Course User Index [JD] Samadhi Mahurin, Rilla, NP   Discussed exam findings and plan of care with patient, strict go to ER precautions given.   Patient verbalized understanding to this provider.  Ddx: Dog bite,finger pain, need for tetanus booster, cellulitis Final Clinical Impressions(s) / UC Diagnoses   Final diagnoses:  Dog bite, initial encounter  Finger pain, right  Need for tetanus booster     Discharge Instructions      We updated your tetanus shot in office  today Take antibiotic as directed Daily dressing changes with mupirocin  Follow up with PCP in 3 days for wound check, sooner if worse If you develop fever, unable to move finger, unable to keep meds down, purulent drainage,etc, go immediately to ER for further evaluation.     ED Prescriptions     Medication Sig Dispense Auth. Provider   amoxicillin -clavulanate (AUGMENTIN ) 875-125 MG tablet Take 1 tablet by mouth every 12 (twelve) hours. 14 tablet Dorthie Santini, NP   mupirocin  ointment (BACTROBAN ) 2 % Apply 1 Application topically 2 (two) times daily for 7 days. Right  finger 15 g Adryana Mogensen, NP      PDMP not reviewed this encounter.   Aminta Loose, NP 09/08/23 1028

## 2023-09-08 NOTE — Telephone Encounter (Signed)
 Patient was at ed today for a total of I have called to check in on the patient most likely will need a ED follow up when he does call back

## 2023-09-08 NOTE — ED Triage Notes (Signed)
 Pt states bit by his sons dog Sunday morning. All dog shots are up to date. 2 small puncture wounds noted to rt pointer finger. States unsure if his tetanus is up to date.

## 2023-09-08 NOTE — Telephone Encounter (Signed)
 Patient contacted and schedule for Thursday 7/24 @ 1120 acute with CANDIE Ku.

## 2023-09-08 NOTE — Discharge Instructions (Addendum)
 We updated your tetanus shot in office today Take antibiotic as directed Daily dressing changes with mupirocin  Follow up with PCP in 3 days for wound check, sooner if worse If you develop fever, unable to move finger, unable to keep meds down, purulent drainage,etc, go immediately to ER for further evaluation.

## 2023-09-08 NOTE — Telephone Encounter (Unsigned)
 Copied from CRM [id number]. Topic: Appointments - Scheduling Inquiry for Clinic >> Sep 08, 2023 12:19 PM [name] I wrote: Reason for CRM: Patient needs follow up for dog bite in the next 3 days per urgent care but there are no availabilities until August 6th. Please call patient to schedule

## 2023-09-10 ENCOUNTER — Ambulatory Visit: Admitting: Family Medicine

## 2023-09-16 ENCOUNTER — Telehealth: Payer: Self-pay | Admitting: Neurology

## 2023-09-16 NOTE — Telephone Encounter (Signed)
 Pt is calling to f/u on if Dr Onita still plans to take him off of levETIRAcetam  (KEPPRA ) 250 MG tablet , please advise.

## 2023-09-22 DIAGNOSIS — H401133 Primary open-angle glaucoma, bilateral, severe stage: Secondary | ICD-10-CM | POA: Diagnosis not present

## 2023-09-22 DIAGNOSIS — H3562 Retinal hemorrhage, left eye: Secondary | ICD-10-CM | POA: Diagnosis not present

## 2023-09-22 DIAGNOSIS — H348312 Tributary (branch) retinal vein occlusion, right eye, stable: Secondary | ICD-10-CM | POA: Diagnosis not present

## 2023-09-22 DIAGNOSIS — H16223 Keratoconjunctivitis sicca, not specified as Sjogren's, bilateral: Secondary | ICD-10-CM | POA: Diagnosis not present

## 2023-09-24 ENCOUNTER — Ambulatory Visit (HOSPITAL_COMMUNITY)
Admission: EM | Admit: 2023-09-24 | Discharge: 2023-09-24 | Disposition: A | Discharge status: Home / Self Care | Attending: Family Medicine | Admitting: Family Medicine

## 2023-09-24 ENCOUNTER — Other Ambulatory Visit: Payer: Self-pay

## 2023-09-24 ENCOUNTER — Encounter (HOSPITAL_COMMUNITY): Payer: Self-pay | Admitting: Emergency Medicine

## 2023-09-24 DIAGNOSIS — R051 Acute cough: Secondary | ICD-10-CM | POA: Insufficient documentation

## 2023-09-24 DIAGNOSIS — R252 Cramp and spasm: Secondary | ICD-10-CM | POA: Diagnosis not present

## 2023-09-24 LAB — COMPREHENSIVE METABOLIC PANEL WITH GFR
ALT: 15 U/L (ref 0–44)
AST: 20 U/L (ref 15–41)
Albumin: 3.6 g/dL (ref 3.5–5.0)
Alkaline Phosphatase: 29 U/L — ABNORMAL LOW (ref 38–126)
Anion gap: 9 (ref 5–15)
BUN: 12 mg/dL (ref 8–23)
CO2: 24 mmol/L (ref 22–32)
Calcium: 9.1 mg/dL (ref 8.9–10.3)
Chloride: 98 mmol/L (ref 98–111)
Creatinine, Ser: 1.09 mg/dL (ref 0.61–1.24)
GFR, Estimated: >60 mL/min (ref >60)
Glucose, Bld: 91 mg/dL (ref 70–99)
Potassium: 4.1 mmol/L (ref 3.5–5.1)
Sodium: 131 mmol/L — ABNORMAL LOW (ref 135–145)
Total Bilirubin: 0.6 mg/dL (ref 0.0–1.2)
Total Protein: 7.2 g/dL (ref 6.5–8.1)

## 2023-09-24 LAB — CBC WITH DIFFERENTIAL/PLATELET
Abs Immature Granulocytes: 0.04 K/uL (ref 0.00–0.07)
Basophils Absolute: 0 K/uL (ref 0.0–0.1)
Basophils Relative: 0 %
Eosinophils Absolute: 0 K/uL (ref 0.0–0.5)
Eosinophils Relative: 0 %
HCT: 27.9 % — ABNORMAL LOW (ref 39.0–52.0)
Hemoglobin: 9.1 g/dL — ABNORMAL LOW (ref 13.0–17.0)
Immature Granulocytes: 1 %
Lymphocytes Relative: 17 %
Lymphs Abs: 0.8 K/uL (ref 0.7–4.0)
MCH: 28.4 pg (ref 26.0–34.0)
MCHC: 32.6 g/dL (ref 30.0–36.0)
MCV: 87.2 fL (ref 80.0–100.0)
Monocytes Absolute: 0.9 K/uL (ref 0.1–1.0)
Monocytes Relative: 20 %
Neutro Abs: 2.8 K/uL (ref 1.7–7.7)
Neutrophils Relative %: 62 %
Platelets: 152 K/uL (ref 150–400)
RBC: 3.2 MIL/uL — ABNORMAL LOW (ref 4.22–5.81)
RDW: 14.9 % (ref 11.5–15.5)
WBC: 4.5 K/uL (ref 4.0–10.5)
nRBC: 0 % (ref 0.0–0.2)

## 2023-09-24 LAB — MAGNESIUM: Magnesium: 1.7 mg/dL (ref 1.7–2.4)

## 2023-09-24 MED ORDER — BENZONATATE 100 MG PO CAPS: 100.0000 mg | ORAL_CAPSULE | Freq: Three times a day (TID) | ORAL | 0 refills | Status: DC

## 2023-09-24 NOTE — ED Triage Notes (Signed)
 Patient reports leg cramps for a while very non-specific.  When asked about any other concerns, reported a cough that started yesterday.  Patient also indicates he received a flu shot yesterday

## 2023-09-24 NOTE — Discharge Instructions (Addendum)
 You were seen today for various issues.  I do not see evidence of bacterial infection in regards to your cough.  I have sent out a medication to help with cough.  If you have worsening cough, or develop fevers, chills, or shortness of breath, then please return for re-evaluation.   For you leg cramping I have ordered blood work.  This will be resulted later today or tomorrow.  If there is anything abnormal you will be notified.  I do recommend you follow up with your primary care provider for further discussion.

## 2023-09-24 NOTE — ED Provider Notes (Signed)
 [facility]    CSN: [id number] Arrival date & time: 09/24/23  9076      History   Chief Complaint Chief Complaint  Patient presents with   leg cramps   Cough    HPI Evan Escobar is a 83 y.o. male.    Cough Associated symptoms: myalgias    Patient is here for several issues.  He started with a cough yesterday.  It kept him up last night.  No wheezing or sob.  No known fevers.  He did buy cough drops without much help.  No sick contacts.  He also has leg cramping at times.  This has been going on for a long time.  Cramping can be at the posterior thighs or calves.  This comes/goes.  He thinks b/c of arthritis.  He thinks he sees his pcp later this month.  Last labs drawn 12/2022.        Past Medical History:  Diagnosis Date   Allergy    Arthritis    Cataract    Glaucoma    Hypertension    Normocytic anemia 09/25/2012   Polyp, sigmoid colon 11/05/2011   Seizure (HCC)    Seizures (HCC) 03/17/2013   Urinary incontinence 01/18/2023    Patient Active Problem List   Diagnosis Date Noted   Dog bite 09/08/2023   Need for tetanus booster 09/08/2023   Bilateral impacted cerumen 08/27/2023   Hx of completed stroke 03/27/2023   Urinary incontinence 01/18/2023   Prostate abscess 08/04/2022   Anemia of chronic disease 08/04/2022   History of recurrent UTIs 05/12/2022   Toenail fungus 07/30/2021   Deficiency anemia 10/13/2019   Cough 10/13/2019   Chronic idiopathic constipation 10/13/2019   Cervical radiculopathy 01/10/2019   Left lumbar radiculopathy 12/09/2017   Finger pain, right 10/06/2017   Hearing loss 09/09/2016   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   Encounter for general adult medical examination with abnormal findings 08/28/2015   Allergic rhinitis 02/26/2015   Glaucoma 05/07/2013   Essential hypertension 05/07/2013   Seizures (HCC) 03/17/2013    Past Surgical History:  Procedure Laterality Date   ANTERIOR  CERVICAL DECOMP/DISCECTOMY FUSION N/A 05/22/2017   Procedure: Anterior Cervical Discectomy Fusion - Cervical Three-Cervical Four - Cervical Four-Cervical Five;  Surgeon: Onetha Kuba, MD;  Location: [facility] OR;  Service: Neurosurgery;  Laterality: N/A;   CATARACT EXTRACTION, BILATERAL     EYE SURGERY     GLAUCOMA REPAIR  10 yrs ago   TRANSURETHRAL RESECTION OF PROSTATE N/A 08/06/2022   Procedure: TRANSURETHRAL RESECTION OF THE PROSTATE (TURP) UNROOFING PROSTATIC ABSCESS;  Surgeon: Watt Rush, MD;  Location: [facility];  Service: Urology;  Laterality: N/A;  1 HR FOR CASE       Home Medications    Prior to Admission medications   Medication Sig Start Date End Date Taking? Authorizing Provider  acetaminophen  (TYLENOL ) 500 MG tablet Take 500 mg by mouth every 6 (six) hours as needed for moderate pain or headache.     [provider]  amoxicillin -clavulanate (AUGMENTIN ) 875-125 MG tablet Take 1 tablet by mouth every 12 (twelve) hours. 09/08/23   Defelice, Rilla, NP  aspirin  81 MG tablet Take 81 mg by mouth daily.    [provider]  brimonidine (ALPHAGAN P) 0.1 % SOLN Place 1 drop into both eyes 2 (two) times daily.    [provider]  cycloSPORINE  (RESTASIS ) 0.05 % ophthalmic emulsion Place 1 drop into both eyes 2 (two) times daily.  [provider]  latanoprost  (XALATAN ) 0.005 % ophthalmic solution Place 1 drop into both eyes at bedtime. 09/20/12   [provider]  levETIRAcetam  (KEPPRA ) 250 MG tablet Take 1 tablet (250 mg total) by mouth 2 (two) times daily. APPOINTMENT NEEDED FOR FURTHER REFILLS 06/04/23   Onita Duos, MD  linaclotide  (LINZESS ) 145 MCG CAPS capsule Take 1 capsule (145 mcg total) by mouth daily before breakfast. 08/25/23   [name] ORN, MD  lisinopril  (ZESTRIL ) 20 MG tablet TAKE 1 TABLET(20 MG) BY MOUTH DAILY 05/08/23   Rollene Almarie LABOR, MD  magnesium  hydroxide (MILK OF MAGNESIA) 400 MG/5ML suspension Take 30 mLs by mouth daily as needed for  mild constipation.    [provider]  methocarbamol  (ROBAXIN ) 500 MG tablet Take 1 tablet (500 mg total) by mouth every 8 (eight) hours as needed for muscle spasms. 03/26/23   Rollene Almarie LABOR, MD  Multiple Vitamin (MULTIVITAMIN) tablet Take 1 tablet by mouth daily.    [provider]  polyethylene glycol (MIRALAX ) 17 g packet Take 17 g by mouth daily. 11/15/22   Theadore Ozell HERO, MD  tamsulosin  (FLOMAX ) 0.4 MG CAPS capsule TAKE 1 CAPSULE(0.4 MG) BY MOUTH IN THE MORNING AND AT BEDTIME 05/19/23   Rollene Almarie LABOR, MD    Family History Family History  Problem Relation Age of Onset   Stroke Mother    High blood pressure Mother    Stroke Father    High blood pressure Father    Colon cancer Brother    Esophageal cancer Neg Hx    Pancreatic cancer Neg Hx    Rectal cancer Neg Hx    Stomach cancer Neg Hx     Social History Social History   Tobacco Use   Smoking status: Former    Current packs/day: 0.00    Types: Cigarettes    Quit date: 02/18/2007    Years since quitting: 16.6   Smokeless tobacco: Never   Tobacco comments:    not sure but a while ago; quit smoking and ETOH  Vaping Use   Vaping status: Never Used  Substance Use Topics   Alcohol use: No    Alcohol/week: 0.0 standard drinks of alcohol    Comment: quit in 2009   Drug use: No     Allergies   Patient has no known allergies.   Review of Systems Review of Systems  Constitutional: Negative.   HENT: Negative.    Respiratory:  Positive for cough.   Cardiovascular: Negative.   Gastrointestinal: Negative.   Genitourinary: Negative.   Musculoskeletal:  Positive for myalgias.     Physical Exam Triage Vital Signs ED Triage Vitals  Encounter Vitals Group     BP 09/24/23 0953 (!) 149/63     Girls Systolic BP Percentile --      Girls Diastolic BP Percentile --      Boys Systolic BP Percentile --      Boys Diastolic BP Percentile --      Pulse Rate 09/24/23 0953 78     Resp 09/24/23 0953  (!) 21     Temp 09/24/23 0953 98.3 F (36.8 C)     Temp Source 09/24/23 0953 Oral     SpO2 09/24/23 0953 98 %     Weight --      Height --      Head Circumference --      Peak Flow --      Pain Score 09/24/23 0949 5  Pain Loc --      Pain Education --      Exclude from Growth Chart --    No data found.  Updated Vital Signs BP (!) 149/63 (BP Location: Left Arm)   Pulse 78   Temp 98.3 F (36.8 C) (Oral)   Resp (!) 21   SpO2 98%   Visual Acuity Right Eye Distance:   Left Eye Distance:   Bilateral Distance:    Right Eye Near:   Left Eye Near:    Bilateral Near:     Physical Exam Constitutional:      General: He is not in acute distress.    Appearance: Normal appearance. He is normal weight. He is not ill-appearing or toxic-appearing.  HENT:     Nose: Nose normal. No congestion or rhinorrhea.     Mouth/Throat:     Mouth: Mucous membranes are moist.     Pharynx: No oropharyngeal exudate.  Cardiovascular:     Rate and Rhythm: Normal rate and regular rhythm.  Pulmonary:     Effort: Pulmonary effort is normal.     Breath sounds: Normal breath sounds. No wheezing, rhonchi or rales.  Musculoskeletal:     Cervical back: Normal range of motion and neck supple. No tenderness.  Skin:    General: Skin is warm.  Neurological:     General: No focal deficit present.     Mental Status: He is alert.  Psychiatric:        Mood and Affect: Mood normal.      UC Treatments / Results  Labs (all labs ordered are listed, but only abnormal results are displayed) Labs Reviewed  COMPREHENSIVE METABOLIC PANEL WITH GFR  CBC WITH DIFFERENTIAL/PLATELET  MAGNESIUM     EKG   Radiology No results found.  Procedures Procedures (including critical care time)  Medications Ordered in UC Medications - No data to display  Initial Impression / Assessment and Plan / UC Course  I have reviewed the triage vital signs and the nursing notes.  Pertinent labs & imaging results that  were available during my care of the patient were reviewed by me and considered in my medical decision making (see chart for details).  Patient was seen today for several issues.  He started with a cough yesterday.  No fevers, chills, sob or wheezing.  Exam is normal.  At this time will treat with tessalon  as no signs of bacterial infection or concern  for covid.  Muscle spasms/cramping likely has been going on for a long period of time.  I will order blood work as this has not been done for some time.  He should follow up with his pcp for further discussion.   Final Clinical Impressions(s) / UC Diagnoses   Final diagnoses:  Acute cough  Muscle cramping     Discharge Instructions      You were seen today for various issues.  I do not see evidence of bacterial infection in regards to your cough.  I have sent out a medication to help with cough.  If you have worsening cough, or develop fevers, chills, or shortness of breath, then please return for re-evaluation.   For you leg cramping I have ordered blood work.  This will be resulted later today or tomorrow.  If there is anything abnormal you will be notified.  I do recommend you follow up with your primary care provider for further discussion.      ED Prescriptions     Medication Sig  Dispense Auth. Provider   benzonatate  (TESSALON ) 100 MG capsule Take 1 capsule (100 mg total) by mouth every 8 (eight) hours. 21 capsule Darral Longs, MD      PDMP not reviewed this encounter.   Darral Longs, MD 09/24/23 1028

## 2023-09-25 ENCOUNTER — Ambulatory Visit (HOSPITAL_COMMUNITY): Payer: Self-pay

## 2023-09-28 ENCOUNTER — Ambulatory Visit: Payer: Self-pay | Admitting: Emergency Medicine

## 2023-09-28 ENCOUNTER — Ambulatory Visit (INDEPENDENT_AMBULATORY_CARE_PROVIDER_SITE_OTHER)

## 2023-09-28 ENCOUNTER — Ambulatory Visit

## 2023-09-28 ENCOUNTER — Ambulatory Visit (INDEPENDENT_AMBULATORY_CARE_PROVIDER_SITE_OTHER): Admitting: Emergency Medicine

## 2023-09-28 ENCOUNTER — Encounter: Payer: Self-pay | Admitting: Emergency Medicine

## 2023-09-28 VITALS — BP 148/78 | HR 64 | Temp 98.2°F | Ht 65.6 in | Wt 135.0 lb

## 2023-09-28 VITALS — BP 170/68 | HR 64 | Ht 66.5 in | Wt 135.6 lb

## 2023-09-28 DIAGNOSIS — Z0000 Encounter for general adult medical examination without abnormal findings: Secondary | ICD-10-CM

## 2023-09-28 DIAGNOSIS — R0989 Other specified symptoms and signs involving the circulatory and respiratory systems: Secondary | ICD-10-CM

## 2023-09-28 DIAGNOSIS — R053 Chronic cough: Secondary | ICD-10-CM | POA: Diagnosis not present

## 2023-09-28 DIAGNOSIS — J22 Unspecified acute lower respiratory infection: Secondary | ICD-10-CM

## 2023-09-28 DIAGNOSIS — R6889 Other general symptoms and signs: Secondary | ICD-10-CM | POA: Diagnosis not present

## 2023-09-28 DIAGNOSIS — U071 COVID-19: Secondary | ICD-10-CM | POA: Insufficient documentation

## 2023-09-28 LAB — CBC WITH DIFFERENTIAL/PLATELET
Basophils Absolute: 0 K/uL (ref 0.0–0.1)
Basophils Relative: 0.4 % (ref 0.0–3.0)
Eosinophils Absolute: 0 K/uL (ref 0.0–0.7)
Eosinophils Relative: 1 % (ref 0.0–5.0)
HCT: 27.6 % — ABNORMAL LOW (ref 39.0–52.0)
Hemoglobin: 9.3 g/dL — ABNORMAL LOW (ref 13.0–17.0)
Lymphocytes Relative: 57.1 % — ABNORMAL HIGH (ref 12.0–46.0)
Lymphs Abs: 1.6 K/uL (ref 0.7–4.0)
MCHC: 33.7 g/dL (ref 30.0–36.0)
MCV: 84.5 fl (ref 78.0–100.0)
Monocytes Absolute: 0.4 K/uL (ref 0.1–1.0)
Monocytes Relative: 14 % — ABNORMAL HIGH (ref 3.0–12.0)
Neutro Abs: 0.8 K/uL — ABNORMAL LOW (ref 1.4–7.7)
Neutrophils Relative %: 27.5 % — ABNORMAL LOW (ref 43.0–77.0)
Platelets: 136 K/uL — ABNORMAL LOW (ref 150.0–400.0)
RBC: 3.27 Mil/uL — ABNORMAL LOW (ref 4.22–5.81)
RDW: 15.6 % — ABNORMAL HIGH (ref 11.5–15.5)
WBC: 2.8 K/uL — ABNORMAL LOW (ref 4.0–10.5)

## 2023-09-28 LAB — COMPREHENSIVE METABOLIC PANEL WITH GFR
ALT: 12 U/L (ref 0–53)
AST: 16 U/L (ref 0–37)
Albumin: 4.1 g/dL (ref 3.5–5.2)
Alkaline Phosphatase: 29 U/L — ABNORMAL LOW (ref 39–117)
BUN: 13 mg/dL (ref 6–23)
CO2: 27 meq/L (ref 19–32)
Calcium: 9.1 mg/dL (ref 8.4–10.5)
Chloride: 96 meq/L (ref 96–112)
Creatinine, Ser: 0.91 mg/dL (ref 0.40–1.50)
GFR: 78.67 mL/min (ref >60.00)
Glucose, Bld: 89 mg/dL (ref 70–99)
Potassium: 4.3 meq/L (ref 3.5–5.1)
Sodium: 131 meq/L — ABNORMAL LOW (ref 135–145)
Total Bilirubin: 0.4 mg/dL (ref 0.2–1.2)
Total Protein: 7.4 g/dL (ref 6.0–8.3)

## 2023-09-28 LAB — POC COVID19 BINAXNOW: SARS Coronavirus 2 Ag: POSITIVE — AB

## 2023-09-28 LAB — POCT INFLUENZA A/B
Influenza A, POC: NEGATIVE
Influenza B, POC: NEGATIVE

## 2023-09-28 MED ORDER — AZITHROMYCIN 250 MG PO TABS: ORAL_TABLET | ORAL | 0 refills | Status: DC

## 2023-09-28 NOTE — Assessment & Plan Note (Signed)
 Almost 3 weeks into illness May have secondary bacterial infection Symptom management discussed

## 2023-09-28 NOTE — Assessment & Plan Note (Signed)
 COVID infection now with secondary bacterial infection Clinically stable.  No clinical findings of pneumonia Recommend chest x-ray.  Will review images when available Recommend daily azithromycin  for 5 days ED precautions given Symptom management discussed Advised to rest and stay well-hydrated Advised to contact the office if no better or worse during the next several days.

## 2023-09-28 NOTE — Patient Instructions (Signed)
 COVID-19: What to Know COVID-19 is an infection caused by a virus called SARS-CoV-2. This type of virus is called a coronavirus. People with COVID-19 may: Have few to no symptoms. Have mild to moderate symptoms that affect their lungs and breathing. Get very sick. What are the causes?  COVID-19 is caused by a virus. This virus may be in the air as droplets or on surfaces. It can spread from an infected person when they cough, sneeze, speak, sing, or breathe. You may become infected if: You breathe in the infected droplets in the air. You touch an object that has the virus on it. What increases the risk? You are at risk of getting COVID-19 if you have been around someone with the infection. You may be more likely to get very sick if: You are 57 years old or older. You have certain medical conditions, such as: Heart disease. Diabetes. Long-term respiratory disease. Cancer. Pregnancy. You are immunocompromised. This means your body can't fight infections easily. You have a disability that makes it hard for you to move around, you have trouble moving, or you can't move at all. What are the signs or symptoms? People may have different symptoms from COVID-19. The symptoms can also be mild to very bad. They often show up in 5-6 days after being infected. But, they can take up to 14 days to appear. Common symptoms are: Cough. Feeling tired. New loss of taste or smell. Fever. Less common symptoms are: Sore throat. Headache. Body or muscle aches. Diarrhea. A skin rash or fingers or toes that are a different color than usual. Red or irritated eyes. Sometimes, COVID-19 does not cause symptoms. How is this diagnosed? COVID-19 can be diagnosed with tests done in the lab or at home. Fluid from your nose, mouth, or lungs will be used to check for the virus. How is this treated? Treatment for COVID-19 depends on how sick you are. Mild symptoms can be treated at home with rest, fluids, and  over-the-counter medicines. very bad symptoms may be treated in a hospital intensive care unit (ICU). If you have symptoms and are at risk of getting very sick, you may be given a medicine that fights viruses. This medicine is called an antiviral. How is this prevented? To protect yourself from COVID-19: Know your risk factors. Get vaccinated. If your body can't fight infections easily, talk to your provider about treatment to help prevent COVID-19. Stay at least about 3 feet (1 meter) away from other people. Wear mask that fits well when: You can't stay at a distance from people. You're in a place with not a lot of air flow. Try to be in open spaces with good air flow when you are in public. Wash your hands often or use an alcohol-based hand sanitizer. Cover your nose and mouth when you cough or sneeze. If you think you have COVID-19 or have been around someone who has it, stay home and away from other people as told by your provider or health officials. Where to find more information To learn more: Go to [employer].no Click Health Topics. Type COVID-19 in the search box. Go to VisitDestination.com.br Click Health Topics. Then click All Topics. Type COVID-19 in the search box. Get help right away if: You have trouble breathing or get short of breath. You have pain or pressure in your chest. You're feeling confused. These symptoms may be an emergency. Get help right away. Call 911. Do not wait to see if the symptoms will go away.  Do not drive yourself to the hospital. This information is not intended to replace advice given to you by your health care provider. Make sure you discuss any questions you have with your health care provider. Document Revised: 11/06/2022 Document Reviewed: 10/29/2022 Elsevier Patient Education  2025 [employer].

## 2023-09-28 NOTE — Patient Instructions (Signed)
 Evan Escobar , Thank you for taking time out of your busy schedule to complete your Annual Wellness Visit with me. I enjoyed our conversation and look forward to speaking with you again next year. I, as well as your care team,  appreciate your ongoing commitment to your health goals. Please review the following plan we discussed and let me know if I can assist you in the future. Your Game plan/ To Do List    Referrals: If you haven't heard from the office you've been referred to, please reach out to them at the phone provided.   Follow up Visits: We will see or speak with you next year for your Next Medicare AWV with our clinical staff Have you seen your provider in the last 6 months (3 months if uncontrolled diabetes)? No  Clinician Recommendations:  Aim for 30 minutes of exercise or brisk walking, 6-8 glasses of water, and 5 servings of fruits and vegetables each day. Educated and advised on getting the Shingles vaccines in 2025.      This is a list of the screenings recommended for you:  Health Maintenance  Topic Date Due   Zoster (Shingles) Vaccine (1 of 2) Never done   COVID-19 Vaccine (5 - 2024-25 season) 04/26/2023   Flu Shot  09/18/2023   Medicare Annual Wellness Visit  09/27/2024   DTaP/Tdap/Td vaccine (3 - Td or Tdap) 09/07/2033   Pneumococcal Vaccine for age over 6  Completed   Hepatitis B Vaccine  Aged Out   HPV Vaccine  Aged Out   Meningitis B Vaccine  Aged Out    Advanced directives: (Declined) Advance directive discussed with you today. Even though you declined this today, please call our office should you change your mind, and we can give you the proper paperwork for you to fill out. Advance Care Planning is important because it:  [x]  Makes sure you receive the medical care that is consistent with your values, goals, and preferences  [x]  It provides guidance to your family and loved ones and reduces their decisional burden about whether or not they are making the  right decisions based on your wishes.  Follow the link provided in your after visit summary or read over the paperwork we have mailed to you to help you started getting your Advance Directives in place. If you need assistance in completing these, please reach out to us  so that we can help you!  S

## 2023-09-28 NOTE — Progress Notes (Signed)
 Subjective:   Evan Escobar is a 83 y.o. who presents for a Medicare Wellness preventive visit.  As a reminder, Annual Wellness Visits don't include a physical exam, and some assessments may be limited, especially if this visit is performed virtually. We may recommend an in-person follow-up visit with your provider if needed.  Visit Complete: In person  Persons Participating in Visit: Patient.  AWV Questionnaire: No: Patient Medicare AWV questionnaire was not completed prior to this visit.  Cardiac Risk Factors include: advanced age (>35men, >40 women);male gender;hypertension     Objective:    Today's Vitals   09/28/23 0809 09/28/23 0821  BP: (!) 172/80 (!) 170/68  Pulse: 64   SpO2: 99%   Weight: 135 lb 9.6 oz (61.5 kg)   Height: 5' 6.5 (1.689 m)    Body mass index is 21.56 kg/m.     09/28/2023    8:08 AM 11/14/2022   11:45 PM 09/25/2022    2:45 PM 08/04/2022   11:00 PM 08/04/2022    2:20 PM 05/22/2017    3:15 PM 05/13/2017    8:48 AM  Advanced Directives  Does Patient Have a Medical Advance Directive? No No Yes No No No    Type of Surveyor, minerals;Living will      Copy of Healthcare Power of Attorney in Chart?   No - copy requested      Would patient like information on creating a medical advance directive? No - Patient declined   No - Patient declined  No - Patient declined  No - Patient declined      Data saved with a previous flowsheet row definition    Current Medications (verified) Outpatient Encounter Medications as of 09/28/2023  Medication Sig   acetaminophen  (TYLENOL ) 500 MG tablet Take 500 mg by mouth every 6 (six) hours as needed for moderate pain or headache.    aspirin  81 MG tablet Take 81 mg by mouth daily.   benzonatate  (TESSALON ) 100 MG capsule Take 1 capsule (100 mg total) by mouth every 8 (eight) hours.   brimonidine (ALPHAGAN P) 0.1 % SOLN Place 1 drop into both eyes 2 (two) times daily.   cycloSPORINE  (RESTASIS )  0.05 % ophthalmic emulsion Place 1 drop into both eyes 2 (two) times daily.   latanoprost  (XALATAN ) 0.005 % ophthalmic solution Place 1 drop into both eyes at bedtime.   levETIRAcetam  (KEPPRA ) 250 MG tablet Take 1 tablet (250 mg total) by mouth 2 (two) times daily. APPOINTMENT NEEDED FOR FURTHER REFILLS   linaclotide  (LINZESS ) 145 MCG CAPS capsule Take 1 capsule (145 mcg total) by mouth daily before breakfast.   lisinopril  (ZESTRIL ) 20 MG tablet TAKE 1 TABLET(20 MG) BY MOUTH DAILY   magnesium  hydroxide (MILK OF MAGNESIA) 400 MG/5ML suspension Take 30 mLs by mouth daily as needed for mild constipation.   methocarbamol  (ROBAXIN ) 500 MG tablet Take 1 tablet (500 mg total) by mouth every 8 (eight) hours as needed for muscle spasms.   Multiple Vitamin (MULTIVITAMIN) tablet Take 1 tablet by mouth daily.   polyethylene glycol (MIRALAX ) 17 g packet Take 17 g by mouth daily.   tamsulosin  (FLOMAX ) 0.4 MG CAPS capsule TAKE 1 CAPSULE(0.4 MG) BY MOUTH IN THE MORNING AND AT BEDTIME   No facility-administered encounter medications on file as of 09/28/2023.    Allergies (verified) Patient has no known allergies.   History: Past Medical History:  Diagnosis Date   Allergy    Arthritis    Cataract  Glaucoma    Hypertension    Normocytic anemia 09/25/2012   Polyp, sigmoid colon 11/05/2011   Seizure (HCC)    Seizures (HCC) 03/17/2013   Urinary incontinence 01/18/2023   Past Surgical History:  Procedure Laterality Date   ANTERIOR CERVICAL DECOMP/DISCECTOMY FUSION N/A 05/22/2017   Procedure: Anterior Cervical Discectomy Fusion - Cervical Three-Cervical Four - Cervical Four-Cervical Five;  Surgeon: Onetha Kuba, MD;  Location: [facility] OR;  Service: Neurosurgery;  Laterality: N/A;   CATARACT EXTRACTION, BILATERAL     EYE SURGERY     GLAUCOMA REPAIR  10 yrs ago   TRANSURETHRAL RESECTION OF PROSTATE N/A 08/06/2022   Procedure: TRANSURETHRAL RESECTION OF THE PROSTATE (TURP) UNROOFING PROSTATIC ABSCESS;  Surgeon:  Watt Rush, MD;  Location: [facility] ORS;  Service: Urology;  Laterality: N/A;  1 HR FOR CASE   Family History  Problem Relation Age of Onset   Stroke Mother    High blood pressure Mother    Stroke Father    High blood pressure Father    Colon cancer Brother    Esophageal cancer Neg Hx    Pancreatic cancer Neg Hx    Rectal cancer Neg Hx    Stomach cancer Neg Hx    Social History   Socioeconomic History   Marital status: Married    Spouse name: [name]   Number of children: 1   Years of education: 11   Highest education level: Not on file  Occupational History   Occupation: retired  Tobacco Use   Smoking status: Former    Current packs/day: 0.00    Types: Cigarettes    Quit date: 02/18/2007    Years since quitting: 16.6   Smokeless tobacco: Never   Tobacco comments:    not sure but a while ago; quit smoking and ETOH  Vaping Use   Vaping status: Never Used  Substance and Sexual Activity   Alcohol use: No    Alcohol/week: 0.0 standard drinks of alcohol    Comment: quit in 2009   Drug use: No   Sexual activity: Not Currently  Other Topics Concern   Not on file  Social History Narrative   Patient lives at home with his wife [name] Patient is retired. Patient has 11 th grade education.    Caffeine- one cup of coffee daily and one soda.   Right handed.   Social Drivers of Corporate investment banker Strain: Low Risk  (09/28/2023)   Overall Financial Resource Strain (CARDIA)    Difficulty of Paying Living Expenses: Not hard at all  Food Insecurity: No Food Insecurity (09/28/2023)   Hunger Vital Sign    Worried About Running Out of Food in the Last Year: Never true    Ran Out of Food in the Last Year: Never true  Transportation Needs: No Transportation Needs (09/28/2023)   PRAPARE - Administrator, Civil Service (Medical): No    Lack of Transportation (Non-Medical): No  Physical Activity: Sufficiently Active (09/28/2023)   Exercise Vital Sign    Days of Exercise per  Week: 3 days    Minutes of Exercise per Session: 60 min  Stress: No Stress Concern Present (09/28/2023)   [employer] of Occupational Health - Occupational Stress Questionnaire    Feeling of Stress: Not at all  Social Connections: Moderately Integrated (09/28/2023)   Social Connection and Isolation Panel    Frequency of Communication with Friends and Family: More than three times a week    Frequency of Social Gatherings  with Friends and Family: Never    Attends Religious Services: More than 4 times per year    Active Member of Clubs or Organizations: No    Attends Engineer, structural: Never    Marital Status: Married    Tobacco Counseling Counseling given: No Tobacco comments: not sure but a while ago; quit smoking and ETOH    Clinical Intake:  Pre-visit preparation completed: Yes  Pain : No/denies pain     BMI - recorded: 21.56 Nutritional Status: BMI of 19-24  Normal Nutritional Risks: None Diabetes: No  Lab Results  Component Value Date   HGBA1C 5.8 01/10/2019     How often do you need to have someone help you when you read instructions, pamphlets, or other written materials from your doctor or pharmacy?: 1 - Never  Interpreter Needed?: No  Information entered by :: Verdie Saba, CMA   Activities of Daily Living     09/28/2023    8:13 AM  In your present state of health, do you have any difficulty performing the following activities:  Hearing? 0  Vision? 0  Difficulty concentrating or making decisions? 0  Walking or climbing stairs? 0  Dressing or bathing? 0  Doing errands, shopping? 0  Preparing Food and eating ? N  Using the Toilet? N  In the past six months, have you accidently leaked urine? N  Do you have problems with loss of bowel control? Y  Managing your Medications? N  Managing your Finances? N  Housekeeping or managing your Housekeeping? N    Patient Care Team: Rollene Almarie LABOR, MD as PCP - General (Internal  Medicine) Onita Duos, MD as Consulting Physician (Neurology) Magdalen Pasco RAMAN, DPM as Consulting Physician (Podiatry) Fate Morna SAILOR, [place] (Inactive) as Pharmacist (Pharmacist) Watt Rush, MD as Consulting Physician (Urology) Cyrus Carwin, MD as Consulting Physician (Ophthalmology)  I have updated your Care Teams any recent Medical Services you may have received from other providers in the past year.     Assessment:   This is a routine wellness examination for Evan Escobar.  Hearing/Vision screen Hearing Screening - Comments:: Denies hearing difficulties  - wearing hearing aids Vision Screening - Comments:: Wears rx glasses - up to date with routine eye exams with Dr Cyrus   Goals Addressed   None    Depression Screen     09/28/2023    8:15 AM 09/25/2022    2:48 PM 08/13/2022    9:53 AM 08/04/2022    8:18 AM 05/12/2022    9:44 AM 04/07/2022   11:06 AM 02/03/2022    8:06 AM  PHQ 2/9 Scores  PHQ - 2 Score 0 1 0 0 0 0 0  PHQ- 9 Score 0 2 0 0  0 0    Fall Risk     09/28/2023    8:14 AM 01/06/2023    8:18 AM 09/25/2022    2:45 PM 08/13/2022    9:53 AM 08/04/2022    8:18 AM  Fall Risk   Falls in the past year? 0 0 0 0 0  Number falls in past yr: 0 0 0 0 0  Injury with Fall? 0 0 0 0 0  Risk for fall due to : No Fall Risks  No Fall Risks    Follow up Falls evaluation completed;Falls prevention discussed Falls evaluation completed Falls prevention discussed Falls evaluation completed Falls evaluation completed    MEDICARE RISK AT HOME:  Medicare Risk at Home Any stairs in or around  the home?: No If so, are there any without handrails?: No Home free of loose throw rugs in walkways, pet beds, electrical cords, etc?: Yes Adequate lighting in your home to reduce risk of falls?: Yes Life alert?: No Use of a cane, walker or w/c?: Yes (cane/walker) Grab bars in the bathroom?: No Shower chair or bench in shower?: No Elevated toilet seat or a handicapped toilet?: No  TIMED UP AND  GO:  Was the test performed?  No  Cognitive Function: 6CIT completed    07/25/2016    9:45 AM 08/17/2014    8:49 AM  MMSE - Mini Mental State Exam  Not completed:  Unable to complete  Orientation to time 5    Orientation to Place 5    Registration 3    Attention/ Calculation 5    Recall 3    Language- name 2 objects 2    Language- repeat 1   Language- follow 3 step command 3    Language- read & follow direction 1    Write a sentence 1    Copy design 1    Total score 30       Data saved with a previous flowsheet row definition        09/28/2023    8:16 AM 09/25/2022    2:45 PM  6CIT Screen  What Year? 0 points 0 points  What month? 0 points 0 points  What time? 0 points 0 points  Count back from 20 0 points 0 points  Months in reverse 0 points 4 points  Repeat phrase 0 points 6 points  Total Score 0 points 10 points    Immunizations Immunization History  Administered Date(s) Administered   Fluad Quad(high Dose 65+) 12/17/2018, 11/15/2019, 10/30/2020, 11/25/2021   Influenza, High Dose Seasonal PF 11/17/2012, 11/18/2016, 12/09/2017   Influenza,inj,Quad PF,6+ Mos 11/14/2013, 01/01/2016   Influenza-Unspecified 01/18/2015, 01/04/2017, 12/02/2018, 11/25/2021, 10/27/2022   PFIZER(Purple Top)SARS-COV-2 Vaccination 04/15/2019, 05/10/2019   Pfizer Covid-19 Vaccine Bivalent Booster 61yrs & up 11/25/2021   Pfizer(Comirnaty)Fall Seasonal Vaccine 12 years and older 11/25/2021   Pneumococcal Conjugate-13 04/16/2015   Pneumococcal Polysaccharide-23 11/15/2019   Pneumococcal-Unspecified 02/17/2010   Tdap 08/17/2014, 09/08/2023   Unspecified SARS-COV-2 Vaccination 10/27/2022    Screening Tests Health Maintenance  Topic Date Due   Zoster Vaccines- Shingrix (1 of 2) Never done   COVID-19 Vaccine (5 - 2024-25 season) 04/26/2023   INFLUENZA VACCINE  09/18/2023   Medicare Annual Wellness (AWV)  09/27/2024   DTaP/Tdap/Td (3 - Td or Tdap) 09/07/2033   Pneumococcal Vaccine: 50+ Years   Completed   Hepatitis B Vaccines  Aged Out   HPV VACCINES  Aged Out   Meningococcal B Vaccine  Aged Out    Health Maintenance  Health Maintenance Due  Topic Date Due   Zoster Vaccines- Shingrix (1 of 2) Never done   COVID-19 Vaccine (5 - 2024-25 season) 04/26/2023   INFLUENZA VACCINE  09/18/2023   Health Maintenance Items Addressed:  Additional Screening:  Vision Screening: Recommended annual ophthalmology exams for early detection of glaucoma and other disorders of the eye. Would you like a referral to an eye doctor? No  Patient stated he has had an eye exam with Dr Cyrus.  Dental Screening: Recommended annual dental exams for proper oral hygiene  Community Resource Referral / Chronic Care Management: CRR required this visit?  No   CCM required this visit?  No   Plan:    I have personally reviewed and noted the following in  the patient's chart:   Medical and social history Use of alcohol, tobacco or illicit drugs  Current medications and supplements including opioid prescriptions. Patient is not currently taking opioid prescriptions. Functional ability and status Nutritional status Physical activity Advanced directives List of other physicians Hospitalizations, surgeries, and ER visits in previous 12 months Vitals Screenings to include cognitive, depression, and falls Referrals and appointments  In addition, I have reviewed and discussed with patient certain preventive protocols, quality metrics, and best practice recommendations. A written personalized care plan for preventive services as well as general preventive health recommendations were provided to patient.   Verdie CHRISTELLA Saba, CMA   09/28/2023   After Visit Summary: (In Person-Declined) Patient declined AVS at this time.  Notes: Scheduled an appt with a Provider today.  Pt is not feeling well.  Was seen at Urgent Care 1wk ago.  Today he's no better and has elevated BP.

## 2023-09-28 NOTE — Progress Notes (Signed)
 Evan Escobar 83 y.o.   Chief Complaint  Patient presents with   Cough    Patient states he has a cough and congestion for about 3 weeks now. Pt mentions having fatigue, head aches, and fever that is on and off. He states going to UC on 8/7 and was given benzonatate  (TESSALON ) 100 MG capsule but he states it did not help. Was not tested for flu/ covid      HISTORY OF PRESENT ILLNESS: Acute problem visit today. This is a 83 y.o. male complaining of flulike symptoms that started about 2 weeks ago. Went to urgent care center on 09/24/2023.  Prescribed Tessalon . Today feels worse on days ago Mostly complaining of persistent cough productive of yellowish phlegm and chest congestion No other associated symptoms No other complaints or medical concerns today.  Cough Pertinent negatives include no chest pain, chills, ear pain, fever, headaches, hemoptysis, rash, sore throat or shortness of breath.     Prior to Admission medications   Medication Sig Start Date End Date Taking? Authorizing Provider  acetaminophen  (TYLENOL ) 500 MG tablet Take 500 mg by mouth every 6 (six) hours as needed for moderate pain or headache.    Yes [provider]  aspirin  81 MG tablet Take 81 mg by mouth daily.   Yes [provider]  brimonidine (ALPHAGAN P) 0.1 % SOLN Place 1 drop into both eyes 2 (two) times daily.   Yes [provider]  cycloSPORINE  (RESTASIS ) 0.05 % ophthalmic emulsion Place 1 drop into both eyes 2 (two) times daily.   Yes [provider]  latanoprost  (XALATAN ) 0.005 % ophthalmic solution Place 1 drop into both eyes at bedtime. 09/20/12  Yes [provider]  levETIRAcetam  (KEPPRA ) 250 MG tablet Take 1 tablet (250 mg total) by mouth 2 (two) times daily. APPOINTMENT NEEDED FOR FURTHER REFILLS 06/04/23  Yes Evan Duos, MD  linaclotide  (LINZESS ) 145 MCG CAPS capsule Take 1 capsule (145 mcg total) by mouth daily before breakfast. 08/25/23  Yes Evan Lynwood ORN, MD   lisinopril  (ZESTRIL ) 20 MG tablet TAKE 1 TABLET(20 MG) BY MOUTH DAILY 05/08/23  Yes Evan Almarie LABOR, MD  magnesium  hydroxide (MILK OF MAGNESIA) 400 MG/5ML suspension Take 30 mLs by mouth daily as needed for mild constipation.   Yes [provider]  methocarbamol  (ROBAXIN ) 500 MG tablet Take 1 tablet (500 mg total) by mouth every 8 (eight) hours as needed for muscle spasms. 03/26/23  Yes Evan Almarie LABOR, MD  Multiple Vitamin (MULTIVITAMIN) tablet Take 1 tablet by mouth daily.   Yes [provider]  polyethylene glycol (MIRALAX ) 17 g packet Take 17 g by mouth daily. 11/15/22  Yes Evan Ozell HERO, MD  tamsulosin  (FLOMAX ) 0.4 MG CAPS capsule TAKE 1 CAPSULE(0.4 MG) BY MOUTH IN THE MORNING AND AT BEDTIME 05/19/23  Yes Evan Almarie LABOR, MD  benzonatate  (TESSALON ) 100 MG capsule Take 1 capsule (100 mg total) by mouth every 8 (eight) hours. Patient not taking: Reported on 09/28/2023 09/24/23   Evan Longs, MD    No Known Allergies  Patient Active Problem List   Diagnosis Date Noted   Dog bite 09/08/2023   Need for tetanus booster 09/08/2023   Bilateral impacted cerumen 08/27/2023   Hx of completed stroke 03/27/2023   Urinary incontinence 01/18/2023   Prostate abscess 08/04/2022   Anemia of chronic disease 08/04/2022   History of recurrent UTIs 05/12/2022   Toenail fungus 07/30/2021   Deficiency anemia 10/13/2019   Cough 10/13/2019   Chronic idiopathic  constipation 10/13/2019   Cervical radiculopathy 01/10/2019   Left lumbar radiculopathy 12/09/2017   Finger pain, right 10/06/2017   Hearing loss 09/09/2016   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   Encounter for general adult medical examination with abnormal findings 08/28/2015   Allergic rhinitis 02/26/2015   Glaucoma 05/07/2013   Essential hypertension 05/07/2013   Seizures (HCC) 03/17/2013    Past Medical History:  Diagnosis Date   Allergy    Arthritis    Cataract    Glaucoma     Hypertension    Normocytic anemia 09/25/2012   Polyp, sigmoid colon 11/05/2011   Seizure (HCC)    Seizures (HCC) 03/17/2013   Urinary incontinence 01/18/2023    Past Surgical History:  Procedure Laterality Date   ANTERIOR CERVICAL DECOMP/DISCECTOMY FUSION N/A 05/22/2017   Procedure: Anterior Cervical Discectomy Fusion - Cervical Three-Cervical Four - Cervical Four-Cervical Five;  Surgeon: Evan Kuba, MD;  Location: [facility] OR;  Service: Neurosurgery;  Laterality: N/A;   CATARACT EXTRACTION, BILATERAL     EYE SURGERY     GLAUCOMA REPAIR  10 yrs ago   TRANSURETHRAL RESECTION OF PROSTATE N/A 08/06/2022   Procedure: TRANSURETHRAL RESECTION OF THE PROSTATE (TURP) UNROOFING PROSTATIC ABSCESS;  Surgeon: Evan Rush, MD;  Location: [facility] ORS;  Service: Urology;  Laterality: N/A;  1 HR FOR CASE    Social History   Socioeconomic History   Marital status: Married    Spouse name: Evan Escobar   Number of children: 1   Years of education: 11   Highest education level: Not on file  Occupational History   Occupation: retired  Tobacco Use   Smoking status: Former    Current packs/day: 0.00    Types: Cigarettes    Quit date: 02/18/2007    Years since quitting: 16.6   Smokeless tobacco: Never   Tobacco comments:    not sure but a while ago; quit smoking and ETOH  Vaping Use   Vaping status: Never Used  Substance and Sexual Activity   Alcohol use: No    Alcohol/week: 0.0 standard drinks of alcohol    Comment: quit in 2009   Drug use: No   Sexual activity: Not Currently  Other Topics Concern   Not on file  Social History Narrative   Patient lives at home with his wife Evan Escobar). Patient is retired. Patient has 11 th grade education.    Caffeine- one cup of coffee daily and one soda.   Right handed.   Social Drivers of Corporate investment banker Strain: Low Risk  (09/28/2023)   Overall Financial Resource Strain (CARDIA)    Difficulty of Paying Living Expenses: Not hard at all  Food Insecurity: No  Food Insecurity (09/28/2023)   Hunger Vital Sign    Worried About Running Out of Food in the Last Year: Never true    Ran Out of Food in the Last Year: Never true  Transportation Needs: No Transportation Needs (09/28/2023)   PRAPARE - Administrator, Civil Service (Medical): No    Lack of Transportation (Non-Medical): No  Physical Activity: Sufficiently Active (09/28/2023)   Exercise Vital Sign    Days of Exercise per Week: 3 days    Minutes of Exercise per Session: 60 min  Stress: No Stress Concern Present (09/28/2023)   [employer] of Occupational Health - Occupational Stress Questionnaire    Feeling of Stress: Not at all  Social Connections: Moderately Integrated (09/28/2023)   Social Connection and Isolation Panel  Frequency of Communication with Friends and Family: More than three times a week    Frequency of Social Gatherings with Friends and Family: Never    Attends Religious Services: More than 4 times per year    Active Member of [employer] or Organizations: No    Attends Banker Meetings: Never    Marital Status: Married  Catering manager Violence: Not At Risk (09/28/2023)   Humiliation, Afraid, Rape, and Kick questionnaire    Fear of Current or Ex-Partner: No    Emotionally Abused: No    Physically Abused: No    Sexually Abused: No    Family History  Problem Relation Age of Onset   Stroke Mother    High blood pressure Mother    Stroke Father    High blood pressure Father    Colon cancer Brother    Esophageal cancer Neg Hx    Pancreatic cancer Neg Hx    Rectal cancer Neg Hx    Stomach cancer Neg Hx      Review of Systems  Constitutional: Negative.  Negative for chills and fever.  HENT:  Positive for congestion. Negative for ear pain and sore throat.   Respiratory:  Positive for cough and sputum production. Negative for hemoptysis and shortness of breath.   Cardiovascular: Negative.  Negative for chest pain and palpitations.   Gastrointestinal:  Negative for abdominal pain, diarrhea, nausea and vomiting.  Genitourinary: Negative.  Negative for dysuria and hematuria.  Skin: Negative.  Negative for rash.  Neurological: Negative.  Negative for dizziness and headaches.  All other systems reviewed and are negative.   Vitals:   09/28/23 1000  BP: (!) 148/78  Pulse: 64  Temp: 98.2 F (36.8 C)  SpO2: 99%    Physical Exam Vitals reviewed.  Constitutional:      Appearance: Normal appearance.  HENT:     Head: Normocephalic.     Mouth/Throat:     Mouth: Mucous membranes are moist.     Pharynx: Oropharynx is clear.  Eyes:     Extraocular Movements: Extraocular movements intact.     Conjunctiva/sclera: Conjunctivae normal.     Pupils: Pupils are equal, round, and reactive to light.  Cardiovascular:     Rate and Rhythm: Normal rate and regular rhythm.     Pulses: Normal pulses.     Heart sounds: Normal heart sounds.  Pulmonary:     Effort: Pulmonary effort is normal.     Breath sounds: Normal breath sounds.  Abdominal:     Palpations: Abdomen is soft.     Tenderness: There is no abdominal tenderness.  Musculoskeletal:     Cervical back: No tenderness.  Lymphadenopathy:     Cervical: No cervical adenopathy.  Skin:    General: Skin is warm and dry.     Capillary Refill: Capillary refill takes less than 2 seconds.  Neurological:     General: No focal deficit present.     Mental Status: He is alert and oriented to person, place, and time.  Psychiatric:        Mood and Affect: Mood normal.        Behavior: Behavior normal.    Results for orders placed or performed in visit on 09/28/23 (from the past 24 hours)  POCT Influenza A/B     Status: None   Collection Time: 09/28/23 10:45 AM  Result Value Ref Range   Influenza A, POC Negative Negative   Influenza B, POC Negative Negative  POC COVID-19  Status: Abnormal   Collection Time: 09/28/23 10:46 AM  Result Value Ref Range   SARS Coronavirus 2  Ag Positive (A) Negative      ASSESSMENT & PLAN: A total of 40 minutes was spent with the patient and counseling/coordination of care regarding preparing for this visit, review of most recent office visit notes, review of all medications and chronic medical conditions under management, diagnosis of COVID infection and secondary bacterial infection, need for antibiotics, review of chest x-ray images done today, symptom management, prognosis, ED precautions, documentation, review of most recent blood work results, review of today's COVID and flu test results, documentation and need for follow-up if no better or worse during the next several days.  Problem List Items Addressed This Visit       Respiratory   Lower respiratory infection - Primary   COVID infection now with secondary bacterial infection Clinically stable.  No clinical findings of pneumonia Recommend chest x-ray.  Will review images when available Recommend daily azithromycin  for 5 days ED precautions given Symptom management discussed Advised to rest and stay well-hydrated Advised to contact the office if no better or worse during the next several days.      Relevant Medications   azithromycin  (ZITHROMAX ) 250 MG tablet   Other Relevant Orders   DG Chest 2 View   CBC with Differential/Platelet   Comprehensive metabolic panel with GFR   Chest congestion   Recommend over-the-counter Mucinex DM Advised to rest and stay well-hydrated        Other   Persistent cough   Cough management discussed Continue over-the-counter Mucinex DM and cough drops Continue Tessalon  Advised to rest and stay well-hydrated      Relevant Orders   POC COVID-19 (Completed)   POCT Influenza A/B (Completed)   COVID-19 virus infection   Almost 3 weeks into illness May have secondary bacterial infection Symptom management discussed      Relevant Medications   azithromycin  (ZITHROMAX ) 250 MG tablet   Flu-like symptoms   Secondary to  COVID infection Symptom management discussed      Relevant Orders   CBC with Differential/Platelet   Comprehensive metabolic panel with GFR   POC RNCPI-80 (Completed)   POCT Influenza A/B (Completed)   Patient Instructions  COVID-19: What to Know COVID-19 is an infection caused by a virus called SARS-CoV-2. This type of virus is called a coronavirus. People with COVID-19 may: Have few to no symptoms. Have mild to moderate symptoms that affect their lungs and breathing. Get very sick. What are the causes?  COVID-19 is caused by a virus. This virus may be in the air as droplets or on surfaces. It can spread from an infected person when they cough, sneeze, speak, sing, or breathe. You may become infected if: You breathe in the infected droplets in the air. You touch an object that has the virus on it. What increases the risk? You are at risk of getting COVID-19 if you have been around someone with the infection. You may be more likely to get very sick if: You are 23 years old or older. You have certain medical conditions, such as: Heart disease. Diabetes. Long-term respiratory disease. Cancer. Pregnancy. You are immunocompromised. This means your body can't fight infections easily. You have a disability that makes it hard for you to move around, you have trouble moving, or you can't move at all. What are the signs or symptoms? People may have different symptoms from COVID-19. The symptoms can also be mild  to very bad. They often show up in 5-6 days after being infected. But, they can take up to 14 days to appear. Common symptoms are: Cough. Feeling tired. New loss of taste or smell. Fever. Less common symptoms are: Sore throat. Headache. Body or muscle aches. Diarrhea. A skin rash or fingers or toes that are a different color than usual. Red or irritated eyes. Sometimes, COVID-19 does not cause symptoms. How is this diagnosed? COVID-19 can be diagnosed with tests done in  the lab or at home. Fluid from your nose, mouth, or lungs will be used to check for the virus. How is this treated? Treatment for COVID-19 depends on how sick you are. Mild symptoms can be treated at home with rest, fluids, and over-the-counter medicines. very bad symptoms may be treated in a hospital intensive care unit (ICU). If you have symptoms and are at risk of getting very sick, you may be given a medicine that fights viruses. This medicine is called an antiviral. How is this prevented? To protect yourself from COVID-19: Know your risk factors. Get vaccinated. If your body can't fight infections easily, talk to your provider about treatment to help prevent COVID-19. Stay at least about 3 feet (1 meter) away from other people. Wear mask that fits well when: You can't stay at a distance from people. You're in a place with not a lot of air flow. Try to be in open spaces with good air flow when you are in public. Wash your hands often or use an alcohol-based hand sanitizer. Cover your nose and mouth when you cough or sneeze. If you think you have COVID-19 or have been around someone who has it, stay home and away from other people as told by your provider or health officials. Where to find more information To learn more: Go to TonerPromos.no Click Health Topics. Type COVID-19 in the search box. Go to VisitDestination.com.br Click Health Topics. Then click All Topics. Type COVID-19 in the search box. Get help right away if: You have trouble breathing or get short of breath. You have pain or pressure in your chest. You're feeling confused. These symptoms may be an emergency. Get help right away. Call 911. Do not wait to see if the symptoms will go away. Do not drive yourself to the hospital. This information is not intended to replace advice given to you by your health care provider. Make sure you discuss any questions you have with your health care provider. Document Revised: 11/06/2022  Document Reviewed: 10/29/2022 Elsevier Patient Education  2025 Elsevier Inc.   Emil Schaumann, MD [facility]

## 2023-09-28 NOTE — Assessment & Plan Note (Signed)
 Recommend over-the-counter Mucinex DM Advised to rest and stay well-hydrated

## 2023-09-28 NOTE — Assessment & Plan Note (Signed)
 Cough management discussed Continue over-the-counter Mucinex DM and cough drops Continue Tessalon  Advised to rest and stay well-hydrated

## 2023-09-28 NOTE — Assessment & Plan Note (Signed)
 Secondary to COVID infection Symptom management discussed

## 2023-10-05 ENCOUNTER — Ambulatory Visit: Payer: Self-pay | Admitting: Family Medicine

## 2023-10-05 ENCOUNTER — Ambulatory Visit (INDEPENDENT_AMBULATORY_CARE_PROVIDER_SITE_OTHER): Admitting: Podiatry

## 2023-10-05 ENCOUNTER — Encounter: Payer: Self-pay | Admitting: Family Medicine

## 2023-10-05 ENCOUNTER — Ambulatory Visit (INDEPENDENT_AMBULATORY_CARE_PROVIDER_SITE_OTHER)

## 2023-10-05 ENCOUNTER — Encounter: Payer: Self-pay | Admitting: Podiatry

## 2023-10-05 ENCOUNTER — Ambulatory Visit (INDEPENDENT_AMBULATORY_CARE_PROVIDER_SITE_OTHER): Admitting: Family Medicine

## 2023-10-05 VITALS — BP 166/74 | HR 63 | Temp 98.0°F | Ht 65.6 in | Wt 135.8 lb

## 2023-10-05 DIAGNOSIS — U071 COVID-19: Secondary | ICD-10-CM | POA: Insufficient documentation

## 2023-10-05 DIAGNOSIS — R5383 Other fatigue: Secondary | ICD-10-CM | POA: Diagnosis not present

## 2023-10-05 DIAGNOSIS — R531 Weakness: Secondary | ICD-10-CM

## 2023-10-05 DIAGNOSIS — B351 Tinea unguium: Secondary | ICD-10-CM | POA: Diagnosis not present

## 2023-10-05 DIAGNOSIS — M79672 Pain in left foot: Secondary | ICD-10-CM

## 2023-10-05 DIAGNOSIS — M2042 Other hammer toe(s) (acquired), left foot: Secondary | ICD-10-CM

## 2023-10-05 DIAGNOSIS — E871 Hypo-osmolality and hyponatremia: Secondary | ICD-10-CM | POA: Diagnosis not present

## 2023-10-05 DIAGNOSIS — M79671 Pain in right foot: Secondary | ICD-10-CM

## 2023-10-05 DIAGNOSIS — R059 Cough, unspecified: Secondary | ICD-10-CM | POA: Diagnosis not present

## 2023-10-05 LAB — CBC WITH DIFFERENTIAL/PLATELET
Basophils Absolute: 0 K/uL (ref 0.0–0.1)
Basophils Relative: 0.3 % (ref 0.0–3.0)
Eosinophils Absolute: 0 K/uL (ref 0.0–0.7)
Eosinophils Relative: 1 % (ref 0.0–5.0)
HCT: 27.8 % — ABNORMAL LOW (ref 39.0–52.0)
Hemoglobin: 9.2 g/dL — ABNORMAL LOW (ref 13.0–17.0)
Lymphocytes Relative: 48.9 % — ABNORMAL HIGH (ref 12.0–46.0)
Lymphs Abs: 1.6 K/uL (ref 0.7–4.0)
MCHC: 33 g/dL (ref 30.0–36.0)
MCV: 84.6 fl (ref 78.0–100.0)
Monocytes Absolute: 0.3 K/uL (ref 0.1–1.0)
Monocytes Relative: 10.8 % (ref 3.0–12.0)
Neutro Abs: 1.2 K/uL — ABNORMAL LOW (ref 1.4–7.7)
Neutrophils Relative %: 39 % — ABNORMAL LOW (ref 43.0–77.0)
Platelets: 162 K/uL (ref 150.0–400.0)
RBC: 3.29 Mil/uL — ABNORMAL LOW (ref 4.22–5.81)
RDW: 15.5 % (ref 11.5–15.5)
WBC: 3.2 K/uL — ABNORMAL LOW (ref 4.0–10.5)

## 2023-10-05 LAB — COMPREHENSIVE METABOLIC PANEL WITH GFR
ALT: 11 U/L (ref 0–53)
AST: 23 U/L (ref 0–37)
Albumin: 4 g/dL (ref 3.5–5.2)
Alkaline Phosphatase: 24 U/L — ABNORMAL LOW (ref 39–117)
BUN: 12 mg/dL (ref 6–23)
CO2: 26 meq/L (ref 19–32)
Calcium: 9.2 mg/dL (ref 8.4–10.5)
Chloride: 96 meq/L (ref 96–112)
Creatinine, Ser: 0.96 mg/dL (ref 0.40–1.50)
GFR: 73.77 mL/min (ref >60.00)
Glucose, Bld: 83 mg/dL (ref 70–99)
Potassium: 4.3 meq/L (ref 3.5–5.1)
Sodium: 130 meq/L — ABNORMAL LOW (ref 135–145)
Total Bilirubin: 0.6 mg/dL (ref 0.2–1.2)
Total Protein: 7.1 g/dL (ref 6.0–8.3)

## 2023-10-05 NOTE — Progress Notes (Signed)
 This patient presents to the office with chief complaint of long thick painful nails.  Patient says the nails are painful walking and wearing shoes.  This patient is unable to self treat.  This patient is unable to trim her nails since she is unable to reach her nails.  She presents to the office for preventative foot care services.  General Appearance  Alert, conversant and in no acute stress.  Vascular  Dorsalis pedis and posterior tibial  pulses are palpable  bilaterally.  Capillary return is within normal limits  bilaterally. Temperature is within normal limits  bilaterally.  Neurologic  Senn-Weinstein monofilament wire test within normal limits  bilaterally. Muscle power within normal limits bilaterally.  Nails Thick disfigured discolored nails with subungual debris  from hallux to fifth toes bilaterally. No evidence of bacterial infection or drainage bilaterally.  Orthopedic  No limitations of motion  feet .  No crepitus or effusions noted.  HAV  B/L.  Hammer toe  B/L.  Skin  normotropic skin with no porokeratosis noted bilaterally.  No signs of infections or ulcers noted.     Onychomycosis  Nails  B/L.  Pain in right toes  Pain in left toes  Debridement of nails both feet followed trimming the nails with dremel tool.  Toe cap  dispensed second toe  B/L   RTC 4 months.   Cordella Bold DPM

## 2023-10-05 NOTE — Progress Notes (Signed)
 Acute Office Visit  Subjective:     Patient ID: Evan Escobar, male    DOB: 12/26/1940, 83 y.o.   MRN: [id number]  Chief Complaint  Patient presents with   Follow-up    Still coughing, having chest tightness. Has body aches, overall still does not feel well    HPI  Discussed the use of AI scribe software for clinical note transcription with the patient, who gave verbal consent to proceed.  History of Present Illness Evan Escobar is an 83 year old male who presents with persistent cough and fatigue following a recent COVID-19 infection.  Cough and respiratory symptoms - Persistent productive cough with thick yellow sputum since September 28, 2023, following positive COVID-19 test - Worsening cough despite treatment with azithromycin , which provided some improvement  Fatigue and generalized weakness - Ongoing fatigue and generalized weakness since COVID-19 infection - Decreased appetite - Maintains hydration with water and orange juice  Laboratory findings - Previous blood tests showed low sodium levels - Other laboratory results were within normal limits     ROS Per HPI      Objective:    BP (!) 166/74 (BP Location: Left Arm, Patient Position: Sitting)   Pulse 63   Temp 98 F (36.7 C) (Temporal)   Ht 5' 5.6 (1.666 m)   Wt 135 lb 12.8 oz (61.6 kg)   SpO2 99%   BMI 22.19 kg/m    Physical Exam Vitals and nursing note reviewed.  Constitutional:      General: He is not in acute distress.    Comments: Appears fatigued  HENT:     Head: Normocephalic and atraumatic.     Right Ear: External ear normal.     Left Ear: External ear normal.     Nose: Nose normal.     Mouth/Throat:     Comments: Oropharyngeal cobblestoning   Eyes:     Extraocular Movements: Extraocular movements intact.  Cardiovascular:     Rate and Rhythm: Normal rate and regular rhythm.     Pulses: Normal pulses.     Heart sounds: Normal heart sounds.  Pulmonary:     Effort: Pulmonary  effort is normal. No respiratory distress.     Breath sounds: Normal breath sounds. No wheezing, rhonchi or rales.  Musculoskeletal:     Cervical back: Normal range of motion.     Right lower leg: No edema.     Left lower leg: No edema.     Comments: Using single point cane  Lymphadenopathy:     Cervical: No cervical adenopathy.  Skin:    General: Skin is warm and dry.  Neurological:     General: No focal deficit present.     Mental Status: He is alert and oriented to person, place, and time.  Psychiatric:        Mood and Affect: Mood normal.        Behavior: Behavior normal.     No results found for any visits on 10/05/23.      Assessment & Plan:   Assessment and Plan Assessment & Plan COVID-19 infection with persistent cough, fatigue, and decreased appetite COVID-19 infection diagnosed on September 28, 2023, with persistent symptoms. Cough worsening, no pneumonia signs. Previous azithromycin  provided relief. Fatigue and decreased appetite expected to improve gradually. - Order chest x-ray to evaluate for lingering pulmonary issues. - Order repeat blood work to check white blood cell count and kidney function. - Continue hydration and rest.  Hyponatremia Previous blood work showed  low sodium levels. COVID-19 can contribute to hyponatremia. Current sodium intake is low. - Increase dietary salt intake.     Orders Placed This Encounter  Procedures   DG Chest 2 View    Standing Status:   Future    Number of Occurrences:   1    Expiration Date:   04/06/2024    Reason for Exam (SYMPTOM  OR DIAGNOSIS REQUIRED):   COVID, cough    Preferred imaging location?:   [facility]   CBC with Differential/Platelet    Release to patient:   Immediate [1]   Comprehensive metabolic panel with GFR    Release to patient:   Immediate [1]     No orders of the defined types were placed in this encounter.   Return if symptoms worsen or fail to improve.  Corean LITTIE Ku,  FNP

## 2023-10-05 NOTE — Patient Instructions (Addendum)
 Continue supportive care at home, make sure you are drinking plenty of fluids and getting some rest.  May take ibuprofen  and Tylenol  as needed for fever, aches and pains.  Stay home and away from everyone until you are 24 hours fever free without fever reducing medications.  Follow-up with me if symptoms are persisting over the next week.  We are getting an xray today. We will be in contact with any abnormal results that require further attention.  We are checking labs today, will be in contact with any results that require further attention  Follow-up in the emergency room if you are experiencing shortness of breath, high fever, unremitting cough, severe fatigue, other concerning symptoms.

## 2023-10-06 ENCOUNTER — Emergency Department (HOSPITAL_COMMUNITY)
Admission: EM | Admit: 2023-10-06 | Discharge: 2023-10-06 | Disposition: A | Discharge status: Home / Self Care | Attending: Student | Admitting: Student

## 2023-10-06 ENCOUNTER — Ambulatory Visit: Payer: Self-pay

## 2023-10-06 ENCOUNTER — Emergency Department (HOSPITAL_COMMUNITY)

## 2023-10-06 DIAGNOSIS — D72819 Decreased white blood cell count, unspecified: Secondary | ICD-10-CM | POA: Diagnosis not present

## 2023-10-06 DIAGNOSIS — U099 Post covid-19 condition, unspecified: Secondary | ICD-10-CM | POA: Diagnosis not present

## 2023-10-06 DIAGNOSIS — Z7982 Long term (current) use of aspirin: Secondary | ICD-10-CM | POA: Insufficient documentation

## 2023-10-06 DIAGNOSIS — Z79899 Other long term (current) drug therapy: Secondary | ICD-10-CM | POA: Insufficient documentation

## 2023-10-06 DIAGNOSIS — R531 Weakness: Secondary | ICD-10-CM | POA: Insufficient documentation

## 2023-10-06 DIAGNOSIS — D649 Anemia, unspecified: Secondary | ICD-10-CM | POA: Insufficient documentation

## 2023-10-06 DIAGNOSIS — R059 Cough, unspecified: Secondary | ICD-10-CM | POA: Diagnosis not present

## 2023-10-06 DIAGNOSIS — E871 Hypo-osmolality and hyponatremia: Secondary | ICD-10-CM | POA: Diagnosis not present

## 2023-10-06 DIAGNOSIS — I10 Essential (primary) hypertension: Secondary | ICD-10-CM | POA: Insufficient documentation

## 2023-10-06 LAB — CBC WITH DIFFERENTIAL/PLATELET
Abs Immature Granulocytes: 0.01 K/uL (ref 0.00–0.07)
Basophils Absolute: 0 K/uL (ref 0.0–0.1)
Basophils Relative: 0 %
Eosinophils Absolute: 0 K/uL (ref 0.0–0.5)
Eosinophils Relative: 1 %
HCT: 26.1 % — ABNORMAL LOW (ref 39.0–52.0)
Hemoglobin: 8.5 g/dL — ABNORMAL LOW (ref 13.0–17.0)
Immature Granulocytes: 0 %
Lymphocytes Relative: 50 %
Lymphs Abs: 1.7 K/uL (ref 0.7–4.0)
MCH: 28.1 pg (ref 26.0–34.0)
MCHC: 32.6 g/dL (ref 30.0–36.0)
MCV: 86.4 fL (ref 80.0–100.0)
Monocytes Absolute: 0.4 K/uL (ref 0.1–1.0)
Monocytes Relative: 13 %
Neutro Abs: 1.2 K/uL — ABNORMAL LOW (ref 1.7–7.7)
Neutrophils Relative %: 36 %
Platelets: 165 K/uL (ref 150–400)
RBC: 3.02 MIL/uL — ABNORMAL LOW (ref 4.22–5.81)
RDW: 15.4 % (ref 11.5–15.5)
WBC: 3.4 K/uL — ABNORMAL LOW (ref 4.0–10.5)
nRBC: 0 % (ref 0.0–0.2)

## 2023-10-06 LAB — COMPREHENSIVE METABOLIC PANEL WITH GFR
ALT: 13 U/L (ref 0–44)
AST: 20 U/L (ref 15–41)
Albumin: 3.8 g/dL (ref 3.5–5.0)
Alkaline Phosphatase: 25 U/L — ABNORMAL LOW (ref 38–126)
Anion gap: 10 (ref 5–15)
BUN: 12 mg/dL (ref 8–23)
CO2: 23 mmol/L (ref 22–32)
Calcium: 9 mg/dL (ref 8.9–10.3)
Chloride: 96 mmol/L — ABNORMAL LOW (ref 98–111)
Creatinine, Ser: 0.98 mg/dL (ref 0.61–1.24)
GFR, Estimated: >60 mL/min (ref >60)
Glucose, Bld: 93 mg/dL (ref 70–99)
Potassium: 4 mmol/L (ref 3.5–5.1)
Sodium: 129 mmol/L — ABNORMAL LOW (ref 135–145)
Total Bilirubin: 0.8 mg/dL (ref 0.0–1.2)
Total Protein: 7.1 g/dL (ref 6.5–8.1)

## 2023-10-06 LAB — URINALYSIS, ROUTINE W REFLEX MICROSCOPIC
Bilirubin Urine: NEGATIVE
Glucose, UA: NEGATIVE mg/dL
Hgb urine dipstick: NEGATIVE
Ketones, ur: NEGATIVE mg/dL
Nitrite: NEGATIVE
Protein, ur: NEGATIVE mg/dL
Specific Gravity, Urine: 1.004 — ABNORMAL LOW (ref 1.005–1.030)
pH: 6 (ref 5.0–8.0)

## 2023-10-06 MED ORDER — IOHEXOL 350 MG/ML SOLN
75.0000 mL | Freq: Once | INTRAVENOUS | Status: AC | PRN
  Administered 2023-10-06: 75 mL via INTRAVENOUS

## 2023-10-06 NOTE — Discharge Instructions (Addendum)
 Your laboratories were within normal limits aside from some low sodium, please follow-up with your primary care physician as needed.  Turn to the emergency department if experience any worsening symptoms.

## 2023-10-06 NOTE — Telephone Encounter (Signed)
 FYI Only or Action Required?: Action required by provider: Advised ED, patient refused, contacted [facility] and routed.  Patient was last seen in primary care on 10/05/2023 by Alvia Corean CROME, FNP.  Called Nurse Triage reporting Dizziness.  Symptoms began yesterday.  Interventions attempted: Nothing.  Symptoms are: gradually worsening.  Triage Disposition: Go to ED Now (or PCP Triage)  Patient/caregiver understands and will follow disposition?: No, refuses disposition      Copied from CRM [id number]. Topic: Clinical - Red Word Triage >> Oct 06, 2023 11:43 AM Evan Escobar wrote: Red Word that prompted transfer to Nurse Triage: Severe head congestion,not feeling any better from yesterday. Would like to speak with a nurse. Reason for Disposition  SEVERE dizziness (e.g., unable to stand, requires support to walk, feels like passing out now)  Answer Assessment - Initial Assessment Questions Advised patient to go to ED, patient declined;  later stated will see if my wife will take me. Contacted [facility], phone disconnected, routed to office as High Priority.  Patient reports last night and this morning I was stumbling when I walked.  Patient reports; right ear feeling clogged, nasal congestion. Patient denies HA, SOB, states always feels weakness on left side, nothing more than usual.  1. DESCRIPTION: Describe your dizziness.     Try to walk, have to put more weight on cane than normal feel like stumbing 2. LIGHTHEADED: Do you feel lightheaded? (e.g., somewhat faint, woozy, weak upon standing)     When walking 3. VERTIGO: Do you feel like either you or the room is spinning or tilting? (i.e., vertigo)     no 4. SEVERITY: How bad is it?  Do you feel like you are going to faint? Can you stand and walk?     When walking 5. ONSET:  When did the dizziness begin?     Last night 6. AGGRAVATING FACTORS: Does anything make it worse? (e.g., standing, change in head position)      walking 7. HEART RATE: Can you tell me your heart rate? How many beats in 15 seconds?  (Note: Not all patients can do this.)      BP 127/57 HR-82 this morning 8. CAUSE: What do you think is causing the dizziness? (e.g., decreased fluids or food, diarrhea, emotional distress, heat exposure, new medicine, sudden standing, vomiting; unknown)     Head congestion 9. RECURRENT SYMPTOM: Have you had dizziness before? If Yes, ask: When was the last time? What happened that time?     This morning 10. OTHER SYMPTOMS: Do you have any other symptoms? (e.g., fever, chest pain, vomiting, diarrhea, bleeding)       Coughing-felt like about vomit, denies chest pain, n/v/d, fever, bleeding  Protocols used: Dizziness - Lightheadedness-A-AH

## 2023-10-06 NOTE — ED Provider Triage Note (Signed)
 Emergency Medicine Provider Triage Evaluation Note  Evan Escobar , a 83 y.o. male  was evaluated in triage.  Pt complains of generalized weakness and fatigue starting this morning, called doctor's office and was told to come into the emergency department for evaluation.  Noted to have a previous history of being diagnosed with COVID on 8/11, seen yesterday and had clear chest x-ray but did notice a mild hyponatremia of 130, told to eat more salt.  However states that he had recently been to a 3-day revival where he had been standing a lot more than usual, but says that he had been eating and drinking appropriately.  Told doctor's office initially that he was experiencing dizziness however today states that he is not experience any dizziness, any blurry vision.  Also denying fever, unilateral weakness, tinnitus, chest pain, shortness of breath, abdominal pain, nausea, vomiting, dysuria.  Review of Systems  Positive: N/a Negative: N/a  Physical Exam  BP 136/66 (BP Location: Left Arm)   Pulse 71   Temp 98.3 F (36.8 C)   Resp 14   SpO2 98%  Gen:   Awake, no distress   Resp:  Normal effort  MSK:   Moves extremities without difficulty, able to ambulate independently Other:    Medical Decision Making  Medically screening exam initiated at 1:26 PM.  Appropriate orders placed.  Ernie Kasler was informed that the remainder of the evaluation will be completed by another provider, this initial triage assessment does not replace that evaluation, and the importance of remaining in the ED until their evaluation is complete.     Beola Terrall RAMAN, [place] 10/06/23 1329

## 2023-10-06 NOTE — ED Notes (Signed)
 Pt ambulated to the bathroom with cane with minimal assitance, pt denies any issues or dizziness or SOB while ambulating

## 2023-10-06 NOTE — Telephone Encounter (Signed)
 Agree with ER.

## 2023-10-06 NOTE — ED Provider Notes (Signed)
  EMERGENCY DEPARTMENT AT [facility] Provider Note   CSN: [id number] Arrival date & time: 10/06/23  1246     Patient presents with: No chief complaint on file.   Evan Escobar is a 83 y.o. male.   83 y.o male with a PMH of HTN, Seizures, presents to the ED with a chief complaint of weakness. Patient was originally diagnosed with COVID-19 on September 18, 2023, reports he completed Paxlovid course of 6 tablets.  He reports that he is not feeling much better.  Has followed up with his PCP approximately 3 times, had multiple x-rays, labs and reports there has not been anything abnormal found aside from some low sodium.  On today's visit, he tells me that he still feels very winded without any kind of activity.  He tried to walk this morning with his cane and could not actually get his steps and order.  He tells me that he feels overall weak. No fever, no chest pain, no other complaints.   The history is provided by the patient.       Prior to Admission medications   Medication Sig Start Date End Date Taking? Authorizing Provider  acetaminophen  (TYLENOL ) 500 MG tablet Take 500 mg by mouth every 6 (six) hours as needed for moderate pain or headache.     [provider]  aspirin  81 MG tablet Take 81 mg by mouth daily.    [provider]  azithromycin  (ZITHROMAX ) 250 MG tablet Sig as indicated 09/28/23   Purcell Emil Schanz, MD  benzonatate  (TESSALON ) 100 MG capsule Take 1 capsule (100 mg total) by mouth every 8 (eight) hours. 09/24/23   Piontek, Rocky, MD  brimonidine (ALPHAGAN P) 0.1 % SOLN Place 1 drop into both eyes 2 (two) times daily.    [provider]  cycloSPORINE  (RESTASIS ) 0.05 % ophthalmic emulsion Place 1 drop into both eyes 2 (two) times daily.    [provider]  latanoprost  (XALATAN ) 0.005 % ophthalmic solution Place 1 drop into both eyes at bedtime. 09/20/12   [provider]  levETIRAcetam  (KEPPRA ) 250 MG tablet Take 1  tablet (250 mg total) by mouth 2 (two) times daily. APPOINTMENT NEEDED FOR FURTHER REFILLS 06/04/23   Onita Duos, MD  linaclotide  (LINZESS ) 145 MCG CAPS capsule Take 1 capsule (145 mcg total) by mouth daily before breakfast. 08/25/23   [name] ORN, MD  lisinopril  (ZESTRIL ) 20 MG tablet TAKE 1 TABLET(20 MG) BY MOUTH DAILY 05/08/23   Rollene Almarie LABOR, MD  magnesium  hydroxide (MILK OF MAGNESIA) 400 MG/5ML suspension Take 30 mLs by mouth daily as needed for mild constipation.    [provider]  methocarbamol  (ROBAXIN ) 500 MG tablet Take 1 tablet (500 mg total) by mouth every 8 (eight) hours as needed for muscle spasms. 03/26/23   Rollene Almarie LABOR, MD  Multiple Vitamin (MULTIVITAMIN) tablet Take 1 tablet by mouth daily.    [provider]  polyethylene glycol (MIRALAX ) 17 g packet Take 17 g by mouth daily. 11/15/22   Theadore Ozell HERO, MD  tamsulosin  (FLOMAX ) 0.4 MG CAPS capsule TAKE 1 CAPSULE(0.4 MG) BY MOUTH IN THE MORNING AND AT BEDTIME 05/19/23   Rollene Almarie LABOR, MD    Allergies: Patient has no known allergies.    Review of Systems  Constitutional:  Negative for chills and fever.  Respiratory:  Positive for cough. Negative for shortness of breath.   Cardiovascular:  Negative for chest pain.  Gastrointestinal:  Negative for abdominal pain.  All other systems reviewed and are negative.   Updated Vital Signs BP (!) 161/77 (BP Location: Left Arm)   Pulse (!) 45   Temp (!) 97.4 F (36.3 C) (Oral)   Resp 15   SpO2 100%   Physical Exam Vitals and nursing note reviewed.  Constitutional:      Appearance: He is well-developed.  HENT:     Head: Normocephalic and atraumatic.  Eyes:     General: No scleral icterus.    Pupils: Pupils are equal, round, and reactive to light.  Cardiovascular:     Heart sounds: Normal heart sounds.  Pulmonary:     Effort: Pulmonary effort is normal.     Breath sounds: Normal breath sounds. No wheezing.  Chest:     Chest wall: No  tenderness.  Abdominal:     General: Bowel sounds are normal. There is no distension.     Palpations: Abdomen is soft.     Tenderness: There is no abdominal tenderness.  Musculoskeletal:        General: No tenderness or deformity.     Cervical back: Normal range of motion.  Skin:    General: Skin is warm and dry.  Neurological:     Mental Status: He is alert and oriented to person, place, and time.     (all labs ordered are listed, but only abnormal results are displayed) Labs Reviewed  CBC WITH DIFFERENTIAL/PLATELET - Abnormal; Notable for the following components:      Result Value   WBC 3.4 (*)    RBC 3.02 (*)    Hemoglobin 8.5 (*)    HCT 26.1 (*)    Neutro Abs 1.2 (*)    All other components within normal limits  COMPREHENSIVE METABOLIC PANEL WITH GFR - Abnormal; Notable for the following components:   Sodium 129 (*)    Chloride 96 (*)    Alkaline Phosphatase 25 (*)    All other components within normal limits  URINALYSIS, ROUTINE W REFLEX MICROSCOPIC - Abnormal; Notable for the following components:   Specific Gravity, Urine 1.004 (*)    Leukocytes,Ua MODERATE (*)    Bacteria, UA FEW (*)    All other components within normal limits    EKG: EKG Interpretation Date/Time:  Tuesday October 06 2023 13:45:13 EDT Ventricular Rate:  69 PR Interval:  158 QRS Duration:  84 QT Interval:  380 QTC Calculation: 407 R Axis:   19  Text Interpretation: Normal sinus rhythm Septal infarct , age undetermined Confirmed by Palumbo, April ([id number]) on 10/08/2023 6:25:28 PM  Radiology: CT Angio Chest PE W and/or Wo Contrast Result Date: 10/06/2023 CLINICAL DATA:  Pulmonary embolism (PE) suspected, high prob, cough, COVID-19 EXAM: CT ANGIOGRAPHY CHEST WITH CONTRAST TECHNIQUE: Multidetector CT imaging of the chest was performed using the standard protocol during bolus administration of intravenous contrast. Multiplanar CT image reconstructions and MIPs were obtained to evaluate the vascular  anatomy. RADIATION DOSE REDUCTION: This exam was performed according to the departmental dose-optimization program which includes automated exposure control, adjustment of the mA and/or kV according to patient size and/or use of iterative reconstruction technique. CONTRAST:  75mL OMNIPAQUE  IOHEXOL  350 MG/ML SOLN COMPARISON:  10/05/2023 FINDINGS: Cardiovascular: This is a technically adequate evaluation of the pulmonary vasculature. No filling defects or pulmonary emboli. The heart is unremarkable without pericardial effusion. No evidence of thoracic aortic aneurysm or dissection. Mediastinum/Nodes: No enlarged mediastinal, hilar, or axillary lymph nodes. Thyroid  gland, trachea, and esophagus demonstrate no significant findings. Lungs/Pleura: No acute airspace  disease, effusion, or pneumothorax. Central airways are patent. Upper Abdomen: No acute abnormality. Musculoskeletal: No acute or destructive bony abnormalities. Reconstructed images demonstrate no additional findings. Review of the MIP images confirms the above findings. IMPRESSION: 1. No evidence of pulmonary embolus. 2. No acute intrathoracic process. Electronically Signed   By: Ozell Daring M.D.   On: 10/06/2023 19:40     Procedures   Medications Ordered in the ED  iohexol  (OMNIPAQUE ) 350 MG/ML injection 75 mL (75 mLs Intravenous Contrast Given 10/06/23 1935)    Clinical Course as of 10/08/23 1911  Tue Oct 06, 2023  [address]): MODERATE [JS]  1715 Bacteria, UA(!): FEW [JS]  1715 WBC, UA: 6-10 [JS]  1716 Sodium(!): 129 [JS]    Clinical Course User Index [JS] Ryer Asato, PA-C                                 Medical Decision Making Amount and/or Complexity of Data Reviewed Labs:  Decision-making details documented in ED Course. Radiology: ordered.  Risk Prescription drug management.    This patient presents to the ED for concern of weakness, this involves a number of treatment options, and is a complaint that  carries with it a high risk of complications and morbidity.  The differential diagnosis includes infection, PE, versus covid 19 complication.    Co morbidities: Discussed in HPI   Brief History:  See HPI.   EMR reviewed including pt PMHx, past surgical history and past visits to ER.   See HPI for more details   Lab Tests:  I ordered and independently interpreted labs.  The pertinent results include:    CBC with some leukopenia, hemoglobin is slightly decreased from his baseline but he denies any rectal bleeding, no hemoptysis. CMP with some hyponatremia, according to review of records, it does appear that this is chronic for patient, he was told to add more salt to his diet.  Creatinine levels unremarkable.  LFTs are within normal limits.  Imaging Studies:  CT angio PE negative for any acute findings in this time.  Cardiac Monitoring:  The patient was maintained on a cardiac monitor.  I personally viewed and interpreted the cardiac monitored which showed an underlying rhythm of: NSR EKG non-ischemic   Medicines ordered:  N/A  Reevaluation:  After the interventions noted above I re-evaluated patient and found that they have :improved  Social Determinants of Health:  The patient's social determinants of health were a factor in the care of this patient  Problem List / ED Course:  Patient present to the ED post COVID-19 infection which occurred approximately 2 weeks ago.  However, states that after he arrived to the emergency department his symptoms have resolved.  Reports since then he has been feeling not like himself, felt like he was weak, continues to have a cough, feels like he is somewhat winded with any kind of activity.  He did have a follow-up chest x-ray by PCP which did not show any pneumonia.  He has not been running fevers.  He arrived to the ED today hemodynamically stable and afebrile.  He tells me that he has been feeling somewhat weak, he is concerned that  his sodium may be low as this has been the case in the past.  I did review his records and see that he was asked to add salt to his diet.  He does walk with a cane but feels that  this has been harder lately. Neurologically intact.  Blood work obtained does show hyponatremia however around his baseline.  No other electrolyte derangement.  Creatinine levels within normal limits.  LFTs are normal.  CBC with some leukopenia present suspect likely post.  UA with moderate leukocytes and few bacteria and 6-10 white blood cell count does not have any urinary symptoms at this time.  He did treat his COVID-19 with a full dose of Paxlovid. Due to shortness of breath along with worsening with exertion and pulmonary embolism was highly consider.  CT angio did not show any acute findings.  EKG was normal sinus rhythm, ACS was considered however he tells me he does not have pain in his chest is more so generalized weakness along with short of breath. Patient was ambulated in the ED without any desaturation, we did discuss appropriate follow-up with PCP.  He is hemodynamically stable for discharge.  Dispostion:  After consideration of the diagnostic results and the patients response to treatment, I feel that the patent would benefit from close follow-up with primary care physician.      Final diagnoses:  Weakness  Post covid-19 condition, unspecified    ED Discharge Orders     None          Aleks Nawrot, PA-C 10/08/23 1911    Albertina Dixon, MD 10/11/23 [phone]

## 2023-10-07 ENCOUNTER — Telehealth: Payer: Self-pay

## 2023-10-07 NOTE — Transitions of Care (Post Inpatient/ED Visit) (Signed)
   10/07/2023  Name: Evan Escobar MRN: [id number] DOB: Dec 04, 1940  Today's TOC FU Call Status:    Attempted to reach the patient regarding the most recent Inpatient/ED visit.  Follow Up Plan: Additional outreach attempts will be made to reach the patient to complete the Transitions of Care (Post Inpatient/ED visit) call.   Signature Mercedes Valeriano,CMA

## 2023-10-07 NOTE — Telephone Encounter (Signed)
 Ive called patient to check on him and to get him scheduled for hospital f/u with Dr Rollene. They want him to come see in week around the 29th of August. Patient did not answer 1st attempt

## 2023-10-13 ENCOUNTER — Encounter (INDEPENDENT_AMBULATORY_CARE_PROVIDER_SITE_OTHER): Admitting: Ophthalmology

## 2023-10-13 DIAGNOSIS — H348312 Tributary (branch) retinal vein occlusion, right eye, stable: Secondary | ICD-10-CM | POA: Diagnosis not present

## 2023-10-13 DIAGNOSIS — H43813 Vitreous degeneration, bilateral: Secondary | ICD-10-CM

## 2023-10-13 DIAGNOSIS — H35033 Hypertensive retinopathy, bilateral: Secondary | ICD-10-CM

## 2023-10-13 DIAGNOSIS — I10 Essential (primary) hypertension: Secondary | ICD-10-CM | POA: Diagnosis not present

## 2023-10-13 DIAGNOSIS — H348122 Central retinal vein occlusion, left eye, stable: Secondary | ICD-10-CM

## 2023-10-27 DIAGNOSIS — H3562 Retinal hemorrhage, left eye: Secondary | ICD-10-CM | POA: Diagnosis not present

## 2023-10-27 DIAGNOSIS — H16223 Keratoconjunctivitis sicca, not specified as Sjogren's, bilateral: Secondary | ICD-10-CM | POA: Diagnosis not present

## 2023-10-27 DIAGNOSIS — H401133 Primary open-angle glaucoma, bilateral, severe stage: Secondary | ICD-10-CM | POA: Diagnosis not present

## 2023-10-27 DIAGNOSIS — H348312 Tributary (branch) retinal vein occlusion, right eye, stable: Secondary | ICD-10-CM | POA: Diagnosis not present

## 2023-11-25 ENCOUNTER — Ambulatory Visit (INDEPENDENT_AMBULATORY_CARE_PROVIDER_SITE_OTHER)

## 2023-11-25 ENCOUNTER — Encounter: Payer: Self-pay | Admitting: Podiatry

## 2023-11-25 ENCOUNTER — Ambulatory Visit: Admitting: Podiatry

## 2023-11-25 DIAGNOSIS — M659 Unspecified synovitis and tenosynovitis, unspecified site: Secondary | ICD-10-CM

## 2023-11-25 DIAGNOSIS — M2042 Other hammer toe(s) (acquired), left foot: Secondary | ICD-10-CM

## 2023-11-25 NOTE — Progress Notes (Signed)
 Subjective:   Patient ID: Evan Escobar, male   DOB: 83 y.o.   MRN: [id number]   HPI Patient presents stating second toe left is really been bothering him and the ingrown seems to have healed well   ROS      Objective:  Physical Exam  Neurovascular status unchanged significant structural malalignment between the hallux and second toe left foot with inflammation     Assessment:  Hammertoe deformity structural deformity and pressure between the hallux second toe     Plan:  H&P reviewed condition and was not able to find a corn or fluid accumulation area.  I have recommended cushioning to try to offload weight from the second digit and wider shoes soaks and may require more aggressive treatment if symptoms do not reduce.  All questions answered today  X-rays do indicate compression between the hallux and second digit but did not note other pathology

## 2023-11-30 ENCOUNTER — Telehealth: Payer: Self-pay | Admitting: Internal Medicine

## 2023-11-30 ENCOUNTER — Other Ambulatory Visit: Payer: Self-pay | Admitting: Internal Medicine

## 2023-11-30 NOTE — Telephone Encounter (Unsigned)
 Copied from CRM [id number]. Topic: Clinical - Medication Refill >> Nov 30, 2023  3:29 PM Fonda T wrote: Medication: tamsulosin  (FLOMAX ) 0.4 MG CAPS  Patient states he called earlier today for medication refill request on this medication. Patient is requesting a refill as soon as possible, for a pick up on tomorrow, 12/01/23.  Per chart review, no medication refill request was sent on previous call.   Has the patient contacted their pharmacy? Yes, advised to contact office.   This is the patient's preferred pharmacy:  [facility] [phone] - [place], [place] - [address] AT [facility] [address] [place] [address] Phone: [phone] Fax: [phone]  Is this the correct pharmacy for this prescription? Yes If no, delete pharmacy and type the correct one.   Has the prescription been filled recently? Yes  Is the patient out of the medication? Yes  Has the patient been seen for an appointment in the last year OR does the patient have an upcoming appointment? Yes  Can we respond through MyChart? No, prefers a phone call to [phone].  Agent: Please be advised that Rx refills may take up to 3 business days. We ask that you follow-up with your pharmacy.

## 2023-11-30 NOTE — Telephone Encounter (Signed)
 Refill was sent today.

## 2023-12-04 ENCOUNTER — Telehealth: Payer: Self-pay

## 2023-12-04 NOTE — Telephone Encounter (Signed)
 Copied from CRM [id number]. Topic: Clinical - Medication Question >> Dec 03, 2023  4:13 PM Thersia BROCKS wrote: Reason for CRM: Patient called in regarding  blood pressure monitor , would like to know about how much it will be

## 2023-12-07 ENCOUNTER — Other Ambulatory Visit (HOSPITAL_COMMUNITY): Payer: Self-pay

## 2023-12-07 NOTE — Telephone Encounter (Signed)
 Please advise. I thought patient could go to the drug store and just purchase

## 2023-12-07 NOTE — Telephone Encounter (Unsigned)
 Copied from CRM [id number]. Topic: Clinical - Medication Question >> Dec 03, 2023  4:13 PM Thersia BROCKS wrote: Reason for CRM: Patient called in regarding  blood pressure monitor , would like to know about how much it will be >> Dec 07, 2023  1:57 PM Robinson H wrote: Patient calling following up on price of blood monitor price, states he was advised by [place] to find out how much it will be with provider.  [place] [phone]

## 2023-12-07 NOTE — Telephone Encounter (Signed)
 Ok to call back patient. We cannot tell him the cost of bp meter as there are many with different costs. If he is asking for an rx for a bp meter let me know otherwise I cannot help here.

## 2023-12-07 NOTE — Telephone Encounter (Unsigned)
 Copied from CRM #[id number]. Topic: Clinical - Medical Advice >> Dec 07, 2023  2:52 PM Shereese L wrote: Reason for CRM: Patient is requesting a call back and wants to speak with the nurse

## 2023-12-08 NOTE — Telephone Encounter (Unsigned)
 Copied from CRM [id number]. Topic: Clinical - Medical Advice >> Dec 08, 2023 12:31 PM Anairis L wrote: Reason for CRM: Patient would like RX for BP meter to be sent in.   [facility] [phone] - [place], [place] - [address] AT [facility] [address] [place] [address] Phone: [phone] Fax: [phone] Hours: Not open 24 hours

## 2023-12-09 ENCOUNTER — Other Ambulatory Visit: Payer: Self-pay | Admitting: Internal Medicine

## 2023-12-09 DIAGNOSIS — I10 Essential (primary) hypertension: Secondary | ICD-10-CM

## 2023-12-09 MED ORDER — BLOOD PRESSURE MONITOR DEVI: 1.0000 | Freq: Every day | 0 refills | Status: DC

## 2023-12-09 NOTE — Telephone Encounter (Signed)
 Rx for BP monitor has been sent in to pharmacy. Pt is aware.

## 2023-12-09 NOTE — Telephone Encounter (Signed)
 Please send in pharmacy is on file

## 2023-12-09 NOTE — Telephone Encounter (Signed)
 Ok to send in or call in

## 2023-12-16 ENCOUNTER — Emergency Department (HOSPITAL_COMMUNITY)

## 2023-12-16 ENCOUNTER — Inpatient Hospital Stay (HOSPITAL_COMMUNITY)

## 2023-12-16 ENCOUNTER — Inpatient Hospital Stay (HOSPITAL_COMMUNITY)
Admission: EM | Admit: 2023-12-16 | Discharge: 2023-12-20 | DRG: 062 | Disposition: A | Discharge status: Home / Self Care | Attending: Neurology | Admitting: Neurology

## 2023-12-16 ENCOUNTER — Other Ambulatory Visit: Payer: Self-pay

## 2023-12-16 ENCOUNTER — Encounter (HOSPITAL_COMMUNITY): Payer: Self-pay | Admitting: Emergency Medicine

## 2023-12-16 ENCOUNTER — Encounter: Payer: Self-pay | Admitting: Pharmacist

## 2023-12-16 DIAGNOSIS — I63549 Cerebral infarction due to unspecified occlusion or stenosis of unspecified cerebellar artery: Secondary | ICD-10-CM | POA: Diagnosis present

## 2023-12-16 DIAGNOSIS — R29810 Facial weakness: Secondary | ICD-10-CM | POA: Diagnosis present

## 2023-12-16 DIAGNOSIS — R29704 NIHSS score 4: Secondary | ICD-10-CM | POA: Diagnosis not present

## 2023-12-16 DIAGNOSIS — R29702 NIHSS score 2: Secondary | ICD-10-CM | POA: Diagnosis not present

## 2023-12-16 DIAGNOSIS — F1410 Cocaine abuse, uncomplicated: Secondary | ICD-10-CM | POA: Diagnosis present

## 2023-12-16 DIAGNOSIS — Z9841 Cataract extraction status, right eye: Secondary | ICD-10-CM

## 2023-12-16 DIAGNOSIS — Z7982 Long term (current) use of aspirin: Secondary | ICD-10-CM | POA: Diagnosis not present

## 2023-12-16 DIAGNOSIS — H8120 Vestibular neuronitis, unspecified ear: Secondary | ICD-10-CM | POA: Diagnosis present

## 2023-12-16 DIAGNOSIS — Z79899 Other long term (current) drug therapy: Secondary | ICD-10-CM

## 2023-12-16 DIAGNOSIS — Z800 Family history of malignant neoplasm of digestive organs: Secondary | ICD-10-CM | POA: Diagnosis not present

## 2023-12-16 DIAGNOSIS — E871 Hypo-osmolality and hyponatremia: Secondary | ICD-10-CM | POA: Diagnosis present

## 2023-12-16 DIAGNOSIS — Z87891 Personal history of nicotine dependence: Secondary | ICD-10-CM | POA: Diagnosis not present

## 2023-12-16 DIAGNOSIS — R2990 Unspecified symptoms and signs involving the nervous system: Principal | ICD-10-CM | POA: Insufficient documentation

## 2023-12-16 DIAGNOSIS — I639 Cerebral infarction, unspecified: Principal | ICD-10-CM | POA: Diagnosis present

## 2023-12-16 DIAGNOSIS — I10 Essential (primary) hypertension: Secondary | ICD-10-CM | POA: Diagnosis present

## 2023-12-16 DIAGNOSIS — Z9079 Acquired absence of other genital organ(s): Secondary | ICD-10-CM

## 2023-12-16 DIAGNOSIS — H8121 Vestibular neuronitis, right ear: Secondary | ICD-10-CM | POA: Diagnosis not present

## 2023-12-16 DIAGNOSIS — I161 Hypertensive emergency: Secondary | ICD-10-CM | POA: Diagnosis present

## 2023-12-16 DIAGNOSIS — Z9842 Cataract extraction status, left eye: Secondary | ICD-10-CM

## 2023-12-16 DIAGNOSIS — Z860100 Personal history of colon polyps, unspecified: Secondary | ICD-10-CM | POA: Diagnosis not present

## 2023-12-16 DIAGNOSIS — H02402 Unspecified ptosis of left eyelid: Secondary | ICD-10-CM | POA: Diagnosis present

## 2023-12-16 DIAGNOSIS — Z8616 Personal history of COVID-19: Secondary | ICD-10-CM | POA: Diagnosis not present

## 2023-12-16 DIAGNOSIS — Z823 Family history of stroke: Secondary | ICD-10-CM

## 2023-12-16 DIAGNOSIS — R29818 Other symptoms and signs involving the nervous system: Secondary | ICD-10-CM | POA: Diagnosis not present

## 2023-12-16 DIAGNOSIS — H547 Unspecified visual loss: Secondary | ICD-10-CM | POA: Diagnosis present

## 2023-12-16 DIAGNOSIS — H409 Unspecified glaucoma: Secondary | ICD-10-CM | POA: Diagnosis present

## 2023-12-16 DIAGNOSIS — I6389 Other cerebral infarction: Secondary | ICD-10-CM | POA: Diagnosis not present

## 2023-12-16 DIAGNOSIS — Z8673 Personal history of transient ischemic attack (TIA), and cerebral infarction without residual deficits: Secondary | ICD-10-CM | POA: Diagnosis not present

## 2023-12-16 DIAGNOSIS — Z981 Arthrodesis status: Secondary | ICD-10-CM

## 2023-12-16 DIAGNOSIS — H532 Diplopia: Secondary | ICD-10-CM | POA: Diagnosis present

## 2023-12-16 DIAGNOSIS — H5509 Other forms of nystagmus: Secondary | ICD-10-CM | POA: Diagnosis present

## 2023-12-16 DIAGNOSIS — D72819 Decreased white blood cell count, unspecified: Secondary | ICD-10-CM | POA: Diagnosis present

## 2023-12-16 DIAGNOSIS — R569 Unspecified convulsions: Secondary | ICD-10-CM

## 2023-12-16 DIAGNOSIS — F1010 Alcohol abuse, uncomplicated: Secondary | ICD-10-CM | POA: Diagnosis present

## 2023-12-16 DIAGNOSIS — K5900 Constipation, unspecified: Secondary | ICD-10-CM | POA: Diagnosis present

## 2023-12-16 DIAGNOSIS — E785 Hyperlipidemia, unspecified: Secondary | ICD-10-CM | POA: Insufficient documentation

## 2023-12-16 LAB — I-STAT CHEM 8, ED
BUN: 15 mg/dL (ref 8–23)
Calcium, Ion: 1.17 mmol/L (ref 1.15–1.40)
Chloride: 98 mmol/L (ref 98–111)
Creatinine, Ser: 1.1 mg/dL (ref 0.61–1.24)
Glucose, Bld: 103 mg/dL — ABNORMAL HIGH (ref 70–99)
HCT: 30 % — ABNORMAL LOW (ref 39.0–52.0)
Hemoglobin: 10.2 g/dL — ABNORMAL LOW (ref 13.0–17.0)
Potassium: 3.9 mmol/L (ref 3.5–5.1)
Sodium: 135 mmol/L (ref 135–145)
TCO2: 25 mmol/L (ref 22–32)

## 2023-12-16 LAB — CBC
HCT: 29.1 % — ABNORMAL LOW (ref 39.0–52.0)
Hemoglobin: 9.6 g/dL — ABNORMAL LOW (ref 13.0–17.0)
MCH: 29 pg (ref 26.0–34.0)
MCHC: 33 g/dL (ref 30.0–36.0)
MCV: 87.9 fL (ref 80.0–100.0)
Platelets: 142 K/uL — ABNORMAL LOW (ref 150–400)
RBC: 3.31 MIL/uL — ABNORMAL LOW (ref 4.22–5.81)
RDW: 15.2 % (ref 11.5–15.5)
WBC: 2.8 K/uL — ABNORMAL LOW (ref 4.0–10.5)
nRBC: 0 % (ref 0.0–0.2)

## 2023-12-16 LAB — ECHOCARDIOGRAM COMPLETE
Area-P 1/2: 3.43 cm2
Height: 67 in
S' Lateral: 3.2 cm
Weight: 2194.02 [oz_av]

## 2023-12-16 LAB — PROTIME-INR
INR: 1.1 (ref 0.8–1.2)
Prothrombin Time: 15.1 s (ref 11.4–15.2)

## 2023-12-16 LAB — DIFFERENTIAL
Abs Immature Granulocytes: 0.01 K/uL (ref 0.00–0.07)
Basophils Absolute: 0 K/uL (ref 0.0–0.1)
Basophils Relative: 1 %
Eosinophils Absolute: 0 K/uL (ref 0.0–0.5)
Eosinophils Relative: 1 %
Immature Granulocytes: 0 %
Lymphocytes Relative: 54 %
Lymphs Abs: 1.6 K/uL (ref 0.7–4.0)
Monocytes Absolute: 0.3 K/uL (ref 0.1–1.0)
Monocytes Relative: 11 %
Neutro Abs: 0.9 K/uL — ABNORMAL LOW (ref 1.7–7.7)
Neutrophils Relative %: 33 %
Smear Review: NORMAL

## 2023-12-16 LAB — ETHANOL: Alcohol, Ethyl (B): <15 mg/dL (ref <15)

## 2023-12-16 LAB — RAPID URINE DRUG SCREEN, HOSP PERFORMED
Amphetamines: NOT DETECTED
Barbiturates: NOT DETECTED
Benzodiazepines: NOT DETECTED
Cocaine: NOT DETECTED
Opiates: NOT DETECTED
Tetrahydrocannabinol: NOT DETECTED

## 2023-12-16 LAB — COMPREHENSIVE METABOLIC PANEL WITH GFR
ALT: 10 U/L (ref 0–44)
AST: 19 U/L (ref 15–41)
Albumin: 3.9 g/dL (ref 3.5–5.0)
Alkaline Phosphatase: 26 U/L — ABNORMAL LOW (ref 38–126)
Anion gap: 5 (ref 5–15)
BUN: 13 mg/dL (ref 8–23)
CO2: 24 mmol/L (ref 22–32)
Calcium: 8.6 mg/dL — ABNORMAL LOW (ref 8.9–10.3)
Chloride: 101 mmol/L (ref 98–111)
Creatinine, Ser: 1 mg/dL (ref 0.61–1.24)
GFR, Estimated: >60 mL/min (ref >60)
Glucose, Bld: 104 mg/dL — ABNORMAL HIGH (ref 70–99)
Potassium: 3.8 mmol/L (ref 3.5–5.1)
Sodium: 130 mmol/L — ABNORMAL LOW (ref 135–145)
Total Bilirubin: 0.7 mg/dL (ref 0.0–1.2)
Total Protein: 6.8 g/dL (ref 6.5–8.1)

## 2023-12-16 LAB — CBG MONITORING, ED: Glucose-Capillary: 107 mg/dL — ABNORMAL HIGH (ref 70–99)

## 2023-12-16 LAB — APTT: aPTT: 27 s (ref 24–36)

## 2023-12-16 LAB — HEMOGLOBIN A1C
Hgb A1c MFr Bld: 5.1 % (ref 4.8–5.6)
Mean Plasma Glucose: 99.67 mg/dL

## 2023-12-16 LAB — MRSA NEXT GEN BY PCR, NASAL: MRSA by PCR Next Gen: NOT DETECTED

## 2023-12-16 MED ORDER — CYCLOSPORINE 0.05 % OP EMUL
1.0000 [drp] | Freq: Two times a day (BID) | OPHTHALMIC | Status: DC
  Administered 2023-12-16 – 2023-12-20 (×7): 1 [drp] via OPHTHALMIC
  Filled 2023-12-16 (×11): qty 30

## 2023-12-16 MED ORDER — SODIUM CHLORIDE 0.9 % IV SOLN: INTRAVENOUS | Status: DC

## 2023-12-16 MED ORDER — SENNOSIDES-DOCUSATE SODIUM 8.6-50 MG PO TABS: 1.0000 | ORAL_TABLET | Freq: Every evening | ORAL | Status: DC | PRN

## 2023-12-16 MED ORDER — IOHEXOL 350 MG/ML SOLN
100.0000 mL | Freq: Once | INTRAVENOUS | Status: AC | PRN
Start: 2023-12-16 — End: 2023-12-16
  Administered 2023-12-16: 100 mL via INTRAVENOUS

## 2023-12-16 MED ORDER — LABETALOL HCL 5 MG/ML IV SOLN
10.0000 mg | Freq: Once | INTRAVENOUS | Status: AC
  Administered 2023-12-16: 10 mg via INTRAVENOUS

## 2023-12-16 MED ORDER — CLEVIDIPINE BUTYRATE 0.5 MG/ML IV EMUL
0.0000 mg/h | INTRAVENOUS | Status: DC
  Administered 2023-12-16: 2 mg/h via INTRAVENOUS

## 2023-12-16 MED ORDER — HYDRALAZINE HCL 20 MG/ML IJ SOLN
10.0000 mg | INTRAMUSCULAR | Status: DC | PRN
  Administered 2023-12-17: 10 mg via INTRAVENOUS
  Filled 2023-12-16: qty 1

## 2023-12-16 MED ORDER — HYDRALAZINE HCL 20 MG/ML IJ SOLN
20.0000 mg | Freq: Once | INTRAMUSCULAR | Status: AC
  Administered 2023-12-16: 20 mg via INTRAVENOUS

## 2023-12-16 MED ORDER — BRIMONIDINE TARTRATE 0.15 % OP SOLN
1.0000 [drp] | Freq: Two times a day (BID) | OPHTHALMIC | Status: DC
  Administered 2023-12-16 – 2023-12-20 (×9): 1 [drp] via OPHTHALMIC
  Filled 2023-12-16: qty 5

## 2023-12-16 MED ORDER — LABETALOL HCL 5 MG/ML IV SOLN: 10.0000 mg | INTRAVENOUS | Status: DC | PRN

## 2023-12-16 MED ORDER — TENECTEPLASE FOR STROKE
0.2500 mg/kg | PACK | Freq: Once | INTRAVENOUS | Status: AC
  Administered 2023-12-16: 16 mg via INTRAVENOUS
  Filled 2023-12-16: qty 10

## 2023-12-16 MED ORDER — ACETAMINOPHEN 325 MG PO TABS
650.0000 mg | ORAL_TABLET | ORAL | Status: DC | PRN
  Administered 2023-12-18: 650 mg via ORAL
  Filled 2023-12-16 (×2): qty 2

## 2023-12-16 MED ORDER — ORAL CARE MOUTH RINSE: 15.0000 mL | OROMUCOSAL | Status: DC | PRN

## 2023-12-16 MED ORDER — PANTOPRAZOLE SODIUM 40 MG IV SOLR
40.0000 mg | Freq: Every day | INTRAVENOUS | Status: DC
  Administered 2023-12-16: 40 mg via INTRAVENOUS
  Filled 2023-12-16: qty 10

## 2023-12-16 MED ORDER — ACETAMINOPHEN 160 MG/5ML PO SOLN: 650.0000 mg | ORAL | Status: DC | PRN

## 2023-12-16 MED ORDER — STROKE: EARLY STAGES OF RECOVERY BOOK
Freq: Once | Status: AC
  Filled 2023-12-16: qty 1

## 2023-12-16 MED ORDER — LATANOPROST 0.005 % OP SOLN
1.0000 [drp] | Freq: Every day | OPHTHALMIC | Status: DC
  Administered 2023-12-16 – 2023-12-19 (×4): 1 [drp] via OPHTHALMIC
  Filled 2023-12-16: qty 2.5

## 2023-12-16 MED ORDER — SODIUM CHLORIDE 0.9% FLUSH
3.0000 mL | Freq: Once | INTRAVENOUS | Status: AC
  Administered 2023-12-16: 3 mL via INTRAVENOUS

## 2023-12-16 MED ORDER — CHLORHEXIDINE GLUCONATE CLOTH 2 % EX PADS
6.0000 | MEDICATED_PAD | Freq: Every day | CUTANEOUS | Status: DC
  Administered 2023-12-16 – 2023-12-17 (×2): 6 via TOPICAL

## 2023-12-16 MED ORDER — ONDANSETRON HCL 4 MG/2ML IJ SOLN: 4.0000 mg | Freq: Once | INTRAMUSCULAR | Status: DC

## 2023-12-16 MED ORDER — ACETAMINOPHEN 650 MG RE SUPP: 650.0000 mg | RECTAL | Status: DC | PRN

## 2023-12-16 MED ORDER — LEVETIRACETAM 250 MG PO TABS
250.0000 mg | ORAL_TABLET | Freq: Two times a day (BID) | ORAL | Status: DC
Start: 2023-12-16 — End: 2023-12-17
  Administered 2023-12-16 (×2): 250 mg via ORAL
  Filled 2023-12-16 (×3): qty 1

## 2023-12-16 NOTE — Plan of Care (Signed)
 Patient admitted for stroke workup. NIH on admission to ICU was a 4. After follow-up head CT obtained, bedside swallow exam performed and passed. Orders updated to reflect.

## 2023-12-16 NOTE — Progress Notes (Signed)
 SLP Cancellation Note  Patient Details Name: Evan Escobar MRN: [id number] DOB: [birth year]   Cancelled treatment:       Reason Eval/Treat Not Completed: Patient at procedure or test/unavailable (receiving nursing care followed by EEG setup). SLP will f/u as schedule permits to conduct cognitive-linguistic evaluation.    [name], M.A., CCC-SLP Speech Language Pathology, Acute Rehabilitation Services  Secure Chat preferred [phone]  12/16/2023, 4:02 PM

## 2023-12-16 NOTE — ED Notes (Signed)
 Cleviprex stopped at 1130, blood pressure within goal range.

## 2023-12-16 NOTE — H&P (Addendum)
 NEUROLOGY H&P NOTE   Date of service: December 16, 2023 Patient Name: Evan Escobar MRN:  [id number] DOB:  09/18/1940 Chief Complaint: Acute onset dizziness  History of Present Illness  Evan Escobar is a 83 y.o. male with hx of seizure, cocaine use, alcohol use, glaucoma and hypertension who presents with sudden onset dizziness and nystagmus.  Patient reports that he had gone to the store to get some milk of magnesia to treat constipation and went into the bathroom.  When he exited the bathroom, he noticed sudden onset dizziness.  His wife then brought him to the emergency department for evaluation.   Of note, patient states that he has been having diplopia, blurred vision and difficulty seeing out of his right eye for about the last 3 weeks but denies other symptoms. The dizziness and eye movement abnormalities were sudden onset around the LKW time.     Last known well: 0955 Modified rankin score: 1-No significant post stroke disability and can perform usual duties with stroke symptoms IV Thrombolysis: Yes, given at 1116 Thrombectomy: No, no LVO  NIHSS components Score: Comment  1a Level of Conscious 0[x]  1[]  2[]  3[]      1b LOC Questions 0[]  1[x]  2[]       1c LOC Commands 0[x]  1[]  2[]       2 Best Gaze 0[]  1[x]  2[]       3 Visual 0[x]  1[]  2[]  3[]      4 Facial Palsy 0[x]  1[]  2[]  3[]      5a Motor Arm - left 0[x]  1[]  2[]  3[]  4[]  UN[]    5b Motor Arm - Right 0[x]  1[]  2[]  3[]  4[]  UN[]    6a Motor Leg - Left 0[x]  1[]  2[]  3[]  4[]  UN[]    6b Motor Leg - Right 0[x]  1[]  2[]  3[]  4[]  UN[]    7 Limb Ataxia 0[x]  1[]  2[]  UN[]      8 Sensory 0[x]  1[]  2[]  UN[]      9 Best Language 0[]  1[]  2[]  3[]      10 Dysarthria 0[x]  1[]  2[]  UN[]      11 Extinct. and Inattention 0[x]  1[]  2[]       TOTAL:2       ROS  Comprehensive ROS performed and pertinent positives documented in the HPI  Past History   Past Medical History:  Diagnosis Date   Allergy    Arthritis    Cataract    Glaucoma    Hypertension     Normocytic anemia 09/25/2012   Polyp, sigmoid colon 11/05/2011   Seizure (HCC)    Seizures (HCC) 03/17/2013   Urinary incontinence 01/18/2023   Past Surgical History:  Procedure Laterality Date   ANTERIOR CERVICAL DECOMP/DISCECTOMY FUSION N/A 05/22/2017   Procedure: Anterior Cervical Discectomy Fusion - Cervical Three-Cervical Four - Cervical Four-Cervical Five;  Surgeon: Onetha Kuba, MD;  Location: [facility] OR;  Service: Neurosurgery;  Laterality: N/A;   CATARACT EXTRACTION, BILATERAL     EYE SURGERY     GLAUCOMA REPAIR  10 yrs ago   TRANSURETHRAL RESECTION OF PROSTATE N/A 08/06/2022   Procedure: TRANSURETHRAL RESECTION OF THE PROSTATE (TURP) UNROOFING PROSTATIC ABSCESS;  Surgeon: Watt Rush, MD;  Location: [facility] ORS;  Service: Urology;  Laterality: N/A;  1 HR FOR CASE   Family History  Problem Relation Age of Onset   Stroke Mother    High blood pressure Mother    Stroke Father    High blood pressure Father    Colon cancer Brother    Esophageal cancer Neg Hx    Pancreatic cancer Neg Hx  Rectal cancer Neg Hx    Stomach cancer Neg Hx    Social History   Socioeconomic History   Marital status: Married    Spouse name: [name]   Number of children: 1   Years of education: 11   Highest education level: Not on file  Occupational History   Occupation: retired  Tobacco Use   Smoking status: Former    Current packs/day: 0.00    Types: Cigarettes    Quit date: 02/18/2007    Years since quitting: 16.8   Smokeless tobacco: Never   Tobacco comments:    not sure but a while ago; quit smoking and ETOH  Vaping Use   Vaping status: Never Used  Substance and Sexual Activity   Alcohol use: No    Alcohol/week: 0.0 standard drinks of alcohol    Comment: quit in 2009   Drug use: No   Sexual activity: Not Currently  Other Topics Concern   Not on file  Social History Narrative   Patient lives at home with his wife [name] Patient is retired. Patient has 11 th grade education.    Caffeine- one cup  of coffee daily and one soda.   Right handed.   Social Drivers of Corporate Investment Banker Strain: Low Risk  (09/28/2023)   Overall Financial Resource Strain (CARDIA)    Difficulty of Paying Living Expenses: Not hard at all  Food Insecurity: No Food Insecurity (09/28/2023)   Hunger Vital Sign    Worried About Running Out of Food in the Last Year: Never true    Ran Out of Food in the Last Year: Never true  Transportation Needs: No Transportation Needs (09/28/2023)   PRAPARE - Administrator, Civil Service (Medical): No    Lack of Transportation (Non-Medical): No  Physical Activity: Sufficiently Active (09/28/2023)   Exercise Vital Sign    Days of Exercise per Week: 3 days    Minutes of Exercise per Session: 60 min  Stress: No Stress Concern Present (09/28/2023)   [employer] of Occupational Health - Occupational Stress Questionnaire    Feeling of Stress: Not at all  Social Connections: Moderately Integrated (09/28/2023)   Social Connection and Isolation Panel    Frequency of Communication with Friends and Family: More than three times a week    Frequency of Social Gatherings with Friends and Family: Never    Attends Religious Services: More than 4 times per year    Active Member of [employer] or Organizations: No    Attends Engineer, Structural: Never    Marital Status: Married   No Known Allergies  Medications   Current Facility-Administered Medications:    [START ON 12/17/2023]  stroke: early stages of recovery book, , Does not apply, Once, Voncile Isles, MD   0.9 %  sodium chloride  infusion, , Intravenous, Continuous, Balin Vandegrift, MD, Last Rate: 40 mL/hr at 12/16/23 1240, New Bag at 12/16/23 1240   acetaminophen  (TYLENOL ) tablet 650 mg, 650 mg, Oral, Q4H PRN **OR** acetaminophen  (TYLENOL ) 160 MG/5ML solution 650 mg, 650 mg, Per Tube, Q4H PRN **OR** acetaminophen  (TYLENOL ) suppository 650 mg, 650 mg, Rectal, Q4H PRN, Akacia Boltz, MD   brimonidine  (ALPHAGAN) 0.15 % ophthalmic solution 1 drop, 1 drop, Both Eyes, BID, de Clint Kill, Pickens E, NP   Chlorhexidine  Gluconate Cloth 2 % PADS 6 each, 6 each, Topical, Daily, Nil Xiong, MD   clevidipine (CLEVIPREX) infusion 0.5 mg/mL, 0-21 mg/hr, Intravenous, Continuous, Gaines Carrier, RPH, Last Rate: 4  mL/hr at 12/16/23 1131, 2 mg/hr at 12/16/23 1131   cycloSPORINE  (RESTASIS ) 0.05 % ophthalmic emulsion 1 drop, 1 drop, Both Eyes, BID, de La Torre, Cortney E, NP   latanoprost  (XALATAN ) 0.005 % ophthalmic solution 1 drop, 1 drop, Both Eyes, QHS, de La Torre, Cortney E, NP   levETIRAcetam  (KEPPRA ) tablet 250 mg, 250 mg, Oral, BID, de La Torre, Cortney E, NP   ondansetron  (ZOFRAN ) injection 4 mg, 4 mg, Intravenous, Once, Kammerer, Megan L, DO   Oral care mouth rinse, 15 mL, Mouth Rinse, PRN, Tanequa Kretz, MD   pantoprazole  (PROTONIX ) injection 40 mg, 40 mg, Intravenous, QHS, Krysti Hickling, MD   senna-docusate (Senokot-S) tablet 1 tablet, 1 tablet, Oral, QHS PRN, Sydna Brodowski, MD   sodium chloride  flush (NS) 0.9 % injection 3 mL, 3 mL, Intravenous, Once, Kammerer, Megan L, DO   Vitals   Vitals:   12/16/23 1155 12/16/23 1200 12/16/23 1205 12/16/23 1215  BP: (!) 139/90 (!) 144/54 (!) 136/56 (!) 151/69  Pulse: 84 85 81 86  Resp: (!) 22 19 (!) 23 (!) 21  Temp:    (!) 97.5 F (36.4 C)  TempSrc:    Oral  SpO2: 100% 100% 100% 100%  Weight:    62.2 kg  Height:    5' 7 (1.702 m)     Body mass index is 21.48 kg/m.  Physical Exam   Constitutional: Appears well-developed and well-nourished.  Psych: Affect appropriate to situation.  Eyes: No scleral injection.  HENT: No OP obstruction.  Head: Normocephalic.  Cardiovascular: Normal rate and regular rhythm, S1-S2.  Respiratory: Effort normal, non-labored breathing, lungs clear to anterior auscultation.  GI: Soft.  No distension. There is no tenderness.  Skin: WDI.   Neurologic Examination    NEURO:  Mental Status: Alert and oriented to  person place time and situation, gives wrong month but correct year, complaining of continued dizziness Speech/Language: speech is without dysarthria or aphasia.  Naming, repetition, fluency, and comprehension intact.  Cranial Nerves:  II: PERRL. Visual fields full.   III, IV, VI: EOMI. Eyelids elevate symmetrically.  Florid horizontal nystagmus in all directions of eye movement and on neutral gaze, diplopia and poor visual acuity V: Sensation is intact to light touch and symmetrical to face.  VII: Smile is symmetrical.  VIII: hearing intact to voice. IX, X: Phonation is normal.  XII: tongue is midline without fasciculations. Motor: 5/5 strength to all muscle groups tested.  Tone: is normal and bulk is normal Sensation- Intact to light touch bilaterally. Extinction absent to light touch to DSS.  Coordination: FTN intact bilaterally, heel-knee-shin intact bilaterally Gait- deferred   Labs   CBC:  Recent Labs  Lab 12/16/23 1059 12/16/23 1120  WBC 2.8*  --   NEUTROABS 0.9*  --   HGB 9.6* 10.2*  HCT 29.1* 30.0*  MCV 87.9  --   PLT 142*  --    Basic Metabolic Panel:  Lab Results  Component Value Date   NA 135 12/16/2023   K 3.9 12/16/2023   CO2 24 12/16/2023   GLUCOSE 103 (H) 12/16/2023   BUN 15 12/16/2023   CREATININE 1.10 12/16/2023   CALCIUM 8.6 (L) 12/16/2023   GFRNONAA >60 12/16/2023   GFRAA 62 10/13/2019   Lipid Panel:  Lab Results  Component Value Date   LDLCALC 101 (H) 04/07/2022   HgbA1c:  Lab Results  Component Value Date   HGBA1C 5.8 01/10/2019   Alcohol Level     Component Value Date/Time  ETH <15 12/16/2023 1059   INR  Lab Results  Component Value Date   INR 1.1 12/16/2023   APTT  Lab Results  Component Value Date   APTT 27 12/16/2023     CT Head without contrast(Personally reviewed): No acute abnormality, age-related atrophy and mild white matter disease, 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 male with hx of seizure, cocaine use, alcohol use, glaucoma and hypertension who presents with sudden onset dizziness and florid horizontal nystagmus.   Patient reports that symptoms began suddenly when he was exiting the bathroom this morning.   He reports some visual changes over the last few weeks and constipation but no other recent symptoms.    Given prominent sudden onset dizziness and nystagmus, hight concern for posterior circulation stroke - TNK was offered after careful discussion of risks, benefits and alternatives.  He agreed and TNK was administered after BP control, which caused delay in TNK administration.  Multifocal stenosis in the posterior circulation was seen on CT angiogram.  Patient will need admission to the ICU for post TNK monitoring.  CODE STATUS was discussed with patient, and he would like to remain full code at this time.  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  Labetalol  and hydralazine  or IV Cleviprex if needed - MRI brain wo contrast in 24 hours - CTA/MRA if not already obtained -Yes - TTE  - Check A1c and LDL + add statin per guidelines - antiplt/anticoag to be started 24 hours after TNK administration - NIHSS and vital signs every 15 minutes x 2 hours, every 30 minutes x 6 hours and then hourly thereafter - STAT head CT for any change in neuro exam - Tele - PT/OT/SLP - Stroke  education -Was on Keppra  at some point-does not take Keppra  as he has not had a seizure in a long while.  Will hold antiepileptics for now. - Amb referral to neurology upon discharge     Full code ______________________________________________________________________ Patient seen by NP with MD, MD to addend note as needed.  Signed, Cortney E Everitt Clint Kill, NP [facility]    Attending Neurohospitalist Addendum Patient seen and examined with APP/Resident. Agree with the history and physical as documented above. Agree with the plan as documented, which I helped formulate. I have independently reviewed the chart, obtained history, review of systems and examined the patient.I have personally reviewed pertinent head/neck/spine imaging (CT/MRI).   Agree with the history and physical above Sudden onset of dizziness and diplopia although has been complaining of some blurred vision from the right eye over the past week. Teasing out history, the current symptoms that brought him to the hospital were of sudden onset around the time of last known well. Risks benefits and alternatives of IV TNKase were discussed and he agreed to proceed. CT head was negative for acute process including no evidence of bleed-was reviewed prior to TNK administration.  Blood pressure needed to be controlled prior to TNK administration.  Some delay. He was transferred to the ICU from the ER where he had some concern for worsening of his NIH stroke scale to 4. Stat CT head was repeated-preliminary review negative for acute process Continue vitals and neurochecks per post TNK protocol. Rest of the recommendations as above. Stroke team to follow Plan discussed with Dr. Remonia EDP  Please feel free to call with any questions.  -- Eligio Lav, MD Neurologist [facility] Pager: [phone]   Risks, benefits and alternatives of IVT discussed with patient and/or family and they agreed. CTH  personally reviewed prior to TNK administration   CRITICAL CARE ATTESTATION Performed by: Eligio Lav, MD Total critical care time: 68 minutes Critical care time was exclusive of separately billable procedures and treating other patients and/or supervising APPs/Residents/Students Critical care was necessary to treat or prevent imminent or life-threatening deterioration. This patient is critically ill and at significant risk for neurological worsening and/or death and care requires constant monitoring. Critical care was time spent personally by me on the following activities: development of treatment plan with patient and/or surrogate as well as nursing, discussions with consultants, evaluation of patient's response to treatment, examination of patient, obtaining history from patient or surrogate, ordering and performing treatments and interventions, ordering and review of laboratory studies, ordering and review of radiographic studies, pulse oximetry, re-evaluation of patient's condition, participation in multidisciplinary rounds and medical decision making of high complexity in the care of this patient.

## 2023-12-16 NOTE — ED Triage Notes (Signed)
 Pt reports blurry vision, dizziness, vomiting, and balance issues that started an hour ago. Nystagmus present upon observation.

## 2023-12-16 NOTE — Progress Notes (Signed)
 Dr. Voncile notified of increase in NIH. STAT CT ordered and obtained.

## 2023-12-16 NOTE — Progress Notes (Signed)
 Pharmacy Quality Measure Review  This patient is appearing on a report for being at risk of failing the adherence measure for hypertension (ACEi/ARB) medications this calendar year.   Medication: lisinopril  Last fill date: 11/27/23 for 90 day supply  Insurance report was not up to date. No action needed at this time.   Darrelyn Drum, PharmD, BCPS, CPP Clinical Pharmacist Practitioner [facility] [phone]

## 2023-12-16 NOTE — ED Notes (Signed)
 Report given to [facility] RN - 4N ICU

## 2023-12-16 NOTE — ED Provider Notes (Signed)
 [place] [facility] DE[facility] AT [facility] Provider Note   CSN: [id number] Arrival date & time: 12/16/23  1042     Patient presents with: Code Stroke   Evan Escobar is a 83 y.o. male.   83 year old male presents for evaluation of dizziness that started 1 hour ago.  States he has blurry vision for a while but it is worse after he started feeling dizzy an hour ago.  States he threw up in the bathroom, feels like he is moving and is having trouble with balance as well.  States his vision changes have gotten worse.  He states he initially had a headache but that is improved.  He is on aspirin  and no other anticoagulation.  Denies any other symptoms or concerns at this time.  He is a poor historian.  Wife at bedside does state that symptoms started an hour ago and patient has notable nystagmus today that is persistent which she has never had before        Prior to Admission medications   Medication Sig Start Date End Date Taking? Authorizing Provider  acetaminophen  (TYLENOL ) 500 MG tablet Take 500 mg by mouth every 6 (six) hours as needed for moderate pain or headache.     [provider]  aspirin  81 MG tablet Take 81 mg by mouth daily.    [provider]  azithromycin  (ZITHROMAX ) 250 MG tablet Sig as indicated 09/28/23   Purcell Emil Schanz, MD  benzonatate  (TESSALON ) 100 MG capsule Take 1 capsule (100 mg total) by mouth every 8 (eight) hours. 09/24/23   Darral Longs, MD  Blood Pressure Monitor DEVI 1 each by Does not apply route daily. 12/09/23   Rollene Almarie LABOR, MD  brimonidine (ALPHAGAN P) 0.1 % SOLN Place 1 drop into both eyes 2 (two) times daily.    [provider]  cycloSPORINE  (RESTASIS ) 0.05 % ophthalmic emulsion Place 1 drop into both eyes 2 (two) times daily.    [provider]  latanoprost  (XALATAN ) 0.005 % ophthalmic solution Place 1 drop into both eyes at bedtime. 09/20/12   [provider]  levETIRAcetam   (KEPPRA ) 250 MG tablet Take 1 tablet (250 mg total) by mouth 2 (two) times daily. APPOINTMENT NEEDED FOR FURTHER REFILLS 06/04/23   Onita Duos, MD  linaclotide  (LINZESS ) 145 MCG CAPS capsule Take 1 capsule (145 mcg total) by mouth daily before breakfast. 08/25/23   [name] ORN, MD  lisinopril  (ZESTRIL ) 20 MG tablet TAKE 1 TABLET(20 MG) BY MOUTH DAILY 05/08/23   Rollene Almarie LABOR, MD  magnesium  hydroxide (MILK OF MAGNESIA) 400 MG/5ML suspension Take 30 mLs by mouth daily as needed for mild constipation.    [provider]  methocarbamol  (ROBAXIN ) 500 MG tablet Take 1 tablet (500 mg total) by mouth every 8 (eight) hours as needed for muscle spasms. 03/26/23   Rollene Almarie LABOR, MD  Multiple Vitamin (MULTIVITAMIN) tablet Take 1 tablet by mouth daily.    [provider]  polyethylene glycol (MIRALAX ) 17 g packet Take 17 g by mouth daily. 11/15/22   Theadore Ozell HERO, MD  tamsulosin  (FLOMAX ) 0.4 MG CAPS capsule TAKE 1 CAPSULE(0.4 MG) BY MOUTH IN THE MORNING AND AT BEDTIME. Please schedule appointment with PCP 11/30/23   Rollene Almarie LABOR, MD    Allergies: Patient has no known allergies.    Review of Systems  Constitutional:  Negative for chills and fever.  HENT:  Negative for ear pain and sore throat.   Eyes:  Positive for  visual disturbance. Negative for pain.  Respiratory:  Negative for cough and shortness of breath.   Cardiovascular:  Negative for chest pain and palpitations.  Gastrointestinal:  Negative for abdominal pain and vomiting.  Genitourinary:  Negative for dysuria and hematuria.  Musculoskeletal:  Negative for arthralgias and back pain.  Skin:  Negative for color change and rash.  Neurological:  Positive for dizziness and headaches. Negative for seizures and syncope.  All other systems reviewed and are negative.   Updated Vital Signs BP (!) 143/68   Pulse 78   Temp (!) 97.5 F (36.4 C) (Oral)   Resp 16   Ht 5' 7 (1.702 m)   Wt 62.2 kg   SpO2 100%    BMI 21.48 kg/m   Physical Exam Vitals and nursing note reviewed.  Constitutional:      General: He is not in acute distress.    Appearance: Normal appearance. He is well-developed. He is ill-appearing.  HENT:     Head: Normocephalic and atraumatic.  Eyes:     Conjunctiva/sclera: Conjunctivae normal.  Cardiovascular:     Rate and Rhythm: Normal rate and regular rhythm.     Heart sounds: No murmur heard. Pulmonary:     Effort: Pulmonary effort is normal. No respiratory distress.     Breath sounds: Normal breath sounds.  Abdominal:     Palpations: Abdomen is soft.     Tenderness: There is no abdominal tenderness.  Musculoskeletal:        General: No swelling.     Cervical back: Neck supple.  Skin:    General: Skin is warm and dry.     Capillary Refill: Capillary refill takes less than 2 seconds.  Neurological:     Mental Status: He is alert.     Comments: Patient is actively vomiting, he has muscle strength that is 4 out of 5 throughout and no sensation deficit but he has a very difficult time with finger nose test with some past-pointing which is worse on the left than the right, he also has persistent nystagmus in all directions, and even at rest just looking forward  Psychiatric:        Mood and Affect: Mood normal.     (all labs ordered are listed, but only abnormal results are displayed) Labs Reviewed  CBC - Abnormal; Notable for the following components:      Result Value   WBC 2.8 (*)    RBC 3.31 (*)    Hemoglobin 9.6 (*)    HCT 29.1 (*)    Platelets 142 (*)    All other components within normal limits  DIFFERENTIAL - Abnormal; Notable for the following components:   Neutro Abs 0.9 (*)    All other components within normal limits  COMPREHENSIVE METABOLIC PANEL WITH GFR - Abnormal; Notable for the following components:   Sodium 130 (*)    Glucose, Bld 104 (*)    Calcium 8.6 (*)    Alkaline Phosphatase 26 (*)    All other components within normal limits  CBG  MONITORING, ED - Abnormal; Notable for the following components:   Glucose-Capillary 107 (*)    All other components within normal limits  I-STAT CHEM 8, ED - Abnormal; Notable for the following components:   Glucose, Bld 103 (*)    Hemoglobin 10.2 (*)    HCT 30.0 (*)    All other components within normal limits  MRSA NEXT GEN BY PCR, NASAL  PROTIME-INR  APTT  ETHANOL  HEMOGLOBIN  A1C  RAPID URINE DRUG SCREEN, HOSP PERFORMED  CBG MONITORING, ED    EKG: None  Radiology: CT HEAD WO CONTRAST ( ) Result Date: 12/16/2023 EXAM: CT HEAD WITHOUT CONTRAST 12/16/2023 01:10:27 PM TECHNIQUE: CT of the head was performed without the administration of intravenous contrast. Automated exposure control, iterative reconstruction, and/or weight based adjustment of the mA/kV was utilized to reduce the radiation dose to as low as reasonably achievable. COMPARISON: 12/16/2023 CLINICAL HISTORY: Stroke, follow up. FINDINGS: BRAIN AND VENTRICLES: No acute hemorrhage. No evidence of acute infarct. Similar small remote lacunar infarcts in right basal ganglia. Similar appearance of mild age related parenchymal volume loss. Mild chronic microvascular ischemic changes. Residual contrast within venous sinuses from recent CT abdomen pelvis. No hydrocephalus. No extra-axial collection. No mass effect or midline shift. ORBITS: No acute abnormality. SINUSES: Trace right nasal effusion. SOFT TISSUES AND SKULL: No acute soft tissue abnormality. No skull fracture. IMPRESSION: 1. No acute intracranial abnormality. Electronically signed by: Donnice Mania MD 12/16/2023 02:27 PM EDT RP Workstation: [id number]   CT ANGIO HEAD NECK W WO CM (CODE STROKE) Result Date: 12/16/2023 EXAM: CTA Head and Neck with Intravenous Contrast. CT Head without Contrast. CLINICAL HISTORY: Stroke, follow up. Code stroke. TECHNIQUE: Axial CTA images of the head and neck performed with intravenous contrast. MIP reconstructed images were created and  reviewed. Axial computed tomography images of the head/brain performed without intravenous contrast. Note: Per PQRS, the description of internal carotid artery percent stenosis, including 0 percent or normal exam, is based on [employer] American Symptomatic Carotid Endarterectomy Trial (NASCET) criteria. Dose reduction technique was used including one or more of the following: automated exposure control, adjustment of mA and kV according to patient size, and/or iterative reconstruction. CONTRAST: Without and with; 100 mL iohexol  (OMNIPAQUE ) 350 MG/ML injection. COMPARISON: CT of the head dated 12/16/2023. FINDINGS: CT HEAD: BRAIN: No acute intraparenchymal hemorrhage. No mass lesion. No CT evidence for acute territorial infarct. No midline shift or extra-axial collection. VENTRICLES: No hydrocephalus. ORBITS: The orbits are unremarkable. SINUSES AND MASTOIDS: The paranasal sinuses and mastoid air cells are clear. CTA NECK: COMMON CAROTID ARTERIES: No significant stenosis. No dissection or occlusion. INTERNAL CAROTID ARTERIES: No stenosis by NASCET criteria. No dissection or occlusion. 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 The V2 and V3 segments are otherwise normal in caliber. There is near occlusive stenosis of the V4 segment of the left vertebral artery distal to the takeoff of the posterior inferior cerebellar artery. The right vertebral artery demonstrates moderate stenosis at its origin, but otherwise appears normal in caliber. No dissection. CTA HEAD: ANTERIOR CEREBRAL ARTERIES: No significant stenosis. No occlusion. No aneurysm. MIDDLE CEREBRAL ARTERIES: No significant stenosis. No occlusion. No aneurysm. POSTERIOR CEREBRAL ARTERIES: No significant stenosis. No occlusion. No aneurysm. BASILAR ARTERY: No significant stenosis. No occlusion. No aneurysm. 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 The above findings were communicated to Dr. Arora at  11:43 am 12/16/2023. SOFT TISSUES: No acute finding. No masses or lymphadenopathy. BONES: No acute osseous abnormality. IMPRESSION: 1. 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 2. 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 3. Complete Circle of Willis. 4. Critical results communicated to Dr. Arora at 11:43 am on 12/16/23. Electronically signed by: Evalene Coho MD 12/16/2023 11:47 AM EDT RP Workstation:  [id number]   CT HEAD CODE STROKE WO CONTRAST Result Date: 12/16/2023 EXAM: CT HEAD WITHOUT 12/16/2023 11:03:27 AM TECHNIQUE: CT of the head was performed without the administration of intravenous contrast. Automated exposure control, iterative reconstruction, and/or weight based adjustment of the mA/kV was utilized to reduce the radiation dose to as low as reasonably achievable. COMPARISON: CT of the head dated 03/17/2023. CLINICAL HISTORY: Neuro deficit, acute, stroke suspected. Dizziness; LKW 0900; MD Voncile. FINDINGS: BRAIN AND VENTRICLES: No acute intracranial hemorrhage. No mass effect or midline shift. No extra-axial fluid collection. No evidence of acute infarct. No hydrocephalus. Age-related atrophy and mild cerebral white matter disease. Chronic lacunar infarcts are again noted within the right basal ganglia and right thalamus. ORBITS: No acute abnormality. SINUSES AND MASTOIDS: No acute abnormality. SOFT TISSUES AND SKULL: No acute skull fracture. No acute soft tissue abnormality. [place] Stroke Program Early CT Score (ASPECTS) ----- Ganglionic (caudate, IC, lentiform nucleus, insula, M1-M3): 7 Supraganglionic (M4-M6): 3 Total: 10 The above findings were communicated to Dr. Voncile at 11:09 am 12/16/2023. IMPRESSION: 1. No acute intracranial abnormality related to the suspected stroke. 2. Age-related  atrophy and mild cerebral white matter disease. 3. 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 4. ASPECTS: 10. Electronically signed by: Evalene Coho MD 12/16/2023 11:12 AM EDT RP Workstation: [id number]     Procedures   Medications Ordered in the ED  ondansetron  (ZOFRAN ) injection 4 mg (4 mg Intravenous Not Given 12/16/23 1215)  clevidipine (CLEVIPREX) infusion 0.5 mg/mL (0 mg/hr Intravenous Stopped 12/16/23 1139)   stroke: early stages of recovery book (has no administration in time range)  0.9 %  sodium chloride  infusion ( Intravenous Infusion Verify 12/16/23 1500)  acetaminophen  (TYLENOL ) tablet 650 mg (has no administration in time range)    Or  acetaminophen  (TYLENOL ) 160 MG/5ML solution 650 mg (has no administration in time range)    Or  acetaminophen  (TYLENOL ) suppository 650 mg (has no administration in time range)  senna-docusate (Senokot-S) tablet 1 tablet (has no administration in time range)  pantoprazole  (PROTONIX ) injection 40 mg (has no administration in time range)  Oral care mouth rinse (has no administration in time range)  Chlorhexidine  Gluconate Cloth 2 % PADS 6 each (6 each Topical Given 12/16/23 1220)  levETIRAcetam  (KEPPRA ) tablet 250 mg (250 mg Oral Given 12/16/23 1334)  latanoprost  (XALATAN ) 0.005 % ophthalmic solution 1 drop (has no administration in time range)  cycloSPORINE  (RESTASIS ) 0.05 % ophthalmic emulsion 1 drop (has no administration in time range)  brimonidine (ALPHAGAN) 0.15 % ophthalmic solution 1 drop (1 drop Both Eyes Given 12/16/23 1324)  labetalol  (NORMODYNE ) injection 10 mg (has no administration in time range)  hydrALAZINE  (APRESOLINE ) injection 10 mg (has no administration in time range)  sodium chloride  flush (NS) 0.9 % injection 3 mL (3 mLs Intravenous Given 12/16/23 1245)  tenecteplase (TNKASE) injection for Stroke 16 mg (16 mg Intravenous Given 12/16/23 1116)  iohexol  (OMNIPAQUE ) 350 MG/ML injection 100 mL  (100 mLs Intravenous Contrast Given 12/16/23 1131)  labetalol  (NORMODYNE ) injection 10 mg (10 mg Intravenous Given by Other 12/16/23 1113)  hydrALAZINE  (APRESOLINE ) injection 20 mg (20 mg Intravenous Given by Other 12/16/23 1124)                                    Medical Decision Making Cardiac monitor interpretation: Sinus rhythm, no ectopy  Patient seen by neurology immediately after I called a code  stroke and he was given TNK.  Will be taken to the ICU admitted under neuroteam.  Patient with significant nystagmus and likely acute CVA.  I spoke with Dr. Deedra, neurology attending who will admit the patient  Problems Addressed: Acute CVA (cerebrovascular accident) [facility]): acute illness or injury that poses a threat to life or bodily functions  Amount and/or Complexity of Data Reviewed External Data Reviewed: notes.    Details: Prior ED records reviewed and patient seen 8 - 20 - 25 for weakness Labs: ordered. Decision-making details documented in ED Course.    Details: Ordered and reviewed by me and unremarkable Radiology: ordered and independent interpretation performed. Decision-making details documented in ED Course.    Details: Ordered and reviewed by me and discussed with radiology and neurology and CT head is negative for acute ICH ECG/medicine tests: ordered and independent interpretation performed. Decision-making details documented in ED Course.    Details: Ordered and interpreted by me in the absence of cardiology and shows sinus rhythm, no STEMI or significant change when compared to prior Discussion of management or test interpretation with external provider(s): Dr. Lyndon spoke with him regarding the patient they will admit to the ICU after he was given TNK in the ER  Risk OTC drugs. Prescription drug management. Drug therapy requiring intensive monitoring for toxicity. Decision regarding hospitalization. Risk Details: CRITICAL CARE Performed by: Duwaine LITTIE Fusi   Total critical care time: 30 minutes  Critical care time was exclusive of separately billable procedures and treating other patients.  Critical care was necessary to treat or prevent imminent or life-threatening deterioration.  Critical care was time spent personally by me on the following activities: development of treatment plan with patient and/or surrogate as well as nursing, discussions with consultants, evaluation of patient's response to treatment, examination of patient, obtaining history from patient or surrogate, ordering and performing treatments and interventions, ordering and review of laboratory studies, ordering and review of radiographic studies, pulse oximetry and re-evaluation of patient's condition.   Critical Care Total time providing critical care: 30 minutes     Final diagnoses:  Acute CVA (cerebrovascular accident) [facility])    ED Discharge Orders     None          Fusi Duwaine LITTIE, DO 12/16/23 1506

## 2023-12-16 NOTE — Progress Notes (Signed)
 PHARMACIST CODE STROKE RESPONSE  Notified to mix TNK at 1109 by Dr. Voncile TNK preparation completed at 1111  TNK dose = 16 mg IV over 5 seconds.   Issues/delays encountered (if applicable): SBP requiring HTN medication administration   Madailein Londo 12/16/23 11:16 AM

## 2023-12-16 NOTE — Code Documentation (Signed)
 Stroke Response Nurse Documentation Code Documentation  Dragon Thrush is a 83 y.o. male arriving to [place]  via Private Vehicle on 12-16-2023 with past medical hx of seizure, glaucoma, HTN. On No antithrombotic. Code stroke was activated by ED.   Patient from home where he was LKW at 0945 and now complaining of dizziness and unsteady gait and odd eye movement.  Per he and his wife he walked into the bathroom at 0945 and was his normal self. When he came out of the bathroom he was dizzy, and had difficulty walking, felt that his eyes were moving oddly, and had N&V  Stroke team at the bedside on patient arrival. Labs drawn and patient cleared for CT by Dr. Gennaro. Patient to CT with team.He is dizzy and has nystagmus.  NIHSS 1, see documentation for details and code stroke times. Patient with disoriented on exam. The following imaging was completed:  CT Head and CTA. Patient is a candidate for IV Thrombolytic, given at 1116. Patient is not a candidate for IR due to no LVO on CTA.  10mg  Labetalol  given at 1113 prior to TNK 20mg  Hydralazine  given at 1124 post TNK   Care Plan: VS & NIHSS q 15 min x 2 hours then q 30 min X 6 hours then q 1 hour x 16 hours.    Bedside handoff with ED RN Mariel.    Elvin Portland  Stroke Response RN

## 2023-12-16 NOTE — Progress Notes (Signed)
 EEG complete. Results pending.  ?

## 2023-12-17 ENCOUNTER — Inpatient Hospital Stay (HOSPITAL_COMMUNITY)

## 2023-12-17 DIAGNOSIS — R2990 Unspecified symptoms and signs involving the nervous system: Secondary | ICD-10-CM | POA: Insufficient documentation

## 2023-12-17 DIAGNOSIS — R569 Unspecified convulsions: Secondary | ICD-10-CM | POA: Diagnosis not present

## 2023-12-17 DIAGNOSIS — Z7982 Long term (current) use of aspirin: Secondary | ICD-10-CM

## 2023-12-17 DIAGNOSIS — H8120 Vestibular neuronitis, unspecified ear: Secondary | ICD-10-CM

## 2023-12-17 DIAGNOSIS — R29818 Other symptoms and signs involving the nervous system: Secondary | ICD-10-CM

## 2023-12-17 DIAGNOSIS — Z87891 Personal history of nicotine dependence: Secondary | ICD-10-CM

## 2023-12-17 DIAGNOSIS — I639 Cerebral infarction, unspecified: Secondary | ICD-10-CM | POA: Diagnosis not present

## 2023-12-17 DIAGNOSIS — H8121 Vestibular neuronitis, right ear: Secondary | ICD-10-CM

## 2023-12-17 DIAGNOSIS — I10 Essential (primary) hypertension: Secondary | ICD-10-CM | POA: Diagnosis not present

## 2023-12-17 LAB — LIPID PANEL
Cholesterol: 178 mg/dL (ref 0–200)
HDL: 73 mg/dL (ref >40)
LDL Cholesterol: 100 mg/dL — ABNORMAL HIGH (ref 0–99)
Total CHOL/HDL Ratio: 2.4 ratio
Triglycerides: 23 mg/dL (ref <150)
VLDL: 5 mg/dL (ref 0–40)

## 2023-12-17 LAB — GLUCOSE, CAPILLARY: Glucose-Capillary: 94 mg/dL (ref 70–99)

## 2023-12-17 MED ORDER — PREDNISONE 5 MG PO TABS
50.0000 mg | ORAL_TABLET | Freq: Every day | ORAL | Status: DC
  Administered 2023-12-18 – 2023-12-20 (×3): 50 mg via ORAL
  Filled 2023-12-17 (×3): qty 2

## 2023-12-17 MED ORDER — ASPIRIN 81 MG PO TBEC
81.0000 mg | DELAYED_RELEASE_TABLET | Freq: Every day | ORAL | Status: DC
  Administered 2023-12-17 – 2023-12-20 (×4): 81 mg via ORAL
  Filled 2023-12-17 (×5): qty 1

## 2023-12-17 MED ORDER — AMLODIPINE BESYLATE 5 MG PO TABS
10.0000 mg | ORAL_TABLET | Freq: Every day | ORAL | Status: DC
  Administered 2023-12-17 – 2023-12-20 (×4): 10 mg via ORAL
  Filled 2023-12-17 (×3): qty 2
  Filled 2023-12-17: qty 1

## 2023-12-17 MED ORDER — ONDANSETRON HCL 4 MG/2ML IJ SOLN: 4.0000 mg | Freq: Four times a day (QID) | INTRAMUSCULAR | Status: DC | PRN

## 2023-12-17 MED ORDER — ATORVASTATIN CALCIUM 40 MG PO TABS
40.0000 mg | ORAL_TABLET | Freq: Every day | ORAL | Status: DC
Start: 2023-12-17 — End: 2023-12-17
  Administered 2023-12-17: 40 mg via ORAL
  Filled 2023-12-17: qty 1

## 2023-12-17 MED ORDER — ENSURE PLUS HIGH PROTEIN PO LIQD
237.0000 mL | Freq: Two times a day (BID) | ORAL | Status: DC
  Administered 2023-12-17 – 2023-12-20 (×7): 237 mL via ORAL

## 2023-12-17 MED ORDER — OXYCODONE-ACETAMINOPHEN 5-325 MG PO TABS: 1.0000 | ORAL_TABLET | Freq: Three times a day (TID) | ORAL | Status: DC | PRN

## 2023-12-17 MED ORDER — HYDRALAZINE HCL 20 MG/ML IJ SOLN: 10.0000 mg | INTRAMUSCULAR | Status: DC | PRN

## 2023-12-17 MED ORDER — POLYETHYLENE GLYCOL 3350 17 G PO PACK
17.0000 g | PACK | Freq: Every day | ORAL | Status: DC
  Administered 2023-12-17 – 2023-12-20 (×4): 17 g via ORAL
  Filled 2023-12-17 (×4): qty 1

## 2023-12-17 MED ORDER — LISINOPRIL 20 MG PO TABS
20.0000 mg | ORAL_TABLET | Freq: Every day | ORAL | Status: DC
Start: 2023-12-17 — End: 2023-12-20
  Administered 2023-12-17 – 2023-12-20 (×4): 20 mg via ORAL
  Filled 2023-12-17 (×4): qty 1

## 2023-12-17 MED ORDER — LABETALOL HCL 5 MG/ML IV SOLN: 10.0000 mg | INTRAVENOUS | Status: DC | PRN

## 2023-12-17 MED ORDER — MECLIZINE HCL 25 MG PO TABS
25.0000 mg | ORAL_TABLET | Freq: Three times a day (TID) | ORAL | Status: DC
  Administered 2023-12-17 – 2023-12-20 (×9): 25 mg via ORAL
  Filled 2023-12-17 (×11): qty 1

## 2023-12-17 NOTE — Evaluation (Signed)
 Speech Language Pathology Evaluation Patient Details Name: Evan Escobar MRN: [id number] DOB: Jun 22, 1940 Today's Date: 12/17/2023 Time: 8546-8494 SLP Time Calculation (min) (ACUTE ONLY): 12 min  Problem List:  Patient Active Problem List   Diagnosis Date Noted   Acute ischemic stroke (HCC) 12/16/2023   COVID-19 10/05/2023   Weakness 10/05/2023   Other fatigue 10/05/2023   Hyponatremia 10/05/2023   COVID-19 virus infection 09/28/2023   Lower respiratory infection 09/28/2023   Flu-like symptoms 09/28/2023   Chest congestion 09/28/2023   Dog bite 09/08/2023   Need for tetanus booster 09/08/2023   Bilateral impacted cerumen 08/27/2023   Hx of completed stroke 03/27/2023   Urinary incontinence 01/18/2023   Prostate abscess 08/04/2022   Anemia of chronic disease 08/04/2022   History of recurrent UTIs 05/12/2022   Toenail fungus 07/30/2021   Deficiency anemia 10/13/2019   Persistent cough 10/13/2019   Chronic idiopathic constipation 10/13/2019   Cervical radiculopathy 01/10/2019   Left lumbar radiculopathy 12/09/2017   Finger pain, right 10/06/2017   Hearing loss 09/09/2016   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   Encounter for general adult medical examination with abnormal findings 08/28/2015   Allergic rhinitis 02/26/2015   Glaucoma 05/07/2013   Essential hypertension 05/07/2013   Seizures (HCC) 03/17/2013   Past Medical History:  Past Medical History:  Diagnosis Date   Allergy    Arthritis    Cataract    Glaucoma    Hypertension    Normocytic anemia 09/25/2012   Polyp, sigmoid colon 11/05/2011   Seizure (HCC)    Seizures (HCC) 03/17/2013   Urinary incontinence 01/18/2023   Past Surgical History:  Past Surgical History:  Procedure Laterality Date   ANTERIOR CERVICAL DECOMP/DISCECTOMY FUSION N/A 05/22/2017   Procedure: Anterior Cervical Discectomy Fusion - Cervical Three-Cervical Four - Cervical Four-Cervical Five;  Surgeon: Onetha Kuba, MD;   Location: [facility] OR;  Service: Neurosurgery;  Laterality: N/A;   CATARACT EXTRACTION, BILATERAL     EYE SURGERY     GLAUCOMA REPAIR  10 yrs ago   TRANSURETHRAL RESECTION OF PROSTATE N/A 08/06/2022   Procedure: TRANSURETHRAL RESECTION OF THE PROSTATE (TURP) UNROOFING PROSTATIC ABSCESS;  Surgeon: Watt Rush, MD;  Location: [facility] ORS;  Service: Urology;  Laterality: N/A;  1 HR FOR CASE   HPI:  83 yo male presents to [facility] on 10/29 for blurred vision, dizziness, n/v, balance impairment. 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 S/p TNK 1100 10/29. PMH includes seizures, cocaine use, ETOH use, glaucoma, HTN, ACDF   Assessment / Plan / Recommendation Clinical Impression  Pt reports that he is generally independent at home, where he lives with his spouse. He presents with acute deficits related to problem solving, reasoning, and memory that are suspected to be further impacted by hearing and vision. Pt could immediately recall 3/4 novel words but this decreased to 1/4 after a delay, even when given semantic cueing. He had difficulty with simple calculations, though states this is not an acute change. Reasoning appears impaired, particularly when related to more abstract concepts. Given level of baseline independence, recommend ongoing SLP f/u to target deficits listed above. Will continue following.    SLP Assessment  SLP Recommendation/Assessment: Patient needs continued Speech Language Pathology Services SLP Visit Diagnosis: Cognitive communication deficit (R41.841)     Assistance Recommended at Discharge  Frequent or constant Supervision/Assistance   Functional Status Assessment Patient has had a recent decline in their functional status and demonstrates the ability to make significant improvements in function in a reasonable and predictable amount of time.  Frequency and Duration min 2x/week  2 weeks      SLP Evaluation Cognition  Overall Cognitive Status: Impaired/Different from baseline Arousal/Alertness: Awake/alert Orientation Level: Oriented X4 Attention: Sustained Sustained Attention: Appears intact Memory: Impaired Memory Impairment: Storage deficit;Retrieval deficit Awareness: Impaired Awareness Impairment: Emergent impairment Problem Solving: Impaired Problem Solving Impairment: Verbal basic Executive Function: Reasoning Reasoning: Impaired Reasoning Impairment: Verbal basic       Comprehension  Auditory Comprehension Overall Auditory Comprehension: Appears within functional limits for tasks assessed    Expression Expression Primary Mode of Expression: Verbal Verbal Expression Overall Verbal Expression: Appears within functional limits for tasks assessed   Oral / Motor  Oral Motor/Sensory Function Overall Oral Motor/Sensory Function: Within functional limits Motor Speech Overall Motor Speech: Appears within functional limits for tasks assessed            Evan Escobar, M.A., CCC-SLP Speech Language Pathology, Acute Rehabilitation Services  Secure Chat preferred [phone]  12/17/2023, 4:29 PM

## 2023-12-17 NOTE — Procedures (Addendum)
 Patient Name: Evan Escobar  MRN: [id number]  Epilepsy Attending: Arlin MALVA Krebs  Referring Physician/Provider: everitt Clint Abbey Earle FORBES, NP  Date: 12/16/2023 Duration: 22.30 mins  Patient history: 83 y.o. male with hx of seizure, cocaine use, alcohol use, glaucoma and hypertension who presents with sudden onset dizziness and nystagmus. EEG to evaluate for seizure  Level of alertness: Awake  AEDs during EEG study: LEV  Technical aspects: This EEG study was done with scalp electrodes positioned according to the 10-20 International system of electrode placement. Electrical activity was reviewed with band pass filter of 1-70Hz , sensitivity of 7 uV/mm, display speed of 49mm/sec with a 60Hz  notched filter applied as appropriate. EEG data were recorded continuously and digitally stored.  Video monitoring was available and reviewed as appropriate.  Description: The posterior dominant rhythm consists of 8Hz  activity of moderate voltage (25-35 uV) seen predominantly in posterior head regions, symmetric and reactive to eye opening and eye closing. Hyperventilation and photic stimulation were not performed.     IMPRESSION: This study is within normal limits. No seizures or epileptiform discharges were seen throughout the recording.  A normal interictal EEG does not exclude the diagnosis of epilepsy.   Evan Escobar

## 2023-12-17 NOTE — TOC CAGE-AID Note (Signed)
 Transition of Care [facility]) - CAGE-AID Screening   Patient Details  Name: Ashante Snelling MRN: [id number] Date of Birth: 10-Jul-1940  [facility]) CM/SW Contact:    Loreli Debruler E Addisen Chappelle, LCSW Phone Number: 12/17/2023, 9:27 AM   Clinical Narrative: No SA noted.   CAGE-AID Screening:    Have You Ever Felt You Ought to Cut Down on Your Drinking or Drug Use?: No Have People Annoyed You By Critizing Your Drinking Or Drug Use?: No Have You Felt Bad Or Guilty About Your Drinking Or Drug Use?: No Have You Ever Had a Drink or Used Drugs First Thing In The Morning to Steady Your Nerves or to Get Rid of a Hangover?: No CAGE-AID Score: 0  Substance Abuse Education Offered: No

## 2023-12-17 NOTE — Evaluation (Signed)
 Physical Therapy Evaluation Patient Details Name: Evan Escobar MRN: [id number] DOB: November 06, 1940 Today's Date: 12/17/2023  History of Present Illness  83 yo male presents to [facility] on 10/29 for blurred vision, dizziness, n/v, balance impairment. 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 S/p TNK 1100 10/29. PMH includes seizures, cocaine use, ETOH use, glaucoma, HTN, ACDF.  Clinical Impression   Pt presents with significant horizontal nystagmus at rest and worse with smooth pursuits, impaired balance, inability to progress to gait given impaired standing balance and + dizziness with nausea. Pt to benefit from acute PT to address deficits. Pt requiring light assist for bed mobility and transition into standing, PT instructed pt to fixate gaze on target but did not help, nystagmus constant throughout session. Patient will benefit from intensive inpatient follow-up therapy, >3 hours/day. PT to progress mobility as tolerated, and will continue to follow acutely.          If plan is discharge home, recommend the following: A lot of help with walking and/or transfers;A lot of help with bathing/dressing/bathroom   Can travel by private vehicle        Equipment Recommendations None recommended by PT  Recommendations for Other Services       Functional Status Assessment Patient has had a recent decline in their functional status and demonstrates the ability to make significant improvements in function in a reasonable and predictable amount of time.     Precautions / Restrictions Precautions Precautions: Fall Precaution/Restrictions Comments:  resting horizontal nystagmus, L beat with L gaze and R beat with R gaze Restrictions Weight Bearing Restrictions Per Provider Order: No      Mobility  Bed Mobility Overal bed mobility: Needs Assistance Bed Mobility: Supine to Sit, Sit to Supine     Supine to sit: Contact guard Sit to supine: Min assist   General bed mobility comments: assist for LE lift back into bed, boost up assist.    Transfers Overall transfer level: Needs assistance Equipment used: 1 person hand held assist Transfers: Sit to/from Stand Sit to Stand: Min assist           General transfer comment: assist to rise and steady, once standing pt very unsteady taking x1 step forward but limited from further given dizziness, nausea.    Ambulation/Gait               General Gait Details: unable given nystagmus with dizziness and nausea  Stairs            Wheelchair Mobility     Tilt Bed    Modified Rankin (Stroke Patients Only) Modified Rankin (Stroke Patients Only) Pre-Morbid Rankin Score: No significant disability Modified Rankin: Severe disability     Balance Overall balance assessment: Needs assistance Sitting-balance support: No upper extremity supported, Feet supported Sitting balance-Leahy Scale: Fair     Standing balance support: Single extremity supported, During functional activity Standing balance-Leahy Scale: Poor Standing balance comment: reliant on PT for steadying assist                             Pertinent Vitals/Pain Pain Assessment Pain Assessment: No/denies pain  Home Living Family/patient expects to be discharged to:: Private residence Living Arrangements: Spouse/significant other Available Help at Discharge: Family;Available 24 hours/day Type of Home: House Home Access: Stairs to enter Entrance Stairs-Rails: None Entrance Stairs-Number of Steps: 2   Home Layout: One level Home Equipment: Cane - single Librarian, Academic (2 wheels)       Prior Function Prior Level of Function : Independent/Modified Independent                     Extremity/Trunk Assessment   Upper Extremity Assessment Upper Extremity Assessment: Defer to OT evaluation    Lower Extremity Assessment Lower Extremity Assessment: Overall WFL for tasks assessed (at least 4/5 all motions, no changes to sensation)    Cervical / Trunk Assessment Cervical / Trunk Assessment: Normal  Communication   Communication Communication: No apparent difficulties    Cognition Arousal: Alert Behavior During Therapy: WFL for tasks assessed/performed                           PT - Cognition Comments: WFL for tasks assessed Following commands: Impaired Following commands impaired: Follows one step commands with increased time     Cueing Cueing Techniques: Gestural cues, Verbal cues     General Comments General comments (skin integrity, edema, etc.): smooth pursuits with nystagmus in direction of gaze, + dizziness with horizontal tracking. Unable to tolerate much vertical tracking given symptomatic dizziness. Finger to nose impaired suspect due to nystagmus.    Exercises     Assessment/Plan    PT Assessment Patient needs continued PT services  PT Problem List Decreased mobility;Decreased activity tolerance;Decreased balance;Decreased knowledge of use of DME;Cardiopulmonary status limiting activity;Decreased safety awareness;Decreased strength       PT Treatment Interventions Therapeutic activities;DME instruction;Gait training;Therapeutic exercise;Patient/family education;Balance training;Stair training;Functional mobility training;Neuromuscular re-education    PT Goals (Current goals can be found in the Care Plan section)  Acute Rehab PT Goals PT Goal Formulation: With patient Time For Goal Achievement: 12/31/23 Potential to Achieve Goals: Good    Frequency Min 3X/week     Co-evaluation               AM-PAC PT 6 Clicks  Mobility  Outcome Measure Help needed turning from your back to your side while in a flat bed without using bedrails?: A Little Help needed moving from lying on your back to sitting on the side of a flat bed without using bedrails?: A Little Help needed moving to and from a bed to a chair (including a wheelchair)?: A Lot Help needed standing up from a chair using your arms (e.g., wheelchair or bedside chair)?: A Lot Help needed to walk in hospital room?: Total Help needed climbing 3-5 steps with a railing? : Total 6 Click Score: 12    End of Session Equipment Utilized During Treatment: Gait belt Activity Tolerance: Treatment limited secondary to medical complications (Comment) (constant nystagmus) Patient left: in bed;with bed alarm set;with call bell/phone within reach;with family/visitor present;with nursing/sitter in room Nurse Communication: Mobility status PT Visit Diagnosis: Unsteadiness on feet (R26.81);Difficulty in walking, not elsewhere classified (R26.2)    Time: 1200-1230 PT Time Calculation (min) (ACUTE ONLY): 30 min   Charges:   PT Evaluation $PT Eval Low Complexity: 1 Low PT Treatments $Therapeutic Activity: 8-22 mins PT General Charges $$ ACUTE PT VISIT: 1 Visit         Shalona Harbour S, PT DPT Acute Rehabilitation Services Secure Chat Preferred  Office  [phone]   Karina Lenderman E Stroup 12/17/2023, 1:50 PM

## 2023-12-17 NOTE — Progress Notes (Addendum)
 STROKE TEAM PROGRESS NOTE    SIGNIFICANT HOSPITAL EVENTS  10/29: Presented with sudden onset dizziness and horizontal nystagmus  TNK given @ 1116  INTERIM HISTORY/SUBJECTIVE  Routine Eeg negative RN and family at bedside.  3 week history of monocular (L) double vision, has improved now.  Continues to have unilateral direction nystagmus, right facial droop, no drift in any extremity.  MRI today negative.  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  Will transfer out of ICU.   OBJECTIVE  CBC    Component Value Date/Time   WBC 2.8 (L) 12/16/2023 1059   RBC 3.31 (L) 12/16/2023 1059   HGB 10.2 (L) 12/16/2023 1120   HCT 30.0 (L) 12/16/2023 1120   PLT 142 (L) 12/16/2023 1059   MCV 87.9 12/16/2023 1059   MCH 29.0 12/16/2023 1059   MCHC 33.0 12/16/2023 1059   RDW 15.2 12/16/2023 1059   LYMPHSABS 1.6 12/16/2023 1059   MONOABS 0.3 12/16/2023 1059   EOSABS 0.0 12/16/2023 1059   BASOSABS 0.0 12/16/2023 1059    BMET    Component Value Date/Time   NA 135 12/16/2023 1120   K 3.9 12/16/2023 1120   CL 98 12/16/2023 1120   CO2 24 12/16/2023 1059   GLUCOSE 103 (H) 12/16/2023 1120   BUN 15 12/16/2023 1120   CREATININE 1.10 12/16/2023 1120   CREATININE 1.28 (H) 10/13/2019 1532   CALCIUM 8.6 (L) 12/16/2023 1059   GFRNONAA >60 12/16/2023 1059   GFRNONAA 53 (L) 10/13/2019 1532    IMAGING past 24 hours ECHOCARDIOGRAM COMPLETE Result Date: 12/16/2023    ECHOCARDIOGRAM REPORT   Patient Name:   Evan Escobar Date of Exam: 12/16/2023 Medical Rec #:  [id number]      Height:       67.0 in Accession #:    [id number]     Weight:       137.1 lb Date of Birth:  07-Jun-1940       BSA:          1.723 m Patient Age:    83 years       BP:           149/71 mmHg Patient Gender: M              HR:           81 bpm. Exam Location:  Inpatient Procedure: 2D Echo, 3D Echo, Cardiac Doppler, Color Doppler and Strain Analysis            (Both Spectral and Color Flow Doppler were utilized during             procedure). Indications:    Stroke  History:        Patient has no prior history of Echocardiogram examinations.                 Risk Factors:Hypertension.  Sonographer:    Philomena Daring Referring Phys: [id number] ASHISH ARORA  Sonographer Comments: Global longitudinal strain was attempted. IMPRESSIONS  1. Left ventricular ejection fraction, by estimation, is 60 to 65%. The left ventricle has normal function. The left ventricle has no regional wall motion abnormalities. There is mild left ventricular hypertrophy. Left ventricular diastolic parameters are consistent with Grade I diastolic dysfunction (impaired relaxation). The average left ventricular global longitudinal strain is -17.7 %. The global longitudinal strain is abnormal.  2. Right ventricular systolic function is normal. The right ventricular size is normal. Tricuspid regurgitation signal is inadequate for assessing PA pressure.  3. The mitral  valve is abnormal. Trivial mitral valve regurgitation.  4. The aortic valve has an indeterminant number of cusps. Aortic valve regurgitation is not visualized.  5. The inferior vena cava is normal in size with greater than 50% respiratory variability, suggesting right atrial pressure of 3 mmHg. Comparison(s): No prior Echocardiogram. FINDINGS  Left Ventricle: Left ventricular ejection fraction, by estimation, is 60 to 65%. The left ventricle has normal function. The left ventricle has no regional wall motion abnormalities. The average left ventricular global longitudinal strain is -17.7 %. Strain was performed and the global longitudinal strain is abnormal. 3D ejection fraction reviewed and evaluated as part of the interpretation. Alternate measurement of EF is felt to be most reflective of LV function. The left ventricular internal cavity  size was normal in size. There is mild left ventricular hypertrophy. Left ventricular diastolic parameters are consistent with Grade I diastolic dysfunction (impaired  relaxation). Indeterminate filling pressures. Right Ventricle: The right ventricular size is normal. No increase in right ventricular wall thickness. Right ventricular systolic function is normal. Tricuspid regurgitation signal is inadequate for assessing PA pressure. Left Atrium: Left atrial size was normal in size. Right Atrium: Right atrial size was normal in size. Pericardium: There is no evidence of pericardial effusion. Mitral Valve: The mitral valve is abnormal. There is mild thickening of the anterior and posterior mitral valve leaflet(s). Trivial mitral valve regurgitation. Tricuspid Valve: The tricuspid valve is normal in structure. Tricuspid valve regurgitation is not demonstrated. Aortic Valve: The aortic valve has an indeterminant number of cusps. Aortic valve regurgitation is not visualized. Pulmonic Valve: The pulmonic valve was normal in structure. Pulmonic valve regurgitation is not visualized. Aorta: The aortic root and ascending aorta are structurally normal, with no evidence of dilitation. Venous: The inferior vena cava is normal in size with greater than 50% respiratory variability, suggesting right atrial pressure of 3 mmHg. IAS/Shunts: No atrial level shunt detected by color flow Doppler. Additional Comments: 3D was performed not requiring image post processing on an independent workstation and was normal.  LEFT VENTRICLE PLAX 2D LVIDd:         4.70 cm   Diastology LVIDs:         3.20 cm   LV e' medial:    8.16 cm/s LV PW:         1.10 cm   LV E/e' medial:  9.5 LV IVS:        1.10 cm   LV e' lateral:   8.38 cm/s LVOT diam:     2.10 cm   LV E/e' lateral: 9.3 LV SV:         94 LV SV Index:   55        2D Longitudinal Strain LVOT Area:     3.46 cm  2D Strain GLS (A4C):   -18.1 % LV IVRT:       63 msec   2D Strain GLS (A3C):   -19.4 %                          2D Strain GLS (A2C):   -15.6 %                          2D Strain GLS Avg:     -17.7 %                           3D  Volume EF:                           3D EF:        56 %                          LV EDV:       127 ml                          LV ESV:       56 ml                          LV SV:        71 ml RIGHT VENTRICLE             IVC RV S prime:     19.90 cm/s  IVC diam: 1.40 cm TAPSE (M-mode): 2.1 cm LEFT ATRIUM             Index        RIGHT ATRIUM           Index LA diam:        3.50 cm 2.03 cm/m   RA Area:     12.00 cm LA Vol (A2C):   47.1 ml 27.34 ml/m  RA Volume:   24.40 ml  14.17 ml/m LA Vol (A4C):   39.1 ml 22.70 ml/m LA Biplane Vol: 43.0 ml 24.96 ml/m  AORTIC VALVE LVOT Vmax:   126.00 cm/s LVOT Vmean:  82.300 cm/s LVOT VTI:    0.272 m  AORTA Ao Root diam: 3.00 cm Ao Asc diam:  3.00 cm MITRAL VALVE MV Area (PHT): 3.43 cm     SHUNTS MV Decel Time: 221 msec     Systemic VTI:  0.27 m MV E velocity: 77.60 cm/s   Systemic Diam: 2.10 cm MV A velocity: 130.00 cm/s MV E/A ratio:  0.60 Vinie Maxcy MD Electronically signed by Vinie Maxcy MD Signature Date/Time: 12/16/2023/4:35:42 PM    Final    CT HEAD WO CONTRAST ( ) Result Date: 12/16/2023 EXAM: CT HEAD WITHOUT CONTRAST 12/16/2023 01:10:27 PM TECHNIQUE: CT of the head was performed without the administration of intravenous contrast. Automated exposure control, iterative reconstruction, and/or weight based adjustment of the mA/kV was utilized to reduce the radiation dose to as low as reasonably achievable. COMPARISON: 12/16/2023 CLINICAL HISTORY: Stroke, follow up. FINDINGS: BRAIN AND VENTRICLES: No acute hemorrhage. No evidence of acute infarct. Similar small remote lacunar infarcts in right basal ganglia. Similar appearance of mild age related parenchymal volume loss. Mild chronic microvascular ischemic changes. Residual contrast within venous sinuses from recent CT abdomen pelvis. No hydrocephalus. No extra-axial collection. No mass effect or midline shift. ORBITS: No acute abnormality. SINUSES: Trace right nasal effusion. SOFT TISSUES AND SKULL: No acute soft tissue abnormality.  No skull fracture. IMPRESSION: 1. No acute intracranial abnormality. Electronically signed by: Donnice Mania MD 12/16/2023 02:27 PM EDT RP Workstation: [id number]   CT ANGIO HEAD NECK W WO CM (CODE STROKE) Result Date: 12/16/2023 EXAM: CTA Head and Neck with Intravenous Contrast. CT Head without Contrast. CLINICAL HISTORY: Stroke, follow up. Code stroke. TECHNIQUE: Axial CTA images of the head and neck performed with intravenous contrast. MIP reconstructed images were created and reviewed. Axial computed tomography images of the head/brain performed without intravenous contrast. Note: Per PQRS, the description of  internal carotid artery percent stenosis, including 0 percent or normal exam, is based on [employer] Symptomatic Carotid Endarterectomy Trial (NASCET) criteria. Dose reduction technique was used including one or more of the following: automated exposure control, adjustment of mA and kV according to patient size, and/or iterative reconstruction. CONTRAST: Without and with; 100 mL iohexol  (OMNIPAQUE ) 350 MG/ML injection. COMPARISON: CT of the head dated 12/16/2023. FINDINGS: CT HEAD: BRAIN: No acute intraparenchymal hemorrhage. No mass lesion. No CT evidence for acute territorial infarct. No midline shift or extra-axial collection. VENTRICLES: No hydrocephalus. ORBITS: The orbits are unremarkable. SINUSES AND MASTOIDS: The paranasal sinuses and mastoid air cells are clear. CTA NECK: COMMON CAROTID ARTERIES: No significant stenosis. No dissection or occlusion. INTERNAL CAROTID ARTERIES: No stenosis by NASCET criteria. No dissection or occlusion. 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 The V2 and V3 segments are otherwise normal in caliber. There is near occlusive stenosis of the V4 segment of the left vertebral artery distal to the takeoff of the posterior inferior cerebellar artery. The right vertebral artery demonstrates moderate stenosis at  its origin, but otherwise appears normal in caliber. No dissection. CTA HEAD: ANTERIOR CEREBRAL ARTERIES: No significant stenosis. No occlusion. No aneurysm. MIDDLE CEREBRAL ARTERIES: No significant stenosis. No occlusion. No aneurysm. POSTERIOR CEREBRAL ARTERIES: No significant stenosis. No occlusion. No aneurysm. BASILAR ARTERY: No significant stenosis. No occlusion. No aneurysm. 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 The above findings were communicated to Dr. Arora at 11:43 am 12/16/2023. SOFT TISSUES: No acute finding. No masses or lymphadenopathy. BONES: No acute osseous abnormality. IMPRESSION: 1. 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 2. 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 3. Complete Circle of Willis. 4. Critical results communicated to Dr. Arora at 11:43 am on 12/16/23. Electronically signed by: Evalene Coho MD 12/16/2023 11:47 AM EDT RP Workstation: [id number]   CT HEAD CODE STROKE WO CONTRAST Result Date: 12/16/2023 EXAM: CT HEAD WITHOUT 12/16/2023 11:03:27 AM TECHNIQUE: CT of the head was performed without the administration of intravenous contrast. Automated exposure control, iterative reconstruction, and/or weight based adjustment of the mA/kV was utilized to reduce the radiation dose to as low as reasonably achievable. COMPARISON: CT of the head dated 03/17/2023. CLINICAL HISTORY: Neuro deficit, acute, stroke suspected. Dizziness; LKW 0900; MD Voncile. FINDINGS: BRAIN AND VENTRICLES: No acute intracranial hemorrhage. No mass effect or midline shift. No extra-axial fluid collection. No evidence of acute infarct. No hydrocephalus. Age-related atrophy and mild cerebral white matter disease. Chronic lacunar infarcts are again noted within the right basal ganglia and right thalamus.  ORBITS: No acute abnormality. SINUSES AND MASTOIDS: No acute abnormality. SOFT TISSUES AND SKULL: No acute skull fracture. No acute soft tissue abnormality. [place] Stroke Program Early CT Score (ASPECTS) ----- Ganglionic (caudate, IC, lentiform nucleus, insula, M1-M3): 7 Supraganglionic (M4-M6): 3 Total: 10 The above findings were communicated to Dr. Voncile at 11:09 am 12/16/2023. IMPRESSION: 1. No acute intracranial abnormality related to the suspected stroke. 2. Age-related atrophy and mild cerebral white matter disease. 3. 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 4. ASPECTS: 10. Electronically signed by: Evalene Coho MD 12/16/2023 11:12 AM EDT RP Workstation: [id number]    Vitals:   12/17/23 0611 12/17/23 0630 12/17/23 0700 12/17/23 0800  BP: (!) 170/62 ROLLEN)  153/62 (!) 178/88 (!) 180/80  Pulse: 63 61 77 68  Resp: 17 20 19 14   Temp:      TempSrc:      SpO2: 100% 100% 98% 100%  Weight:      Height:         PHYSICAL EXAM General:  Alert, well-nourished, well-developed patient in no acute distress CV: Regular rate and rhythm on monitor Respiratory:  Regular, unlabored respirations on room air  NEURO:  Mental Status: AA&Ox3, with self-corrections. Speech/Language: speech is without dysarthria or aphasia.  Naming, repetition, fluency, and comprehension intact with intermittent paucity  Cranial Nerves:  II: PERRL. Visual fields full.  III, IV, VI: Left eye ptosis. No gaze palsy, tracking bilaterally. However, spontaneous nystagmus at mid position, direction to the right. With bilateral movement, more prominent nystagmus on the R gaze than upper gaze than L gaze, but unilateral nystagmus direction  V: Sensation is intact to light touch and symmetrical to face.  VII: Right facial droop  VIII: hearing intact to voice. IX, X: Palate elevates symmetrically. Phonation is normal.  KP:Dynloizm shrug 5/5. XII: tongue is midline without fasciculations. Motor: 5/5  strength to all muscle groups tested.  Tone: is normal and bulk is normal Sensation- Intact to light touch bilaterally. Extinction absent to light touch to DSS.   Coordination: FTN intact bilaterally, HKS: no ataxia in BLE.No drift.  Gait- deferred  Most Recent NIH 2   ASSESSMENT/PLAN  Evan Escobar is a 83 y.o. male with hx of seizure, cocaine use, alcohol use, glaucoma and hypertension who presents with sudden onset dizziness and florid horizontal nystagmus.  He reports some visual changes over the last few weeks and constipation but no other recent symptoms. NIH 2. Admitted for post-TNK monitoring and full stroke workup.    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 MRI  No acute intracranial abnormality EEG no seizure, normal limit 2D Echo: EF 60 to 65%  LDL 100 HgbA1c 5.1 UDS negative VTE prophylaxis - SCDs aspirin  81 mg daily prior to admission, resumed.   Start meclizine 25 mg 3 times daily for symptom relief Start Prednisone  50mg  daily x5 days for vestibular neuritis Added Percocet PRN for acute eye pain  Added Zofran  PRN for nausea Therapy recommendations:  CIR Disposition:   pending  Hypertension Home meds:  lisinopril  20mg  daily,  Stable on high and Resume lisinopril  20 Add amlodipine 10 Long-term BP goal normotensive  Lipid management Home meds:  none LDL 100, goal < 100 No statin needed as LDL at goal  Substance and Alcohol Abuse Patient has history of alcohol and cocaine use UDS and blood alcohol negative  Other Stroke Risk Factors  Family hx stroke (mother and father) Advanced Age Former smoker, quit in 2009  Other Active Problems Hyponatremia, sodium 130-135 Leukopenia WBC 2.8 Constipation Laxatives added History  of seizures PTA Keppra  250mg  BID, states he wasn't taking bc he has not had seizure in a while Routine EEG negative AEDs held for now   Hospital day # 1  Pt seen by Neuro NP/APP with MD. Note/plan to be edited by MD as needed.    Evan JAYSON Likes, DNP [facility] Please use AMION for contact information & EPIC for messaging.  ATTENDING NOTE: I reviewed above note and agree with the assessment and plan. Pt was seen and examined.   Wife is at the bedside. Pt is awake, alert, eyes open, orientated to age, place, time (with self corrections) and people. No aphasia, mild dysarthria, following all simple commands. Able to name and repeat. No gaze palsy, tracking bilaterally, visual field full, PERRL. However, spontaneous nystagmus at mid position, direction to the right. With bilateral movement, more prominent nystagmus on the R gaze than upper gaze than L gaze, but unilateral nystagmus direction. R mild facial droop. Tongue midline. Bilateral UEs 5/5, no drift. Bilaterally LEs 5/5, no drift. Sensation symmetrical bilaterally, R FTN and HTS intact, L FTN and HTS mild dysmetria. gait not tested   For detailed assessment and plan, please refer to above as I have made changes wherever appropriate.   [name], MD PhD Stroke Neurology 12/17/2023 7:30 PM  This patient is critically ill due to acute nystagmus, strokelike  symptoms status post TNK and at significant risk of neurological worsening, death form bleeding from TNK, fall. This patient's care requires constant monitoring of vital signs, hemodynamics, respiratory and cardiac monitoring, review of multiple databases, neurological assessment, discussion with family, other specialists and medical decision making of high complexity. I spent 50 minutes of neurocritical care time in the care of this patient. I had long discussion with patient and wife at bedside, updated pt current condition, treatment plan and potential prognosis, and answered all the questions.  They expressed understanding and appreciation.      To contact Stroke Continuity provider, please refer to [email or web address]. After hours, contact General Neurology

## 2023-12-17 NOTE — Progress Notes (Signed)
 Patient transferred to 3W via bed. Report given to 3W RN, all belongings with patient. Florence Herring, Pt's wife to inform her of the room change. All questions answered.

## 2023-12-17 NOTE — Plan of Care (Signed)
  Problem: Self-Care: Goal: Ability to communicate needs accurately will improve Outcome: Progressing   Problem: Nutrition: Goal: Risk of aspiration will decrease Outcome: Progressing Goal: Dietary intake will improve Outcome: Progressing   Problem: Education: Goal: Knowledge of General Education information will improve Description: Including pain rating scale, medication(s)/side effects and non-pharmacologic comfort measures Outcome: Progressing   Problem: Clinical Measurements: Goal: Ability to maintain clinical measurements within normal limits will improve Outcome: Progressing Goal: Respiratory complications will improve Outcome: Progressing   Problem: Nutrition: Goal: Adequate nutrition will be maintained Outcome: Progressing   Problem: Elimination: Goal: Will not experience complications related to urinary retention Outcome: Progressing   Problem: Pain Managment: Goal: General experience of comfort will improve and/or be controlled Outcome: Progressing   Problem: Safety: Goal: Ability to remain free from injury will improve Outcome: Progressing   Problem: Skin Integrity: Goal: Risk for impaired skin integrity will decrease Outcome: Progressing

## 2023-12-17 NOTE — Progress Notes (Signed)
 Inpatient Rehab Admissions Coordinator:   Per therapy recommendations, patient was screened for CIR candidacy by Leita Kleine, MS, CCC-SLP . At this time, it is not clear that Pt. Demonstrates medical necessity for CIR; however, work up is ongoing. I will not place consult at this time, but will rescreen when work up is complete. Please contact me with any questions.   Leita Kleine, MS, CCC-SLP Rehab Admissions Coordinator  [phone] (celll) [phone] (office)

## 2023-12-18 DIAGNOSIS — I639 Cerebral infarction, unspecified: Secondary | ICD-10-CM | POA: Diagnosis not present

## 2023-12-18 DIAGNOSIS — I10 Essential (primary) hypertension: Secondary | ICD-10-CM | POA: Diagnosis not present

## 2023-12-18 DIAGNOSIS — R29818 Other symptoms and signs involving the nervous system: Secondary | ICD-10-CM | POA: Diagnosis not present

## 2023-12-18 DIAGNOSIS — H8121 Vestibular neuronitis, right ear: Secondary | ICD-10-CM | POA: Diagnosis not present

## 2023-12-18 NOTE — Plan of Care (Signed)
  Problem: Education: Goal: Knowledge of disease or condition will improve Outcome: Progressing   Problem: Coping: Goal: Will verbalize positive feelings about self Outcome: Progressing   Problem: Nutrition: Goal: Risk of aspiration will decrease Outcome: Progressing   Problem: Activity: Goal: Risk for activity intolerance will decrease Outcome: Progressing   Problem: Safety: Goal: Ability to remain free from injury will improve Outcome: Progressing

## 2023-12-18 NOTE — Progress Notes (Signed)
   Inpatient Rehabilitation Admissions Coordinator   Noted likely vestibular neuritis. Recommend other rehab venues to be pursued at this time.  Heron Leavell, RN, MSN Rehab Admissions Coordinator [phone] 12/18/2023 1:55 PM

## 2023-12-18 NOTE — Progress Notes (Signed)
 Refused VS and stroke assessment

## 2023-12-18 NOTE — Progress Notes (Signed)
 Physical Therapy Treatment Patient Details Name: Evan Escobar MRN: [id number] DOB: Mar 23, 1940 Today's Date: 12/18/2023   History of Present Illness 83 yo male presents to [facility] on 10/29 for blurred vision, dizziness, n/v, balance impairment. 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 S/p TNK 1100 10/29. Suspect vestibular neuritis. PMH includes seizures, cocaine use, ETOH use, glaucoma, HTN, ACDF.    PT Comments  Pt received in supine and agreeable to session. Pt eager to ambulate this session and reports persistent dizziness that does not worsen with position changes. Pt able to tolerate gait trial with consistent R drift despite cues and CGA for safety. Pt requests to use the bathroom at the end of the session and pt and wife instructed to pull cord when finished, NT notified. Pt continues to benefit from PT services to progress toward functional mobility goals.     If plan is discharge home, recommend the following: A lot of help with walking and/or transfers;A lot of help with bathing/dressing/bathroom   Can travel by private vehicle        Equipment Recommendations  None recommended by PT    Recommendations for Other Services       Precautions / Restrictions Precautions Precautions: Fall Precaution/Restrictions Comments: resting horizontal nystagmus, L beat with L gaze and R beat with R gaze Restrictions Weight Bearing Restrictions Per Provider Order: No     Mobility  Bed Mobility Overal bed mobility: Needs Assistance Bed Mobility: Supine to Sit     Supine to sit: Supervision, HOB elevated          Transfers Overall  transfer level: Needs assistance Equipment used: Rolling walker (2 wheels) Transfers: Sit to/from Stand Sit to Stand: Min assist, Contact guard assist           General transfer comment: From EOB with min A and recliner with CGA. Cues for hand placement    Ambulation/Gait Ambulation/Gait assistance: Contact guard assist Gait Distance (Feet): 160 Feet (+10) Assistive device: Rolling walker (2 wheels) Gait Pattern/deviations: Step-through pattern, Decreased stride length, Trunk flexed, Drifts right/left       General Gait Details: Pt demonstrates short steps with low B foot clearance despite cues. Pt demonstrates R drift with decreased awarenss until he runs into the wall or obstacles despite frequent cues. Multimodal cues for upright posture and RW proximity.   Stairs             Wheelchair Mobility     Tilt Bed    Modified Rankin (Stroke Patients Only) Modified Rankin (Stroke Patients Only) Pre-Morbid Rankin Score: No significant disability Modified Rankin: Moderately severe disability     Balance Overall balance assessment: Needs assistance Sitting-balance support: No upper extremity supported, Feet supported Sitting balance-Leahy Scale: Fair Sitting balance - Comments: EOB   Standing balance support: Bilateral upper extremity supported, During functional activity Standing balance-Leahy Scale: Fair Standing balance comment: reliant on RW support for balance                            Communication Communication Communication: No apparent difficulties  Cognition Arousal: Alert Behavior During Therapy: [facility] for tasks assessed/performed  PT - Cognitive impairments: Awareness                       PT - Cognition Comments: Question R visual impairment vs decreased awareness. Pt perseverates on feeling cold throughout session Following commands: Impaired Following commands impaired: Follows one step commands with increased time     Cueing Cueing Techniques: Gestural cues, Verbal cues  Exercises      General Comments General comments (skin integrity, edema, etc.): Pt reports consistent dizziness throughout, but no overt LOB      Pertinent Vitals/Pain Pain Assessment Pain Assessment: No/denies pain     PT Goals (current goals can now be found in the care plan section) Acute Rehab PT Goals PT Goal Formulation: With patient Time For Goal Achievement: 12/31/23 Progress towards PT goals: Progressing toward goals    Frequency    Min 3X/week       AM-PAC PT 6 Clicks Mobility   Outcome Measure  Help needed turning from your back to your side while in a flat bed without using bedrails?: A Little Help needed moving from lying on your back to sitting on the side of a flat bed without using bedrails?: A Little Help needed moving to and from a bed to a chair (including a wheelchair)?: A Little Help needed standing up from a chair using your arms (e.g., wheelchair or bedside chair)?: A Little Help needed to walk in hospital room?: A Little Help needed climbing 3-5 steps with a railing? : A Lot 6 Click Score: 17    End of Session Equipment Utilized During Treatment: Gait belt Activity Tolerance: Patient tolerated treatment well Patient left: with call bell/phone within reach;with family/visitor present (in bathroom) Nurse Communication: Mobility status;Other (comment) (Pt in bathroom and instructed to pull cord when finished. Wife present) PT Visit Diagnosis: Unsteadiness on feet (R26.81);Difficulty in walking, not elsewhere classified (R26.2)     Time: 9158-9089 PT Time Calculation (min) (ACUTE ONLY): 29 min  Charges:    $Gait Training: 8-22 mins $Therapeutic Activity: 8-22 mins PT General Charges $$ ACUTE PT VISIT: 1 Visit                     Darryle George, PTA Acute Rehabilitation Services Secure Chat Preferred  [phone]    Darryle George 12/18/2023, 9:25 AM

## 2023-12-18 NOTE — Progress Notes (Addendum)
 STROKE TEAM PROGRESS NOTE    SIGNIFICANT HOSPITAL EVENTS 10/29: Presented with sudden onset dizziness and horizontal nystagmus             TNK given @ 1116   INTERIM HISTORY/SUBJECTIVE  Family at the bedside. Patient sitting in the chair in NAD. He states he does feel much better today  No new neurological events overnight   CBC    Component Value Date/Time   WBC 2.8 (L) 12/16/2023 1059   RBC 3.31 (L) 12/16/2023 1059   HGB 10.2 (L) 12/16/2023 1120   HCT 30.0 (L) 12/16/2023 1120   PLT 142 (L) 12/16/2023 1059   MCV 87.9 12/16/2023 1059   MCH 29.0 12/16/2023 1059   MCHC 33.0 12/16/2023 1059   RDW 15.2 12/16/2023 1059   LYMPHSABS 1.6 12/16/2023 1059   MONOABS 0.3 12/16/2023 1059   EOSABS 0.0 12/16/2023 1059   BASOSABS 0.0 12/16/2023 1059    BMET    Component Value Date/Time   NA 135 12/16/2023 1120   K 3.9 12/16/2023 1120   CL 98 12/16/2023 1120   CO2 24 12/16/2023 1059   GLUCOSE 103 (H) 12/16/2023 1120   BUN 15 12/16/2023 1120   CREATININE 1.10 12/16/2023 1120   CREATININE 1.28 (H) 10/13/2019 1532   CALCIUM 8.6 (L) 12/16/2023 1059   GFRNONAA >60 12/16/2023 1059   GFRNONAA 53 (L) 10/13/2019 1532    IMAGING past 24 hours No results found.  Vitals:   12/17/23 2002 12/18/23 0441 12/18/23 0806 12/18/23 1149  BP: 120/78 139/69 (!) 177/82 (!) 141/94  Pulse: 84 70 80 88  Resp: 16  16 15   Temp:  97.6 F (36.4 C) 98.3 F (36.8 C) 98.9 F (37.2 C)  TempSrc:  Oral Oral Oral  SpO2: 100% 100% 100% 100%  Weight:      Height:         PHYSICAL EXAM General:  Alert, well-nourished, well-developed patient in no acute distress CV: Regular rate and rhythm on monitor Respiratory:  Regular, unlabored respirations on room air   NEURO:  Mental Status: AA&Ox3, with self-corrections. Speech/Language: speech is without dysarthria or aphasia.  Naming, repetition, fluency, and comprehension intact with intermittent paucity   Cranial Nerves:  II: PERRL. Visual fields full.   III, IV, VI: Left eye ptosis. No gaze palsy, tracking bilaterally. However, spontaneous nystagmus at mid position, direction to the right. With bilateral movement, more prominent nystagmus on the R gaze than upper gaze than L gaze, but unilateral nystagmus direction  V: Sensation is intact to light touch and symmetrical to face.  VII: Right facial droop  VIII: hearing intact to voice. IX, X: Palate elevates symmetrically. Phonation is normal.  KP:Dynloizm shrug 5/5. XII: tongue is midline without fasciculations. Motor: 5/5 strength to all muscle groups tested.  Tone: is normal and bulk is normal Sensation- Intact to light touch bilaterally. Extinction absent to light touch to DSS.   Coordination: FTN intact bilaterally, HKS: no ataxia in BLE.No drift.  Gait- deferred   Most Recent NIH 2 ASSESSMENT/PLAN   Mr. Evan Escobar is a 83 y.o. male with hx of seizure, cocaine use, alcohol use, glaucoma and hypertension who presents with sudden onset dizziness and florid horizontal nystagmus.  He reports some visual changes over the last few weeks and constipation but no other recent symptoms. NIH 2. Admitted for post-TNK monitoring and full stroke workup.     Stroke-like symptoms s/p TNK Likely vestibular Neuritis, less likely MRI negative postrior circulation stroke  Prominent spontaneous  nystagmus and rest position, unilateral nystagmus direction, recent diplopia and blurry vision, supporting peripheral etiology Acute onset worsening nystagmus and dizziness and gait difficulty with multiple stroke risk factors supporting central etiology Code Stroke CT head No acute intracranial abnormality related to the suspected stroke. 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 MRI  No acute intracranial abnormality EEG no seizure, normal limit 2D Echo: EF 60 to 65%  LDL 100 HgbA1c 5.1 UDS negative VTE prophylaxis - SCDs aspirin  81 mg daily prior to admission, resumed.   Start meclizine 25 mg 3 times daily for symptom relief Start Prednisone  50mg  daily x5 days for vestibular neuritis Added Percocet PRN for acute eye pain  Added Zofran  PRN for nausea Therapy recommendations:  CIR Disposition:  pending   Hypertension Home meds:  lisinopril  20mg  daily  Stable on high and Resume lisinopril  20 Add amlodipine 10 Long-term BP goal normotensive   Lipid management Home meds:  none LDL 100, goal < 100 No statin needed as LDL at goal   Substance and Alcohol Abuse Patient has history of alcohol and cocaine use UDS and blood alcohol negative   Other Stroke Risk Factors  Family hx stroke (mother and father) Advanced Age Former smoker, quit in 2009   Other Active Problems Hyponatremia, sodium 130-135 Leukopenia WBC 2.8 Constipation Laxatives added History of seizures PTA Keppra  250mg  BID, states he wasn't taking bc he has not had seizure in a while Routine EEG negative AEDs held for now  Hospital day # 2   Evan Geralds DNP, ACNPC-AG  [facility]  ATTENDING NOTE: I reviewed above note and agree with the assessment and plan. Pt was seen and examined.   Wife at bedside.  Patient sitting in chair, stated that he felt that his vision and eye jumping improved, dizziness also improved.  No significant headache.  He was started on steroids and meclizine yesterday.  Continue at this time.  PT and OT recommend CIR.  For detailed assessment and plan, please refer to above as I have made changes wherever appropriate.   [name], MD PhD Stroke Neurology 12/18/2023 6:54 PM      To contact Stroke Continuity provider, please refer to  [email or web address]. After hours, contact General Neurology

## 2023-12-18 NOTE — Evaluation (Signed)
 Occupational Therapy Evaluation Patient Details Name: Evan Escobar MRN: [id number] DOB: 14-Aug-1940 Today's Date: 12/18/2023   History of Present Illness   83 yo male presents to [facility] on 10/29 for blurred vision, dizziness, n/v, balance impairment. 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 S/p TNK 1100 10/29. Suspect vestibular neuritis. PMH includes seizures, cocaine use, ETOH use, glaucoma, HTN, ACDF.     Clinical Impressions Pt greeted in bed, agreeable for OT visit. AOX4. PTA, pt was living with his wife and was fully indep with ADLs, IADLs, and mobility. + Driving. He presents today with intact strength in BUE and some coordination deficits (anticipated 2/2 visual deficits), and resting horizontal nystagmus (fatigues in L eye with L gaze). Displays mild cog impairments related to comprehension, recall, and attention. Functionally, he needed CGA-min A for functional transfers + mobility with RW. Difficulty navigating environmental obstacles in R environment (suspected 2/2 R superior visual field deficits). Min A for LB ADLs and CGA for standing UB ADLs.  Pt is currently functioning below baseline and would benefit from ongoing acute OT services to progress towards safe discharge and to facilitate return to prior level of function. Current recommendation is high-intensity post-acute rehab (> 3 hours/day).     If plan is discharge home, recommend the following:   A little help with walking and/or transfers;A little help with bathing/dressing/bathroom;Assistance with cooking/housework;Direct supervision/assist for medications management;Direct  supervision/assist for financial management;Assist for transportation;Supervision due to cognitive status;Help with stairs or ramp for entrance     Functional Status Assessment   Patient has had a recent decline in their functional status and demonstrates the ability to make significant improvements in function in a reasonable and predictable amount of time.     Equipment Recommendations   Other (comment) (defer to next level of care)     Recommendations for Other Services   Rehab consult     Precautions/Restrictions   Precautions Precautions: Fall Precaution/Restrictions Comments: resting horizontal nystagmus (fatigues with L gaze) Restrictions Weight Bearing Restrictions Per Provider Order: No     Mobility Bed Mobility Overal bed mobility: Needs Assistance Bed Mobility: Supine to Sit, Sit to Supine     Supine to sit: Supervision, HOB elevated, Used rails Sit to supine: Contact guard assist, HOB elevated, Used rails   General bed mobility comments: reliant on bed rails, inc time/effort to complete    Transfers Overall transfer level: Needs assistance Equipment used: Rolling walker (2 wheels) Transfers: Sit to/from Stand Sit to Stand: Contact guard assist, Min assist           General transfer comment: Stood from bed with cues for hand placement relative to RW, inc time to come to upright.      Balance Overall balance assessment: Needs assistance Sitting-balance support: No upper extremity supported, Feet supported Sitting balance-Leahy Scale: Fair Sitting balance - Comments: seated EOB, intermittent CGA for safety   Standing balance support: Bilateral upper extremity supported, During functional activity, Reliant on assistive device for balance Standing balance-Leahy Scale: Poor Standing balance comment: reliant on RW, up to min A to navigate around environmental obstacles (especially located in R environment)  ADL either performed or assessed with clinical judgement   ADL Overall ADL's : Needs assistance/impaired Eating/Feeding: Set up;Sitting   Grooming: Minimal assistance;Standing;Wash/dry hands Grooming Details (indicate cue type and reason): cues for locating grooming items and for stabiltiy at the sink             Lower Body Dressing: Contact guard assist;Sitting/lateral leans Lower Body Dressing Details (indicate cue type and reason): adjusting B socks, anticipate inc A needed for standing LB dressing             Functional mobility during ADLs: Contact guard assist;Rolling walker (2 wheels)       Vision Baseline Vision/History: 1 Wears glasses;3 Glaucoma Ability to See in Adequate Light: 1 Impaired Patient Visual Report: Diplopia;Blurring of vision;Eye fatigue/eye pain/headache;Peripheral vision impairment (? diplopia, pt poor historian when OT asking questions) Vision Assessment?: Yes Eye Alignment: Impaired (comment) (dysconjugate gaze at rest, OS mildly exotropic) Ocular Range of Motion: Restricted on the right;Restricted on the left;Restricted looking up;Restricted looking down Alignment/Gaze Preference: Within Defined Limits Tracking/Visual Pursuits: Decreased smoothness of horizontal tracking;Decreased smoothness of eye movement to RIGHT superior field;Decreased smoothness of eye movement to RIGHT inferior field;Requires cues, head turns, or add eye shifts to track Saccades: Decreased speed of saccadic movement;Additional eye shifts occurred during testing;Additional head turns occurred during testing Convergence: Impaired - to be further tested in functional context Visual Fields: Right visual field deficit Diplopia Assessment: Only with right gaze (questionable, difficult to discern per pt description) Depth Perception: Undershoots Additional Comments: horizontal nystagmus at rest: R beating with R gaze and L beating with L gaze (nystagmust fatigues in L eye with L  gaze)     Perception Perception: Impaired   Perception-Other Comments: ? R inattention, suspect 2/2 visual deficits   Praxis         Pertinent Vitals/Pain Pain Assessment Pain Assessment: No/denies pain     Extremity/Trunk Assessment Upper Extremity Assessment Upper Extremity Assessment: Overall WFL for tasks assessed       Cervical / Trunk Assessment Cervical / Trunk Assessment: Normal   Communication Communication Communication: Impaired Factors Affecting Communication: Difficulty expressing self (intermittent word finding difficulties)   Cognition Arousal: Alert Behavior During Therapy: WFL for tasks assessed/performed Cognition: Cognition impaired     Awareness: Online awareness impaired Memory impairment (select all impairments): Short-term memory, Working memory Attention impairment (select first level of impairment): Selective attention Executive functioning impairment (select all impairments): Reasoning, Problem solving OT - Cognition Comments: pt providing vague answers to questions re: vision/dizziness, reduced reasoning and STM recall                 Following commands: Impaired Following commands impaired: Follows one step commands with increased time     Cueing  General Comments   Cueing Techniques: Gestural cues;Verbal cues  pt endorsing dizziness has improved since hospitalization, no family present   Exercises     Shoulder Instructions      Home Living Family/patient expects to be discharged to:: Private residence Living Arrangements: Spouse/significant other Available Help at Discharge: Family;Available 24 hours/day Type of Home: House Home Access: Stairs to enter [employer] of Steps: 2 Entrance Stairs-Rails: None Home Layout: One level     Bathroom Shower/Tub: Chief Strategy Officer: Standard Bathroom Accessibility: No   Home Equipment: Cane - single Librarian, Academic (2 wheels)   Additional  Comments: retired curator; + driving PTA  Lives With: Spouse    Prior Functioning/Environment Prior Level of Function : Independent/Modified Independent;Driving  Mobility Comments: cane most of the time, reports inc reliance on SPC since dizziness started ADLs Comments: indep    OT Problem List: Decreased strength;Impaired balance (sitting and/or standing);Impaired vision/perception;Decreased cognition   OT Treatment/Interventions: Self-care/ADL training;Therapeutic exercise;DME and/or AE instruction;Therapeutic activities;Cognitive remediation/compensation;Visual/perceptual remediation/compensation;Patient/family education;Balance training      OT Goals(Current goals can be found in the care plan section)   Acute Rehab OT Goals Patient Stated Goal: get better OT Goal Formulation: With patient Time For Goal Achievement: 01/01/24 Potential to Achieve Goals: Good   OT Frequency:  Min 2X/week    Co-evaluation              AM-PAC OT 6 Clicks Daily Activity     Outcome Measure Help from another person eating meals?: None Help from another person taking care of personal grooming?: A Little Help from another person toileting, which includes using toliet, bedpan, or urinal?: A Little Help from another person bathing (including washing, rinsing, drying)?: A Lot Help from another person to put on and taking off regular upper body clothing?: A Little Help from another person to put on and taking off regular lower body clothing?: A Little 6 Click Score: 18   End of Session Equipment Utilized During Treatment: Gait belt;Rolling walker (2 wheels) Nurse Communication: Mobility status  Activity Tolerance: Patient tolerated treatment well Patient left: in bed;with call bell/phone within reach;with bed alarm set  OT Visit Diagnosis: Unsteadiness on feet (R26.81);Dizziness and giddiness (R42);Low vision, both eyes (H54.2)                Time: 8587-8557 OT Time  Calculation (min): 30 min Charges:  OT General Charges $OT Visit: 1 Visit OT Evaluation $OT Eval Moderate Complexity: 1 Mod OT Treatments $Therapeutic Activity: 8-22 mins  Gerome Kokesh D., MSOT, OTR/L Acute Rehabilitation Services [phone] Secure Chat Preferred  Rikki Milch 12/18/2023, 4:54 PM

## 2023-12-19 DIAGNOSIS — R29702 NIHSS score 2: Secondary | ICD-10-CM

## 2023-12-19 DIAGNOSIS — I6389 Other cerebral infarction: Secondary | ICD-10-CM

## 2023-12-19 DIAGNOSIS — E785 Hyperlipidemia, unspecified: Secondary | ICD-10-CM | POA: Insufficient documentation

## 2023-12-19 NOTE — Progress Notes (Addendum)
 STROKE TEAM PROGRESS NOTE    SIGNIFICANT HOSPITAL EVENTS 10/29: Presented with sudden onset dizziness and horizontal nystagmus             TNK given @ 1116   INTERIM HISTORY/SUBJECTIVE Patient apparently has a decreased vision bilaterally at baseline.  He had definite worsening of vision yesterday along with nystagmus and eye-movement abnormalities.  He denies true vertigo, tinnitus or decreased hearing Wife is at the bedside.  Patient is laying in bed in no apparent distress.  Neurological exam remains stable and unchanged.  No new neurological events overnight  CBC    Component Value Date/Time   WBC 2.8 (L) 12/16/2023 1059   RBC 3.31 (L) 12/16/2023 1059   HGB 10.2 (L) 12/16/2023 1120   HCT 30.0 (L) 12/16/2023 1120   PLT 142 (L) 12/16/2023 1059   MCV 87.9 12/16/2023 1059   MCH 29.0 12/16/2023 1059   MCHC 33.0 12/16/2023 1059   RDW 15.2 12/16/2023 1059   LYMPHSABS 1.6 12/16/2023 1059   MONOABS 0.3 12/16/2023 1059   EOSABS 0.0 12/16/2023 1059   BASOSABS 0.0 12/16/2023 1059    BMET    Component Value Date/Time   NA 135 12/16/2023 1120   K 3.9 12/16/2023 1120   CL 98 12/16/2023 1120   CO2 24 12/16/2023 1059   GLUCOSE 103 (H) 12/16/2023 1120   BUN 15 12/16/2023 1120   CREATININE 1.10 12/16/2023 1120   CREATININE 1.28 (H) 10/13/2019 1532   CALCIUM 8.6 (L) 12/16/2023 1059   GFRNONAA >60 12/16/2023 1059   GFRNONAA 53 (L) 10/13/2019 1532    IMAGING past 24 hours No results found.  Vitals:   12/18/23 2100 12/19/23 0507 12/19/23 0738 12/19/23 1149  BP: 108/64 135/64 (!) 141/68 120/65  Pulse: 83 73 70 86  Resp: 18 19 16 16   Temp: 97.6 F (36.4 C) 98.7 F (37.1 C) 98.4 F (36.9 C) 98 F (36.7 C)  TempSrc: Oral Oral Oral Oral  SpO2: 100% 99% 100% 100%  Weight:      Height:         PHYSICAL EXAM General:  Alert, well-nourished, well-developed patient in no acute distress CV: Regular rate and rhythm on monitor Respiratory:  Regular, unlabored respirations on room  air   NEURO:  Mental Status: AA&Ox3, with self-corrections. Speech/Language: speech is without dysarthria or aphasia.  Naming, repetition, fluency, and comprehension intact with intermittent paucity   Cranial Nerves:  II: PERRL. Visual fields full.  III, IV, VI: Left eye ptosis. No gaze palsy, tracking bilaterally. However, spontaneous nystagmus at mid position, direction to the right. With bilateral movement, more prominent nystagmus on the R gaze than upper gaze than L gaze, but unilateral nystagmus direction  V: Sensation is intact to light touch and symmetrical to face.  VII: Right facial droop  VIII: hearing intact to voice. IX, X: Palate elevates symmetrically. Phonation is normal.  KP:Dynloizm shrug 5/5. XII: tongue is midline without fasciculations. Motor: 5/5 strength to all muscle groups tested.  Tone: is normal and bulk is normal Sensation- Intact to light touch bilaterally. Extinction absent to light touch to DSS.   Coordination: FTN intact bilaterally, HKS: no ataxia in BLE.No drift.  Gait- deferred   Most Recent NIH 2 ASSESSMENT/PLAN   Evan Escobar is a 83 y.o. male with hx of seizure, cocaine use, alcohol use, glaucoma and hypertension who presents with sudden onset dizziness and florid horizontal nystagmus.  He reports some visual changes over the last few weeks and constipation but  no other recent symptoms. NIH 2. Admitted for post-TNK monitoring and full stroke workup.     Stroke-like symptoms s/p TNK Likely MRI negative postrior circulation stroke, less likely vestibular Neuritis Prominent spontaneous nystagmus and rest position, unilateral nystagmus direction, recent diplopia and blurry vision, supporting peripheral etiology Acute onset worsening nystagmus and dizziness and gait difficulty with multiple stroke risk factors supporting central etiology Code Stroke CT head No acute intracranial abnormality related to the suspected stroke. 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 MRI  No acute intracranial abnormality EEG no seizure, normal limit 2D Echo: EF 60 to 65%  LDL 100 HgbA1c 5.1 UDS negative VTE prophylaxis - SCDs aspirin  81 mg daily prior to admission, resumed.    Added Percocet PRN for acute eye pain  Added Zofran  PRN for nausea Therapy recommendations: Home  disposition: Home Hypertension Home meds:  lisinopril  20mg  daily  Stable on high and Resume lisinopril  20 Add amlodipine 10 Long-term BP goal normotensive   Lipid management Home meds:  none LDL 100, goal < 100 No statin needed as LDL at goal   Substance and Alcohol Abuse Patient has history of alcohol and cocaine use UDS and blood alcohol negative   Other Stroke Risk Factors  Family hx stroke (mother and father) Advanced Age Former smoker, quit in 2009   Other Active Problems Hyponatremia, sodium 130-135 Leukopenia WBC 2.8 Constipation Laxatives added History of seizures PTA Keppra  250mg  BID, states he wasn't taking bc he has not had seizure in a while Routine EEG negative AEDs held for now  Hospital day # 3   [name], ACNPC-AG  [facility]  I have personally obtained history,examined this patient, reviewed notes, independently viewed imaging studies, participated in medical decision making and plan of care.ROS completed by me personally and pertinent positives fully documented  I have made any additions or clarifications directly to the above note. Agree with note above.  Patient presented with sudden onset of worsening of baseline vision difficulties with nystagmus and dizziness likely due to small posterior  circulation infarct aborted by IV TNK.  Doubt vestibular neuronitis due to absence of tinnitus, hearing loss and vertigo.  Will ask therapy to reevaluate patient and he can hopefully go home in the next day or 2 instead of going to rehab if he continues to get better.  Long discussion with patient and wife at the bedside and answered questions.   I personally spent a total of 35 minutes in the care of the patient today including getting/reviewing separately obtained history, performing a medically appropriate exam/evaluation, counseling and educating, placing orders, referring and communicating with other health care professionals, documenting clinical information in the EHR, independently interpreting results, and coordinating care.         Eather Popp, MD Medical Director [facility] Pager: [phone] 12/19/2023 4:28 PM    To contact Stroke Continuity provider, please refer to [email or web address]. After hours, contact General Neurology

## 2023-12-19 NOTE — Plan of Care (Signed)
  Problem: Skin Integrity: Goal: Risk for impaired skin integrity will decrease Outcome: Progressing   Problem: Safety: Goal: Ability to remain free from injury will improve Outcome: Progressing   Problem: Elimination: Goal: Will not experience complications related to urinary retention Outcome: Progressing   Problem: Elimination: Goal: Will not experience complications related to bowel motility Outcome: Progressing   Problem: Nutrition: Goal: Adequate nutrition will be maintained Outcome: Progressing   Problem: Activity: Goal: Risk for activity intolerance will decrease Outcome: Progressing   Problem: Education: Goal: Knowledge of secondary prevention will improve (MUST DOCUMENT ALL) Outcome: Progressing   Problem: Education: Goal: Knowledge of patient specific risk factors will improve (DELETE if not current risk factor) Outcome: Progressing   Problem: Coping: Goal: Will verbalize positive feelings about self Outcome: Progressing   Problem: Coping: Goal: Will identify appropriate support needs Outcome: Progressing   Problem: Education: Goal: Knowledge of disease or condition will improve Outcome: Progressing   Problem: Self-Care: Goal: Ability to participate in self-care as condition permits will improve Outcome: Progressing   Problem: Health Behavior/Discharge Planning: Goal: Goals will be collaboratively established with patient/family Outcome: Progressing   Problem: Health Behavior/Discharge Planning: Goal: Ability to manage health-related needs will improve Outcome: Progressing

## 2023-12-19 NOTE — Progress Notes (Addendum)
 Physical Therapy Treatment Patient Details Name: Evan Escobar MRN: [id number] DOB: 12/06/1940 Today's Date: 12/19/2023   History of Present Illness 83 yo male presents to [facility] on 10/29 for blurred vision, dizziness, n/v, balance impairment. Negative for acute abnormalities with chronic lacunar infarcts in R basal ganglia and R thalamus. Likely vestibular Neuritis, less likely MRI negative postrior circulation stroke. S/p TNK 1100 10/29. PMH includes seizures, cocaine use, ETOH use, glaucoma, HTN, ACDF    PT Comments  Pt in recliner upon arrival and agreeable to PT session. Focused session on safety to return home as pt was declined from CIR. Pt was able to increase gait distance to 556ft with RW and CGA/MinA. Pt had increased difficulty navigating around obstacles in a narrow environment with MinA for slight steadying assist. Pt continued to drift to the R with frequent cueing for visual scanning to avoid obstacles on the right side. Worked on psychologist, counselling with ModA needed via 1HH. Improved stability when utilizing RW and backwards approach with MinA to stabilize RW. Pt was not dizzy at rest with complaints of dizziness after ambulating ~261ft. Upon returning to the room, discussed safety at home with pt's wife on the phone. Discussed using a gait belt (given at end of session) with recommendation to always have family close by when pt is mobilizing. Discussed different techniques to enter/exit the home and how to safely guard with handout given to pt. Pt and pt's wife feel comfortable discharging home whenever medically stable. Recommending vestibular OP PT with acute PT to follow.     If plan is discharge home, recommend the following: A lot of help with walking and/or transfers;A lot of help with bathing/dressing/bathroom   Can travel by private vehicle      Yes  Equipment Recommendations  None recommended by PT       Precautions / Restrictions Precautions Precautions: Fall Recall of  Precautions/Restrictions: Impaired Precaution/Restrictions Comments: resting horizontal nystagmus (fatigues with L gaze) Restrictions Weight Bearing Restrictions Per Provider Order: No     Mobility  Bed Mobility Overal bed mobility: Needs Assistance Bed Mobility: Sit to Supine   Sit to supine: Supervision   General bed mobility comments: supervision for safety, no physical assist needed    Transfers Overall transfer level: Needs assistance Equipment used: Rolling walker (2 wheels) Transfers: Sit to/from Stand Sit to Stand: Contact guard assist    General transfer comment: cues for hand placement with CGA for safety    Ambulation/Gait Ambulation/Gait assistance: Contact guard assist, Min assist Gait Distance (Feet): 500 Feet Assistive device: Rolling walker (2 wheels) Gait Pattern/deviations: Step-through pattern, Decreased stride length, Trunk flexed, Drifts right/left, Narrow base of support Gait velocity: decr    General Gait Details: R drift with decreased awareness of obstacles on R side. MinA for slight instability when negotiating around obstacles in narrow space. Cues for upright posture and to widen step width   Stairs Stairs: Yes Stairs assistance: Min assist, Mod assist Stair Management: No rails, Step to pattern, Forwards, With walker Number of Stairs: 6 General stair comments: x2 steps with 1HH and ModA for steadying assist. x4 steps with use of RW and backwards approach, MinA to stabilize RW.     Modified Rankin (Stroke Patients Only) Modified Rankin (Stroke Patients Only) Pre-Morbid Rankin Score: No significant disability Modified Rankin: Moderately severe disability     Balance Overall balance assessment: Needs assistance Sitting-balance support: No upper extremity supported, Feet supported Sitting balance-Leahy Scale: Fair Sitting balance - Comments: seated EOB, intermittent CGA  for safety   Standing balance support: Bilateral upper extremity  supported, During functional activity, Reliant on assistive device for balance Standing balance-Leahy Scale: Poor Standing balance comment: reliant on RW, up to min A to navigate around environmental obstacles (especially located in R environment)       Communication Communication Communication: Impaired Factors Affecting Communication: Difficulty expressing self (intermittent word finding difficulties)  Cognition Arousal: Alert Behavior During Therapy: WFL for tasks assessed/performed   PT - Cognitive impairments: Awareness    PT - Cognition Comments: decreased awareness of R drift during gait Following commands: Impaired Following commands impaired: Follows one step commands with increased time    Cueing Cueing Techniques: Gestural cues, Verbal cues         Pertinent Vitals/Pain Pain Assessment Pain Assessment: No/denies pain     PT Goals (current goals can now be found in the care plan section) Acute Rehab PT Goals PT Goal Formulation: With patient Time For Goal Achievement: 12/31/23 Potential to Achieve Goals: Good Progress towards PT goals: Progressing toward goals    Frequency    Min 3X/week       AM-PAC PT 6 Clicks Mobility   Outcome Measure  Help needed turning from your back to your side while in a flat bed without using bedrails?: A Little Help needed moving from lying on your back to sitting on the side of a flat bed without using bedrails?: A Little Help needed moving to and from a bed to a chair (including a wheelchair)?: A Little Help needed standing up from a chair using your arms (e.g., wheelchair or bedside chair)?: A Little Help needed to walk in hospital room?: A Little Help needed climbing 3-5 steps with a railing? : A Lot 6 Click Score: 17    End of Session Equipment Utilized During Treatment: Gait belt Activity Tolerance: Patient tolerated treatment well Patient left: in bed;with call bell/phone within reach;with bed alarm set Nurse  Communication: Mobility status;Other (comment) (D/C needs) PT Visit Diagnosis: Unsteadiness on feet (R26.81);Difficulty in walking, not elsewhere classified (R26.2)     Time: 8578-8545 PT Time Calculation (min) (ACUTE ONLY): 33 min  Charges:    $Gait Training: 8-22 mins $Therapeutic Activity: 8-22 mins PT General Charges $$ ACUTE PT VISIT: 1 Visit                    Kate ORN, PT, DPT Secure Chat Preferred  Rehab Office [phone]   Kate BRAVO Wendolyn 12/19/2023, 3:09 PM

## 2023-12-20 MED ORDER — AMLODIPINE BESYLATE 10 MG PO TABS: 10.0000 mg | ORAL_TABLET | Freq: Every day | ORAL | 1 refills | Status: DC

## 2023-12-20 MED ORDER — MECLIZINE HCL 25 MG PO TABS: 25.0000 mg | ORAL_TABLET | Freq: Three times a day (TID) | ORAL | 0 refills | Status: DC

## 2023-12-20 MED ORDER — PREDNISONE 50 MG PO TABS: 50.0000 mg | ORAL_TABLET | Freq: Every day | ORAL | 0 refills | Status: DC

## 2023-12-20 MED ORDER — ASPIRIN 81 MG PO TBEC: 81.0000 mg | DELAYED_RELEASE_TABLET | Freq: Every day | ORAL | 12 refills | Status: AC

## 2023-12-20 NOTE — Discharge Summary (Addendum)
 Stroke Discharge Summary  Patient ID: Evan Escobar   MRN: [id number]      DOB: 1940/11/13  Date of Admission: 12/16/2023 Date of Discharge: 12/20/2023  Attending Physician:  No att. providers found Consultant(s):    None  Patient's PCP:  Rollene Almarie LABOR, MD  DISCHARGE PRIMARY DIAGNOSIS:   Stroke-like symptoms s/p TNK Likely MRI negative posterior circulation smalls troke not visualized on MRI, less likely vestibular Neuritis  Patient Active Problem List   Diagnosis Date Noted   Hyperlipidemia 12/19/2023   Stroke-like symptoms 12/17/2023   COVID-19 10/05/2023   Weakness 10/05/2023   Other fatigue 10/05/2023   Hyponatremia 10/05/2023   COVID-19 virus infection 09/28/2023   Lower respiratory infection 09/28/2023   Flu-like symptoms 09/28/2023   Chest congestion 09/28/2023   Dog bite 09/08/2023   Need for tetanus booster 09/08/2023   Bilateral impacted cerumen 08/27/2023   Hx of completed stroke 03/27/2023   Urinary incontinence 01/18/2023   Prostate abscess 08/04/2022   Anemia of chronic disease 08/04/2022   History of recurrent UTIs 05/12/2022   Toenail fungus 07/30/2021   Deficiency anemia 10/13/2019   Persistent cough 10/13/2019   Chronic idiopathic constipation 10/13/2019   Cervical radiculopathy 01/10/2019   Left lumbar radiculopathy 12/09/2017   Finger pain, right 10/06/2017   Hearing loss 09/09/2016   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   amLODipine 10 MG tablet Commonly known as: NORVASC Take 1 tablet (10 mg total) by mouth daily.   aspirin  EC 81 MG tablet Take 1 tablet (81 mg total) by mouth daily. Swallow whole. Replaces: aspirin  81 MG tablet   brimonidine 0.1 % Soln Commonly known as: ALPHAGAN P Place 1 drop into both eyes 2 (two) times daily.   cycloSPORINE  0.05 % ophthalmic emulsion Commonly known as: RESTASIS  Place 1 drop into both eyes 2 (two) times daily.   latanoprost  0.005 % ophthalmic solution Commonly known as: XALATAN  Place 1 drop into both eyes at bedtime.   levETIRAcetam  250 MG tablet Commonly known as: KEPPRA  Take 1 tablet (250 mg total) by mouth 2 (two) times daily. APPOINTMENT NEEDED FOR FURTHER REFILLS   linaclotide  145 MCG Caps capsule Commonly known as: Linzess  Take 1 capsule (145 mcg total) by mouth daily before breakfast.   lisinopril  20 MG tablet Commonly known as: ZESTRIL  TAKE 1 TABLET(20 MG) BY MOUTH DAILY   magnesium  hydroxide 400 MG/5ML suspension Commonly known as: MILK OF MAGNESIA Take 30 mLs by mouth daily as needed for mild constipation.   meclizine 25 MG tablet Commonly known as: ANTIVERT Take 1 tablet (25 mg total) by mouth every 8 (eight) hours.   multivitamin tablet Take 1 tablet by mouth daily.   polyethylene glycol 17 g packet Commonly known as: MiraLax  Take 17 g by mouth daily.   predniSONE  50 MG tablet Commonly known as: DELTASONE  Take  1 tablet (50 mg total) by mouth daily with breakfast.   tamsulosin  0.4 MG Caps capsule Commonly known as: FLOMAX  TAKE 1 CAPSULE(0.4 MG) BY MOUTH IN THE MORNING AND AT BEDTIME. Please schedule appointment with PCP        LABORATORY STUDIES CBC    Component Value Date/Time   WBC 2.8 (L) 12/16/2023 1059   RBC 3.31 (L) 12/16/2023 1059   HGB 10.2 (L) 12/16/2023 1120   HCT 30.0 (L) 12/16/2023 1120   PLT 142 (L) 12/16/2023 1059   MCV 87.9 12/16/2023 1059    MCH 29.0 12/16/2023 1059   MCHC 33.0 12/16/2023 1059   RDW 15.2 12/16/2023 1059   LYMPHSABS 1.6 12/16/2023 1059   MONOABS 0.3 12/16/2023 1059   EOSABS 0.0 12/16/2023 1059   BASOSABS 0.0 12/16/2023 1059   CMP    Component Value Date/Time   NA 135 12/16/2023 1120   K 3.9 12/16/2023 1120   CL 98 12/16/2023 1120   CO2 24 12/16/2023 1059   GLUCOSE 103 (H) 12/16/2023 1120   BUN 15 12/16/2023 1120   CREATININE 1.10 12/16/2023 1120   CREATININE 1.28 (H) 10/13/2019 1532   CALCIUM 8.6 (L) 12/16/2023 1059   PROT 6.8 12/16/2023 1059   ALBUMIN 3.9 12/16/2023 1059   AST 19 12/16/2023 1059   ALT 10 12/16/2023 1059   ALKPHOS 26 (L) 12/16/2023 1059   BILITOT 0.7 12/16/2023 1059   GFRNONAA >60 12/16/2023 1059   GFRNONAA 53 (L) 10/13/2019 1532   GFRAA 62 10/13/2019 1532   COAGS Lab Results  Component Value Date   INR 1.1 12/16/2023   INR 1.2 08/05/2022   Lipid Panel    Component Value Date/Time   CHOL 178 12/17/2023 0232   TRIG 23 12/17/2023 0232   HDL 73 12/17/2023 0232   CHOLHDL 2.4 12/17/2023 0232   VLDL 5 12/17/2023 0232   LDLCALC 100 (H) 12/17/2023 0232   HgbA1C  Lab Results  Component Value Date   HGBA1C 5.1 12/16/2023   Alcohol Level    Component Value Date/Time   [facility] <15 12/16/2023 1059     SIGNIFICANT DIAGNOSTIC STUDIES MR BRAIN WO CONTRAST Result Date: 12/17/2023 EXAM: MRI BRAIN WITHOUT CONTRAST 12/17/2023 11:31:43 AM TECHNIQUE: Multiplanar multisequence MRI of the head/brain was performed without the administration of intravenous contrast. COMPARISON: 10/25/2012 CLINICAL HISTORY: Stroke, follow up FINDINGS: BRAIN AND VENTRICLES: No acute infarct. Single chronic microhemorrhage at the right caudate head. No mass. No midline shift. No hydrocephalus. Multifocal hyperintense T2-weighted signal within the cerebral white matter, most commonly due to chronic small vessel disease. The sella is unremarkable. Normal flow voids. ORBITS: Ocular lens replacements. SINUSES  AND MASTOIDS: Right mastoid effusion. BONES AND SOFT TISSUES: Normal marrow signal. No acute soft tissue abnormality. IMPRESSION: 1. No acute intracranial abnormality. 2. Multifocal white matter T2 hyperintensities, most consistent with chronic small vessel disease. 3. Single chronic microhemorrhage at the right caudate head. Electronically signed by: Franky Stanford MD 12/17/2023 11:50 AM EDT RP Workstation: [id number]   EEG adult Result Date: 12/17/2023 Shelton Arlin KIDD, MD     12/17/2023  8:40 AM Patient Name: Mataeo Ingwersen MRN: [id number] Epilepsy Attending: Arlin KIDD Shelton Referring Physician/Provider: everitt Clint Abbey Earle FORBES, NP Date: 12/16/2023 Duration: 22.30 mins Patient history: 83 y.o. male with hx of seizure, cocaine use, alcohol use, glaucoma and hypertension who presents with sudden onset dizziness and nystagmus. EEG to evaluate for seizure Level of alertness: Awake AEDs during EEG study: LEV Technical aspects: This EEG study was done with  scalp electrodes positioned according to the 10-20 International system of electrode placement. Electrical activity was reviewed with band pass filter of 1-70Hz , sensitivity of 7 uV/mm, display speed of 20mm/sec with a 60Hz  notched filter applied as appropriate. EEG data were recorded continuously and digitally stored.  Video monitoring was available and reviewed as appropriate. Description: The posterior dominant rhythm consists of 8Hz  activity of moderate voltage (25-35 uV) seen predominantly in posterior head regions, symmetric and reactive to eye opening and eye closing. Hyperventilation and photic stimulation were not performed.   IMPRESSION: This study is within normal limits. No seizures or epileptiform discharges were seen throughout the recording. A normal interictal EEG does not exclude the diagnosis of epilepsy. Arlin MALVA Krebs   ECHOCARDIOGRAM COMPLETE Result Date: 12/16/2023    ECHOCARDIOGRAM REPORT   Patient Name:   JERMARCUS MCFADYEN Date of Exam:  12/16/2023 Medical Rec #:  [id number]      Height:       67.0 in Accession #:    [id number]     Weight:       137.1 lb Date of Birth:  [birth year]       BSA:          1.723 m Patient Age:    83 years       BP:           149/71 mmHg Patient Gender: M              HR:           81 bpm. Exam Location:  Inpatient Procedure: 2D Echo, 3D Echo, Cardiac Doppler, Color Doppler and Strain Analysis            (Both Spectral and Color Flow Doppler were utilized during            procedure). Indications:    Stroke  History:        Patient has no prior history of Echocardiogram examinations.                 Risk Factors:Hypertension.  Sonographer:    Philomena Daring Referring Phys: [id number] ASHISH ARORA  Sonographer Comments: Global longitudinal strain was attempted. IMPRESSIONS  1. Left ventricular ejection fraction, by estimation, is 60 to 65%. The left ventricle has normal function. The left ventricle has no regional wall motion abnormalities. There is mild left ventricular hypertrophy. Left ventricular diastolic parameters are consistent with Grade I diastolic dysfunction (impaired relaxation). The average left ventricular global longitudinal strain is -17.7 %. The global longitudinal strain is abnormal.  2. Right ventricular systolic function is normal. The right ventricular size is normal. Tricuspid regurgitation signal is inadequate for assessing PA pressure.  3. The mitral valve is abnormal. Trivial mitral valve regurgitation.  4. The aortic valve has an indeterminant number of cusps. Aortic valve regurgitation is not visualized.  5. The inferior vena cava is normal in size with greater than 50% respiratory variability, suggesting right atrial pressure of 3 mmHg. Comparison(s): No prior Echocardiogram. FINDINGS  Left Ventricle: Left ventricular ejection fraction, by estimation, is 60 to 65%. The left ventricle has normal function. The left ventricle has no regional wall motion abnormalities. The average left ventricular global  longitudinal strain is -17.7 %. Strain was performed and the global longitudinal strain is abnormal. 3D ejection fraction reviewed and evaluated as part of the interpretation. Alternate measurement of EF is felt to be most reflective of LV function. The left ventricular internal cavity  size was normal in size. There  is mild left ventricular hypertrophy. Left ventricular diastolic parameters are consistent with Grade I diastolic dysfunction (impaired relaxation). Indeterminate filling pressures. Right Ventricle: The right ventricular size is normal. No increase in right ventricular wall thickness. Right ventricular systolic function is normal. Tricuspid regurgitation signal is inadequate for assessing PA pressure. Left Atrium: Left atrial size was normal in size. Right Atrium: Right atrial size was normal in size. Pericardium: There is no evidence of pericardial effusion. Mitral Valve: The mitral valve is abnormal. There is mild thickening of the anterior and posterior mitral valve leaflet(s). Trivial mitral valve regurgitation. Tricuspid Valve: The tricuspid valve is normal in structure. Tricuspid valve regurgitation is not demonstrated. Aortic Valve: The aortic valve has an indeterminant number of cusps. Aortic valve regurgitation is not visualized. Pulmonic Valve: The pulmonic valve was normal in structure. Pulmonic valve regurgitation is not visualized. Aorta: The aortic root and ascending aorta are structurally normal, with no evidence of dilitation. Venous: The inferior vena cava is normal in size with greater than 50% respiratory variability, suggesting right atrial pressure of 3 mmHg. IAS/Shunts: No atrial level shunt detected by color flow Doppler. Additional Comments: 3D was performed not requiring image post processing on an independent workstation and was normal.  LEFT VENTRICLE PLAX 2D LVIDd:         4.70 cm   Diastology LVIDs:         3.20 cm   LV e' medial:    8.16 cm/s LV PW:         1.10 cm   LV  E/e' medial:  9.5 LV IVS:        1.10 cm   LV e' lateral:   8.38 cm/s LVOT diam:     2.10 cm   LV E/e' lateral: 9.3 LV SV:         94 LV SV Index:   55        2D Longitudinal Strain LVOT Area:     3.46 cm  2D Strain GLS (A4C):   -18.1 % LV IVRT:       63 msec   2D Strain GLS (A3C):   -19.4 %                          2D Strain GLS (A2C):   -15.6 %                          2D Strain GLS Avg:     -17.7 %                           3D Volume EF:                          3D EF:        56 %                          LV EDV:       127 ml                          LV ESV:       56 ml                          LV SV:  71 ml RIGHT VENTRICLE             IVC RV S prime:     19.90 cm/s  IVC diam: 1.40 cm TAPSE (M-mode): 2.1 cm LEFT ATRIUM             Index        RIGHT ATRIUM           Index LA diam:        3.50 cm 2.03 cm/m   RA Area:     12.00 cm LA Vol (A2C):   47.1 ml 27.34 ml/m  RA Volume:   24.40 ml  14.17 ml/m LA Vol (A4C):   39.1 ml 22.70 ml/m LA Biplane Vol: 43.0 ml 24.96 ml/m  AORTIC VALVE LVOT Vmax:   126.00 cm/s LVOT Vmean:  82.300 cm/s LVOT VTI:    0.272 m  AORTA Ao Root diam: 3.00 cm Ao Asc diam:  3.00 cm MITRAL VALVE MV Area (PHT): 3.43 cm     SHUNTS MV Decel Time: 221 msec     Systemic VTI:  0.27 m MV E velocity: 77.60 cm/s   Systemic Diam: 2.10 cm MV A velocity: 130.00 cm/s MV E/A ratio:  0.60 Vinie Maxcy MD Electronically signed by Vinie Maxcy MD Signature Date/Time: 12/16/2023/4:35:42 PM    Final    CT HEAD WO CONTRAST ( ) Result Date: 12/16/2023 EXAM: CT HEAD WITHOUT CONTRAST 12/16/2023 01:10:27 PM TECHNIQUE: CT of the head was performed without the administration of intravenous contrast. Automated exposure control, iterative reconstruction, and/or weight based adjustment of the mA/kV was utilized to reduce the radiation dose to as low as reasonably achievable. COMPARISON: 12/16/2023 CLINICAL HISTORY: Stroke, follow up. FINDINGS: BRAIN AND VENTRICLES: No acute hemorrhage. No evidence of  acute infarct. Similar small remote lacunar infarcts in right basal ganglia. Similar appearance of mild age related parenchymal volume loss. Mild chronic microvascular ischemic changes. Residual contrast within venous sinuses from recent CT abdomen pelvis. No hydrocephalus. No extra-axial collection. No mass effect or midline shift. ORBITS: No acute abnormality. SINUSES: Trace right nasal effusion. SOFT TISSUES AND SKULL: No acute soft tissue abnormality. No skull fracture. IMPRESSION: 1. No acute intracranial abnormality. Electronically signed by: Donnice Mania MD 12/16/2023 02:27 PM EDT RP Workstation: [id number]   CT ANGIO HEAD NECK W WO CM (CODE STROKE) Result Date: 12/16/2023 EXAM: CTA Head and Neck with Intravenous Contrast. CT Head without Contrast. CLINICAL HISTORY: Stroke, follow up. Code stroke. TECHNIQUE: Axial CTA images of the head and neck performed with intravenous contrast. MIP reconstructed images were created and reviewed. Axial computed tomography images of the head/brain performed without intravenous contrast. Note: Per PQRS, the description of internal carotid artery percent stenosis, including 0 percent or normal exam, is based on [employer] Symptomatic Carotid Endarterectomy Trial (NASCET) criteria. Dose reduction technique was used including one or more of the following: automated exposure control, adjustment of mA and kV according to patient size, and/or iterative reconstruction. CONTRAST: Without and with; 100 mL iohexol  (OMNIPAQUE ) 350 MG/ML injection. COMPARISON: CT of the head dated 12/16/2023. FINDINGS: CT HEAD: BRAIN: No acute intraparenchymal hemorrhage. No mass lesion. No CT evidence for acute territorial infarct. No midline shift or extra-axial collection. VENTRICLES: No hydrocephalus. ORBITS: The orbits are unremarkable. SINUSES AND MASTOIDS: The paranasal sinuses and mastoid air cells are clear. CTA NECK: COMMON CAROTID ARTERIES: No significant stenosis. No dissection or  occlusion. INTERNAL CAROTID ARTERIES: No stenosis by NASCET criteria. No dissection or occlusion. 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 The V2 and V3 segments are otherwise normal in caliber. There is near occlusive stenosis of the V4 segment of the left vertebral artery distal to the takeoff of the posterior inferior cerebellar artery. The right vertebral artery demonstrates moderate stenosis at its origin, but otherwise appears normal in caliber. No dissection. CTA HEAD: ANTERIOR CEREBRAL ARTERIES: No significant stenosis. No occlusion. No aneurysm. MIDDLE CEREBRAL ARTERIES: No significant stenosis. No occlusion. No aneurysm. POSTERIOR CEREBRAL ARTERIES: No significant stenosis. No occlusion. No aneurysm. BASILAR ARTERY: No significant stenosis. No occlusion. No aneurysm. 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 The above findings were communicated to Dr. Arora at 11:43 am 12/16/2023. SOFT TISSUES: No acute finding. No masses or lymphadenopathy. BONES: No acute osseous abnormality. IMPRESSION: 1. 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 2. 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 3. Complete Circle of Willis. 4. Critical results communicated to Dr. Arora at 11:43 am on 12/16/23. Electronically signed by: Evalene Coho MD 12/16/2023 11:47 AM EDT RP Workstation: [id number]   CT HEAD CODE STROKE WO CONTRAST Result Date: 12/16/2023 EXAM: CT HEAD WITHOUT 12/16/2023 11:03:27 AM TECHNIQUE: CT of the head was performed without the administration of intravenous contrast. Automated exposure control, iterative reconstruction, and/or weight based adjustment of the mA/kV was utilized to reduce the radiation dose to as low as reasonably achievable.  COMPARISON: CT of the head dated 03/17/2023. CLINICAL HISTORY: Neuro deficit, acute, stroke suspected. Dizziness; LKW 0900; MD Voncile. FINDINGS: BRAIN AND VENTRICLES: No acute intracranial hemorrhage. No mass effect or midline shift. No extra-axial fluid collection. No evidence of acute infarct. No hydrocephalus. Age-related atrophy and mild cerebral white matter disease. Chronic lacunar infarcts are again noted within the right basal ganglia and right thalamus. ORBITS: No acute abnormality. SINUSES AND MASTOIDS: No acute abnormality. SOFT TISSUES AND SKULL: No acute skull fracture. No acute soft tissue abnormality. [place] Stroke Program Early CT Score (ASPECTS) ----- Ganglionic (caudate, IC, lentiform nucleus, insula, M1-M3): 7 Supraganglionic (M4-M6): 3 Total: 10 The above findings were communicated to Dr. Voncile at 11:09 am 12/16/2023. IMPRESSION: 1. No acute intracranial abnormality related to the suspected stroke. 2. Age-related atrophy and mild cerebral white matter disease. 3. 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 4. ASPECTS: 10. Electronically signed by: Evalene Coho MD 12/16/2023 11:12 AM EDT RP Workstation: [id number]   DG Foot 2 Views Left Result Date: 11/25/2023 Please see detailed radiograph report in office note.      HISTORY OF PRESENT ILLNESS 83 y.o. patient with history of of seizure, cocaine use, alcohol use, glaucoma and hypertension who presents with sudden onset dizziness and florid horizontal nystagmus. He reports some visual changes over the last few weeks and constipation but no other recent symptoms. NIH 2. Admitted for post-TNK monitoring and full stroke workup.   HOSPITAL COURSE Stroke-like symptoms s/p TNK Likely MRI negative postrior circulation stroke, less likely vestibular Neuritis Prominent spontaneous nystagmus and rest position, unilateral nystagmus direction, recent diplopia and blurry vision, supporting peripheral etiology Acute  onset worsening nystagmus and dizziness and gait difficulty with multiple stroke risk factors supporting central etiology Code Stroke CT head No acute intracranial abnormality related to the suspected stroke. 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 CTA head & neck High-grade stenosis  within the proximal V2 segment of the left vertebral artery and near-occlusive stenosis of the V4 segment of the left vertebral,artery distal to the takeoff of the posterior inferior cerebellar artery. 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 MRI  No acute intracranial abnormality EEG no seizure, normal limit 2D Echo: EF 60 to 65%  HgbA1c 5.1 UDS negative aspirin  81 mg daily prior to admission, resumed.   Therapy recommendations: Home  disposition: Home  Hypertension Home meds:  lisinopril  20mg  daily  Resume lisinopril  20 Add amlodipine 10 Long-term BP goal normotensive   Lipid management Home meds:  none LDL 100, goal < 100 No statin needed as LDL at goal   Substance and Alcohol Abuse Patient has history of alcohol and cocaine use UDS and blood alcohol negative   Other Stroke Risk Factors  Family hx stroke (mother and father) Advanced Age Former smoker, quit in 2009   Other Active Problems Hyponatremia, sodium 130-135 Leukopenia WBC 2.8 Constipation Laxatives added History of seizures PTA Keppra  250mg  BID, states he wasn't taking bc he has not had seizure in a while Routine EEG negative AEDs held for now    DISCHARGE EXAM  PHYSICAL EXAM General:  Alert, well-nourished, well-developed patient in no acute distress CV: Regular rate and rhythm on monitor Respiratory:  Regular, unlabored respirations on room air   NEURO:  Mental Status: AA&Ox3, with self-corrections. Speech/Language: speech is without dysarthria or aphasia.  Naming, repetition, fluency, and comprehension intact with intermittent paucity   Cranial Nerves:  II: PERRL.  Visual fields full.  III, IV, VI: Left eye ptosis. No gaze palsy, tracking bilaterally. However, spontaneous nystagmus at mid position, direction to the right. With bilateral movement, more prominent nystagmus on the R gaze than upper gaze than L gaze, but unilateral nystagmus direction  V: Sensation is intact to light touch and symmetrical to face.  VII: Right facial droop  VIII: hearing intact to voice. IX, X: Palate elevates symmetrically. Phonation is normal.  KP:Dynloizm shrug 5/5. XII: tongue is midline without fasciculations. Motor: 5/5 strength to all muscle groups tested.  Tone: is normal and bulk is normal Sensation- Intact to light touch bilaterally. Extinction absent to light touch to DSS.   Coordination: FTN intact bilaterally, HKS: no ataxia in BLE.No drift.  Gait- deferred   Most Recent NIH 2   Discharge Diet       Diet   Diet heart healthy/carb modified Room service appropriate? Yes with Assist; Fluid consistency: Thin   liquids  DISCHARGE PLAN Disposition: Home aspirin  81 mg daily for secondary stroke prevention  Ongoing stroke risk factor control by Primary Care Physician at time of discharge Follow-up PCP Rollene Almarie LABOR, MD in 2 weeks. Follow-up in [facility] in 8 weeks, office to schedule an appointment. Able to see NP in clinic.  50 minutes were spent preparing discharge.   [name], ACNPC-AG  [facility]  I have personally obtained history,examined this patient, reviewed notes, independently viewed imaging studies, participated in medical decision making and plan of care.ROS completed by me personally and pertinent positives fully documented  I have made any additions or clarifications directly to the above note. Agree with note above.    Eather Popp, MD Medical Director [facility] Pager: [phone] 12/20/2023 3:56 PM

## 2023-12-20 NOTE — Plan of Care (Signed)

## 2023-12-20 NOTE — Plan of Care (Signed)
   Problem: Coping: Goal: Will verbalize positive feelings about self Outcome: Progressing

## 2023-12-21 ENCOUNTER — Telehealth: Payer: Self-pay | Admitting: *Deleted

## 2023-12-21 NOTE — Transitions of Care (Post Inpatient/ED Visit) (Signed)
 12/21/2023  Name: Evan Escobar MRN: [id number] DOB: [birth year]  Today's TOC FU Call Status: Today's TOC FU Call Status:: Successful TOC FU Call Completed TOC FU Call Complete Date: 12/21/23 Patient's Name and Date of Birth confirmed.  Transition Care Management Follow-up Telephone Call Date of Discharge: 12/20/23 Discharge Facility: [facility]) Type of Discharge: Inpatient Admission Primary Inpatient Discharge Diagnosis:: Acute CVA vs. neuritis; dizziness How have you been since you were released from the hospital?: Better (per spouse: He is doing fine and we will be going to see Dr. Rollene at the end of the week.  I don't think there is a need for us  to have regular phone calls; he is doing much better) Any questions or concerns?: No  Items Reviewed: Did you receive and understand the discharge instructions provided?: Yes (thoroughly reviewed with patient's spouse who verbalizes good understanding of same) Medications obtained,verified, and reconciled?: Yes (Medications Reviewed) (Full medication reconciliation/ review completed; no concerns or discrepancies identified; confirmed patient obtained/ is taking all newly Rx'd medications as instructed; spouse-manages medications and denies questions/ concerns around medications today) Any new allergies since your discharge?: No Dietary orders reviewed?: Yes Type of Diet Ordered:: As healthy as possible Do you have support at home?: Yes People in Home [RPT]: spouse Name of Support/Comfort Primary Source: Spouse reports patient is esentially independent in self-care activities; supportive spouse [name] assists as/ if needed/ indicated  Medications Reviewed Today: Medications Reviewed Today     Reviewed by Eldean Klatt M, RN (Registered Nurse) on 12/21/23 at 1615  Med List Status: <None>   Medication Order Taking? Sig Documenting Provider Last Dose Status Informant  acetaminophen  (TYLENOL ) 500 MG tablet [id number] Yes Take 500 mg  by mouth every 6 (six) hours as needed for moderate pain or headache.  [provider]  Active Spouse/Significant Other, Pharmacy Records  amLODipine (NORVASC) 10 MG tablet [id number] Yes Take 1 tablet (10 mg total) by mouth daily. Waddell Karna LABOR, NP  Active   aspirin  EC 81 MG tablet [id number] Yes Take 1 tablet (81 mg total) by mouth daily. Swallow whole. Waddell Karna LABOR, NP  Active   brimonidine (ALPHAGAN P) 0.1 % SOLN [id number] Yes Place 1 drop into both eyes 2 (two) times daily. [provider]  Active Spouse/Significant Other, Pharmacy Records  cycloSPORINE  (RESTASIS ) 0.05 % ophthalmic emulsion [id number] Yes Place 1 drop into both eyes 2 (two) times daily. [provider]  Active Spouse/Significant Other, Pharmacy Records  latanoprost  (XALATAN ) 0.005 % ophthalmic solution [id number] Yes Place 1 drop into both eyes at bedtime. [provider]  Active Spouse/Significant Other, Pharmacy Records  levETIRAcetam  (KEPPRA ) 250 MG tablet [id number] Yes Take 1 tablet (250 mg total) by mouth 2 (two) times daily. APPOINTMENT NEEDED FOR FURTHER REFILLS Onita Duos, MD  Active Spouse/Significant Other, Pharmacy Records, Self  linaclotide  (LINZESS ) 145 MCG CAPS capsule [id number] Yes Take 1 capsule (145 mcg total) by mouth daily before breakfast. [name] ORN, MD  Active Spouse/Significant Other, Pharmacy Records  lisinopril  (ZESTRIL ) 20 MG tablet [id number] Yes TAKE 1 TABLET(20 MG) BY MOUTH DAILY Rollene Almarie LABOR, MD  Active Spouse/Significant Other, Pharmacy Records  magnesium  hydroxide (MILK OF MAGNESIA) 400 MG/5ML suspension [id number] Yes Take 30 mLs by mouth daily as needed for mild constipation. [provider]  Active Spouse/Significant Other, Pharmacy Records  meclizine (ANTIVERT) 25 MG tablet [id number] Yes Take 1 tablet (25 mg total) by mouth every 8 (eight) hours. Waddell Karna LABOR, NP  Active   Multiple Vitamin (MULTIVITAMIN)  tablet [id number] Yes Take 1 tablet by  mouth daily. [provider]  Active Spouse/Significant Other, Pharmacy Records  polyethylene glycol (MIRALAX ) 17 g packet [id number] Yes Take 17 g by mouth daily.  Patient taking differently: Take 17 g by mouth daily. 12/21/23: Spouse reports during TOC call- takes every-other day on normal basis; only takes every day when constipated without relief   Theadore Ozell HERO, MD  Active Spouse/Significant Other, Pharmacy Records  predniSONE  (DELTASONE ) 50 MG tablet [id number] Yes Take 1 tablet (50 mg total) by mouth daily with breakfast. Waddell Karna LABOR, NP  Active   tamsulosin  (FLOMAX ) 0.4 MG CAPS capsule [id number] Yes TAKE 1 CAPSULE(0.4 MG) BY MOUTH IN THE MORNING AND AT BEDTIME. Please schedule appointment with PCP Rollene Almarie LABOR, MD  Active Spouse/Significant Other, Pharmacy Records            Home Care and Equipment/Supplies: Were Home Health Services Ordered?: No Any new equipment or medical supplies ordered?: No  Functional Questionnaire: Do you need assistance with bathing/showering or dressing?: Yes (spouse supervises and assists as-if indicated: sometimes he can do it all on his own, but I help him if he needs me to) Do you need assistance with meal preparation?: Yes (spouse prepares meals) Do you need assistance with eating?: No Do you have difficulty maintaining continence: No Do you need assistance with getting out of bed/getting out of a chair/moving?: No Do you have difficulty managing or taking your medications?: Yes (spouse assists with medication management)  Follow up appointments reviewed: PCP Follow-up appointment confirmed?: Yes Date of PCP follow-up appointment?: 12/25/23 Follow-up Provider: PCP- Dr. Rollene Specialist [facility] Follow-up appointment confirmed?: No Reason Specialist Follow-Up Not Confirmed: Patient has Specialist Provider Number and will Call for Appointment (verified spouse has contact information for neurology provider) Do you need  transportation to your follow-up appointment?: No Do you understand care options if your condition(s) worsen?: Yes-patient verbalized understanding  SDOH Interventions Today    Flowsheet Row Most Recent Value  SDOH Interventions   Food Insecurity Interventions Intervention Not Indicated, FindHelp Community Resources Referral  [spouse denies true food insecurity but states would like resources for food pantries in her local area: provided numbers and addresses to 5 local food pantries within 5 miles of their home using Find Help]  Housing Interventions Intervention Not Indicated  Transportation Interventions Intervention Not Indicated  [spouse provides transportation as per baseline]  Utilities Interventions Intervention Not Indicated   See TOC assessment tabs for additional assessment/ TOC intervention information As above: spouse denies true food insecurity- but reports would like resources for food pantries near her home: provided the following resources, along with addresses and contact information, using Find Help: [place] [facility] [employer] Food pant[employer] [facility]  Patient's spouse declines need for ongoing/ further care management/ coordination outreach; declines enrollment in 30-day TOC program- declines taking my direct phone number should needs/ concerns arise post-TOC call   Pls call/ message for questions,  Davene Jobin Mckinney Pegge Cumberledge, RN, BSN, Media Planner  Transitions of Care  [facility]  [place] [phone]: direct office

## 2023-12-25 ENCOUNTER — Inpatient Hospital Stay: Admitting: Internal Medicine

## 2023-12-28 ENCOUNTER — Ambulatory Visit (INDEPENDENT_AMBULATORY_CARE_PROVIDER_SITE_OTHER): Admitting: Internal Medicine

## 2023-12-28 ENCOUNTER — Encounter: Payer: Self-pay | Admitting: Internal Medicine

## 2023-12-28 VITALS — BP 130/60 | HR 70 | Temp 97.9°F | Ht 67.0 in | Wt 137.0 lb

## 2023-12-28 DIAGNOSIS — K5904 Chronic idiopathic constipation: Secondary | ICD-10-CM

## 2023-12-28 DIAGNOSIS — H70001 Acute mastoiditis without complications, right ear: Secondary | ICD-10-CM | POA: Diagnosis not present

## 2023-12-28 DIAGNOSIS — H532 Diplopia: Secondary | ICD-10-CM | POA: Diagnosis not present

## 2023-12-28 DIAGNOSIS — Z8673 Personal history of transient ischemic attack (TIA), and cerebral infarction without residual deficits: Secondary | ICD-10-CM

## 2023-12-28 MED ORDER — AMOXICILLIN-POT CLAVULANATE 875-125 MG PO TABS: 1.0000 | ORAL_TABLET | Freq: Two times a day (BID) | ORAL | 0 refills | Status: AC

## 2023-12-28 NOTE — Progress Notes (Unsigned)
 Subjective:   Patient ID: Evan Escobar, male    DOB: September 19, 1940, 83 y.o.   MRN: [id number]  Discussed the use of AI scribe software for clinical note transcription with the patient, who gave verbal consent to proceed.  History of Present Illness Evan Escobar is an 83 year old male who presents with dizziness and visual disturbances following a recent hospitalization for suspected stroke treated with clot busting medicine and monitored in the neuro ICU s/p administration.   He was recently hospitalized with symptoms of dizziness and visual disturbances and received thrombolytic therapy. Follow-up imaging did not show a new stroke, and he was monitored closely in the ICU. Since discharge, he continues to experience dizziness and visual disturbances.  He experiences persistent dizziness, which has not improved since the onset. His left eye sees double, contributing to his dizziness, while his right eye has worsened in vision clarity, although he can see clearly with glasses. The vision in the right eye is described as 'dimmer than it was,' and wearing glasses causes headaches.  During the hospital stay, a repeat MRI revealed a fluid pocket near the right mastoid bone, which was not treated with antibiotics at the time. He has no ear pain or sinus pressure but reports a runny nose and some discomfort at the back of the head some mild ear pressure.   No fevers, chills, numbness, tingling, or new weakness. He also reports chronic constipation, which has worsened recently, describing a sensation of needing to defecate without successful bowel movements.  Review of Systems  Constitutional:  Positive for activity change and fatigue.  HENT:  Positive for congestion, postnasal drip and sinus pressure.   Eyes:  Positive for visual disturbance.  Respiratory:  Negative for cough, chest tightness and shortness of breath.   Cardiovascular:  Negative for chest pain, palpitations and leg swelling.   Gastrointestinal:  Negative for abdominal distention, abdominal pain, constipation, diarrhea, nausea and vomiting.  Musculoskeletal: Negative.   Skin: Negative.   Neurological:  Positive for dizziness and weakness.  Psychiatric/Behavioral: Negative.      Objective:  Physical Exam Constitutional:      Appearance: He is well-developed.  HENT:     Head: Normocephalic and atraumatic.     Right Ear: Tympanic membrane normal.     Left Ear: Tympanic membrane normal.  Cardiovascular:     Rate and Rhythm: Normal rate and regular rhythm.  Pulmonary:     Effort: Pulmonary effort is normal. No respiratory distress.     Breath sounds: Normal breath sounds. No wheezing or rales.  Abdominal:     General: Bowel sounds are normal. There is no distension.     Palpations: Abdomen is soft.     Tenderness: There is no abdominal tenderness.  Musculoskeletal:     Cervical back: Normal range of motion.  Skin:    General: Skin is warm and dry.  Neurological:     Mental Status: He is alert and oriented to person, place, and time.     Coordination: Coordination abnormal.     Vitals:   12/28/23 1020  BP: 130/60  Pulse: 70  Temp: 97.9 F (36.6 C)  TempSrc: Oral  SpO2: 98%  Weight: 137 lb (62.1 kg)  Height: 5' 7 (1.702 m)    Assessment and Plan Assessment & Plan Acute right mastoiditis with associated dizziness   Acute right mastoiditis with a fluid pocket near the right mastoid bone on MRI likely causes dizziness. No prior antibiotics have been administered. Prescribe  Augmentin  for 10 days. Monitor dizziness improvement within 2-3 days.  Diplopia and visual disturbance   Persistent diplopia and visual disturbance, worse in the right eye, may be linked to mastoiditis or peripheral causes. Refer to an eye specialist for further evaluation.  Chronic constipation, worsening   Chronic constipation has worsened with difficulty in bowel movements despite the urge.  Hx stroke: recent stroke  and administration of medication neurology suspects prevented damage as repeat MRI without evidence for damage. He did have some improvement in dizziness in hospital but it is persistent currently. He is on aspirin  and LDL borderline not on statin will need to start he wishes to wait until dizziness is improving.

## 2023-12-28 NOTE — Patient Instructions (Signed)
 We have sent in the augmentin  to take for the sinus infection. Take 1 pill twice a day for 10 days. Let us  know in 2-3 days if no improvement.

## 2023-12-31 DIAGNOSIS — H70009 Acute mastoiditis without complications, unspecified ear: Secondary | ICD-10-CM | POA: Insufficient documentation

## 2023-12-31 DIAGNOSIS — H532 Diplopia: Secondary | ICD-10-CM | POA: Insufficient documentation

## 2023-12-31 NOTE — Assessment & Plan Note (Signed)
 Acute right mastoiditis with a fluid pocket near the right mastoid bone on MRI likely causes dizziness. No prior antibiotics have been administered. Prescribe Augmentin  for 10 days. Monitor dizziness improvement within 2-3 days.

## 2023-12-31 NOTE — Assessment & Plan Note (Signed)
 recent stroke and administration of medication neurology suspects prevented damage as repeat MRI without evidence for damage. He did have some improvement in dizziness in hospital but it is persistent currently. He is on aspirin  and LDL borderline not on statin will need to start he wishes to wait until dizziness is improving.

## 2023-12-31 NOTE — Assessment & Plan Note (Signed)
 Chronic constipation has worsened with difficulty in bowel movements despite the urge. Advised to continue linzess .

## 2023-12-31 NOTE — Assessment & Plan Note (Signed)
 Persistent diplopia and visual disturbance, worse in the right eye, may be linked to mastoiditis or peripheral causes. Refer to an eye specialist for further evaluation.

## 2024-01-06 ENCOUNTER — Ambulatory Visit

## 2024-01-08 ENCOUNTER — Ambulatory Visit: Attending: Nurse Practitioner

## 2024-01-08 ENCOUNTER — Ambulatory Visit: Admitting: Physical Therapy

## 2024-01-08 ENCOUNTER — Other Ambulatory Visit: Payer: Self-pay

## 2024-01-08 VITALS — BP 149/63 | HR 71

## 2024-01-08 DIAGNOSIS — R2681 Unsteadiness on feet: Secondary | ICD-10-CM | POA: Insufficient documentation

## 2024-01-08 DIAGNOSIS — R41842 Visuospatial deficit: Secondary | ICD-10-CM | POA: Insufficient documentation

## 2024-01-08 DIAGNOSIS — R2990 Unspecified symptoms and signs involving the nervous system: Secondary | ICD-10-CM | POA: Diagnosis present

## 2024-01-08 DIAGNOSIS — R42 Dizziness and giddiness: Secondary | ICD-10-CM | POA: Insufficient documentation

## 2024-01-08 DIAGNOSIS — R278 Other lack of coordination: Secondary | ICD-10-CM | POA: Insufficient documentation

## 2024-01-08 DIAGNOSIS — H409 Unspecified glaucoma: Secondary | ICD-10-CM | POA: Diagnosis present

## 2024-01-08 DIAGNOSIS — M6281 Muscle weakness (generalized): Secondary | ICD-10-CM | POA: Diagnosis present

## 2024-01-08 NOTE — Therapy (Unsigned)
 OUTPATIENT PHYSICAL THERAPY VESTIBULAR EVALUATION     Patient Name: Evan Escobar MRN: [id number] DOB:October 17, 1940, 83 y.o., male Today's Date: 01/08/2024  END OF SESSION:  PT End of Session - 01/08/24 1758     Visit Number 1    Number of Visits 9    Date for Recertification  02/07/24    Authorization Type [employer] MEDICARE    PT Start Time 1105    PT Stop Time 1148    PT Time Calculation (min) 43 min    Behavior During Therapy Flat affect;WFL for tasks assessed/performed          Past Medical History:  Diagnosis Date   Allergy    Arthritis    Cataract    Glaucoma    Hypertension    Normocytic anemia 09/25/2012   Polyp, sigmoid colon 11/05/2011   Seizure (HCC)    Seizures (HCC) 03/17/2013   Urinary incontinence 01/18/2023   Past Surgical History:  Procedure Laterality Date   ANTERIOR CERVICAL DECOMP/DISCECTOMY FUSION N/A 05/22/2017   Procedure: Anterior Cervical Discectomy Fusion - Cervical Three-Cervical Four - Cervical Four-Cervical Five;  Surgeon: Onetha Kuba, MD;  Location: [facility] OR;  Service: Neurosurgery;  Laterality: N/A;   CATARACT EXTRACTION, BILATERAL     EYE SURGERY     GLAUCOMA REPAIR  10 yrs ago   TRANSURETHRAL RESECTION OF PROSTATE N/A 08/06/2022   Procedure: TRANSURETHRAL RESECTION OF THE PROSTATE (TURP) UNROOFING PROSTATIC ABSCESS;  Surgeon: Watt Rush, MD;  Location: [facility] ORS;  Service: Urology;  Laterality: N/A;  1 HR FOR CASE   Patient Active Problem List   Diagnosis Date Noted   Acute mastoiditis 12/31/2023   Diplopia 12/31/2023   Hyperlipidemia 12/19/2023   Stroke-like symptoms 12/17/2023   Weakness 10/05/2023   Other fatigue 10/05/2023   Hyponatremia 10/05/2023   Lower respiratory infection 09/28/2023   Flu-like symptoms 09/28/2023   Chest congestion 09/28/2023   Dog bite 09/08/2023   Need for tetanus booster 09/08/2023   Bilateral impacted cerumen 08/27/2023   Hx of completed stroke 03/27/2023   Urinary incontinence 01/18/2023   Prostate  abscess 08/04/2022   Anemia of chronic disease 08/04/2022   History of recurrent UTIs 05/12/2022   Toenail fungus 07/30/2021   Deficiency anemia 10/13/2019   Persistent cough 10/13/2019   Chronic idiopathic constipation 10/13/2019   Cervical radiculopathy 01/10/2019   Left lumbar radiculopathy 12/09/2017   Finger pain, right 10/06/2017   Hearing loss 09/09/2016   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   Encounter for general adult medical examination with abnormal findings 08/28/2015   Allergic rhinitis 02/26/2015   Glaucoma 05/07/2013   Essential hypertension 05/07/2013   Seizures (HCC) 03/17/2013    PCP: Rollene Almarie LABOR, MD REFERRING PROVIDER: Waddell Karna LABOR, NP  REFERRING DIAG: R29.90 (ICD-10-CM) - Stroke-like symptoms   THERAPY DIAG:  Unsteadiness on feet  Dizziness and giddiness  Muscle weakness (generalized)  ONSET DATE: 12/20/2023  Rationale for Evaluation and Treatment: Rehabilitation  SUBJECTIVE:   SUBJECTIVE STATEMENT: Sees double with both eyes Tends to close L eye Funny ache in head but then denied having this feeling right after saying it. Pt then denies headaches Pt states he is dizzy cause he sees double but dizziness goes away when he closes his eyes Patient says he is getting a new prescription for glasses but doesn't knoe or think he is getting prisms Pt accompanied by: significant other  PERTINENT HISTORY:  Patient is an 83 YO male with a PMH of stroke-like symptoms s/p TNK.  Likely MRI negative posterior circulation smallstroke not visualized on MRI, less likely vestibular Neuritis, eizure, cocaine use, alcohol use, glaucoma and hypertension who presents with sudden onset dizziness and florid horizontal nystagmus. He reports some visual changes over the last few weeks and constipation but no other recent symptoms. NIH 2. Admitted for post-TNK monitoring and full stroke workup.   Stroke-like symptoms s/p TNK Likely MRI  negative postrior circulation stroke, less likely vestibular Neuritis  PAIN:  Are you having pain? {OPRCPAIN:27236}  PRECAUTIONS: {Therapy precautions:24002}  RED FLAGS: {PT Red Flags:29287}   WEIGHT BEARING RESTRICTIONS: {Yes ***/No:24003}  FALLS: Has patient fallen in last 6 months? {fallsyesno:27318}  LIVING ENVIRONMENT: Lives with: {OPRC lives with:25569::lives with their family} Lives in: {Lives in:25570} Stairs: {opstairs:27293} Has following equipment at home: {Assistive devices:23999} [name], wife, 1 story home, 2 steps to enter home, railing in front but not the back, rushing to bathroom Denies issue with bathroom Sometimes uses walker with 2 wheels on front PLOF: {PLOF:24004}  PATIENT GOALS: ***  OBJECTIVE:  Note: Objective measures were completed at Evaluation unless otherwise noted.  DIAGNOSTIC FINDINGS:  12/17/23: MR BRAIN WO CONTRAST: IMPRESSION: 1. No acute intracranial abnormality. 2. Multifocal white matter T2 hyperintensities, most consistent with chronic small vessel disease. 3. Single chronic microhemorrhage at the right caudate head.  12/16/2023: CT HEAD WO CONTRAST: IMPRESSION: 1. No acute intracranial abnormality.  12/16/2023: CT ANGIO HEAD NECK W WO CM IMPRESSION: 1. 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 2. 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 3. Complete Circle of Willis. 4. Critical results communicated to Dr. Arora at 11:43 am on 12/16/23.  12/16/23: CT HEAD CODE STROKE WO CONTRAST IMPRESSION: 1. No acute intracranial abnormality related to the suspected stroke. 2. Age-related atrophy and mild cerebral white matter disease. 3. 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 4. ASPECTS:  10.    COGNITION: Overall cognitive status: {cognition:24006}   SENSATION: {sensation:27233}  EDEMA:  {edema:24020}  MUSCLE TONE:  {LE tone:25568}  DTRs:  {DTR SITE:24025}  POSTURE:  {posture:25561}  Cervical ROM:    {AROM/PROM:27142} A/PROM (deg) eval  Flexion   Extension   Right lateral flexion   Left lateral flexion   Right rotation   Left rotation   (Blank rows = not tested)  STRENGTH: ***  LOWER EXTREMITY MMT:   MMT Right eval Left eval  Hip flexion    Hip abduction    Hip adduction    Hip internal rotation    Hip external rotation    Knee flexion    Knee extension    Ankle dorsiflexion    Ankle plantarflexion    Ankle inversion    Ankle eversion    (Blank rows = not tested)  BED MOBILITY:  {Bed mobility:24027}  TRANSFERS: Assistive device utilized: {Assistive devices:23999}  Sit to stand: {Levels of assistance:24026} Stand to sit: {Levels of assistance:24026} Chair to chair: {Levels of assistance:24026} Floor: {Levels of assistance:24026}  RAMP: {Levels of assistance:24026}  CURB: {Levels of assistance:24026}  GAIT: Gait pattern: {gait characteristics:25376} Distance walked: *** Assistive device utilized: {Assistive devices:23999} Level of assistance: {Levels of assistance:24026} Comments: ***  FUNCTIONAL TESTS:  {Functional tests:24029}  PATIENT SURVEYS:  {rehab surveys:24030}  VESTIBULAR ASSESSMENT:  GENERAL OBSERVATION: ***   SYMPTOM BEHAVIOR:  Subjective history: ***  Non-Vestibular symptoms: {nonvestibular symptoms:25260}  Type of dizziness: {Type of Dizziness:25255}  Frequency: ***  Duration: ***  Aggravating factors: {Aggravating Factors:25258}  Relieving factors: {Relieving Factors:25259}  Progression of symptoms: {DESC; BETTER/WORSE:18575}  OCULOMOTOR EXAM:  Ocular Alignment: {Ocular Alignment:25262}  Ocular ROM: No nystagmus at end range, slow eye movement and corrective saccades throughout  Spontaneous  Nystagmus: absent  Gaze-Induced Nystagmus: {gaze-induced nystagmus:25264}  Smooth Pursuits: saccades  Saccades: extra eye movements and slow  Convergence/Divergence: unable to assess, double vision at all times  Cover-cross-cover test: {cover test:33756}  FRENZEL - FIXATION SUPRESSED:  Ocular Alignment: {Ocular Alignment:25262}  Spontaneous Nystagmus: {Spontaneous nystagmus:25263}  Gaze-Induced Nystagmus: {gaze-induced nystagmus:25264}  Horizontal head shaking - induced nystagmus: {head shaking induced nystagmus:25267}  Vertical head shaking - induced nystagmus: {head shaking induced nystagmus:25267}  Positional tests: {Positional tests:25271}  Pressure tests: {frenzel pressure tests:25268}  VESTIBULAR - OCULAR REFLEX:   Slow VOR: {slow VOR:25290}  VOR Cancellation: {vor cancellation:25291}  Head-Impulse Test: {head impulse test:25272}  Dynamic Visual Acuity: {dynamic visual acuity:25273}   POSITIONAL TESTING: {Positional tests:25271}  MOTION SENSITIVITY:  Motion Sensitivity Quotient Intensity: 0 = none, 1 = Lightheaded, 2 = Mild, 3 = Moderate, 4 = Severe, 5 = Vomiting  Intensity  1. Sitting to supine   2. Supine to L side   3. Supine to R side   4. Supine to sitting   5. L Hallpike-Dix   6. Up from L    7. R Hallpike-Dix   8. Up from R    9. Sitting, head tipped to L knee   10. Head up from L knee   11. Sitting, head tipped to R knee   12. Head up from R knee   13. Sitting head turns x5   14.Sitting head nods x5   15. In stance, 180 turn to L    16. In stance, 180 turn to R     OTHOSTATICS: {Exam; orthostatics:31331}  FUNCTIONAL GAIT: {Functional tests:24029}                                                                                                                             TREATMENT DATE: ***   Canalith Repositioning:  {Canalith Repositioning:25283} Gaze Adaptation:  {gaze adaptation:25286} Habituation:  {habituation:25288} Other: ***  PATIENT  EDUCATION: Education details: *** Person educated: {Person educated:25204} Education method: {Education Method:25205} Education comprehension: {Education Comprehension:25206}  HOME EXERCISE PROGRAM:  GOALS: Goals reviewed with patient? {yes/no:20286}  SHORT TERM GOALS: Target date: ***  *** Baseline: Goal status: {GOALSTATUS:25110}  2.  *** Baseline:  Goal status: {GOALSTATUS:25110}  3.  *** Baseline:  Goal status: {GOALSTATUS:25110}  4.  *** Baseline:  Goal status: {GOALSTATUS:25110}  5.  *** Baseline:  Goal status: {GOALSTATUS:25110}  6.  *** Baseline:  Goal status: {GOALSTATUS:25110}  LONG TERM GOALS: Target date: ***  *** Baseline:  Goal status: {GOALSTATUS:25110}  2.  *** Baseline:  Goal status: {GOALSTATUS:25110}  3.  *** Baseline:  Goal status: {GOALSTATUS:25110}  4.  *** Baseline:  Goal status: {GOALSTATUS:25110}  5.  *** Baseline:  Goal status: {GOALSTATUS:25110}  6.  *** Baseline:  Goal status: {GOALSTATUS:25110}  ASSESSMENT:  CLINICAL IMPRESSION: Patient is a *** y.o. *** who was seen today for physical therapy evaluation and treatment for ***.    Unsteady gait and decreased mobility due to dizziness and double vision. Patient closes left eye often and often both eyes for comfort. Pt sees less or no double vision when left eye closed. No dizziness present when eyes are closed or when left eye closed.   BP is   OBJECTIVE IMPAIRMENTS: {opptimpairments:25111}.   ACTIVITY LIMITATIONS: {activitylimitations:27494}  PARTICIPATION LIMITATIONS: {participationrestrictions:25113}  PERSONAL FACTORS: {Personal factors:25162} are also affecting patient's functional outcome.   REHAB POTENTIAL: {rehabpotential:25112}  CLINICAL DECISION MAKING: {clinical decision making:25114}  EVALUATION COMPLEXITY: {Evaluation complexity:25115}   PLAN:  PT FREQUENCY: {rehab frequency:25116}  PT DURATION: {rehab duration:25117}  PLANNED  INTERVENTIONS: {rehab planned interventions:25118::97110-Therapeutic exercises,97530- Therapeutic [phone]- Neuromuscular re-education,97535- Self Rjmz,02859- Manual therapy,Patient/Family education}  PLAN FOR NEXT SESSION: ***   Emmalene Sherry, Student-PT 01/08/2024, 6:00 PM

## 2024-01-08 NOTE — Therapy (Signed)
 OUTPATIENT OCCUPATIONAL THERAPY NEURO EVALUATION  Patient Name: Evan Escobar MRN: [id number] DOB:[birth year], 83 y.o., male Today's Date: 01/08/2024  PCP: Rollene Almarie LABOR, MD REFERRING PROVIDER: Waddell Karna LABOR, NP  END OF SESSION:  OT End of Session - 01/08/24 2220     Visit Number 1    Number of Visits 7   plus eval   Date for Recertification  02/19/24    Authorization Type [employer] MCR-auth required    OT Start Time 1017    OT Stop Time 1100    OT Time Calculation (min) 43 min    Activity Tolerance Patient tolerated treatment well    Behavior During Therapy Flat affect;WFL for tasks assessed/performed          Past Medical History:  Diagnosis Date   Allergy    Arthritis    Cataract    Glaucoma    Hypertension    Normocytic anemia 09/25/2012   Polyp, sigmoid colon 11/05/2011   Seizure (HCC)    Seizures (HCC) 03/17/2013   Urinary incontinence 01/18/2023   Past Surgical History:  Procedure Laterality Date   ANTERIOR CERVICAL DECOMP/DISCECTOMY FUSION N/A 05/22/2017   Procedure: Anterior Cervical Discectomy Fusion - Cervical Three-Cervical Four - Cervical Four-Cervical Five;  Surgeon: Onetha Kuba, MD;  Location: [facility] OR;  Service: Neurosurgery;  Laterality: N/A;   CATARACT EXTRACTION, BILATERAL     EYE SURGERY     GLAUCOMA REPAIR  10 yrs ago   TRANSURETHRAL RESECTION OF PROSTATE N/A 08/06/2022   Procedure: TRANSURETHRAL RESECTION OF THE PROSTATE (TURP) UNROOFING PROSTATIC ABSCESS;  Surgeon: Watt Rush, MD;  Location: [facility];  Service: Urology;  Laterality: N/A;  1 HR FOR CASE   Patient Active Problem List   Diagnosis Date Noted   Acute mastoiditis 12/31/2023   Diplopia 12/31/2023   Hyperlipidemia 12/19/2023   Stroke-like symptoms 12/17/2023   Weakness 10/05/2023   Other fatigue 10/05/2023   Hyponatremia 10/05/2023   Lower respiratory infection 09/28/2023   Flu-like symptoms 09/28/2023   Chest congestion 09/28/2023   Dog bite 09/08/2023   Need for tetanus  booster 09/08/2023   Bilateral impacted cerumen 08/27/2023   Hx of completed stroke 03/27/2023   Urinary incontinence 01/18/2023   Prostate abscess 08/04/2022   Anemia of chronic disease 08/04/2022   History of recurrent UTIs 05/12/2022   Toenail fungus 07/30/2021   Deficiency anemia 10/13/2019   Persistent cough 10/13/2019   Chronic idiopathic constipation 10/13/2019   Cervical radiculopathy 01/10/2019   Left lumbar radiculopathy 12/09/2017   Finger pain, right 10/06/2017   Hearing loss 09/09/2016   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   Encounter for general adult medical examination with abnormal findings 08/28/2015   Allergic rhinitis 02/26/2015   Glaucoma 05/07/2013   Essential hypertension 05/07/2013   Seizures (HCC) 03/17/2013    ONSET DATE: 12/20/2023 referral date, 12/16/23-12/20/23 hospitalizaiton  REFERRING DIAG: R29.90 (ICD-10-CM) - Stroke-like symptoms  THERAPY DIAG:  Visuospatial deficit  Other lack of coordination  Glaucoma, unspecified glaucoma type, unspecified laterality  Stroke-like symptoms  Rationale for Evaluation and Treatment: Rehabilitation  SUBJECTIVE:   SUBJECTIVE STATEMENT: Pt reports having diplopia since hospitalization Pt accompanied by: significant other wife [name]  PERTINENT HISTORY: glaucoma, seizures, essential HTN. Pt was hospitalized from 1-/29/25-11/2/25 for displaying stroke like symptoms, however per CT/MRI no acute intracranial abnormality, chronic lacunar infarcts in R basal ganglia and R thalamus, likely vestibular neuritis.   PRECAUTIONS: Other: seizures hx, low vision, blood thinners  WEIGHT BEARING RESTRICTIONS: No  PAIN:  Are  you having pain? No  FALLS: Has patient fallen in last 6 months? No  LIVING ENVIRONMENT: Lives with: lives with their spouse Lives in: House/apartment Stairs: Yes: External: 2 steps; can reach both Has following equipment at home: Single point cane and Walker - 2  wheeled  PLOF: Independent  PATIENT GOALS: To work on my vision and be able to walk. I haven't driven since the hospital  OBJECTIVE:  Note: Objective measures were completed at Evaluation unless otherwise noted.  HAND DOMINANCE: Left  ADLs: Pt reports completing all ADLs independently/Mod I with wife confirming, requires extended time  Equipment: none  IADLs: Wife completes all IADL d/t visual concerns   MOBILITY STATUS: uses cane for functional mobility  POSTURE COMMENTS:  rounded shoulders and forward head  ACTIVITY TOLERANCE: Activity tolerance: Pt reports he hasn't done much since hospitalization  FUNCTIONAL OUTCOME MEASURES: PSFS: 7  UPPER EXTREMITY ROM:    Active ROM Right eval Left eval  Shoulder flexion    Shoulder abduction    Shoulder adduction    Shoulder extension    Shoulder internal rotation    Shoulder external rotation    Elbow flexion    Elbow extension    Wrist flexion    Wrist extension    Wrist ulnar deviation    Wrist radial deviation    Wrist pronation    Wrist supination    (Blank rows = not tested)  UPPER EXTREMITY MMT:     MMT Right eval Left eval  Shoulder flexion    Shoulder abduction    Shoulder adduction    Shoulder extension    Shoulder internal rotation    Shoulder external rotation    Middle trapezius    Lower trapezius    Elbow flexion    Elbow extension    Wrist flexion    Wrist extension    Wrist ulnar deviation    Wrist radial deviation    Wrist pronation    Wrist supination    (Blank rows = not tested)  HAND FUNCTION: Grip strength: Right: 67 lbs; Left: 70.3 lbs  COORDINATION: 9 Hole Peg test: Right: 46.89 sec; Left: 44.95 sec Box and Blocks:  Right 27blocks, Left 29 blocks  SENSATION: WFL   MUSCLE TONE: WNL  COGNITION: Overall cognitive status: Within functional limits for tasks assessed  VISION: Subjective report: Pt reports double vision  Baseline vision: diplopia Visual history:  glaucoma  VISION ASSESSMENT: Visual Fields: Right visual field deficits and Left visual field deficits Diplopia assessment: disappears with one eye closed, objects splits side to side, and present in Left gaze  Patient has difficulty with following activities due to following visual impairments: walking, shaving  PERCEPTION: Not tested  PRAXIS: Not tested  OBSERVATIONS: Pt with hx of glaucoma and visual concerns s/p recent hospitalization for stroke like symptoms. Diplopia affecting balance and safety with ADL/IADL  TREATMENT DATE: 01/08/24   Pt and spouse educated in purpose of OT, goals, and POC. Pt reports that he is to receive glasses (unclear at this time if they are prism  glasses) but they have not arrived yet.      PATIENT EDUCATION: Education details: SEE ABOVE Person educated: Patient and Spouse Education method: Explanation Education comprehension: verbalized understanding  HOME EXERCISE PROGRAM:    GOALS: Goals reviewed with patient? Yes  SHORT TERM GOALS: Target date: 01/31/24  Pt will be independent with HEP for low vision/diplopia Baseline: New to OP OT Goal status: INITIAL  2.  Pt will be educated in AE for improved ease with ADLs prn Baseline: New to OP OT Goal status: INITIAL  3.  Pt will be educated in visual compensatory strategies to ensure safety in the home and with functional mobility Baseline: New to OP OT Goal status: INITIAL   LONG TERM GOALS: Target date: 02/19/24  Pt will demonstrate improved function by an average PSFS score increase of at least 1 point or an increase in individual score by 2 points Baseline: Avg. Score 7 Goal status: INITIAL    ASSESSMENT:  CLINICAL IMPRESSION: Patient is a 83 y.o. male who was seen today for occupational therapy evaluation for s/p hospitalization for stroke like  symptoms, with chief concern of diplopia and visual deficits. Hx includes glaucoma, seizures, essential HTN. Patient currently presents slightly below baseline level of functioning demonstrating functional deficits and impairments as noted below. Pt would benefit from skilled OT services in the outpatient setting to work on impairments as noted below to help pt return to PLOF as able.     PERFORMANCE DEFICITS: in functional skills including ADLs, IADLs, balance, and vision, cognitive skills including safety awareness, and psychosocial skills including habits.   IMPAIRMENTS: are limiting patient from ADLs, IADLs, and leisure.   CO-MORBIDITIES: has co-morbidities such as seizure hx that affects occupational performance. Patient will benefit from skilled OT to address above impairments and improve overall function.  MODIFICATION OR ASSISTANCE TO COMPLETE EVALUATION: No modification of tasks or assist necessary to complete an evaluation.  OT OCCUPATIONAL PROFILE AND HISTORY: Problem focused assessment: Including review of records relating to presenting problem.  CLINICAL DECISION MAKING: LOW - limited treatment options, no task modification necessary  REHAB POTENTIAL: Fair as evidenced by comorbidity presence  EVALUATION COMPLEXITY: Low    PLAN:  OT FREQUENCY: 1x/week  OT DURATION: 6 weeks  PLANNED INTERVENTIONS: 97168 OT Re-evaluation, 97535 self care/ADL training, 02889 therapeutic exercise, 97530 therapeutic activity, 97112 neuromuscular re-education, visual/perceptual remediation/compensation, energy conservation, coping strategies training, patient/family education, and DME and/or AE instructions  RECOMMENDED OTHER SERVICES: none at this time  CONSULTED AND AGREED WITH PLAN OF CARE: Patient  PLAN FOR NEXT SESSION: Diplopia/vision HEP Further functional outcome measures prn   Rocky Dutch, OT 01/08/2024, 10:22 PM

## 2024-01-26 NOTE — Progress Notes (Signed)
 [facility] Worker Note Stroke Post Discharge Follow-Up  Chey Cho [id number]   Contact Type:    Encounter Date: 01/26/2024  Outreach Project:    Managed Medicaid Plan Participant:   PCP:   Payor Status:        Past Medical History:  Diagnosis Date   Allergy    Arthritis    Cataract    Glaucoma    Hypertension    Normocytic anemia 09/25/2012   Polyp, sigmoid colon 11/05/2011   Seizure (HCC)    Seizures (HCC) 03/17/2013   Urinary incontinence 01/18/2023   Social History   Substance and Sexual Activity  Alcohol Use No   Alcohol/week: 0.0 standard drinks of alcohol   Comment: quit in 2009   Social History   Substance and Sexual Activity  Drug Use No   Social History   Tobacco Use  Smoking Status Former   Current packs/day: 0.00   Types: Cigarettes   Quit date: 02/18/2007   Years since quitting: 16.9  Smokeless Tobacco Never  Tobacco Comments   not sure but a while ago; quit smoking and ETOH    SDOH Screenings   Food Insecurity: No Food Insecurity (12/21/2023)  Housing: Unknown (12/21/2023)  Transportation Needs: No Transportation Needs (12/21/2023)  Utilities: Not At Risk (12/21/2023)  Alcohol Screen: Low Risk  (09/28/2023)  Depression (PHQ2-9): Low Risk  (09/28/2023)  Financial Resource Strain: Low Risk  (09/28/2023)  Physical Activity: Sufficiently Active (09/28/2023)  Social Connections: Moderately Integrated (12/17/2023)  Stress: No Stress Concern Present (09/28/2023)  Tobacco Use: Medium Risk (12/28/2023)  Health Literacy: Adequate Health Literacy (09/28/2023)   Referrals (if applicable):        Education:    Limiting Factors:     Follow-up:     Follow-up Type:      Cherise LOISE Finder, NT

## 2024-01-30 ENCOUNTER — Ambulatory Visit (HOSPITAL_COMMUNITY)
Admission: EM | Admit: 2024-01-30 | Discharge: 2024-01-30 | Disposition: A | Discharge status: Home / Self Care | Attending: Emergency Medicine | Admitting: Emergency Medicine

## 2024-01-30 ENCOUNTER — Encounter (HOSPITAL_COMMUNITY): Payer: Self-pay

## 2024-01-30 DIAGNOSIS — R051 Acute cough: Secondary | ICD-10-CM

## 2024-01-30 DIAGNOSIS — B9789 Other viral agents as the cause of diseases classified elsewhere: Secondary | ICD-10-CM | POA: Diagnosis not present

## 2024-01-30 DIAGNOSIS — J988 Other specified respiratory disorders: Secondary | ICD-10-CM | POA: Diagnosis not present

## 2024-01-30 LAB — POC COVID19/FLU A&B COMBO
Covid Antigen, POC: NEGATIVE
Influenza A Antigen, POC: NEGATIVE
Influenza B Antigen, POC: NEGATIVE

## 2024-01-30 MED ORDER — BENZONATATE 100 MG PO CAPS: 100.0000 mg | ORAL_CAPSULE | Freq: Three times a day (TID) | ORAL | 0 refills | Status: AC

## 2024-01-30 MED ORDER — AZELASTINE HCL 0.1 % NA SOLN: 2.0000 | Freq: Two times a day (BID) | NASAL | 0 refills | Status: AC

## 2024-01-30 NOTE — ED Triage Notes (Signed)
 Patient c/o a headache, productive cough with clear sputum, and nasal congestion x 2-3 days.  Patient denies taking any medication for his symptoms.

## 2024-01-30 NOTE — ED Provider Notes (Signed)
 [facility]    CSN: [id number] Arrival date & time: 01/30/24  9145      History   Chief Complaint Chief Complaint  Patient presents with   Cough   Nasal Congestion   Headache    HPI Evan Escobar is a 83 y.o. male.   Patient presents with productive cough, nasal congestion, and intermittent headache that began about 2 to 3 days ago.  Denies fever, body aches, chills, chest pain, shortness of breath, nausea, vomiting, and diarrhea.  Patient denies any known sick exposures.  Patient denies taking medication for his symptoms.  Significant past medical history includes hypertension, recent CVA, hyperlipidemia, and seizures.  Patient is not currently on any blood thinners and only takes a baby aspirin  daily.  The history is provided by the patient and medical records.  Cough Associated symptoms: headaches   Headache Associated symptoms: cough     Past Medical History:  Diagnosis Date   Allergy    Arthritis    Cataract    Glaucoma    Hypertension    Normocytic anemia 09/25/2012   Polyp, sigmoid colon 11/05/2011   Seizure (HCC)    Seizures (HCC) 03/17/2013   Urinary incontinence 01/18/2023    Patient Active Problem List   Diagnosis Date Noted   Acute mastoiditis 12/31/2023   Diplopia 12/31/2023   Hyperlipidemia 12/19/2023   Stroke-like symptoms 12/17/2023   Weakness 10/05/2023   Other fatigue 10/05/2023   Hyponatremia 10/05/2023   Lower respiratory infection 09/28/2023   Flu-like symptoms 09/28/2023   Chest congestion 09/28/2023   Dog bite 09/08/2023   Need for tetanus booster 09/08/2023   Bilateral impacted cerumen 08/27/2023   Hx of completed stroke 03/27/2023   Urinary incontinence 01/18/2023   Prostate abscess 08/04/2022   Anemia of chronic disease 08/04/2022   History of recurrent UTIs 05/12/2022   Toenail fungus 07/30/2021   Deficiency anemia 10/13/2019   Persistent cough 10/13/2019   Chronic idiopathic constipation 10/13/2019    Cervical radiculopathy 01/10/2019   Left lumbar radiculopathy 12/09/2017   Finger pain, right 10/06/2017   Hearing loss 09/09/2016   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   Encounter for general adult medical examination with abnormal findings 08/28/2015   Allergic rhinitis 02/26/2015   Glaucoma 05/07/2013   Essential hypertension 05/07/2013   Seizures (HCC) 03/17/2013    Past Surgical History:  Procedure Laterality Date   ANTERIOR CERVICAL DECOMP/DISCECTOMY FUSION N/A 05/22/2017   Procedure: Anterior Cervical Discectomy Fusion - Cervical Three-Cervical Four - Cervical Four-Cervical Five;  Surgeon: Onetha Kuba, MD;  Location: [facility] OR;  Service: Neurosurgery;  Laterality: N/A;   CATARACT EXTRACTION, BILATERAL     EYE SURGERY     GLAUCOMA REPAIR  10 yrs ago   TRANSURETHRAL RESECTION OF PROSTATE N/A 08/06/2022   Procedure: TRANSURETHRAL RESECTION OF THE PROSTATE (TURP) UNROOFING PROSTATIC ABSCESS;  Surgeon: Watt Rush, MD;  Location: [facility];  Service: Urology;  Laterality: N/A;  1 HR FOR CASE       Home Medications    Prior to Admission medications  Medication Sig Start Date End Date Taking? Authorizing Provider  azelastine  (ASTELIN ) 0.1 % nasal spray Place 2 sprays into both nostrils 2 (two) times daily. Use in each nostril as directed 01/30/24  Yes Johnie Flaming A, NP  benzonatate  (TESSALON ) 100 MG capsule Take 1 capsule (100 mg total) by mouth every 8 (eight) hours. 01/30/24  Yes Johnie, Romelo Sciandra A, NP  acetaminophen  (TYLENOL ) 500 MG tablet Take 500 mg by mouth  every 6 (six) hours as needed for moderate pain or headache.     [provider]  amLODipine  (NORVASC ) 10 MG tablet Take 1 tablet (10 mg total) by mouth daily. 12/20/23   Waddell Karna LABOR, NP  aspirin  EC 81 MG tablet Take 1 tablet (81 mg total) by mouth daily. Swallow whole. 12/20/23   Waddell Karna LABOR, NP  brimonidine  (ALPHAGAN  P) 0.1 % SOLN Place 1 drop into both eyes 2 (two) times daily.     [provider]  cycloSPORINE  (RESTASIS ) 0.05 % ophthalmic emulsion Place 1 drop into both eyes 2 (two) times daily.    [provider]  latanoprost  (XALATAN ) 0.005 % ophthalmic solution Place 1 drop into both eyes at bedtime. 09/20/12   [provider]  levETIRAcetam  (KEPPRA ) 250 MG tablet Take 1 tablet (250 mg total) by mouth 2 (two) times daily. APPOINTMENT NEEDED FOR FURTHER REFILLS Patient not taking: Reported on 12/28/2023 06/04/23   Onita Duos, MD  linaclotide  (LINZESS ) 145 MCG CAPS capsule Take 1 capsule (145 mcg total) by mouth daily before breakfast. Patient not taking: Reported on 01/30/2024 08/25/23   [name] ORN, MD  lisinopril  (ZESTRIL ) 20 MG tablet TAKE 1 TABLET(20 MG) BY MOUTH DAILY 05/08/23   Rollene Almarie LABOR, MD  magnesium  hydroxide (MILK OF MAGNESIA) 400 MG/5ML suspension Take 30 mLs by mouth daily as needed for mild constipation.    [provider]  meclizine  (ANTIVERT ) 25 MG tablet Take 1 tablet (25 mg total) by mouth every 8 (eight) hours. Patient not taking: Reported on 01/30/2024 12/20/23   Waddell Karna LABOR, NP  Multiple Vitamin (MULTIVITAMIN) tablet Take 1 tablet by mouth daily.    [provider]  polyethylene glycol (MIRALAX ) 17 g packet Take 17 g by mouth daily. Patient taking differently: Take 17 g by mouth daily. Pt is taking every other day. Daily if needed for Constipation 11/15/22   Theadore Ozell HERO, MD  predniSONE  (DELTASONE ) 50 MG tablet Take 1 tablet (50 mg total) by mouth daily with breakfast. Patient not taking: Reported on 12/28/2023 12/20/23   Waddell Karna LABOR, NP  tamsulosin  (FLOMAX ) 0.4 MG CAPS capsule TAKE 1 CAPSULE(0.4 MG) BY MOUTH IN THE MORNING AND AT BEDTIME. Please schedule appointment with PCP Patient not taking: Reported on 01/30/2024 11/30/23   Rollene Almarie LABOR, MD    Family History Family History  Problem Relation Age of Onset   Stroke Mother    High blood pressure Mother    Stroke Father     High blood pressure Father    Colon cancer Brother    Esophageal cancer Neg Hx    Pancreatic cancer Neg Hx    Rectal cancer Neg Hx    Stomach cancer Neg Hx     Social History Social History[1]   Allergies   Patient has no known allergies.   Review of Systems Review of Systems  Respiratory:  Positive for cough.   Neurological:  Positive for headaches.   Per HPI  Physical Exam Triage Vital Signs ED Triage Vitals [01/30/24 0925]  Encounter Vitals Group     BP 134/68     Girls Systolic BP Percentile      Girls Diastolic BP Percentile      Boys Systolic BP Percentile      Boys Diastolic BP Percentile      Pulse Rate 68     Resp 14     Temp 98.2 F (36.8 C)     Temp Source Oral  SpO2 99 %     Weight      Height      Head Circumference      Peak Flow      Pain Score 8     Pain Loc      Pain Education      Exclude from Growth Chart    No data found.  Updated Vital Signs BP 134/68 (BP Location: Left Arm)   Pulse 68   Temp 98.2 F (36.8 C) (Oral)   Resp 14   SpO2 99%   Visual Acuity Right Eye Distance:   Left Eye Distance:   Bilateral Distance:    Right Eye Near:   Left Eye Near:    Bilateral Near:     Physical Exam Vitals and nursing note reviewed.  Constitutional:      General: He is awake. He is not in acute distress.    Appearance: Normal appearance. He is well-developed and well-groomed. He is not ill-appearing.  HENT:     Right Ear: Tympanic membrane, ear canal and external ear normal.     Left Ear: Tympanic membrane, ear canal and external ear normal.     Nose: Congestion and rhinorrhea present.     Mouth/Throat:     Mouth: Mucous membranes are moist.     Pharynx: Posterior oropharyngeal erythema and postnasal drip present. No oropharyngeal exudate.     Tonsils: No tonsillar exudate.  Cardiovascular:     Rate and Rhythm: Normal rate and regular rhythm.  Pulmonary:     Effort: Pulmonary effort is normal.     Breath sounds: Normal  breath sounds.  Skin:    General: Skin is warm and dry.  Neurological:     Mental Status: He is alert.  Psychiatric:        Behavior: Behavior is cooperative.      UC Treatments / Results  Labs (all labs ordered are listed, but only abnormal results are displayed) Labs Reviewed  POC COVID19/FLU A&B COMBO    EKG   Radiology No results found.  Procedures Procedures (including critical care time)  Medications Ordered in UC Medications - No data to display  Initial Impression / Assessment and Plan / UC Course  I have reviewed the triage vital signs and the nursing notes.  Pertinent labs & imaging results that were available during my care of the patient were reviewed by me and considered in my medical decision making (see chart for details).     Patient is overall well-appearing.  Vitals are stable.  Heart and lung sounds normal.  COVID and flu testing negative.  No signs of bacterial infection on exam.  Symptoms likely viral in nature.  Prescribed Tessalon  as needed for cough.  Prescribed azelastine  nasal spray to help with nasal congestion.  Discussed over-the-counter medications for symptoms.  Discussed follow-up and return precautions. Final Clinical Impressions(s) / UC Diagnoses   Final diagnoses:  Acute cough  Viral respiratory illness     Discharge Instructions      Your COVID and flu testing were both negative today.  I believe your symptoms are likely related to a viral respiratory illness. You can take Tessalon  every 8 hours as needed for cough. You can also use azelastine  nasal spray twice daily to help with congestion. I also recommend an over-the-counter medication called Coricidin which can help with cough and congestion and is safe to take with your history of high blood pressure. Make sure you are staying hydrated and getting  plenty of rest. Follow-up with your primary care provider or return here as needed.     ED Prescriptions     Medication  Sig Dispense Auth. Provider   benzonatate  (TESSALON ) 100 MG capsule Take 1 capsule (100 mg total) by mouth every 8 (eight) hours. 21 capsule Johnie, Benjamin Casanas A, NP   azelastine  (ASTELIN ) 0.1 % nasal spray Place 2 sprays into both nostrils 2 (two) times daily. Use in each nostril as directed 30 mL Johnie Flaming A, NP      PDMP not reviewed this encounter.    [1]  Social History Tobacco Use   Smoking status: Former    Current packs/day: 0.00    Average packs/day: 1.0 packs/day    Types: Cigarettes    Quit date: 02/18/2007    Years since quitting: 16.9   Smokeless tobacco: Never   Tobacco comments:    not sure but a while ago; quit smoking and ETOH  Vaping Use   Vaping status: Never Used  Substance Use Topics   Alcohol use: No    Alcohol/week: 0.0 standard drinks of alcohol    Comment: quit in 2009   Drug use: No     Johnie Flaming A, NP 01/30/24 1045

## 2024-01-30 NOTE — Discharge Instructions (Signed)
 Your COVID and flu testing were both negative today.  I believe your symptoms are likely related to a viral respiratory illness. You can take Tessalon  every 8 hours as needed for cough. You can also use azelastine  nasal spray twice daily to help with congestion. I also recommend an over-the-counter medication called Coricidin which can help with cough and congestion and is safe to take with your history of high blood pressure. Make sure you are staying hydrated and getting plenty of rest. Follow-up with your primary care provider or return here as needed.

## 2024-02-01 ENCOUNTER — Ambulatory Visit: Admitting: Podiatry

## 2024-02-01 ENCOUNTER — Telehealth: Payer: Self-pay

## 2024-02-01 NOTE — Telephone Encounter (Signed)
 Patient called today at 9:15 am in regards to missed appointment at 8:15 am on 02/01/24. Patient states he is sick and would like to reschedule.

## 2024-02-02 ENCOUNTER — Inpatient Hospital Stay: Admitting: Neurology

## 2024-02-02 ENCOUNTER — Encounter: Payer: Self-pay | Admitting: Neurology

## 2024-02-02 NOTE — Progress Notes (Deleted)
 Patient: Evan Escobar Date of Birth: Jan 24, 1941  Reason for Visit: Follow up History from: Patient Primary Neurologist:    ASSESSMENT AND PLAN 83 y.o. year old male    HISTORY OF PRESENT ILLNESS: Today 02/02/2024  HISTORY  Admitted 12/16/2023 with sudden onset dizziness and florid horizontal nystagmus.  Given TNK.  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  Vascular risk factors: HTN (continue lisinopril , added amlodipine  10), HLD.  History of seizures, prescribed Keppra  up to 50 mg twice daily but was not taking since he had not had a seizure in a while. He was treated with steroids for possible vestibular neuritis.   -CT head no acute intercranial abnormality.  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 - MRI of the brain no acute abnormality - EEG no seizure activity - 2D echo EF 60 to 65% - A1c 5.1 - UDS negative - Aspirin  81 mg daily prior to admission, resumed    REVIEW OF SYSTEMS: Out of a complete 14 system review of symptoms, the patient complains only of the following symptoms, and all other reviewed systems are negative.  See HPI  ALLERGIES: Allergies[1]  HOME MEDICATIONS: Outpatient Medications Prior to Visit  Medication Sig Dispense Refill   acetaminophen  (TYLENOL ) 500 MG tablet Take 500 mg by mouth every 6 (six) hours as needed for moderate pain or headache.      amLODipine  (NORVASC ) 10 MG tablet Take 1 tablet (10 mg total) by mouth daily. 30 tablet 1   aspirin  EC 81 MG tablet Take 1 tablet (81 mg total) by mouth daily. Swallow whole. 30 tablet 12   azelastine  (ASTELIN ) 0.1 % nasal spray Place  2 sprays into both nostrils 2 (two) times daily. Use in each nostril as directed 30 mL 0   benzonatate  (TESSALON ) 100 MG capsule Take 1 capsule (100 mg total) by mouth every 8 (eight) hours. 21 capsule 0   brimonidine  (ALPHAGAN  P) 0.1 % SOLN Place 1 drop into both eyes 2 (two) times daily.     cycloSPORINE  (RESTASIS ) 0.05 % ophthalmic emulsion Place 1 drop into both eyes 2 (two) times daily.     latanoprost  (XALATAN ) 0.005 % ophthalmic solution Place 1 drop into both eyes at bedtime.     levETIRAcetam  (KEPPRA ) 250 MG tablet Take 1 tablet (250 mg total) by mouth 2 (two) times daily. APPOINTMENT NEEDED FOR FURTHER REFILLS (Patient not taking: Reported on 12/28/2023) 60 tablet 11   linaclotide  (LINZESS ) 145 MCG CAPS capsule Take 1 capsule (145 mcg total) by mouth daily before breakfast. (Patient not taking: Reported on 01/30/2024) 30 capsule 11   lisinopril  (ZESTRIL ) 20 MG tablet TAKE 1 TABLET(20 MG) BY MOUTH DAILY 90 tablet 3   magnesium  hydroxide (MILK OF MAGNESIA) 400 MG/5ML suspension Take 30 mLs by mouth daily as needed for mild constipation.     meclizine  (ANTIVERT ) 25 MG tablet Take 1 tablet (25 mg total) by mouth every 8 (eight) hours. (Patient not taking: Reported on 01/30/2024) 30 tablet 0   Multiple Vitamin (MULTIVITAMIN) tablet Take 1 tablet by mouth daily.     polyethylene glycol (MIRALAX ) 17 g packet Take 17 g by mouth daily. (Patient taking differently:  Take 17 g by mouth daily. Pt is taking every other day. Daily if needed for Constipation) 30 each 1   predniSONE  (DELTASONE ) 50 MG tablet Take 1 tablet (50 mg total) by mouth daily with breakfast. (Patient not taking: Reported on 12/28/2023) 3 tablet 0   tamsulosin  (FLOMAX ) 0.4 MG CAPS capsule TAKE 1 CAPSULE(0.4 MG) BY MOUTH IN THE MORNING AND AT BEDTIME. Please schedule appointment with PCP (Patient not taking: Reported on 01/30/2024) 180 capsule 0   No facility-administered medications prior to visit.    PAST MEDICAL HISTORY: Past  Medical History:  Diagnosis Date   Allergy    Arthritis    Cataract    Glaucoma    Hypertension    Normocytic anemia 09/25/2012   Polyp, sigmoid colon 11/05/2011   Seizure (HCC)    Seizures (HCC) 03/17/2013   Urinary incontinence 01/18/2023    PAST SURGICAL HISTORY: Past Surgical History:  Procedure Laterality Date   ANTERIOR CERVICAL DECOMP/DISCECTOMY FUSION N/A 05/22/2017   Procedure: Anterior Cervical Discectomy Fusion - Cervical Three-Cervical Four - Cervical Four-Cervical Five;  Surgeon: Onetha Kuba, MD;  Location: [facility] OR;  Service: Neurosurgery;  Laterality: N/A;   CATARACT EXTRACTION, BILATERAL     EYE SURGERY     GLAUCOMA REPAIR  10 yrs ago   TRANSURETHRAL RESECTION OF PROSTATE N/A 08/06/2022   Procedure: TRANSURETHRAL RESECTION OF THE PROSTATE (TURP) UNROOFING PROSTATIC ABSCESS;  Surgeon: Watt Rush, MD;  Location: [facility];  Service: Urology;  Laterality: N/A;  1 HR FOR CASE    FAMILY HISTORY: Family History  Problem Relation Age of Onset   Stroke Mother    High blood pressure Mother    Stroke Father    High blood pressure Father    Colon cancer Brother    Esophageal cancer Neg Hx    Pancreatic cancer Neg Hx    Rectal cancer Neg Hx    Stomach cancer Neg Hx     SOCIAL HISTORY: Social History   Socioeconomic History   Marital status: Married    Spouse name: [name]   Number of children: 1   Years of education: 11   Highest education level: Not on file  Occupational History   Occupation: retired  Tobacco Use   Smoking status: Former    Current packs/day: 0.00    Average packs/day: 1.0 packs/day    Types: Cigarettes    Quit date: 02/18/2007    Years since quitting: 16.9   Smokeless tobacco: Never   Tobacco comments:    not sure but a while ago; quit smoking and ETOH  Vaping Use   Vaping status: Never Used  Substance and Sexual Activity   Alcohol use: No    Alcohol/week: 0.0 standard drinks of alcohol    Comment: quit in 2009   Drug use: No   Sexual  activity: Not Currently  Other Topics Concern   Not on file  Social History Narrative   Patient lives at home with his wife [name] Patient is retired. Patient has 11 th grade education.    Caffeine- one cup of coffee daily and one soda.   Right handed.   Social Drivers of Health   Tobacco Use: Medium Risk (01/30/2024)   Patient History    Smoking Tobacco Use: Former    Smokeless Tobacco Use: Never    Passive Exposure: Not on file  Financial Resource Strain: Low Risk (09/28/2023)   Overall Financial Resource Strain (CARDIA)    Difficulty of Paying Living Expenses: Not hard at  all  Food Insecurity: No Food Insecurity (12/21/2023)   Epic    Worried About Programme Researcher, Broadcasting/film/video in the Last Year: Never true    Ran Out of Food in the Last Year: Never true  Transportation Needs: No Transportation Needs (12/21/2023)   Epic    Lack of Transportation (Medical): No    Lack of Transportation (Non-Medical): No  Physical Activity: Sufficiently Active (09/28/2023)   Exercise Vital Sign    Days of Exercise per Week: 3 days    Minutes of Exercise per Session: 60 min  Stress: No Stress Concern Present (09/28/2023)   [employer] of Occupational Health - Occupational Stress Questionnaire    Feeling of Stress: Not at all  Social Connections: Moderately Integrated (12/17/2023)   Social Connection and Isolation Panel    Frequency of Communication with Friends and Family: More than three times a week    Frequency of Social Gatherings with Friends and Family: Never    Attends Religious Services: More than 4 times per year    Active Member of [employer] or Organizations: No    Attends Banker Meetings: Never    Marital Status: Married  Catering Manager Violence: Patient Unable To Answer (12/21/2023)   Epic    Fear of Current or Ex-Partner: Patient unable to answer    Emotionally Abused: Patient unable to answer    Physically Abused: Patient unable to answer    Sexually Abused: Patient  unable to answer  Depression (PHQ2-9): Low Risk (09/28/2023)   Depression (PHQ2-9)    PHQ-2 Score: 0  Alcohol Screen: Low Risk (09/28/2023)   Alcohol Screen    Last Alcohol Screening Score (AUDIT): 0  Housing: Unknown (12/21/2023)   Epic    Unable to Pay for Housing in the Last Year: No    Number of Times Moved in the Last Year: Not on file    Homeless in the Last Year: No  Utilities: Not At Risk (12/21/2023)   Epic    Threatened with loss of utilities: No  Health Literacy: Adequate Health Literacy (09/28/2023)   B1300 Health Literacy    Frequency of need for help with medical instructions: Never    PHYSICAL EXAM  There were no vitals filed for this visit. There is no height or weight on file to calculate BMI.  Generalized: Well developed, in no acute distress  Neurological examination  Mentation: Alert oriented to time, place, history taking. Follows all commands speech and language fluent Cranial nerve II-XII: Pupils were equal round reactive to light. Extraocular movements were full, visual field were full on confrontational test. Facial sensation and strength were normal. Uvula tongue midline. Head turning and shoulder shrug  were normal and symmetric. Motor: The motor testing reveals 5 over 5 strength of all 4 extremities. Good symmetric motor tone is noted throughout.  Sensory: Sensory testing is intact to soft touch on all 4 extremities. No evidence of extinction is noted.  Coordination: Cerebellar testing reveals good finger-nose-finger and heel-to-shin bilaterally.  Gait and station: Gait is normal. Tandem gait is normal. Romberg is negative. No drift is seen.  Reflexes: Deep tendon reflexes are symmetric and normal bilaterally.   DIAGNOSTIC DATA (LABS, IMAGING, TESTING) - I reviewed patient records, labs, notes, testing and imaging myself where available.  Lab Results  Component Value Date   WBC 2.8 (L) 12/16/2023   HGB 10.2 (L) 12/16/2023   HCT 30.0 (L) 12/16/2023    MCV 87.9 12/16/2023   PLT 142 (L) 12/16/2023  Component Value Date/Time   NA 135 12/16/2023 1120   K 3.9 12/16/2023 1120   CL 98 12/16/2023 1120   CO2 24 12/16/2023 1059   GLUCOSE 103 (H) 12/16/2023 1120   BUN 15 12/16/2023 1120   CREATININE 1.10 12/16/2023 1120   CREATININE 1.28 (H) 10/13/2019 1532   CALCIUM  8.6 (L) 12/16/2023 1059   PROT 6.8 12/16/2023 1059   ALBUMIN 3.9 12/16/2023 1059   AST 19 12/16/2023 1059   ALT 10 12/16/2023 1059   ALKPHOS 26 (L) 12/16/2023 1059   BILITOT 0.7 12/16/2023 1059   GFRNONAA >60 12/16/2023 1059   GFRNONAA 53 (L) 10/13/2019 1532   GFRAA 62 10/13/2019 1532   Lab Results  Component Value Date   CHOL 178 12/17/2023   HDL 73 12/17/2023   LDLCALC 100 (H) 12/17/2023   TRIG 23 12/17/2023   CHOLHDL 2.4 12/17/2023   Lab Results  Component Value Date   HGBA1C 5.1 12/16/2023   Lab Results  Component Value Date   VITAMINB12 782 01/06/2023   Lab Results  Component Value Date   TSH 0.96 10/13/2019    Lauraine Born, AGNP-C, DNP 02/02/2024, 5:24 AM [facility] [address] [place], [place] [address] [phone]      [1] No Known Allergies

## 2024-02-05 ENCOUNTER — Ambulatory Visit: Admitting: Podiatry

## 2024-02-05 ENCOUNTER — Encounter (INDEPENDENT_AMBULATORY_CARE_PROVIDER_SITE_OTHER): Payer: Medicare Other | Admitting: Ophthalmology

## 2024-02-05 DIAGNOSIS — M79675 Pain in left toe(s): Secondary | ICD-10-CM | POA: Diagnosis not present

## 2024-02-05 DIAGNOSIS — M79674 Pain in right toe(s): Secondary | ICD-10-CM | POA: Diagnosis not present

## 2024-02-05 DIAGNOSIS — B351 Tinea unguium: Secondary | ICD-10-CM

## 2024-02-05 NOTE — Progress Notes (Signed)
 This patient presents to the office with chief complaint of long thick painful nails.  Patient says the nails are painful walking and wearing shoes.  This patient is unable to self treat.  This patient is unable to trim his  nails since he is unable to reach his nails.  he presents to the office for preventative foot care services.  General Appearance  Alert, conversant and in no acute stress.  Vascular  Dorsalis pedis and posterior tibial  pulses are palpable  bilaterally.  Capillary return is within normal limits  bilaterally. Temperature is within normal limits  bilaterally.  Neurologic  Senn-Weinstein monofilament wire test within normal limits  bilaterally. Muscle power within normal limits bilaterally.  Nails Thick disfigured discolored nails with subungual debris  from hallux to fifth toes bilaterally. No evidence of bacterial infection or drainage bilaterally.  Orthopedic  No limitations of motion  feet .  No crepitus or effusions noted.  HAV  B/L.  Hammer toe  B/L.  Skin  normotropic skin with no porokeratosis noted bilaterally.  No signs of infections or ulcers noted.     Onychomycosis  Nails  B/L.  Pain in right toes  Pain in left toes  Debridement of nails both feet followed trimming the nails with dremel tool.  Toe cap  dispensed second toe  B/L   RTC 4 months.   Cordella Bold DPM

## 2024-02-12 ENCOUNTER — Ambulatory Visit: Payer: Self-pay

## 2024-02-12 NOTE — Telephone Encounter (Signed)
 FYI Only or Action Required?: FYI only for provider: appointment scheduled on 02/15/24.  Patient was last seen in primary care on 12/28/2023 by Rollene Almarie LABOR, MD.  Called Nurse Triage reporting Otalgia.  Symptoms began several days ago.  Interventions attempted: Nothing.  Symptoms are: unchanged.  Triage Disposition: Home Care  Patient/caregiver understands and will follow disposition?: Yes   Reason for Disposition  Earache lasts < 60 minutes  Answer Assessment - Initial Assessment Questions [name] (Pt's wife) calling to report the pt's ear is bothering him, onset 02/10/24 - pain in the ear that comes and goes, no discharge coming out of the ear. Pt just getting over a cold - was not on abx. Has not been taking OTC medication for the ear pain but has been taking OTC cough medicine - Robitussin for patient's cold symptoms.   [name] states the patient has an unsteady gate ever since he came home from the hospital months ago.   1. LOCATION: Which ear is involved?     Left ear  2. ONSET: When did the ear pain start?      02/10/24 3. SEVERITY: How bad is the pain?  (Scale 1-10; mild, moderate or severe)     mild 4. URI SYMPTOMS: Do you have a runny nose or cough?     Runny nose and cough going on for a while  5. FEVER: Do you have a fever? If Yes, ask: What is your temperature, how was it measured, and when did it start?     Denies  6. CAUSE: Have you been swimming recently?, How often do you use Q-TIPS?, Have you had any recent air travel or scuba diving?     No objects in the ear  7. OTHER SYMPTOMS: Do you have any other symptoms? (e.g., decreased hearing, dizziness, headache, stiff neck, vomiting)     Denies  Protocols used: Earache-A-AH  Copied from CRM #[id number]. Topic: Clinical - Red Word Triage >> Feb 12, 2024  2:21 PM Harlene ORN wrote: Red Word that prompted transfer to Nurse Triage: left ear is giving him problems; developing an infection. Loses  his balance sometimes

## 2024-02-15 ENCOUNTER — Ambulatory Visit: Admitting: Family Medicine

## 2024-02-19 ENCOUNTER — Encounter: Payer: Self-pay | Admitting: Family Medicine

## 2024-02-19 ENCOUNTER — Ambulatory Visit: Admitting: Family Medicine

## 2024-02-19 VITALS — BP 130/70 | HR 75 | Temp 98.5°F | Ht 67.0 in | Wt 147.6 lb

## 2024-02-19 DIAGNOSIS — B9689 Other specified bacterial agents as the cause of diseases classified elsewhere: Secondary | ICD-10-CM | POA: Diagnosis not present

## 2024-02-19 DIAGNOSIS — J069 Acute upper respiratory infection, unspecified: Secondary | ICD-10-CM

## 2024-02-19 MED ORDER — AMOXICILLIN-POT CLAVULANATE 875-125 MG PO TABS: 1.0000 | ORAL_TABLET | Freq: Two times a day (BID) | ORAL | 0 refills | Status: AC

## 2024-02-19 NOTE — Progress Notes (Signed)
 "  Acute Office Visit  Subjective:     Patient ID: Evan Escobar, male    DOB: 1940/12/12, 84 y.o.   MRN: [id number]  Chief Complaint  Patient presents with   Acute Visit    Bilateral ears, feeling stopped up. Ongoing for about a week    HPI  Discussed the use of AI scribe software for clinical note transcription with the patient, who gave verbal consent to proceed.  History of Present Illness Evan Escobar is an 84 year old male who presents with a persistent cough and ear discomfort. He is accompanied by his wife who provides history Cough - Persistent cough initially productive, now less so - Partial relief with over-the-counter cough medicine - No fever, upset stomach, or sick contacts at home  Ear symptoms - Ear pain has resolved - Ongoing difficulty hearing  Recent evaluation and treatment - Prior urgent care visit included COVID-19 and influenza testing - Did not receive antibiotics - Slight but incomplete improvement in symptoms since prior visit     ROS Per HPI      Objective:    BP 130/70 (BP Location: Left Arm, Patient Position: Sitting)   Pulse 75   Temp 98.5 F (36.9 C) (Temporal)   Ht 5' 7 (1.702 m)   Wt 147 lb 9.6 oz (67 kg)   SpO2 96%   BMI 23.12 kg/m    Physical Exam Vitals and nursing note reviewed.  Constitutional:      General: He is not in acute distress.    Comments: elderly  HENT:     Head: Normocephalic and atraumatic.     Right Ear: External ear normal.     Left Ear: External ear normal.     Nose: Congestion present.     Mouth/Throat:     Mouth: Mucous membranes are moist.     Comments: Oropharyngeal cobblestoning   Eyes:     Extraocular Movements: Extraocular movements intact.  Cardiovascular:     Rate and Rhythm: Normal rate and regular rhythm.     Pulses: Normal pulses.     Heart sounds: Normal heart sounds.  Pulmonary:     Effort: Pulmonary effort is normal. No respiratory distress.     Breath sounds: No  wheezing, rhonchi or rales.  Musculoskeletal:     Right lower leg: No edema.     Left lower leg: No edema.     Comments: Single point cane  Lymphadenopathy:     Cervical: Cervical adenopathy present.  Skin:    General: Skin is warm and dry.  Neurological:     General: No focal deficit present.     Mental Status: He is alert and oriented to person, place, and time.  Psychiatric:        Mood and Affect: Mood normal.        Behavior: Behavior normal.     No results found for any visits on 02/19/24.      Assessment & Plan:   Assessment and Plan Assessment & Plan Bacterial upper respiratory infection Persistent cough and decreased hearing. Symptoms improved slightly post-urgent care. Likely bacterial etiology given length of illness, over 2 weeks - Prescribed Augmentin  twice daily for 7 days. - Advised to take medication with food to minimize stomach upset. - Instructed to report if symptoms do not improve after 2-3 days for potential change in treatment. - Sent prescription to pharmacy.  Cerumen impaction, bilateral Contributing to decreased hearing. - Performed ear cleaning to remove cerumen impaction.  No orders of the defined types were placed in this encounter.    Meds ordered this encounter  Medications   amoxicillin -clavulanate (AUGMENTIN ) 875-125 MG tablet    Sig: Take 1 tablet by mouth 2 (two) times daily for 7 days.    Dispense:  14 tablet    Refill:  0    Return if symptoms worsen or fail to improve.  Corean LITTIE Ku, FNP  "

## 2024-02-19 NOTE — Patient Instructions (Addendum)
 I have sent in Augmentin  for you to take twice a day for 7 days.  This medication can upset your stomach, so I tell everyone to take it with a meal.  We have cleaned out your ears in the office today.  Follow-up with me for new or worsening symptoms.

## 2024-03-22 ENCOUNTER — Telehealth: Payer: Self-pay

## 2024-03-22 NOTE — Telephone Encounter (Signed)
 Copied from CRM #[id number]. Topic: General - Other >> Mar 22, 2024  3:48 PM Deleta S wrote: Reason for CRM: requesting to speak with pcp nurse. Please contact patient regarding concerns.   Patient wife states he needs blood pressure pills

## 2024-03-24 MED ORDER — AMLODIPINE BESYLATE 10 MG PO TABS: 10.0000 mg | ORAL_TABLET | Freq: Every day | ORAL | 1 refills | Status: AC

## 2024-03-24 NOTE — Telephone Encounter (Signed)
 Spoke with pt's wife and aware rx has been sent in.

## 2024-03-24 NOTE — Addendum Note (Signed)
 Addended by: ROLLENE NORRIS A on: 03/24/2024 03:24 PM   Modules accepted: Orders

## 2024-03-24 NOTE — Telephone Encounter (Signed)
 Sent in

## 2024-05-05 ENCOUNTER — Ambulatory Visit: Admitting: Podiatry

## 2024-06-09 ENCOUNTER — Ambulatory Visit: Admitting: Neurology

## 2024-09-28 ENCOUNTER — Ambulatory Visit

## 2024-10-12 ENCOUNTER — Encounter (INDEPENDENT_AMBULATORY_CARE_PROVIDER_SITE_OTHER): Admitting: Ophthalmology
